# Patient Record
Sex: Male | Born: 1945
Health system: Southern US, Community
[De-identification: ages and names within clinical notes are randomized; demographics above are authoritative.]

## PROBLEM LIST (undated history)

## (undated) DIAGNOSIS — K922 Gastrointestinal hemorrhage, unspecified: Secondary | ICD-10-CM

## (undated) DIAGNOSIS — I10 Essential (primary) hypertension: Secondary | ICD-10-CM

## (undated) DIAGNOSIS — K7460 Unspecified cirrhosis of liver: Secondary | ICD-10-CM

## (undated) DIAGNOSIS — T7840XA Allergy, unspecified, initial encounter: Secondary | ICD-10-CM

## (undated) DIAGNOSIS — E119 Type 2 diabetes mellitus without complications: Secondary | ICD-10-CM

## (undated) DIAGNOSIS — D649 Anemia, unspecified: Secondary | ICD-10-CM

## (undated) DIAGNOSIS — Z5189 Encounter for other specified aftercare: Secondary | ICD-10-CM

## (undated) DIAGNOSIS — E7800 Pure hypercholesterolemia, unspecified: Secondary | ICD-10-CM

## (undated) DIAGNOSIS — K219 Gastro-esophageal reflux disease without esophagitis: Secondary | ICD-10-CM

## (undated) DIAGNOSIS — I480 Paroxysmal atrial fibrillation: Secondary | ICD-10-CM

## (undated) HISTORY — DX: Allergy, unspecified, initial encounter: T78.40XA

## (undated) HISTORY — DX: Paroxysmal atrial fibrillation: I48.0

## (undated) HISTORY — DX: Unspecified cirrhosis of liver: K74.60

## (undated) HISTORY — DX: Gastrointestinal hemorrhage, unspecified: K92.2

## (undated) HISTORY — DX: Anemia, unspecified: D64.9

## (undated) HISTORY — DX: Encounter for other specified aftercare: Z51.89

## (undated) HISTORY — PX: CHOLECYSTECTOMY: SHX55

## (undated) HISTORY — DX: Essential (primary) hypertension: I10

---

## 1998-03-01 ENCOUNTER — Emergency Department (HOSPITAL_COMMUNITY): Admission: EM | Admit: 1998-03-01 | Discharge: 1998-03-01 | Payer: Self-pay | Admitting: Family Medicine

## 2001-11-16 ENCOUNTER — Encounter: Payer: Self-pay | Admitting: Family Medicine

## 2001-11-16 ENCOUNTER — Ambulatory Visit (HOSPITAL_COMMUNITY): Admission: RE | Admit: 2001-11-16 | Discharge: 2001-11-16 | Payer: Self-pay | Admitting: Family Medicine

## 2001-12-14 ENCOUNTER — Ambulatory Visit (HOSPITAL_COMMUNITY): Admission: RE | Admit: 2001-12-14 | Discharge: 2001-12-14 | Payer: Self-pay | Admitting: Internal Medicine

## 2004-11-08 ENCOUNTER — Ambulatory Visit: Payer: Self-pay | Admitting: Orthopedic Surgery

## 2004-11-08 ENCOUNTER — Emergency Department (HOSPITAL_COMMUNITY): Admission: EM | Admit: 2004-11-08 | Discharge: 2004-11-08 | Payer: Self-pay | Admitting: Emergency Medicine

## 2004-11-13 ENCOUNTER — Ambulatory Visit: Payer: Self-pay | Admitting: Orthopedic Surgery

## 2004-11-20 ENCOUNTER — Ambulatory Visit: Payer: Self-pay | Admitting: Orthopedic Surgery

## 2004-12-04 ENCOUNTER — Ambulatory Visit: Payer: Self-pay | Admitting: Orthopedic Surgery

## 2005-08-28 ENCOUNTER — Emergency Department (HOSPITAL_COMMUNITY): Admission: EM | Admit: 2005-08-28 | Discharge: 2005-08-28 | Payer: Self-pay | Admitting: Emergency Medicine

## 2008-08-24 ENCOUNTER — Encounter: Payer: Self-pay | Admitting: Internal Medicine

## 2013-05-02 ENCOUNTER — Other Ambulatory Visit: Payer: Self-pay

## 2013-05-02 ENCOUNTER — Telehealth: Payer: Self-pay

## 2013-05-02 DIAGNOSIS — Z1211 Encounter for screening for malignant neoplasm of colon: Secondary | ICD-10-CM

## 2013-05-03 ENCOUNTER — Encounter (HOSPITAL_COMMUNITY): Payer: Self-pay | Admitting: Pharmacy Technician

## 2013-05-03 NOTE — Telephone Encounter (Signed)
Day of prep. Metformin 500mg  BID. Ok to schedule.

## 2013-05-03 NOTE — Telephone Encounter (Signed)
Gastroenterology Pre-Procedure Review  Request Date: 05/02/2013 Requesting Physician: Dr. Phillips Odor  PATIENT REVIEW QUESTIONS: The patient responded to the following health history questions as indicated:    1. Diabetes Melitis: YES 2. Joint replacements in the past 12 months: no 3. Major health problems in the past 3 months: no 4. Has an artificial valve or MVP: no 5. Has a defibrillator: no 6. Has been advised in past to take antibiotics in advance of a procedure like teeth cleaning: no    MEDICATIONS & ALLERGIES:    Patient reports the following regarding taking any blood thinners:   Plavix? no Aspirin? YES Coumadin? no  Patient confirms/reports the following medications:  Current Outpatient Prescriptions  Medication Sig Dispense Refill  . amLODipine-benazepril (LOTREL) 10-20 MG per capsule Take 1 capsule by mouth daily.      Marland Kitchen aspirin 81 MG tablet Take 81 mg by mouth daily.      Marland Kitchen CINNAMON PO Take by mouth.      . metFORMIN (GLUCOPHAGE) 500 MG tablet Take 1,000 mg by mouth 2 (two) times daily with a meal.      . Multiple Vitamin (MULTIVITAMIN) capsule Take 1 capsule by mouth daily.      . NON FORMULARY Omega red    Once daily      . omeprazole (PRILOSEC) 20 MG capsule Take 20 mg by mouth daily.      . simvastatin (ZOCOR) 40 MG tablet Take 40 mg by mouth every evening.       No current facility-administered medications for this visit.    Patient confirms/reports the following allergies:  No Known Allergies  No orders of the defined types were placed in this encounter.    AUTHORIZATION INFORMATION Primary Insurance:   ID #:   Group #:  Pre-Cert / Auth required:  Pre-Cert / Auth #:   Secondary Insurance:   ID #:   Group #:  Pre-Cert / Auth required:  Pre-Cert / Auth #:   SCHEDULE INFORMATION: Procedure has been scheduled as follows:  Date: 05/13/2013      Time:  8:30 AM Location: [facility] Short Stay  This Gastroenterology Pre-Precedure Review Form is  being routed to the following provider(s): R. Roetta Sessions, MD

## 2013-05-04 MED ORDER — PEG-KCL-NACL-NASULF-NA ASC-C 100 G PO SOLR: 1.0000 | ORAL | Status: DC

## 2013-05-04 NOTE — Telephone Encounter (Signed)
Rx sent to the pharmacy and instructions mailed to pt.  

## 2013-05-13 ENCOUNTER — Encounter (HOSPITAL_COMMUNITY)
Admission: RE | Disposition: A | Discharge status: Home / Self Care | Payer: Self-pay | Source: Ambulatory Visit | Attending: Internal Medicine

## 2013-05-13 ENCOUNTER — Ambulatory Visit (HOSPITAL_COMMUNITY)
Admission: RE | Admit: 2013-05-13 | Discharge: 2013-05-13 | Disposition: A | Discharge status: Home / Self Care | Payer: Medicare Other | Source: Ambulatory Visit | Attending: Internal Medicine | Admitting: Internal Medicine

## 2013-05-13 ENCOUNTER — Encounter (HOSPITAL_COMMUNITY): Payer: Self-pay | Admitting: *Deleted

## 2013-05-13 DIAGNOSIS — E119 Type 2 diabetes mellitus without complications: Secondary | ICD-10-CM | POA: Insufficient documentation

## 2013-05-13 DIAGNOSIS — Z1211 Encounter for screening for malignant neoplasm of colon: Secondary | ICD-10-CM

## 2013-05-13 DIAGNOSIS — Z01812 Encounter for preprocedural laboratory examination: Secondary | ICD-10-CM | POA: Insufficient documentation

## 2013-05-13 DIAGNOSIS — I10 Essential (primary) hypertension: Secondary | ICD-10-CM | POA: Insufficient documentation

## 2013-05-13 HISTORY — DX: Pure hypercholesterolemia, unspecified: E78.00

## 2013-05-13 HISTORY — PX: COLONOSCOPY: SHX5424

## 2013-05-13 HISTORY — DX: Gastro-esophageal reflux disease without esophagitis: K21.9

## 2013-05-13 SURGERY — COLONOSCOPY
Anesthesia: Moderate Sedation

## 2013-05-13 MED ORDER — ONDANSETRON HCL 4 MG/2ML IJ SOLN
INTRAMUSCULAR | Status: DC | PRN
  Administered 2013-05-13: 4 mg via INTRAVENOUS

## 2013-05-13 MED ORDER — SODIUM CHLORIDE 0.9 % IV SOLN
INTRAVENOUS | Status: DC
  Administered 2013-05-13: 08:00:00 via INTRAVENOUS

## 2013-05-13 MED ORDER — ONDANSETRON HCL 4 MG/2ML IJ SOLN
INTRAMUSCULAR | Status: AC
  Filled 2013-05-13: qty 2

## 2013-05-13 MED ORDER — MEPERIDINE HCL 100 MG/ML IJ SOLN
INTRAMUSCULAR | Status: DC | PRN
  Administered 2013-05-13: 50 mg via INTRAVENOUS
  Administered 2013-05-13: 25 mg via INTRAVENOUS
  Administered 2013-05-13: 50 mg via INTRAVENOUS

## 2013-05-13 MED ORDER — MEPERIDINE HCL 100 MG/ML IJ SOLN
INTRAMUSCULAR | Status: AC
  Filled 2013-05-13: qty 2

## 2013-05-13 MED ORDER — MIDAZOLAM HCL 5 MG/5ML IJ SOLN
INTRAMUSCULAR | Status: DC | PRN
  Administered 2013-05-13: 1 mg via INTRAVENOUS
  Administered 2013-05-13 (×2): 2 mg via INTRAVENOUS

## 2013-05-13 MED ORDER — SIMETHICONE 40 MG/0.6ML PO SUSP
ORAL | Status: DC | PRN
  Administered 2013-05-13: 08:00:00

## 2013-05-13 MED ORDER — MIDAZOLAM HCL 5 MG/5ML IJ SOLN
INTRAMUSCULAR | Status: AC
  Filled 2013-05-13: qty 10

## 2013-05-13 NOTE — H&P (Signed)
Primary Care Physician:  Cassell Smiles., MD Primary Gastroenterologist:  Dr. Jena Gauss  Pre-Procedure History & Physical: HPI:  Derek Drake is a 67 y.o. male is here for a screening colonoscopy. Last colonoscopy-negative reportedly 10 years ago. No bowel symptoms. In contradistinction to what is noted in the past medical history, patient does not have a family history of colon cancer. His brother had prostate and not colon cancer as patient reports.  Past Medical History  Diagnosis Date  . GERD (gastroesophageal reflux disease)   . Hypertension   . Hypercholesteremia   . Diabetes mellitus without complication     Past Surgical History  Procedure Laterality Date  . Cholecystectomy      Prior to Admission medications   Medication Sig Start Date End Date Taking? Authorizing Provider  amLODipine-benazepril (LOTREL) 10-20 MG per capsule Take 1 capsule by mouth daily.   Yes Historical Provider, MD  aspirin 81 MG tablet Take 81 mg by mouth daily.   Yes Historical Provider, MD  CINNAMON PO Take 1 capsule by mouth daily.    Yes Historical Provider, MD  metFORMIN (GLUCOPHAGE) 500 MG tablet Take 1,000 mg by mouth 2 (two) times daily with a meal.   Yes Historical Provider, MD  Multiple Vitamin (MULTIVITAMIN) capsule Take 1 capsule by mouth daily.   Yes Historical Provider, MD  NON FORMULARY Omega red    Once daily   Yes Historical Provider, MD  omeprazole (PRILOSEC) 20 MG capsule Take 20 mg by mouth daily.   Yes Historical Provider, MD  peg 3350 powder (MOVIPREP) 100 G SOLR Take 1 kit (200 g total) by mouth as directed. 05/04/13  Yes Corbin Ade, MD  simvastatin (ZOCOR) 40 MG tablet Take 40 mg by mouth every evening.   Yes Historical Provider, MD    Allergies as of 05/02/2013  . (No Known Allergies)    Family History  Problem Relation Age of Onset  . Colon cancer Brother     History   Social History  . Marital Status: Married    Spouse Name: N/A    Number of Children: N/A  .  Years of Education: N/A   Occupational History  . Not on file.   Social History Main Topics  . Smoking status: Former Smoker -- 2.00 packs/day for 10 years    Types: Cigarettes  . Smokeless tobacco: Not on file  . Alcohol Use: Yes     Comment: Occasional  . Drug Use: No  . Sexual Activity: Not on file   Other Topics Concern  . Not on file   Social History Narrative  . No narrative on file    Review of Systems: See HPI, otherwise negative ROS  Physical Exam: BP 169/82  Pulse 78  Temp(Src) 97.6 F (36.4 C) (Oral)  Resp 12  Ht 5\' 11"  (1.803 m)  Wt 200 lb (90.719 kg)  BMI 27.91 kg/m2  SpO2 97% General:   Alert,  Well-developed, well-nourished, pleasant and cooperative in NAD Head:  Normocephalic and atraumatic. Eyes:  Sclera clear, no icterus.   Conjunctiva pink. Ears:  Normal auditory acuity. Nose:  No deformity, discharge,  or lesions. Mouth:  No deformity or lesions, dentition normal. Neck:  Supple; no masses or thyromegaly. Lungs:  Clear throughout to auscultation.   No wheezes, crackles, or rhonchi. No acute distress. Heart:  Regular rate and rhythm; no murmurs, clicks, rubs,  or gallops. Abdomen:  Soft, nontender and nondistended. No masses, hepatosplenomegaly or hernias noted. Normal bowel sounds, without  guarding, and without rebound.   Msk:  Symmetrical without gross deformities. Normal posture. Pulses:  Normal pulses noted. Extremities:  Without clubbing or edema. Neurologic:  Alert and  oriented x4;  grossly normal neurologically. Skin:  Intact without significant lesions or rashes. Cervical Nodes:  No significant cervical adenopathy. Psych:  Alert and cooperative. Normal mood and affect.  Impression/Plan: [name] is now here to undergo a screening colonoscopy.  Average risk screening examination.  Risks, benefits, limitations, imponderables and alternatives regarding colonoscopy have been reviewed with the patient. Questions have been answered. All  parties agreeable.

## 2013-05-13 NOTE — Op Note (Signed)
[facility] [address] [place], [address]   COLONOSCOPY PROCEDURE REPORT  PATIENT: Derek Drake, Derek Drake  MR#:         [id number] BIRTHDATE: 06-30-1946 , 67  yrs. old GENDER: Male ENDOSCOPIST: R.  Roetta Sessions, MD FACP FACG REFERRED BY:  Assunta Found, M.D. PROCEDURE DATE:  05/13/2013 PROCEDURE:     Ileocolonoscopy-screening  INDICATIONS: Average risk colorectal cancer screening examination  INFORMED CONSENT:  The risks, benefits, alternatives and imponderables including but not limited to bleeding, perforation as well as the possibility of a missed lesion have been reviewed.  The potential for biopsy, lesion removal, etc. have also been discussed.  Questions have been answered.  All parties agreeable. Please see the history and physical in the medical record for more information.  MEDICATIONS: Versed 5 mg IV and Demerol 125 mg IV in divided doses. Zofran 4 mg IV.  DESCRIPTION OF PROCEDURE:  After a digital rectal exam was performed, the EC-3890Li ([id number])  colonoscope was advanced from the anus through the rectum and colon to the area of the cecum, ileocecal valve and appendiceal orifice.  The cecum was deeply intubated.  These structures were well-seen and photographed for the record.  From the level of the cecum and ileocecal valve, the scope was slowly and cautiously withdrawn.  The mucosal surfaces were carefully surveyed utilizing scope tip deflection to facilitate fold flattening as needed.  The scope was pulled down into the rectum where a thorough examination including retroflexion was performed.    FINDINGS:  Adequate preparation. Normal rectum. Normal-appearing colonic mucosa. Normal distal 5 cm of terminal ileal mucosa.  THERAPEUTIC / DIAGNOSTIC MANEUVERS PERFORMED:  None  COMPLICATIONS: none  CECAL WITHDRAWAL TIME:  10 minutes  IMPRESSION:  Normal ileocolonoscopy  RECOMMENDATIONS: Consider one more screening colonoscopy in 10 years if  overall health permits.   _______________________________ eSigned:  R. Roetta Sessions, MD FACP [facility] 05/13/2013 9:05 AM   CC:

## 2013-05-17 ENCOUNTER — Encounter (HOSPITAL_COMMUNITY): Payer: Self-pay | Admitting: Internal Medicine

## 2014-05-04 ENCOUNTER — Encounter (HOSPITAL_COMMUNITY): Payer: Self-pay | Admitting: Emergency Medicine

## 2014-05-04 ENCOUNTER — Inpatient Hospital Stay (HOSPITAL_COMMUNITY)
Admission: EM | Admit: 2014-05-04 | Discharge: 2014-05-05 | DRG: 310 | Disposition: A | Discharge status: Home / Self Care | Payer: Medicare Other | Attending: Family Medicine | Admitting: Family Medicine

## 2014-05-04 ENCOUNTER — Emergency Department (HOSPITAL_COMMUNITY): Payer: Medicare Other

## 2014-05-04 DIAGNOSIS — I4891 Unspecified atrial fibrillation: Secondary | ICD-10-CM | POA: Diagnosis present

## 2014-05-04 DIAGNOSIS — E7800 Pure hypercholesterolemia, unspecified: Secondary | ICD-10-CM | POA: Diagnosis present

## 2014-05-04 DIAGNOSIS — E119 Type 2 diabetes mellitus without complications: Secondary | ICD-10-CM | POA: Diagnosis present

## 2014-05-04 DIAGNOSIS — Z87891 Personal history of nicotine dependence: Secondary | ICD-10-CM | POA: Diagnosis not present

## 2014-05-04 DIAGNOSIS — K3189 Other diseases of stomach and duodenum: Secondary | ICD-10-CM | POA: Diagnosis present

## 2014-05-04 DIAGNOSIS — R079 Chest pain, unspecified: Secondary | ICD-10-CM

## 2014-05-04 DIAGNOSIS — K219 Gastro-esophageal reflux disease without esophagitis: Secondary | ICD-10-CM | POA: Diagnosis present

## 2014-05-04 DIAGNOSIS — Z8042 Family history of malignant neoplasm of prostate: Secondary | ICD-10-CM | POA: Diagnosis not present

## 2014-05-04 DIAGNOSIS — I480 Paroxysmal atrial fibrillation: Secondary | ICD-10-CM

## 2014-05-04 DIAGNOSIS — D696 Thrombocytopenia, unspecified: Secondary | ICD-10-CM

## 2014-05-04 DIAGNOSIS — R7401 Elevation of levels of liver transaminase levels: Secondary | ICD-10-CM

## 2014-05-04 DIAGNOSIS — Z7982 Long term (current) use of aspirin: Secondary | ICD-10-CM | POA: Diagnosis not present

## 2014-05-04 DIAGNOSIS — R740 Nonspecific elevation of levels of transaminase and lactic acid dehydrogenase [LDH]: Secondary | ICD-10-CM

## 2014-05-04 DIAGNOSIS — R7402 Elevation of levels of lactic acid dehydrogenase (LDH): Secondary | ICD-10-CM

## 2014-05-04 DIAGNOSIS — E1169 Type 2 diabetes mellitus with other specified complication: Secondary | ICD-10-CM

## 2014-05-04 DIAGNOSIS — E785 Hyperlipidemia, unspecified: Secondary | ICD-10-CM | POA: Diagnosis present

## 2014-05-04 DIAGNOSIS — F411 Generalized anxiety disorder: Secondary | ICD-10-CM | POA: Diagnosis present

## 2014-05-04 DIAGNOSIS — I10 Essential (primary) hypertension: Secondary | ICD-10-CM | POA: Diagnosis present

## 2014-05-04 DIAGNOSIS — R1013 Epigastric pain: Secondary | ICD-10-CM

## 2014-05-04 HISTORY — DX: Type 2 diabetes mellitus without complications: E11.9

## 2014-05-04 HISTORY — DX: Paroxysmal atrial fibrillation: I48.0

## 2014-05-04 HISTORY — DX: Essential (primary) hypertension: I10

## 2014-05-04 LAB — LIPASE, BLOOD: Lipase: 35 U/L (ref 11–59)

## 2014-05-04 LAB — CBC WITH DIFFERENTIAL/PLATELET
Basophils Absolute: 0 10*3/uL (ref 0.0–0.1)
Basophils Relative: 1 % (ref 0–1)
EOS ABS: 0.2 10*3/uL (ref 0.0–0.7)
Eosinophils Relative: 3 % (ref 0–5)
HCT: 43.2 % (ref 39.0–52.0)
Hemoglobin: 15.4 g/dL (ref 13.0–17.0)
LYMPHS ABS: 1.7 10*3/uL (ref 0.7–4.0)
LYMPHS PCT: 24 % (ref 12–46)
MCH: 29.6 pg (ref 26.0–34.0)
MCHC: 35.6 g/dL (ref 30.0–36.0)
MCV: 83.1 fL (ref 78.0–100.0)
Monocytes Absolute: 0.7 10*3/uL (ref 0.1–1.0)
Monocytes Relative: 9 % (ref 3–12)
NEUTROS ABS: 4.6 10*3/uL (ref 1.7–7.7)
NEUTROS PCT: 64 % (ref 43–77)
PLATELETS: 119 10*3/uL — AB (ref 150–400)
RBC: 5.2 MIL/uL (ref 4.22–5.81)
RDW: 13.5 % (ref 11.5–15.5)
WBC: 7.2 10*3/uL (ref 4.0–10.5)

## 2014-05-04 LAB — COMPREHENSIVE METABOLIC PANEL
ALBUMIN: 4.9 g/dL (ref 3.5–5.2)
ALK PHOS: 77 U/L (ref 39–117)
ALT: 65 U/L — AB (ref 0–53)
AST: 48 U/L — ABNORMAL HIGH (ref 0–37)
Anion gap: 16 — ABNORMAL HIGH (ref 5–15)
BUN: 12 mg/dL (ref 6–23)
CHLORIDE: 100 meq/L (ref 96–112)
CO2: 25 mEq/L (ref 19–32)
Calcium: 10 mg/dL (ref 8.4–10.5)
Creatinine, Ser: 0.87 mg/dL (ref 0.50–1.35)
GFR calc non Af Amer: 87 mL/min — ABNORMAL LOW (ref >90)
GLUCOSE: 195 mg/dL — AB (ref 70–99)
POTASSIUM: 4 meq/L (ref 3.7–5.3)
SODIUM: 141 meq/L (ref 137–147)
TOTAL PROTEIN: 8.1 g/dL (ref 6.0–8.3)
Total Bilirubin: 0.6 mg/dL (ref 0.3–1.2)

## 2014-05-04 LAB — GLUCOSE, CAPILLARY
GLUCOSE-CAPILLARY: 226 mg/dL — AB (ref 70–99)
Glucose-Capillary: 169 mg/dL — ABNORMAL HIGH (ref 70–99)
Glucose-Capillary: 203 mg/dL — ABNORMAL HIGH (ref 70–99)

## 2014-05-04 LAB — PROTIME-INR
INR: 1.01 (ref 0.00–1.49)
PROTHROMBIN TIME: 13.3 s (ref 11.6–15.2)

## 2014-05-04 LAB — MRSA PCR SCREENING: MRSA by PCR: NEGATIVE

## 2014-05-04 LAB — TROPONIN I: Troponin I: <0.3 ng/mL (ref <0.30)

## 2014-05-04 MED ORDER — ACETAMINOPHEN 325 MG PO TABS: 650.0000 mg | ORAL_TABLET | Freq: Four times a day (QID) | ORAL | Status: DC | PRN

## 2014-05-04 MED ORDER — INSULIN ASPART 100 UNIT/ML ~~LOC~~ SOLN
0.0000 [IU] | Freq: Three times a day (TID) | SUBCUTANEOUS | Status: DC
  Administered 2014-05-04: 2 [IU] via SUBCUTANEOUS
  Administered 2014-05-04: 3 [IU] via SUBCUTANEOUS
  Administered 2014-05-05: 2 [IU] via SUBCUTANEOUS
  Administered 2014-05-05: 5 [IU] via SUBCUTANEOUS
  Administered 2014-05-05: 3 [IU] via SUBCUTANEOUS

## 2014-05-04 MED ORDER — DILTIAZEM LOAD VIA INFUSION
10.0000 mg | Freq: Once | INTRAVENOUS | Status: AC
  Administered 2014-05-04: 10 mg via INTRAVENOUS
  Filled 2014-05-04: qty 10

## 2014-05-04 MED ORDER — ATORVASTATIN CALCIUM 20 MG PO TABS
20.0000 mg | ORAL_TABLET | Freq: Every day | ORAL | Status: DC
  Administered 2014-05-04 – 2014-05-05 (×2): 20 mg via ORAL
  Filled 2014-05-04 (×2): qty 1

## 2014-05-04 MED ORDER — SODIUM CHLORIDE 0.9 % IJ SOLN
3.0000 mL | Freq: Two times a day (BID) | INTRAMUSCULAR | Status: DC
  Administered 2014-05-04 – 2014-05-05 (×2): 3 mL via INTRAVENOUS

## 2014-05-04 MED ORDER — ONDANSETRON HCL 4 MG PO TABS: 4.0000 mg | ORAL_TABLET | Freq: Four times a day (QID) | ORAL | Status: DC | PRN

## 2014-05-04 MED ORDER — ONDANSETRON HCL 4 MG/2ML IJ SOLN: 4.0000 mg | Freq: Four times a day (QID) | INTRAMUSCULAR | Status: DC | PRN

## 2014-05-04 MED ORDER — ENOXAPARIN SODIUM 100 MG/ML ~~LOC~~ SOLN
1.0000 mg/kg | Freq: Two times a day (BID) | SUBCUTANEOUS | Status: DC
  Administered 2014-05-04 – 2014-05-05 (×3): 90 mg via SUBCUTANEOUS
  Filled 2014-05-04 (×3): qty 1

## 2014-05-04 MED ORDER — ASPIRIN EC 81 MG PO TBEC
81.0000 mg | DELAYED_RELEASE_TABLET | Freq: Every day | ORAL | Status: DC
  Administered 2014-05-05: 81 mg via ORAL
  Filled 2014-05-04: qty 1

## 2014-05-04 MED ORDER — PANTOPRAZOLE SODIUM 40 MG PO TBEC
40.0000 mg | DELAYED_RELEASE_TABLET | Freq: Every day | ORAL | Status: DC
  Administered 2014-05-04 – 2014-05-05 (×2): 40 mg via ORAL
  Filled 2014-05-04 (×2): qty 1

## 2014-05-04 MED ORDER — ALUM & MAG HYDROXIDE-SIMETH 200-200-20 MG/5ML PO SUSP: 30.0000 mL | Freq: Four times a day (QID) | ORAL | Status: DC | PRN

## 2014-05-04 MED ORDER — SIMVASTATIN 20 MG PO TABS: 40.0000 mg | ORAL_TABLET | Freq: Every evening | ORAL | Status: DC

## 2014-05-04 MED ORDER — GI COCKTAIL ~~LOC~~
30.0000 mL | Freq: Once | ORAL | Status: AC
  Administered 2014-05-04: 30 mL via ORAL
  Filled 2014-05-04: qty 30

## 2014-05-04 MED ORDER — DEXTROSE 5 % IV SOLN
5.0000 mg/h | INTRAVENOUS | Status: DC
  Administered 2014-05-04: 20 mg/h via INTRAVENOUS
  Administered 2014-05-04: 5 mg/h via INTRAVENOUS
  Administered 2014-05-04 – 2014-05-05 (×3): 20 mg/h via INTRAVENOUS
  Filled 2014-05-04: qty 100

## 2014-05-04 MED ORDER — ACETAMINOPHEN 650 MG RE SUPP
650.0000 mg | Freq: Four times a day (QID) | RECTAL | Status: DC | PRN
Start: 2014-05-04 — End: 2014-05-05

## 2014-05-04 MED ORDER — ASPIRIN 81 MG PO CHEW
324.0000 mg | CHEWABLE_TABLET | Freq: Once | ORAL | Status: AC
  Administered 2014-05-04: 324 mg via ORAL
  Filled 2014-05-04: qty 4

## 2014-05-04 NOTE — ED Notes (Signed)
Pt denies any pain. Pt states he feels like himself except a lot of burping. NAD. Non diaphoretic. HR in 160s AF. Pt is asymptomatic.

## 2014-05-04 NOTE — Progress Notes (Signed)
UR chart review completed.  

## 2014-05-04 NOTE — H&P (Signed)
History and Physical  Derek Drake ZGY:[id number] DOB: 1946-03-06 DOA: 05/04/2014  Referring physician: Dr. Ezequiel Essex in ED PCP: [name]., MD   Chief Complaint: burping  HPI:  68 year old male with history of diabetes and hypertension who came to the emergency department for increased belching, anxiety. Found to have atrial fibrillation with rapid ventricular response.  He has a history of "heart skipping a beat" but no diagnosed cardiac issues. No recent chest pain or shortness of breath. He has chronic belching from GERD. If help in yes last night this morning 4 a.m. he had severe belching and feeling of anxiety which prompted him to seek treatment. In the emergency department he was treated with GI cocktail and aspirin. He now feels "wonderful". No chest pain or shortness of breath. No radiation to the left arm neck or jaw. No diaphoresis. No vomiting.  In the emergency department afebrile. Heart rate up to 160s. Normotensive. No hypoxia. Treated with aspirin, GI cocktail, diltiazem infusion. Laboratory studies notable for mild elevation of transaminases, platelet count 119. Troponin negative. EKG showed atrial fibrillation with rapid ventricular response with nonspecific ST changes. Independently interpreted. Chest x-ray no acute disease, independently interpreted.  Review of Systems:  Negative for fever, visual changes, sore throat, rash, new muscle aches, chest pain, SOB, dysuria, bleeding, n/v/abdominal pain.  Past Medical History  Diagnosis Date  . GERD (gastroesophageal reflux disease)   . Hypertension   . Hypercholesteremia   . Diabetes mellitus without complication     Past Surgical History  Procedure Laterality Date  . Cholecystectomy    . Colonoscopy N/A 05/13/2013    Procedure: COLONOSCOPY;  Surgeon: Daneil Dolin, MD;  Location: [facility];  Service: Endoscopy;  Laterality: N/A;  8:30 AM    Social History:  reports that he has quit smoking. His  smoking use included Cigarettes. He has a 20 pack-year smoking history. He does not have any smokeless tobacco history on file. He reports that he drinks alcohol. He reports that he does not use illicit drugs.  Allergies  Allergen Reactions  . Rabbit Protein Swelling    Face swelled up  . Other     Dial soap    Family History  Problem Relation Age of Onset  . Prostate cancer Brother   . Colon cancer Neg Hx      Prior to Admission medications   Medication Sig Start Date End Date Taking? Authorizing Provider  amLODipine-benazepril (LOTREL) 10-20 MG per capsule Take 1 capsule by mouth daily.   Yes Historical Provider, MD  aspirin 81 MG tablet Take 81 mg by mouth daily.   Yes Historical Provider, MD  CINNAMON PO Take 1 capsule by mouth daily.    Yes Historical Provider, MD  metFORMIN (GLUCOPHAGE) 500 MG tablet Take 1,000 mg by mouth 2 (two) times daily with a meal.   Yes Historical Provider, MD  Multiple Vitamin (MULTIVITAMIN) capsule Take 1 capsule by mouth daily.   Yes Historical Provider, MD  NON FORMULARY Omega red    Once daily   Yes Historical Provider, MD  omeprazole (PRILOSEC) 20 MG capsule Take 20 mg by mouth daily.   Yes Historical Provider, MD  simvastatin (ZOCOR) 40 MG tablet Take 40 mg by mouth every evening.   Yes Historical Provider, MD  peg 3350 powder (MOVIPREP) 100 G SOLR Take 1 kit (200 g total) by mouth as directed. 05/04/13   Daneil Dolin, MD   Physical Exam: Filed Vitals:   05/04/14 5916 05/04/14  0630 05/04/14 0640 05/04/14 0730  BP: 143/83 93/76 118/94 107/85  Pulse: 180 164 164 147  Temp: 98 F (36.7 C)     TempSrc: Oral     Resp: _0 Height: _1  (1.803 m)     Weight: 87.544 kg (193 lb)     SpO2: 100% 96% 94% 95%    General: Examined in the emergency department. Appears calm and comfortable. Well-appearing. Eyes: PERRL, normal lids, irises ENT: grossly normal hearing, lips & tongue Neck: no LAD, masses or thyromegaly Cardiovascular:  Tachycardic, regular. No murmur, rub or gallop. No LE edema. Respiratory: CTA bilaterally, no w/r/r. Normal respiratory effort. Abdomen: soft, ntnd Skin: no rash or induration seen  Musculoskeletal: grossly normal tone BUE/BLE Psychiatric: grossly normal mood and affect, speech fluent and appropriate Neurologic: grossly non-focal.  Wt Readings from Last 3 Encounters:  05/04/14 87.544 kg (193 lb)  05/13/13 90.719 kg (200 lb)  05/13/13 90.719 kg (200 lb)    Labs on Admission:  Basic Metabolic Panel:  Recent Labs Lab 05/04/14 0620  NA 141  K 4.0  CL 100  CO2 25  GLUCOSE 195*  BUN 12  CREATININE 0.87  CALCIUM 10.0    Liver Function Tests:  Recent Labs Lab 05/04/14 0620  AST 48*  ALT 65*  ALKPHOS 77  BILITOT 0.6  PROT 8.1  ALBUMIN 4.9    Recent Labs Lab 05/04/14 0620  LIPASE 35    CBC:  Recent Labs Lab 05/04/14 0620  WBC 7.2  NEUTROABS 4.6  HGB 15.4  HCT 43.2  MCV 83.1  PLT 119*    Cardiac Enzymes:  Recent Labs Lab 05/04/14 0620  TROPONINI <0.30      Radiological Exams on Admission: Dg Chest Portable 1 View  05/04/2014   CLINICAL DATA:  Chest pain.  EXAM: PORTABLE CHEST - 1 VIEW  COMPARISON:  None.  FINDINGS: Lungs are clear. Heart size is upper normal. There is no evidence pulmonary edema. No pneumothorax or pleural effusion.  IMPRESSION: No acute disease.   Electronically Signed   By: Inge Rise M.D.   On: 05/04/2014 07:19     Principal Problem:   Atrial fibrillation with rapid ventricular response Active Problems:   Thrombocytopenia   Transaminitis   DM type 2 (diabetes mellitus, type 2)   Hypertension   Assessment/Plan 1. Atrial fibrillation with rapid ventricular response. Hemodynamically stable. Etiology unclear, no signs or symptoms of ACS. Workup as below. 2. Mild thrombocytopenia, significance and chronicity unclear. 3. Mild transaminitis, unclear etiology, possibly statin. 4. Diabetes mellitus type  2. 5. Hypertension. 6. Hyperlipidemia.   Although heart rate remains elevated he is hemodynamically stable and appears well. Plan admission to step down unit on IV diltiazem infusion, the duration unclear as the patient has frequent belching and had no sense of palpitations.  2-D echocardiogram, TSH, serial troponin. Plan rate control.  CBC, CMP in AM  SSI  Code Status: full code  DVT prophylaxis:Lovenox Family Communication: discussed with wife at bedside Disposition Plan/Anticipated LOS: admit 2 days  Time spent: 18 minutes  Murray Hodgkins, MD  [facility] Pager [phone] 05/04/2014, 8:04 AM

## 2014-05-04 NOTE — ED Notes (Signed)
Hr slightly better in 150s/140s.

## 2014-05-04 NOTE — ED Notes (Signed)
VO to increase cardizem to 20mg /hr per Dr Wyvonnia Dusky, read back and verified.

## 2014-05-04 NOTE — ED Provider Notes (Signed)
CSN: [id number]     Arrival date & time 05/04/14  0606 History   First MD Initiated Contact with Patient 05/04/14 0622     Chief Complaint  Patient presents with  . Chest Pain     (Consider location/radiation/quality/duration/timing/severity/associated sxs/prior Treatment) HPI Comments: Patient presents with burning in his chest that has been intermittent since 4 AM. It has since resolved. The burning sensation lasted for several minutes at a time. It is associated with burping and nausea. He does admit to eating some jalapenos last night and has a history of acid reflux disease. On arrival he denies any chest pain, shortness of breath, nausea, vomiting, fevers or chills. He has a history of diabetes, hypertension. On arrival he is found to have an irregular tachycardia. Denies any dizziness or lightheadedness. States he has felt his heart "skipping beats" in the past. No history of atrial fibrillation.  The history is provided by the patient and a relative. The history is limited by the condition of the patient.    Past Medical History  Diagnosis Date  . GERD (gastroesophageal reflux disease)   . Hypertension   . Hypercholesteremia   . Diabetes mellitus without complication    Past Surgical History  Procedure Laterality Date  . Cholecystectomy    . Colonoscopy N/A 05/13/2013    Procedure: COLONOSCOPY;  Surgeon: Daneil Dolin, MD;  Location: [facility];  Service: Endoscopy;  Laterality: N/A;  8:30 AM   Family History  Problem Relation Age of Onset  . Prostate cancer Brother   . Colon cancer Neg Hx    History  Substance Use Topics  . Smoking status: Former Smoker -- 2.00 packs/day for 10 years    Types: Cigarettes  . Smokeless tobacco: Not on file  . Alcohol Use: Yes     Comment: Occasional    Review of Systems  Constitutional: Negative for fever, activity change and appetite change.  HENT: Negative for congestion and rhinorrhea.   Eyes: Negative for photophobia.   Respiratory: Positive for chest tightness. Negative for cough and shortness of breath.   Cardiovascular: Positive for chest pain and palpitations.  Gastrointestinal: Positive for nausea. Negative for vomiting and abdominal pain.  Genitourinary: Negative for dysuria and penile pain.  Musculoskeletal: Negative for arthralgias and myalgias.  Skin: Negative for wound.  Neurological: Negative for dizziness, weakness and headaches.  A complete 10 system review of systems was obtained and all systems are negative except as noted in the HPI and PMH.      Allergies  Rabbit protein and Other  Home Medications   Prior to Admission medications   Medication Sig Start Date End Date Taking? Authorizing Provider  amLODipine-benazepril (LOTREL) 10-20 MG per capsule Take 1 capsule by mouth daily.   Yes Historical Provider, MD  aspirin 81 MG tablet Take 81 mg by mouth daily.   Yes Historical Provider, MD  CINNAMON PO Take 1 capsule by mouth daily.    Yes Historical Provider, MD  metFORMIN (GLUCOPHAGE) 500 MG tablet Take 1,000 mg by mouth 2 (two) times daily with a meal.   Yes Historical Provider, MD  Multiple Vitamin (MULTIVITAMIN) capsule Take 1 capsule by mouth daily.   Yes Historical Provider, MD  NON FORMULARY Omega red    Once daily   Yes Historical Provider, MD  omeprazole (PRILOSEC) 20 MG capsule Take 20 mg by mouth daily.   Yes Historical Provider, MD  simvastatin (ZOCOR) 40 MG tablet Take 40 mg by mouth every evening.  Yes Historical Provider, MD  peg 3350 powder (MOVIPREP) 100 G SOLR Take 1 kit (200 g total) by mouth as directed. 05/04/13   Daneil Dolin, MD   BP 118/94  Pulse 164  Temp(Src) 98 F (36.7 C) (Oral)  Resp 16  Ht _0  (1.803 m)  Wt 193 lb (87.544 kg)  BMI 26.93 kg/m2  SpO2 94% Physical Exam  Nursing note and vitals reviewed. Constitutional: He is oriented to person, place, and time. He appears well-developed and well-nourished. No distress.  HENT:  Head:  Normocephalic and atraumatic.  Mouth/Throat: Oropharynx is clear and moist. No oropharyngeal exudate.  Eyes: Conjunctivae and EOM are normal. Pupils are equal, round, and reactive to light.  Neck: Normal range of motion. Neck supple.  No meningismus.  Cardiovascular: Normal rate, normal heart sounds and intact distal pulses.   No murmur heard. Irregular tachycardia.  Pulmonary/Chest: Effort normal and breath sounds normal. No respiratory distress.  Abdominal: Soft. There is no tenderness. There is no rebound and no guarding.  Musculoskeletal: Normal range of motion. He exhibits no edema and no tenderness.  Neurological: He is alert and oriented to person, place, and time. No cranial nerve deficit. He exhibits normal muscle tone. Coordination normal.  No ataxia on finger to nose bilaterally. No pronator drift. 5/5 strength throughout. CN 2-12 intact. Negative Romberg. Equal grip strength. Sensation intact. Gait is normal.   Skin: Skin is warm.  Psychiatric: He has a normal mood and affect. His behavior is normal.    ED Course  Procedures (including critical care time) Labs Review Labs Reviewed  CBC WITH DIFFERENTIAL - Abnormal; Notable for the following:    Platelets 119 (*)    All other components within normal limits  COMPREHENSIVE METABOLIC PANEL - Abnormal; Notable for the following:    Glucose, Bld 195 (*)    AST 48 (*)    ALT 65 (*)    GFR calc non Af Amer 87 (*)    Anion gap 16 (*)    All other components within normal limits  LIPASE, BLOOD  TROPONIN I  PROTIME-INR    Imaging Review Dg Chest Portable 1 View  05/04/2014   CLINICAL DATA:  Chest pain.  EXAM: PORTABLE CHEST - 1 VIEW  COMPARISON:  None.  FINDINGS: Lungs are clear. Heart size is upper normal. There is no evidence pulmonary edema. No pneumothorax or pleural effusion.  IMPRESSION: No acute disease.   Electronically Signed   By: Inge Rise M.D.   On: 05/04/2014 07:19     EKG Interpretation   Date/Time:   Thursday May 04 2014 06:22:57 EDT Ventricular Rate:  171 PR Interval:    QRS Duration: 90 QT Interval:  272 QTC Calculation: 458 R Axis:   21 Text Interpretation:  Atrial fibrillation with rapid ventricular response  Nonspecific ST and T wave abnormality Abnormal ECG No previous ECGs  available Confirmed by Wyvonnia Dusky  MD, Sophy Mesler ([id number]) on 05/04/2014 6:48:21  AM      MDM   Final diagnoses:  Atrial fibrillation with RVR  Chest pain, unspecified chest pain type   Burning in chest which has since resolved. Found to have atrial fibrillation with RVR.  New onset.  Patient given aspirin, GI cocktail. Started on Cardizem bolus and drip. Labs unremarkable. Chest x-ray clear. Troponin negative.  Rate remains rapid in 150 range. Rate of Cardizem drip increased from 10 mg to 15 mg. Patient denies chest pain, shortness of breath or palpitations. Blood pressure 107/83.  Step down admission discussed with Dr. Sarajane Jews.  CRITICAL CARE Performed by: Ezequiel Essex Total critical care time: 45 Critical care time was exclusive of separately billable procedures and treating other patients. Critical care was necessary to treat or prevent imminent or life-threatening deterioration. Critical care was time spent personally by me on the following activities: development of treatment plan with patient and/or surrogate as well as nursing, discussions with consultants, evaluation of patient's response to treatment, examination of patient, obtaining history from patient or surrogate, ordering and performing treatments and interventions, ordering and review of laboratory studies, ordering and review of radiographic studies, pulse oximetry and re-evaluation of patient's condition.     Ezequiel Essex, MD 05/04/14 [phone]

## 2014-05-04 NOTE — ED Notes (Signed)
Pt states he awoke approx 4 am with mid sternal chest pains, states he did eat some jalapenos last night and feels like it might be heartburn, states he has also been burping a lot.

## 2014-05-05 ENCOUNTER — Encounter (HOSPITAL_COMMUNITY): Payer: Self-pay | Admitting: Physician Assistant

## 2014-05-05 DIAGNOSIS — I10 Essential (primary) hypertension: Secondary | ICD-10-CM

## 2014-05-05 DIAGNOSIS — I059 Rheumatic mitral valve disease, unspecified: Secondary | ICD-10-CM

## 2014-05-05 LAB — CBC
HCT: 39.2 % (ref 39.0–52.0)
Hemoglobin: 13.7 g/dL (ref 13.0–17.0)
MCH: 28.9 pg (ref 26.0–34.0)
MCHC: 34.9 g/dL (ref 30.0–36.0)
MCV: 82.7 fL (ref 78.0–100.0)
PLATELETS: 119 10*3/uL — AB (ref 150–400)
RBC: 4.74 MIL/uL (ref 4.22–5.81)
RDW: 13.4 % (ref 11.5–15.5)
WBC: 9.5 10*3/uL (ref 4.0–10.5)

## 2014-05-05 LAB — GLUCOSE, CAPILLARY
GLUCOSE-CAPILLARY: 202 mg/dL — AB (ref 70–99)
Glucose-Capillary: 253 mg/dL — ABNORMAL HIGH (ref 70–99)
Glucose-Capillary: 168 mg/dL — ABNORMAL HIGH (ref 70–99)

## 2014-05-05 LAB — TROPONIN I: Troponin I: <0.3 ng/mL (ref <0.30)

## 2014-05-05 LAB — COMPREHENSIVE METABOLIC PANEL
ALT: 54 U/L — ABNORMAL HIGH (ref 0–53)
ANION GAP: 15 (ref 5–15)
AST: 38 U/L — ABNORMAL HIGH (ref 0–37)
Albumin: 4.1 g/dL (ref 3.5–5.2)
Alkaline Phosphatase: 61 U/L (ref 39–117)
BILIRUBIN TOTAL: 0.9 mg/dL (ref 0.3–1.2)
BUN: 11 mg/dL (ref 6–23)
CHLORIDE: 98 meq/L (ref 96–112)
CO2: 25 meq/L (ref 19–32)
CREATININE: 0.86 mg/dL (ref 0.50–1.35)
Calcium: 9.2 mg/dL (ref 8.4–10.5)
GFR calc Af Amer: >90 mL/min (ref >90)
GFR, EST NON AFRICAN AMERICAN: 87 mL/min — AB (ref >90)
GLUCOSE: 209 mg/dL — AB (ref 70–99)
POTASSIUM: 4.2 meq/L (ref 3.7–5.3)
Sodium: 138 mEq/L (ref 137–147)
Total Protein: 6.9 g/dL (ref 6.0–8.3)

## 2014-05-05 LAB — TSH: TSH: 2.26 u[IU]/mL (ref 0.350–4.500)

## 2014-05-05 MED ORDER — APIXABAN 5 MG PO TABS: 5.0000 mg | ORAL_TABLET | Freq: Two times a day (BID) | ORAL | Status: DC

## 2014-05-05 MED ORDER — DILTIAZEM HCL ER COATED BEADS 120 MG PO CP24
120.0000 mg | ORAL_CAPSULE | Freq: Every day | ORAL | Status: DC
  Administered 2014-05-05: 120 mg via ORAL
  Filled 2014-05-05: qty 1

## 2014-05-05 MED ORDER — DILTIAZEM HCL ER COATED BEADS 120 MG PO CP24: 120.0000 mg | ORAL_CAPSULE | Freq: Every day | ORAL | Status: DC

## 2014-05-05 MED ORDER — DILTIAZEM HCL 30 MG PO TABS
30.0000 mg | ORAL_TABLET | Freq: Four times a day (QID) | ORAL | Status: DC
  Administered 2014-05-05: 30 mg via ORAL
  Filled 2014-05-05: qty 1

## 2014-05-05 NOTE — Discharge Summary (Signed)
Physician Discharge Summary  Derek Drake:[id number] DOB: 1946/08/31 DOA: 05/04/2014  PCP: [name]., MD  Admit date: 05/04/2014 Discharge date: 05/05/2014  Recommendations for Outpatient Follow-up:  1. New diagnosis of atrial fibrillation, started on diltiazem and Eliquis 2. Lotrel discontinued with low normal BP and start of diltiazem. Consider starting ACE inhibitor if blood pressure can tolerate 3. Mild transaminitis of unclear etiology which appears to be subacute, possibly related to statin. Consider further evaluation. 4. Mild thrombocytopenia of unclear significance. Consider repeat CBC as an outpatient to followup   Follow-up Information   Follow up with [name], MD On 05/25/2014. (@ 1PM)    Specialty:  Cardiology   Contact information:   [place] [address] [phone]       Follow up with [name]., MD. Schedule an appointment as soon as possible for a visit in 2 weeks. (follow-up hospitalization)    Specialty:  Internal Medicine   Contact information:   [address] [place] [address] [phone]      Discharge Diagnoses:  1. New diagnosis atrial fibrillation with rapid ventricular response 2. Diabetes mellitus type 2 3. Hypertension 4. Hyperlipidemia 5. Mild transaminitis of unclear etiology 6. Mild thrombocytopenia of unclear etiology  Discharge Condition: Improved Disposition: Home  Diet recommendation: Heart healthy diabetic diet  Filed Weights   05/04/14 0622 05/04/14 0848  Weight: 87.544 kg (193 lb) 89.4 kg (197 lb 1.5 oz)    History of present illness:  68 year old male with history of diabetes, hypertension but no cardiac disease who presented with atrial fibrillation with rapid ventricular response, new diagnosis.  Hospital Course:  Derek Drake was treated with IV diltiazem and spontaneously converted to sinus rhythm. He has remained in sinus rhythm on oral diltiazem. Risks and benefits of  anticoagulation in the setting of atrial fibrillation with thoroughly discussed with the patient and after deliberation and consultation with cardiology he has elected to start Eliquis. We discussed no that there is no reversal agent for this medication and to take care with power tools. TSH and 2-D echocardiogram are unremarkable. Seen individual issues below.  1. New diagnosis atrial fibrillation with rapid ventricular response. Remains in sinus rhythm. CHADSVASC 3, Eliquis initiated. 2. Diabetes mellitus type 2. Stable. Resume metformin on discharge. 3. Hypertension. Stable. 4. Hyperlipidemia. 5. Mild transaminitis of unclear etiology, stable, per PCP 11/03/13 AST 34, ALT 54. Suspect statin. Consider followup as an outpatient. 6. Mild thrombocytopenia, stable. Significance unclear. Plt 156 on 11/03/13 per office.  Consultants:  Cardiology Procedures:  2-D echocardiogram: Mild LVH with LV EF 60-65%. Grade 1 diastolic dysfunction.  Discharge Instructions  Discharge Instructions   Activity as tolerated - No restrictions    Complete by:  As directed      Diet - low sodium heart healthy    Complete by:  As directed      Discharge instructions    Complete by:  As directed   Call your physician or seek immediate medical attention for palpitations, fast heart rate, shortness of breath, chest pain, bleeding or worsening of condition.            Medication List    STOP taking these medications       amLODipine-benazepril 10-20 MG per capsule  Commonly known as:  LOTREL     aspirin EC 81 MG tablet      TAKE these medications       apixaban 5 MG Tabs tablet  Commonly known as:  ELIQUIS  Take 1 tablet (5 mg total) by mouth 2 (two) times daily.     CINNAMON PO  Take 1 capsule by mouth daily.     diltiazem 120 MG 24 hr capsule  Commonly known as:  CARDIZEM CD  Take 1 capsule (120 mg total) by mouth daily. Start 8/22 in the evening     Krill Oil 1000 MG Caps  Take 1 capsule by  mouth daily.     metFORMIN 500 MG tablet  Commonly known as:  GLUCOPHAGE  Take 1,000 mg by mouth 2 (two) times daily with a meal.     multivitamin capsule  Take 1 capsule by mouth daily.     omeprazole 20 MG capsule  Commonly known as:  PRILOSEC  Take 20 mg by mouth daily.     simvastatin 40 MG tablet  Commonly known as:  ZOCOR  Take 40 mg by mouth every evening.       Allergies  Allergen Reactions  . Rabbit Protein Swelling    Face swelled up  . Other     Dial soap    The results of significant diagnostics from this hospitalization (including imaging, microbiology, ancillary and laboratory) are listed below for reference.    Significant Diagnostic Studies: Dg Chest Portable 1 View  05/04/2014   CLINICAL DATA:  Chest pain.  EXAM: PORTABLE CHEST - 1 VIEW  COMPARISON:  None.  FINDINGS: Lungs are clear. Heart size is upper normal. There is no evidence pulmonary edema. No pneumothorax or pleural effusion.  IMPRESSION: No acute disease.   Electronically Signed   By: Inge Rise M.D.   On: 05/04/2014 07:19    Microbiology: Recent Results (from the past 240 hour(s))  MRSA PCR SCREENING     Status: None   Collection Time    05/04/14  8:45 AM      Result Value Ref Range Status   MRSA by PCR NEGATIVE  NEGATIVE Final   Comment:            The GeneXpert MRSA Assay (FDA     approved for NASAL specimens     only), is one component of a     comprehensive MRSA colonization     surveillance program. It is not     intended to diagnose MRSA     infection nor to guide or     monitor treatment for     MRSA infections.     Labs: Basic Metabolic Panel:  Recent Labs Lab 05/04/14 0620 05/05/14 0425  NA 141 138  K 4.0 4.2  CL 100 98  CO2 25 25  GLUCOSE 195* 209*  BUN 12 11  CREATININE 0.87 0.86  CALCIUM 10.0 9.2   Liver Function Tests:  Recent Labs Lab 05/04/14 0620 05/05/14 0425  AST 48* 38*  ALT 65* 54*  ALKPHOS 77 61  BILITOT 0.6 0.9  PROT 8.1 6.9    ALBUMIN 4.9 4.1    Recent Labs Lab 05/04/14 0620  LIPASE 35   CBC:  Recent Labs Lab 05/04/14 0620 05/05/14 0425  WBC 7.2 9.5  NEUTROABS 4.6  --   HGB 15.4 13.7  HCT 43.2 39.2  MCV 83.1 82.7  PLT 119* 119*   Cardiac Enzymes:  Recent Labs Lab 05/04/14 0620 05/04/14 1152 05/04/14 1735 05/05/14 0006  TROPONINI <0.30 <0.30 <0.30 <0.30   CBG:  Recent Labs Lab 05/04/14 1626 05/04/14 2237 05/05/14 0720 05/05/14 1116 05/05/14 1613  GLUCAP 169* 203* 202* 253* 168*  Principal Problem:   Atrial fibrillation with rapid ventricular response Active Problems:   Thrombocytopenia   Transaminitis   DM type 2 (diabetes mellitus, type 2)   Hypertension   Time coordinating discharge: 35 minutes  Signed:  Murray Hodgkins, MD [facility] 05/05/2014, 4:56 PM

## 2014-05-05 NOTE — Consult Note (Signed)
Cardiology Consultation Note  Patient ID: Derek Drake, MRN: [id number], DOB/AGE: 68/06/1946 68 y.o. Admit date: 05/04/2014   Date of Consult: 05/05/2014 Primary Physician: [name]., MD Consulting Cardiologist: Dr. Satira Sark  Chief Complaint: Chest discomfort Reason for Consult: New AF, now in NSR  HPI: Derek Drake is a 68 y/o M with no cardiac history but a history of GERD, HTN, HL, DM, former tobacco abuse who presented to [facility] yesterday with vague chest discomfort and increased burping and was found to be in newly diagnosed rapid atrial fibrillation. He has no h/o cardiac workup but does relay a history of intermittent "acid reflux" symptoms like indigestion when he gets nervous. On Wednesday 8/20 he worked out in the hot sun pulling potatoes around 1pm. When he was finished he came inside because he felt like he could not catch his breath for several minutes. No CP with that episode. That evening he ate a meal of pintos and jalepenos. Around 4am he awoke with substernal vague chest discomfort similar to prior indigestion but worse, along with anxiety. No associated nausea, vomiting, dyspnea, awareness of palpitations, syncope or near syncope but he was burping "loud enough to hear in the parking lot." Discomfort lasted around 2 hours. He came to the ED and was found to be in rapid atrial fibrillation HR 140s-160s. He was treated with GI cocktail, aspirin and diltiazem infusion with resolution of symptoms. He converted to NSR around 1:18am and is maintaining NSR in the 80s. He currently feels "wonderful." No further CP, SOB. He is not particularly active but denies any recent exertional CP. Troponins negative x 4, TSH wnl, mild transaminitis and mild thrombocytopenia of unclear etiology. Denies hx of stroke, TIA or bleeding problems.  Past Medical History  Diagnosis Date  . GERD (gastroesophageal reflux disease)   . Essential hypertension, benign   . Hypercholesteremia   . Type 2  diabetes mellitus   . Atrial fibrillation with rapid ventricular response 05/04/2014      Most Recent Cardiac Studies: None   Surgical History:  Past Surgical History  Procedure Laterality Date  . Cholecystectomy    . Colonoscopy N/A 05/13/2013    Procedure: COLONOSCOPY;  Surgeon: Daneil Dolin, MD;  Location: [facility];  Service: Endoscopy;  Laterality: N/A;  8:30 AM     Home Meds: Prior to Admission medications   Medication Sig Start Date End Date Taking? Authorizing Provider  amLODipine-benazepril (LOTREL) 10-20 MG per capsule Take 1 capsule by mouth daily.   Yes Historical Provider, MD  aspirin EC 81 MG tablet Take 81 mg by mouth daily.   Yes Historical Provider, MD  CINNAMON PO Take 1 capsule by mouth daily.    Yes Historical Provider, MD  Derek Drake Oil 1000 MG CAPS Take 1 capsule by mouth daily.   Yes Historical Provider, MD  metFORMIN (GLUCOPHAGE) 500 MG tablet Take 1,000 mg by mouth 2 (two) times daily with a meal.   Yes Historical Provider, MD  Multiple Vitamin (MULTIVITAMIN) capsule Take 1 capsule by mouth daily.   Yes Historical Provider, MD  omeprazole (PRILOSEC) 20 MG capsule Take 20 mg by mouth daily.   Yes Historical Provider, MD  simvastatin (ZOCOR) 40 MG tablet Take 40 mg by mouth every evening.   Yes Historical Provider, MD    Inpatient Medications:  . aspirin EC  81 mg Oral Daily  . atorvastatin  20 mg Oral q1800  . diltiazem  30 mg Oral 4 times per day  .  enoxaparin (LOVENOX) injection  1 mg/kg Subcutaneous Q12H  . insulin aspart  0-9 Units Subcutaneous TID WC  . pantoprazole  40 mg Oral Daily  . sodium chloride  3 mL Intravenous Q12H      Allergies:  Allergies  Allergen Reactions  . Rabbit Protein Swelling    Face swelled up  . Other     Dial soap    History   Social History  . Marital Status: Married    Spouse Name: N/A    Number of Children: N/A  . Years of Education: N/A   Occupational History  . Not on file.   Social History Main  Topics  . Smoking status: Former Smoker -- 2.00 packs/day for 10 years    Types: Cigarettes  . Smokeless tobacco: Not on file  . Alcohol Use: Yes     Comment: Rare - 1 beer every 3 months  . Drug Use: No  . Sexual Activity: Not on file   Other Topics Concern  . Not on file   Social History Narrative  . No narrative on file     Family History  Problem Relation Age of Onset  . Prostate cancer Brother   . Colon cancer Neg Hx      Review of Systems: General: negative for chills, fever, night sweats. + some weight loss since starting metformin Cardiovascular: see above Dermatological: negative for rash Respiratory: negative for cough or wheezing Urologic: negative for hematuria Abdominal: negative for nausea, vomiting, diarrhea, bright red blood per rectum, melena, or hematemesis Neurologic: negative for visual changes, syncope, or dizziness All other systems reviewed and are otherwise negative except as noted above.  Labs:  Recent Labs  05/04/14 0620 05/04/14 1152 05/04/14 1735 05/05/14 0006  TROPONINI <0.30 <0.30 <0.30 <0.30   Lab Results  Component Value Date   WBC 9.5 05/05/2014   HGB 13.7 05/05/2014   HCT 39.2 05/05/2014   MCV 82.7 05/05/2014   PLT 119* 05/05/2014     Recent Labs Lab 05/05/14 0425  NA 138  K 4.2  CL 98  CO2 25  BUN 11  CREATININE 0.86  CALCIUM 9.2  PROT 6.9  BILITOT 0.9  ALKPHOS 61  ALT 54*  AST 38*  GLUCOSE 209*   Radiology/Studies:  Dg Chest Portable 1 View  05/04/2014   CLINICAL DATA:  Chest pain.  EXAM: PORTABLE CHEST - 1 VIEW  COMPARISON:  None.  FINDINGS: Lungs are clear. Heart size is upper normal. There is no evidence pulmonary edema. No pneumothorax or pleural effusion.  IMPRESSION: No acute disease.   Electronically Signed   By: Inge Rise M.D.   On: 05/04/2014 07:19   EKG: atrial fib 171bpm nonspecific ST-T changes F/u this AM: NSR 81bpm, TWI III and flattening avF, otherwise no acute changes  Physical  Exam: Blood pressure 120/70, pulse 78, temperature 98.5 F (36.9 C), temperature source Axillary, resp. rate 15, height 5\' 11"  (1.803 m), weight 197 lb 1.5 oz (89.4 kg), SpO2 97.00%. General: Well developed, well nourished WM in no acute distress. Head: Normocephalic, atraumatic, sclera non-icteric, no xanthomas, nares are without discharge.  Neck: Negative for carotid bruits. JVD not elevated. Lungs: Clear bilaterally to auscultation without wheezes, rales, or rhonchi. Breathing is unlabored. Heart: RRR with S1 S2. No murmurs, rubs, or gallops appreciated. Abdomen: Soft, non-tender, non-distended with normoactive bowel sounds. No hepatomegaly. No rebound/guarding. No obvious abdominal masses. Msk:  Strength and tone appear normal for age. Extremities: No clubbing or cyanosis. No edema.  Distal pedal pulses are 2+ and equal bilaterally. Neuro: Alert and oriented X 3. No facial asymmetry. No focal deficit. Moves all extremities spontaneously. Psych:  Responds to questions appropriately with a normal affect.   Assessment and Plan:   1. Newly recognized atrial fibrillation with RVR 2. Possible associated chest discomfort 3. HTN 4. Diabetes mellitus 5. H/o GERD without prior bleeding issues 6. Mild thrombocytopenia and transaminitis  Await 2D echocardiogram. Agree with transition to oral diltiazem. Will discuss if any ischemic evaluation is necessary given cardiac risk factors. If not, would not necessarily keep him on both aspirin and anticoagulation. Favor NAOC therapy as CHADSVASC score = 3.   Signed, Melina Copa PA-C 05/05/2014, 10:11 AM   Attending note:  Patient seen and examined. Reviewed records, agree with above assessment by Ms. Dunn PA-C. Mr. Hemp presents with an episode of vague chest discomfort and indigestion/belching, ultimately diagnosed with atrial fibrillation with RVR. He has spontaneously converted to sinus rhythm with rate control, has ruled out for ACS by cardiac  markers. ECG shows nonspecific T-wave changes in sinus rhythm, overall nonspecific ST changes when in rapid atrial fibrillation. TSH is normal. Renal function normal, hemoglobin 13.7, platelets 119. No known history of hepatic disease or other major bleeding problems.  Echocardiogram is pending for cardiac structural assessment. Agree with transition from intravenous diltiazem to oral regimen, would probably try and go with Cardizem CD 120 mg daily instead of a multi-divided dose schedule. This may necessitate discontinuing or at least decreasing his Lotrel so as not to induce hypotension. Patient's CHADSVASC score is 3, annual stroke risk of approximately 4% on no treatment. Aspirin daily would reduce this risk to approximately 3% per year, although with an NOAC such as Eliquis, this risk would be reduced to approximately 1% per year. Major risk of bleeding in that case approximately 2% per year. We discussed these issues today, and he agrees to trial of Eliquis. If his echocardiogram is overall reassuring and he is discharged home today, would start Eliquis 5 mg twice daily, stop aspirin. We will schedule an office visit for him to be reassessed.  Satira Sark, M.D., F.A.C.C.

## 2014-05-05 NOTE — Progress Notes (Signed)
  Echocardiogram 2D Echocardiogram has been performed.  [place], [place] 05/05/2014, 4:15 PM

## 2014-05-05 NOTE — Progress Notes (Signed)
Inpatient Diabetes Program Recommendations  AACE/ADA: New Consensus Statement on Inpatient Glycemic Control (2013)  Target Ranges:  Prepandial:   less than 140 mg/dL      Peak postprandial:   less than 180 mg/dL (1-2 hours)      Critically ill patients:  140 - 180 mg/dL   Results for SHANTA, DORVIL (MRN [id number]) as of 05/05/2014 07:57  Ref. Range 05/13/2013 07:53 05/04/2014 11:21 05/04/2014 16:26 05/04/2014 22:37 05/05/2014 07:20  Glucose-Capillary Latest Range: 70-99 mg/dL 145 (H) 226 (H) 169 (H) 203 (H) 202 (H)   Diabetes history: DM2 Outpatient Diabetes medications: Metformin 1000 mg BID Current orders for Inpatient glycemic control: Novolog 0-9 units AC  Inpatient Diabetes Program Recommendations Correction (SSI): Please consider increasing Novolog correction to moderate scale and adding bedtime correction scale. A1C: Please consider ordering an A1C to evaluate glycemic control over the past 2-3 months.  Thanks, Barnie Alderman, RN, MSN, CCRN Diabetes Coordinator Inpatient Diabetes Program [phone] (Team Pager) [phone] ([facility] office) [phone] [facility] office)

## 2014-05-05 NOTE — Progress Notes (Signed)
Pt a/o.vss. Saline lock removed. Up ad lib. Denies chest pain or any distress. Discharge instructions given. prescriptions given. Pt verbalized understanding of instructions. Pt left floor via wheelchair with family and nursing staff.

## 2014-05-05 NOTE — Care Management Note (Signed)
    Page 1 of 1   05/05/2014     3:22:36 PM CARE MANAGEMENT NOTE 05/05/2014  Patient:  Derek Drake, Derek Drake   Account Number:  [id number]  Date Initiated:  05/05/2014  Documentation initiated by:  Theophilus Kinds  Subjective/Objective Assessment:   Pt admitted from home with a fib. Pt lives with his wife and will return home at discharge. Pt is independent with ADL's.     Action/Plan:   No CM needs noted. Cm did check on copay for eliquis. Information relayed to pt and MD.   Anticipated DC Date:  05/05/2014   Anticipated DC Plan:  [place]  CM consult      Choice offered to / List presented to:             Status of service:  Completed, signed off Medicare Important Message given?  YES (If response is "NO", the following Medicare IM given date fields will be blank) Date Medicare IM given:  05/05/2014 Medicare IM given by:  Theophilus Kinds Date Additional Medicare IM given:   Additional Medicare IM given by:    Discharge Disposition:  HOME/SELF CARE  Per UR Regulation:    If discussed at Long Length of Stay Meetings, dates discussed:    Comments:  05/05/14 [place], RN BSN CM

## 2014-05-05 NOTE — Progress Notes (Signed)
  PROGRESS NOTE  Derek Drake OFB:[id number] DOB: 1946/04/18 DOA: 05/04/2014 PCP: [name]., MD  Summary: 68 year old male with history of diabetes, hypertension but no cardiac disease who presented with atrial fibrillation with rapid ventricular response, new diagnosis.  Assessment/Plan: 1. New diagnosis atrial fibrillation with rapid ventricular response. Now in sinus rhythm. Duration unclear, suspect paroxysmal atrial fibrillation. CHADSVASC 3. 2. Diabetes mellitus type 2. Stable. Resume metformin on discharge. 3. Hypertension. Stable. 4. Hyperlipidemia. 5. Mild transaminitis of unclear etiology, stable, per PCP 11/03/13 AST 34, ALT 54. Suspect statin. Consider followup as an outpatient. 6. Mild thrombocytopenia, stable. Significance unclear. Plt 156 on 11/03/13 per office.   Stable. Now in sinus rhythm. Await 2-D echocardiogram. Workup otherwise unrevealing. Plan transition to oral Cardizem. Discussed anticoagulation risk/benefit, ASA vs. Anticoagulants, warfarin vs. Newer agents. Daughter sees Dr. Sallyanne Kuster in [place], patient/wife would like cardiology opinion.   Likely home later today.  Code Status: full code DVT prophylaxis: Lovenox Family Communication: discussed in detail with wife at bedside Disposition Plan: home  Murray Hodgkins, MD  [facility]  Pager [phone] If 7PM-7AM, please contact night-coverage at [email or web address], password [facility] 05/05/2014, 8:46 AM  LOS: 1 day   Consultants:  Cardiology  Procedures:  2-D echocardiogram pending  Antibiotics:    HPI/Subjective: Has converted to sinus rhythm.  No issues overnight. No chest pain shortness of breath. Feels well. To go home.  Objective: Filed Vitals:   05/05/14 0500 05/05/14 0600 05/05/14 0700 05/05/14 0800  BP: 112/68 113/68 111/66 125/76  Pulse: 76 75 77 77  Temp:    98.5 F (36.9 C)  TempSrc:      Resp: 18 14 14 20   Height:      Weight:      SpO2: 94% 96% 95% 98%     Intake/Output Summary (Last 24 hours) at 05/05/14 0846 Last data filed at 05/05/14 0600  Gross per 24 hour  Intake   1235 ml  Output      0 ml  Net   1235 ml     Filed Weights   05/04/14 0622 05/04/14 0848  Weight: 87.544 kg (193 lb) 89.4 kg (197 lb 1.5 oz)    Exam:     Afebrile, Drake signs are stable. No hypoxia. Gen. Appears calm, comfortable.  Psych. Alert. Speech fluent and clear.  Cardiovascular. Regular rate and rhythm. No murmur, rub or gallop. No lower extremity edema. Telemetry sinus rhythm.  Respiratory. Clear to auscultation bilaterally. No wheezes, rales or rhonchi. Normal respiratory effort.  Data Reviewed:  Complete metabolic panel notable for stable mild elevation of AST, ALT. Troponins negative.TSH normal.  CBC notable for stable thrombocytopenia, platelet count 119  Scheduled Meds: . aspirin EC  81 mg Oral Daily  . atorvastatin  20 mg Oral q1800  . enoxaparin (LOVENOX) injection  1 mg/kg Subcutaneous Q12H  . insulin aspart  0-9 Units Subcutaneous TID WC  . pantoprazole  40 mg Oral Daily  . sodium chloride  3 mL Intravenous Q12H   Continuous Infusions: . diltiazem (CARDIZEM) infusion 5 mg/hr (05/05/14 0750)    Principal Problem:   Atrial fibrillation with rapid ventricular response Active Problems:   Thrombocytopenia   Transaminitis   DM type 2 (diabetes mellitus, type 2)   Hypertension

## 2014-05-25 ENCOUNTER — Ambulatory Visit (INDEPENDENT_AMBULATORY_CARE_PROVIDER_SITE_OTHER): Payer: Medicare Other | Admitting: Cardiology

## 2014-05-25 ENCOUNTER — Encounter: Payer: Self-pay | Admitting: Cardiology

## 2014-05-25 VITALS — BP 140/86 | HR 85 | Ht 71.0 in | Wt 199.0 lb

## 2014-05-25 DIAGNOSIS — I480 Paroxysmal atrial fibrillation: Secondary | ICD-10-CM

## 2014-05-25 DIAGNOSIS — I10 Essential (primary) hypertension: Secondary | ICD-10-CM

## 2014-05-25 DIAGNOSIS — I4891 Unspecified atrial fibrillation: Secondary | ICD-10-CM

## 2014-05-25 NOTE — Assessment & Plan Note (Signed)
Maintaining sinus rhythm, no significant palpitations. He reports tolerating his current medications, no changes were made today. Followup arranged in 3 months preceded by CBC and BMET.

## 2014-05-25 NOTE — Progress Notes (Signed)
Clinical Summary Mr. Bembenek is a 68 y.o.male seen recently as an inpatient consult at [facility] in late August with newly recognized atrial fibrillation associated with RVR. He spontaneously converted to sinus rhythm with rate control and ruled out for ACS by cardiac enzymes. Echocardiogram reported mild LVH with LVEF 28-00%, grade 1 diastolic dysfunction with increased filling pressures, upper normal left atrial chamber size, mild mitral regurgitation, PASP 31 mm mercury. He was placed on Eliquis with CHADSVASC score of 3, also Cardizem CD.  He presents today stating that he feels very well, has been walking around more easily with less shortness of breath, no sense of prolonged palpitations or chest pain. He denies any significant bleeding problems. States that he saw Dr. Gerarda Fraction in the interim.  Recent lab work showed potassium 4.2, BUN 11, creatinine 0.6, hemoglobin 13.7, platelets 119, TSH 2.2.   Allergies  Allergen Reactions  . Rabbit Protein Swelling    Face swelled up  . Other     Dial soap    Current Outpatient Prescriptions  Medication Sig Dispense Refill  . apixaban (ELIQUIS) 5 MG TABS tablet Take 1 tablet (5 mg total) by mouth 2 (two) times daily.  60 tablet  0  . CINNAMON PO Take 1 capsule by mouth daily.       Marland Kitchen diltiazem (CARDIZEM CD) 120 MG 24 hr capsule Take 1 capsule (120 mg total) by mouth daily. Start 8/22 in the evening  30 capsule  0  . Krill Oil 1000 MG CAPS Take 1 capsule by mouth daily.      . metFORMIN (GLUCOPHAGE) 500 MG tablet Take 1,000 mg by mouth 2 (two) times daily with a meal.      . Multiple Vitamin (MULTIVITAMIN) capsule Take 1 capsule by mouth daily.      Marland Kitchen omeprazole (PRILOSEC) 20 MG capsule Take 20 mg by mouth daily.      . simvastatin (ZOCOR) 40 MG tablet Take 40 mg by mouth every evening.       No current facility-administered medications for this visit.    Past Medical History  Diagnosis Date  . GERD (gastroesophageal reflux disease)   .  Essential hypertension, benign   . Hypercholesteremia   . Type 2 diabetes mellitus   . PAF (paroxysmal atrial fibrillation) 05/04/2014    Social History Mr. Sippel reports that he quit smoking about 5 years ago. His smoking use included Cigarettes. He started smoking about 58 years ago. He has a 20 pack-year smoking history. He does not have any smokeless tobacco history on file. Mr. Oettinger reports that he drinks alcohol.  Review of Systems Other systems reviewed and negative except as outlined.  Physical Examination Filed Vitals:   05/25/14 1257  BP: 140/86  Pulse: 85   Filed Weights   05/25/14 1257  Weight: 199 lb (90.266 kg)   Patient appears comfortable at rest. HEENT: Conjunctiva and lids normal, oropharynx clear. Neck: Supple, no elevated JVP or carotid bruits, no thyromegaly. Lungs: Clear to auscultation, nonlabored breathing at rest. Cardiac: Regular rate and rhythm, no S3 or significant systolic murmur, no pericardial rub. Abdomen: Soft, nontender, bowel sounds present. Extremities: No pitting edema, distal pulses 2+. Skin: Warm and dry. Musculoskeletal: No kyphosis. Neuropsychiatric: Alert and oriented x3, affect grossly appropriate.   Problem List and Plan   PAF (paroxysmal atrial fibrillation) Maintaining sinus rhythm, no significant palpitations. He reports tolerating his current medications, no changes were made today. Followup arranged in 3 months preceded by CBC  and BMET.  Essential hypertension, benign Keep followup with Dr. Gerarda Fraction.    Satira Sark, M.D., F.A.C.C.

## 2014-05-25 NOTE — Assessment & Plan Note (Signed)
Keep followup with Dr. Gerarda Fraction.

## 2014-05-25 NOTE — Patient Instructions (Signed)
Your physician recommends that you schedule a follow-up appointment in: 3 months    Your physician recommends that you continue on your current medications as directed. Please refer to the Current Medication list given to you today.     PLEASE get blood work Cytogeneticist before follow visit with Dr.McDowell   (CBC,BMET)

## 2014-08-22 LAB — CBC
HCT: 41.1 % (ref 39.0–52.0)
HEMOGLOBIN: 14.5 g/dL (ref 13.0–17.0)
MCH: 29.3 pg (ref 26.0–34.0)
MCHC: 35.3 g/dL (ref 30.0–36.0)
MCV: 83 fL (ref 78.0–100.0)
MPV: 10.8 fL (ref 9.4–12.4)
PLATELETS: 124 10*3/uL — AB (ref 150–400)
RBC: 4.95 MIL/uL (ref 4.22–5.81)
RDW: 13.8 % (ref 11.5–15.5)
WBC: 7.3 10*3/uL (ref 4.0–10.5)

## 2014-08-22 LAB — BASIC METABOLIC PANEL
BUN: 12 mg/dL (ref 6–23)
CHLORIDE: 97 meq/L (ref 96–112)
CO2: 29 meq/L (ref 19–32)
CREATININE: 0.93 mg/dL (ref 0.50–1.35)
Calcium: 9.9 mg/dL (ref 8.4–10.5)
Glucose, Bld: 182 mg/dL — ABNORMAL HIGH (ref 70–99)
Potassium: 4.4 mEq/L (ref 3.5–5.3)
Sodium: 136 mEq/L (ref 135–145)

## 2014-08-28 ENCOUNTER — Ambulatory Visit (INDEPENDENT_AMBULATORY_CARE_PROVIDER_SITE_OTHER): Payer: Medicare Other | Admitting: Cardiology

## 2014-08-28 ENCOUNTER — Encounter: Payer: Self-pay | Admitting: Cardiology

## 2014-08-28 VITALS — BP 148/90 | HR 80 | Ht 71.0 in | Wt 197.0 lb

## 2014-08-28 DIAGNOSIS — I10 Essential (primary) hypertension: Secondary | ICD-10-CM

## 2014-08-28 NOTE — Patient Instructions (Signed)
Your physician wants you to follow-up in: 6 months You will receive a reminder letter in the mail two months in advance. If you don't receive a letter, please call our office to schedule the follow-up appointment.    Your physician recommends that you continue on your current medications as directed. Please refer to the Current Medication list given to you today.    Please get lab work JUST BEFORE f/u IN 6 MONTHS    Thank you for choosing [facility] !

## 2014-08-28 NOTE — Assessment & Plan Note (Signed)
Keep follow-up with Dr. Gerarda Fraction.

## 2014-08-28 NOTE — Assessment & Plan Note (Signed)
Symptomatically well controlled. Continue Cardizem CD and Eliquis. Six-month follow-up with CBC and BMET.

## 2014-08-28 NOTE — Progress Notes (Signed)
   Reason for visit: PAF  Clinical Summary Derek Drake is a 68 y.o.male last seen in September. He is here for a routine visit. Reports no prolonged palpitations or gas-like discomfort which is what he experienced with atrial fibrillation previously. He has had no bleeding issues with Eliquis, and continues on Cardizem CD without difficulty.  Recent lab work showed hemoglobin 14.5, platelets 124 (stable), potassium 4.4, BUN 12, creatinine 0.9  Echocardiogram from August of this year reported mild LVH with LVEF 73-41%, grade 1 diastolic dysfunction with increased filling pressures, upper normal left atrial chamber size, mild mitral regurgitation, PASP 31 mm mercury.  Allergies  Allergen Reactions  . Rabbit Protein Swelling    Face swelled up  . Other     Dial soap    Current Outpatient Prescriptions  Medication Sig Dispense Refill  . apixaban (ELIQUIS) 5 MG TABS tablet Take 1 tablet (5 mg total) by mouth 2 (two) times daily. 60 tablet 0  . CINNAMON PO Take 1 capsule by mouth daily.     Marland Kitchen diltiazem (CARDIZEM CD) 120 MG 24 hr capsule Take 1 capsule (120 mg total) by mouth daily. Start 8/22 in the evening 30 capsule 0  . Krill Oil 1000 MG CAPS Take 1 capsule by mouth daily.    . metFORMIN (GLUCOPHAGE) 500 MG tablet Take 1,000 mg by mouth 2 (two) times daily with a meal.    . Multiple Vitamin (MULTIVITAMIN) capsule Take 1 capsule by mouth daily.    Marland Kitchen omeprazole (PRILOSEC) 20 MG capsule Take 20 mg by mouth daily.    . simvastatin (ZOCOR) 40 MG tablet Take 40 mg by mouth every evening.     No current facility-administered medications for this visit.    Past Medical History  Diagnosis Date  . GERD (gastroesophageal reflux disease)   . Essential hypertension, benign   . Hypercholesteremia   . Type 2 diabetes mellitus   . PAF (paroxysmal atrial fibrillation) 05/04/2014    Social History Mr. Balentine reports that he quit smoking about 5 years ago. His smoking use included Cigarettes. He  started smoking about 58 years ago. He has a 20 pack-year smoking history. His smokeless tobacco use includes Chew. Mr. Tinnon reports that he drinks alcohol.  Review of Systems Complete review of systems negative except as otherwise outlined in the clinical summary and also the following. Stable NYHA class II dyspnea, no chest pain. No falls.  Physical Examination Filed Vitals:   08/28/14 1041  BP: 148/90  Pulse: 80   Filed Weights   08/28/14 1041  Weight: 197 lb (89.359 kg)    Patient appears comfortable at rest. HEENT: Conjunctiva and lids normal, oropharynx clear. Neck: Supple, no elevated JVP or carotid bruits, no thyromegaly. Lungs: Clear to auscultation, nonlabored breathing at rest. Cardiac: Regular rate and rhythm, no S3 or significant systolic murmur, no pericardial rub. Abdomen: Soft, nontender, bowel sounds present. Extremities: No pitting edema, distal pulses 2+.   Problem List and Plan   PAF (paroxysmal atrial fibrillation) Symptomatically well controlled. Continue Cardizem CD and Eliquis. Six-month follow-up with CBC and BMET.  Essential hypertension, benign Keep follow-up with Dr. Gerarda Fraction.    Satira Sark, M.D., F.A.C.C.

## 2014-10-26 DIAGNOSIS — Z6828 Body mass index (BMI) 28.0-28.9, adult: Secondary | ICD-10-CM | POA: Diagnosis not present

## 2014-10-26 DIAGNOSIS — Z0000 Encounter for general adult medical examination without abnormal findings: Secondary | ICD-10-CM | POA: Diagnosis not present

## 2014-10-26 DIAGNOSIS — E1140 Type 2 diabetes mellitus with diabetic neuropathy, unspecified: Secondary | ICD-10-CM | POA: Diagnosis not present

## 2014-10-26 DIAGNOSIS — I10 Essential (primary) hypertension: Secondary | ICD-10-CM | POA: Diagnosis not present

## 2014-11-06 DIAGNOSIS — H40033 Anatomical narrow angle, bilateral: Secondary | ICD-10-CM | POA: Diagnosis not present

## 2014-11-06 DIAGNOSIS — E119 Type 2 diabetes mellitus without complications: Secondary | ICD-10-CM | POA: Diagnosis not present

## 2014-11-23 DIAGNOSIS — E119 Type 2 diabetes mellitus without complications: Secondary | ICD-10-CM | POA: Diagnosis not present

## 2015-02-09 DIAGNOSIS — E1165 Type 2 diabetes mellitus with hyperglycemia: Secondary | ICD-10-CM | POA: Diagnosis not present

## 2015-02-09 DIAGNOSIS — E119 Type 2 diabetes mellitus without complications: Secondary | ICD-10-CM | POA: Diagnosis not present

## 2015-02-09 DIAGNOSIS — I10 Essential (primary) hypertension: Secondary | ICD-10-CM | POA: Diagnosis not present

## 2015-02-09 LAB — CBC
HCT: 41.4 % (ref 39.0–52.0)
Hemoglobin: 14.1 g/dL (ref 13.0–17.0)
MCH: 28.6 pg (ref 26.0–34.0)
MCHC: 34.1 g/dL (ref 30.0–36.0)
MCV: 84 fL (ref 78.0–100.0)
MPV: 11.1 fL (ref 9.4–12.4)
Platelets: 121 10*3/uL — ABNORMAL LOW (ref 150–400)
RBC: 4.93 MIL/uL (ref 4.22–5.81)
RDW: 14.3 % (ref 11.5–15.5)
WBC: 6.7 10*3/uL (ref 4.0–10.5)

## 2015-02-10 LAB — BASIC METABOLIC PANEL
BUN: 14 mg/dL (ref 6–23)
CALCIUM: 9.4 mg/dL (ref 8.4–10.5)
CO2: 28 meq/L (ref 19–32)
CREATININE: 0.81 mg/dL (ref 0.50–1.35)
Chloride: 98 mEq/L (ref 96–112)
GLUCOSE: 150 mg/dL — AB (ref 70–99)
POTASSIUM: 4.4 meq/L (ref 3.5–5.3)
Sodium: 137 mEq/L (ref 135–145)

## 2015-02-19 ENCOUNTER — Encounter: Payer: Self-pay | Admitting: Cardiology

## 2015-02-19 ENCOUNTER — Ambulatory Visit (INDEPENDENT_AMBULATORY_CARE_PROVIDER_SITE_OTHER): Payer: Medicare Other | Admitting: Cardiology

## 2015-02-19 VITALS — BP 140/82 | HR 71 | Ht 71.0 in | Wt 197.0 lb

## 2015-02-19 DIAGNOSIS — I10 Essential (primary) hypertension: Secondary | ICD-10-CM

## 2015-02-19 DIAGNOSIS — I480 Paroxysmal atrial fibrillation: Secondary | ICD-10-CM | POA: Diagnosis not present

## 2015-02-19 NOTE — Patient Instructions (Signed)
Your physician wants you to follow-up in: 6 months with Dr.McDowell You will receive a reminder letter in the mail two months in advance. If you don't receive a letter, please call our office to schedule the follow-up appointment.     Your physician recommends that you continue on your current medications as directed. Please refer to the Current Medication list given to you today.     Thank you for choosing [facility] !        

## 2015-02-19 NOTE — Progress Notes (Signed)
Cardiology Office Note  Date: 02/19/2015   ID: Derek Drake, Derek Drake [birth year], MRN [id number]  PCP: [name]., MD  Primary Cardiologist: [name], MD   Chief Complaint  Patient presents with  . PAF    History of Present Illness: Derek Drake is a 69 y.o. male last seen in December 2015. He presents for a routine follow-up visit. He has only occasional palpitations, nothing progressive, no associated chest pain. Continues to remain as active as he can with chores and outdoor work, also enjoys playing the guitar the banjo.  Recent lab work from May is outlined below. He continues on Eliquis, no reported bleeding episodes.   Past Medical History  Diagnosis Date  . GERD (gastroesophageal reflux disease)   . Essential hypertension, benign   . Hypercholesteremia   . Type 2 diabetes mellitus   . PAF (paroxysmal atrial fibrillation) 05/04/2014    Current Outpatient Prescriptions  Medication Sig Dispense Refill  . apixaban (ELIQUIS) 5 MG TABS tablet Take 1 tablet (5 mg total) by mouth 2 (two) times daily. 60 tablet 0  . CINNAMON PO Take 1 capsule by mouth daily.     [name] 1000 MG CAPS Take 1 capsule by mouth daily.    . metFORMIN (GLUCOPHAGE) 500 MG tablet Take 1,000 mg by mouth 2 (two) times daily with a meal.    . Multiple Vitamin (MULTIVITAMIN) capsule Take 1 capsule by mouth daily.    Marland Kitchen omeprazole (PRILOSEC) 20 MG capsule Take 20 mg by mouth daily.    . simvastatin (ZOCOR) 40 MG tablet Take 40 mg by mouth every evening.    . diltiazem (CARDIZEM CD) 180 MG 24 hr capsule Take 180 mg by mouth daily.     Marland Kitchen glipiZIDE (GLUCOTROL) 5 MG tablet Take 5 mg by mouth daily before breakfast.      No current facility-administered medications for this visit.    Allergies:  Rabbit protein and Other   Social History: The patient  reports that he quit smoking about 5 years ago. His smoking use included Cigarettes. He started smoking about 58 years ago. He has a 20 pack-year  smoking history. His smokeless tobacco use includes Chew. He reports that he drinks alcohol. He reports that he does not use illicit drugs.   ROS:  Please see the history of present illness. Otherwise, complete review of systems is positive for none.  All other systems are reviewed and negative.   Physical Exam: VS:  BP 140/82 mmHg  Pulse 71  Ht 5\' 11"  (1.803 m)  Wt 197 lb (89.359 kg)  BMI 27.49 kg/m2  SpO2 96%, BMI Body mass index is 27.49 kg/(m^2).  Wt Readings from Last 3 Encounters:  02/19/15 197 lb (89.359 kg)  08/28/14 197 lb (89.359 kg)  05/25/14 199 lb (90.266 kg)     Patient appears comfortable at rest. HEENT: Conjunctiva and lids normal, oropharynx clear. Neck: Supple, no elevated JVP or carotid bruits, no thyromegaly. Lungs: Clear to auscultation, nonlabored breathing at rest. Cardiac: Regular rate and rhythm, no S3 or significant systolic murmur, no pericardial rub. Abdomen: Soft, nontender, bowel sounds present. Extremities: No pitting edema, distal pulses 2+.   ECG: ECG is not ordered today.  Recent Labwork: 05/04/2014: TSH 2.260 05/05/2014: ALT 54*; AST 38* 02/09/2015: BUN 14; Creatinine 0.81; Hemoglobin 14.1; Platelets 121*; Potassium 4.4; Sodium 137   Other Studies Reviewed Today:  Echocardiogram from August 2015 reported mild LVH with LVEF 09-73%, grade 1 diastolic dysfunction with  increased filling pressures, upper normal left atrial chamber size, mild mitral regurgitation, PASP 31 mm mercury.  ASSESSMENT AND PLAN:  1. Paroxysmal atrial fibrillation, symptomatically stable on current regimen including Cardizem CD and Eliquis. No changes were made today.  2. Essential hypertension, continue current medications and follow-up with Dr. Gerarda Fraction.  Current medicines were reviewed at length with the patient today.   Disposition: FU with me in 6 months.   Signed, Satira Sark, MD, [facility] 02/19/2015 9:09 AM    [facility] [place]. [address], [place], [place] [address] Phone: [phone]; Fax: [phone]

## 2015-02-23 DIAGNOSIS — E119 Type 2 diabetes mellitus without complications: Secondary | ICD-10-CM | POA: Diagnosis not present

## 2015-05-28 DIAGNOSIS — Z23 Encounter for immunization: Secondary | ICD-10-CM | POA: Diagnosis not present

## 2015-06-06 DIAGNOSIS — E119 Type 2 diabetes mellitus without complications: Secondary | ICD-10-CM | POA: Diagnosis not present

## 2015-08-02 DIAGNOSIS — E1142 Type 2 diabetes mellitus with diabetic polyneuropathy: Secondary | ICD-10-CM | POA: Diagnosis not present

## 2015-08-02 DIAGNOSIS — Z6827 Body mass index (BMI) 27.0-27.9, adult: Secondary | ICD-10-CM | POA: Diagnosis not present

## 2015-08-02 DIAGNOSIS — K219 Gastro-esophageal reflux disease without esophagitis: Secondary | ICD-10-CM | POA: Diagnosis not present

## 2015-08-02 DIAGNOSIS — Z1389 Encounter for screening for other disorder: Secondary | ICD-10-CM | POA: Diagnosis not present

## 2015-08-02 DIAGNOSIS — I480 Paroxysmal atrial fibrillation: Secondary | ICD-10-CM | POA: Diagnosis not present

## 2015-08-06 ENCOUNTER — Telehealth: Payer: Self-pay

## 2015-08-06 ENCOUNTER — Telehealth: Payer: Self-pay | Admitting: Cardiology

## 2015-08-06 NOTE — Telephone Encounter (Signed)
Attempt to request Eliquis PA from [facility] in [place] ,line busy x 3.pt has refilled meds without problems ,unclear why he needs PA now

## 2015-08-06 NOTE — Telephone Encounter (Signed)
Derek Drake, patient's wife, said the pharmacy told them they need a Prior Auth for his Eliquis refill. The patient is wondering if he can stop taking Eliquis and go back to taking aspirin.

## 2015-08-06 NOTE — Telephone Encounter (Signed)
PA approved for eliqis thru 07/216, wife made aware

## 2015-08-24 ENCOUNTER — Ambulatory Visit (INDEPENDENT_AMBULATORY_CARE_PROVIDER_SITE_OTHER): Payer: Medicare Other | Admitting: Cardiology

## 2015-08-24 ENCOUNTER — Encounter: Payer: Self-pay | Admitting: Cardiology

## 2015-08-24 VITALS — BP 138/82 | HR 77 | Ht 71.0 in | Wt 202.0 lb

## 2015-08-24 DIAGNOSIS — I480 Paroxysmal atrial fibrillation: Secondary | ICD-10-CM

## 2015-08-24 DIAGNOSIS — I4891 Unspecified atrial fibrillation: Secondary | ICD-10-CM | POA: Diagnosis not present

## 2015-08-24 DIAGNOSIS — I10 Essential (primary) hypertension: Secondary | ICD-10-CM | POA: Diagnosis not present

## 2015-08-24 LAB — CBC
HCT: 39.2 % (ref 39.0–52.0)
HEMOGLOBIN: 13.3 g/dL (ref 13.0–17.0)
MCH: 28.4 pg (ref 26.0–34.0)
MCHC: 33.9 g/dL (ref 30.0–36.0)
MCV: 83.8 fL (ref 78.0–100.0)
MPV: 11.2 fL (ref 8.6–12.4)
Platelets: 121 10*3/uL — ABNORMAL LOW (ref 150–400)
RBC: 4.68 MIL/uL (ref 4.22–5.81)
RDW: 13.9 % (ref 11.5–15.5)
WBC: 6.3 10*3/uL (ref 4.0–10.5)

## 2015-08-24 LAB — BASIC METABOLIC PANEL
BUN: 13 mg/dL (ref 7–25)
CALCIUM: 9.6 mg/dL (ref 8.6–10.3)
CO2: 25 mmol/L (ref 20–31)
CREATININE: 0.88 mg/dL (ref 0.70–1.25)
Chloride: 99 mmol/L (ref 98–110)
GLUCOSE: 187 mg/dL — AB (ref 65–99)
Potassium: 4.3 mmol/L (ref 3.5–5.3)
Sodium: 138 mmol/L (ref 135–146)

## 2015-08-24 NOTE — Patient Instructions (Signed)
Your physician wants you to follow-up in: 6 months with Dr Ferne Reus will receive a reminder letter in the mail two months in advance. If you don't receive a letter, please call our office to schedule the follow-up appointment.   Your physician recommends that you continue on your current medications as directed. Please refer to the Current Medication list given to you today.   If you need a refill on your cardiac medications before your next appointment, please call your pharmacy.    Lab work TODAY     Thank you for choosing [facility] !

## 2015-08-24 NOTE — Progress Notes (Signed)
Cardiology Office Note  Date: 08/24/2015   ID: Teo, Weisel 1946/03/03, MRN [id number]  PCP: [name]., MD  Primary Cardiologist: [name], MD   Chief Complaint  Patient presents with  . Atrial Fibrillation   History of Present Illness: Derek Drake is a 69 y.o. male last seen in June. He presents for a routine follow-up visit. He has had no significant palpitations or chest pain. Reports tolerating his typical activities and outdoor chores without major functional limitation.  We reviewed his medications which are outlined below. He continues on Cardizem CD (dose has been increased since I saw him), and Eliquis. He reports no bleeding problems. He is due for follow-up CBC and BMET on Eliquis.  ECG today shows normal sinus rhythm.  Past Medical History  Diagnosis Date  . GERD (gastroesophageal reflux disease)   . Essential hypertension, benign   . Hypercholesteremia   . Type 2 diabetes mellitus ([place])   . PAF (paroxysmal atrial fibrillation) ([place]) 05/04/2014    Current Outpatient Prescriptions  Medication Sig Dispense Refill  . apixaban (ELIQUIS) 5 MG TABS tablet Take 1 tablet (5 mg total) by mouth 2 (two) times daily. 60 tablet 0  . CARTIA XT 240 MG 24 hr capsule     . CINNAMON PO Take 1 capsule by mouth daily.     Marland Kitchen glipiZIDE (GLUCOTROL) 5 MG tablet Take 5 mg by mouth daily before breakfast.     . Krill Oil 1000 MG CAPS Take 1 capsule by mouth daily.    . metFORMIN (GLUCOPHAGE) 500 MG tablet Take 1,000 mg by mouth 2 (two) times daily with a meal.    . Multiple Vitamin (MULTIVITAMIN) capsule Take 1 capsule by mouth daily.    Marland Kitchen omeprazole (PRILOSEC) 20 MG capsule Take 20 mg by mouth daily.    . simvastatin (ZOCOR) 40 MG tablet Take 40 mg by mouth every evening.     No current facility-administered medications for this visit.   Allergies:  Rabbit protein and Other   Social History: The patient  reports that he quit smoking about 6 years ago. His  smoking use included Cigarettes. He started smoking about 59 years ago. He has a 20 pack-year smoking history. His smokeless tobacco use includes Chew. He reports that he drinks alcohol. He reports that he does not use illicit drugs.   ROS:  Please see the history of present illness. Otherwise, complete review of systems is positive for none.  All other systems are reviewed and negative.   Physical Exam: VS:  BP 138/82 mmHg  Pulse 77  Ht 5\' 11"  (1.803 m)  Wt 202 lb (91.627 kg)  BMI 28.19 kg/m2  SpO2 95%, BMI Body mass index is 28.19 kg/(m^2).  Wt Readings from Last 3 Encounters:  08/24/15 202 lb (91.627 kg)  02/19/15 197 lb (89.359 kg)  08/28/14 197 lb (89.359 kg)    Patient appears comfortable at rest. HEENT: Conjunctiva and lids normal, oropharynx clear. Neck: Supple, no elevated JVP or carotid bruits, no thyromegaly. Lungs: Clear to auscultation, nonlabored breathing at rest. Cardiac: Regular rate and rhythm, no S3 or significant systolic murmur, no pericardial rub. Abdomen: Soft, nontender, bowel sounds present. Extremities: No pitting edema, distal pulses 2+.  ECG: ECG is ordered today.  Recent Labwork: 02/09/2015: BUN 14; Creat 0.81; Hemoglobin 14.1; Platelets 121*; Potassium 4.4; Sodium 137   Other Studies Reviewed Today:  Echocardiogram from August 2015 reported mild LVH with LVEF 123456, grade 1 diastolic dysfunction with  increased filling pressures, upper normal left atrial chamber size, mild mitral regurgitation, PASP 31 mm mercury.  Assessment and Plan:  1. Paroxysmal atrial fibrillation, currently maintaining sinus rhythm. CHADSVASC score is 3. He continues on Eliquis, will follow-up with CBC and BMET. No change in Cardizem CD dose.  2. Essential hypertension, blood pressure control is adequate today.  Current medicines were reviewed with the patient today.   Orders Placed This Encounter  Procedures  . CBC  . Basic Metabolic Panel (BMET)  . EKG 12-Lead     Disposition: FU with me in 6 months.   Signed, Satira Sark, MD, [facility] 08/24/2015 8:45 AM    [facility] at [facility] [address], [place], [place] [address] Phone: [phone]; Fax: [phone]

## 2015-09-04 DIAGNOSIS — E119 Type 2 diabetes mellitus without complications: Secondary | ICD-10-CM | POA: Diagnosis not present

## 2015-11-05 DIAGNOSIS — H40033 Anatomical narrow angle, bilateral: Secondary | ICD-10-CM | POA: Diagnosis not present

## 2015-11-05 DIAGNOSIS — E119 Type 2 diabetes mellitus without complications: Secondary | ICD-10-CM | POA: Diagnosis not present

## 2015-11-06 DIAGNOSIS — Z1389 Encounter for screening for other disorder: Secondary | ICD-10-CM | POA: Diagnosis not present

## 2015-11-06 DIAGNOSIS — Z0000 Encounter for general adult medical examination without abnormal findings: Secondary | ICD-10-CM | POA: Diagnosis not present

## 2015-11-06 DIAGNOSIS — E1165 Type 2 diabetes mellitus with hyperglycemia: Secondary | ICD-10-CM | POA: Diagnosis not present

## 2015-11-06 DIAGNOSIS — R201 Hypoesthesia of skin: Secondary | ICD-10-CM | POA: Diagnosis not present

## 2015-11-06 DIAGNOSIS — E1142 Type 2 diabetes mellitus with diabetic polyneuropathy: Secondary | ICD-10-CM | POA: Diagnosis not present

## 2015-11-06 DIAGNOSIS — I10 Essential (primary) hypertension: Secondary | ICD-10-CM | POA: Diagnosis not present

## 2015-11-06 DIAGNOSIS — Z79899 Other long term (current) drug therapy: Secondary | ICD-10-CM | POA: Diagnosis not present

## 2015-11-09 ENCOUNTER — Emergency Department (HOSPITAL_COMMUNITY)
Admission: EM | Admit: 2015-11-09 | Discharge: 2015-11-10 | Discharge status: Against Medical Advice | Payer: Medicare Other | Attending: Emergency Medicine | Admitting: Emergency Medicine

## 2015-11-09 ENCOUNTER — Encounter (HOSPITAL_COMMUNITY): Payer: Self-pay | Admitting: Emergency Medicine

## 2015-11-09 DIAGNOSIS — R509 Fever, unspecified: Secondary | ICD-10-CM

## 2015-11-09 DIAGNOSIS — E119 Type 2 diabetes mellitus without complications: Secondary | ICD-10-CM | POA: Insufficient documentation

## 2015-11-09 DIAGNOSIS — Z87891 Personal history of nicotine dependence: Secondary | ICD-10-CM | POA: Insufficient documentation

## 2015-11-09 DIAGNOSIS — I10 Essential (primary) hypertension: Secondary | ICD-10-CM | POA: Insufficient documentation

## 2015-11-09 DIAGNOSIS — Z7984 Long term (current) use of oral hypoglycemic drugs: Secondary | ICD-10-CM | POA: Diagnosis not present

## 2015-11-09 DIAGNOSIS — R000 Tachycardia, unspecified: Secondary | ICD-10-CM | POA: Diagnosis not present

## 2015-11-09 DIAGNOSIS — R110 Nausea: Secondary | ICD-10-CM | POA: Diagnosis not present

## 2015-11-09 DIAGNOSIS — K219 Gastro-esophageal reflux disease without esophagitis: Secondary | ICD-10-CM | POA: Diagnosis not present

## 2015-11-09 DIAGNOSIS — Z79899 Other long term (current) drug therapy: Secondary | ICD-10-CM | POA: Insufficient documentation

## 2015-11-09 DIAGNOSIS — Z7901 Long term (current) use of anticoagulants: Secondary | ICD-10-CM | POA: Insufficient documentation

## 2015-11-09 DIAGNOSIS — E7800 Pure hypercholesterolemia, unspecified: Secondary | ICD-10-CM | POA: Diagnosis not present

## 2015-11-09 DIAGNOSIS — D696 Thrombocytopenia, unspecified: Secondary | ICD-10-CM | POA: Diagnosis not present

## 2015-11-09 DIAGNOSIS — I480 Paroxysmal atrial fibrillation: Secondary | ICD-10-CM | POA: Insufficient documentation

## 2015-11-09 DIAGNOSIS — R6883 Chills (without fever): Secondary | ICD-10-CM | POA: Diagnosis not present

## 2015-11-09 NOTE — ED Provider Notes (Signed)
CSN: [id number]     Arrival date & time 11/09/15  1839 History   First MD Initiated Contact with Patient 11/09/15 2236     Chief Complaint  Patient presents with  . Nausea  . Fever     (Consider location/radiation/quality/duration/timing/severity/associated sxs/prior Treatment) The history is provided by the patient and the spouse.   Derek Drake is a 70 y.o. male with a medical history outlined below presenting with a one day history of nausea without emesis and feeling of needing to have a bowel movement along with increased burping since eating fried chicken at a local restaurant last night.  Wife states he slept most of the day today and when he woke he was chilled with temperature of 102 checked just prior to arrival.  He has had no medications for fever or nausea and wife at bedside believes thermometer is broken since it was only 99.8 upon arrival.  He reports his symptoms are much improved since arriving, nausea is gone and he is now hungry and thirsty and also feels like he needs to have a bowel movement but wants to go hoe for this.  He denies chest pain, sob, palpitations, headache, cough, weakness or dizziness.      Past Medical History  Diagnosis Date  . GERD (gastroesophageal reflux disease)   . Essential hypertension, benign   . Hypercholesteremia   . Type 2 diabetes mellitus ([place])   . PAF (paroxysmal atrial fibrillation) ([place]) 05/04/2014   Past Surgical History  Procedure Laterality Date  . Cholecystectomy    . Colonoscopy N/A 05/13/2013    Procedure: COLONOSCOPY;  Surgeon: Daneil Dolin, MD;  Location: [facility];  Service: Endoscopy;  Laterality: N/A;  8:30 AM   Family History  Problem Relation Age of Onset  . Prostate cancer Brother   . Colon cancer Neg Hx    Social History  Substance Use Topics  . Smoking status: Former Smoker -- 2.00 packs/day for 10 years    Types: Cigarettes    Start date: 05/25/1956    Quit date: 05/25/2009  . Smokeless tobacco:  Current User    Types: Chew  . Alcohol Use: 0.0 oz/week    0 Standard drinks or equivalent per week     Comment: Rare - 1 beer every 3 months    Review of Systems  Constitutional: Positive for fever and chills.  HENT: Negative for congestion and sore throat.   Eyes: Negative.   Respiratory: Negative for chest tightness and shortness of breath.   Cardiovascular: Negative for chest pain and palpitations.  Gastrointestinal: Positive for nausea. Negative for vomiting, abdominal pain, diarrhea and constipation.  Genitourinary: Negative.   Musculoskeletal: Negative for joint swelling, arthralgias and neck pain.  Skin: Negative.  Negative for rash and wound.  Neurological: Negative for dizziness, weakness, light-headedness, numbness and headaches.  Psychiatric/Behavioral: Negative.       Allergies  Rabbit protein and Other  Home Medications   Prior to Admission medications   Medication Sig Start Date End Date Taking? Authorizing Provider  apixaban (ELIQUIS) 5 MG TABS tablet Take 1 tablet (5 mg total) by mouth 2 (two) times daily. 05/05/14  Yes [name], MD  CARTIA XT 240 MG 24 hr capsule Take 240 mg by mouth daily.  08/02/15  Yes Historical Provider, MD  CINNAMON PO Take 1 capsule by mouth daily.    Yes Historical Provider, MD  glipiZIDE (GLUCOTROL) 5 MG tablet Take 5 mg by mouth daily before breakfast.  02/09/15  Yes Historical Provider, MD  [name] 1000 MG CAPS Take 1 capsule by mouth daily.   Yes Historical Provider, MD  losartan (COZAAR) 25 MG tablet Take 25 mg by mouth daily. 11/06/15  Yes Historical Provider, MD  metFORMIN (GLUCOPHAGE) 500 MG tablet Take 1,000 mg by mouth 2 (two) times daily with a meal.   Yes Historical Provider, MD  Multiple Vitamin (MULTIVITAMIN) capsule Take 1 capsule by mouth daily.   Yes Historical Provider, MD  omeprazole (PRILOSEC) 20 MG capsule Take 20 mg by mouth daily.   Yes Historical Provider, MD  simvastatin (ZOCOR) 40 MG tablet Take 40 mg  by mouth every morning.    Yes Historical Provider, MD   BP 159/78 mmHg  Pulse 113  Temp(Src) 99.8 F (37.7 C) (Oral)  Resp 18  Ht 5\' 11"  (1.803 m)  Wt 90.719 kg  BMI 27.91 kg/m2  SpO2 95% Physical Exam  Constitutional: He appears well-developed and well-nourished.  HENT:  Head: Normocephalic and atraumatic.  Eyes: Conjunctivae are normal.  Neck: Normal range of motion.  Cardiovascular: Normal rate, regular rhythm, normal heart sounds and intact distal pulses.   Pulmonary/Chest: Effort normal and breath sounds normal. No respiratory distress. He has no wheezes. He has no rales.  Abdominal: Soft. He exhibits no distension. Bowel sounds are increased. There is no tenderness. There is no guarding.  Increased bowel sounds throughout all quadrants.  No increased tympany. Abdomen is soft.  Musculoskeletal: Normal range of motion.  Neurological: He is alert.  Skin: Skin is warm and dry.  Psychiatric: He has a normal mood and affect.  Nursing note and vitals reviewed.   ED Course  Procedures (including critical care time) Labs Review Labs Reviewed  CBG MONITORING, ED    Imaging Review No results found. I have personally reviewed and evaluated these images and lab results as part of my medical decision-making.   EKG Interpretation   Date/Time:  Friday November 09 2015 19:25:47 EST Ventricular Rate:  125 PR Interval:  162 QRS Duration: 92 QT Interval:  304 QTC Calculation: 438 R Axis:   -27 Text Interpretation:  Sinus tachycardia Minimal voltage criteria for LVH,  may be normal variant Septal infarct , age undetermined Since last tracing  rate faster (05 May 2014) Confirmed by [facility]  MD-I, IVA ([id number]) on  11/10/2015 12:42:18 AM      MDM   Final diagnoses:  Nausea  Fever and chills    Pt with nausea without emesis, chills with fever, possibly foodborne, although wife ate at same restaurant (ordered different type of chicken though) but does not have similar sx.  Pt  feels much better at present and tolerated PO fluids.  He remains tachycardic, denies cp, denies palpitations.  Ordered cbg and repeat VS.  When nurse returned to room, patient had eloped.     Evalee Jefferson, PA-C 11/10/15 [id number]  Rolland Porter, MD 11/10/15 [phone]

## 2015-11-09 NOTE — ED Notes (Signed)
Patient complaining of nausea and weakness after eating last night. Also complaining of fever of 102. Denies tylenol or ibuprofen today. Spouse states she ate same food with no symptoms. Patient denies pain.

## 2015-11-09 NOTE — ED Notes (Signed)
Gave patient diet ginger ale as requested and approved by Evalee Jefferson PA.

## 2015-11-09 NOTE — ED Notes (Signed)
Pt states he feels better at present, even feels like his appetite has returned.

## 2015-11-10 NOTE — ED Notes (Signed)
Went to room to update patient's vitals, patient not in room. Family member that was at bedside also not in room. Patient had stated previously "I'm feeling better and I'm ready to go home."

## 2015-11-28 DIAGNOSIS — E119 Type 2 diabetes mellitus without complications: Secondary | ICD-10-CM | POA: Diagnosis not present

## 2015-12-07 DIAGNOSIS — K759 Inflammatory liver disease, unspecified: Secondary | ICD-10-CM | POA: Diagnosis not present

## 2015-12-07 DIAGNOSIS — F1011 Alcohol abuse, in remission: Secondary | ICD-10-CM | POA: Insufficient documentation

## 2015-12-07 DIAGNOSIS — Z87898 Personal history of other specified conditions: Secondary | ICD-10-CM | POA: Diagnosis not present

## 2015-12-07 DIAGNOSIS — D696 Thrombocytopenia, unspecified: Secondary | ICD-10-CM | POA: Diagnosis not present

## 2015-12-07 DIAGNOSIS — D759 Disease of blood and blood-forming organs, unspecified: Secondary | ICD-10-CM | POA: Diagnosis not present

## 2015-12-07 HISTORY — DX: Inflammatory liver disease, unspecified: K75.9

## 2015-12-13 DIAGNOSIS — K759 Inflammatory liver disease, unspecified: Secondary | ICD-10-CM | POA: Diagnosis not present

## 2015-12-13 DIAGNOSIS — Z9049 Acquired absence of other specified parts of digestive tract: Secondary | ICD-10-CM | POA: Diagnosis not present

## 2015-12-13 DIAGNOSIS — K7689 Other specified diseases of liver: Secondary | ICD-10-CM | POA: Diagnosis not present

## 2015-12-13 DIAGNOSIS — D759 Disease of blood and blood-forming organs, unspecified: Secondary | ICD-10-CM | POA: Diagnosis not present

## 2015-12-13 DIAGNOSIS — R161 Splenomegaly, not elsewhere classified: Secondary | ICD-10-CM | POA: Diagnosis not present

## 2016-01-11 DIAGNOSIS — K759 Inflammatory liver disease, unspecified: Secondary | ICD-10-CM | POA: Diagnosis not present

## 2016-01-11 DIAGNOSIS — R161 Splenomegaly, not elsewhere classified: Secondary | ICD-10-CM | POA: Diagnosis not present

## 2016-01-11 DIAGNOSIS — Z87898 Personal history of other specified conditions: Secondary | ICD-10-CM | POA: Diagnosis not present

## 2016-01-11 DIAGNOSIS — D696 Thrombocytopenia, unspecified: Secondary | ICD-10-CM | POA: Diagnosis not present

## 2016-01-11 DIAGNOSIS — R748 Abnormal levels of other serum enzymes: Secondary | ICD-10-CM | POA: Diagnosis not present

## 2016-02-28 DIAGNOSIS — E119 Type 2 diabetes mellitus without complications: Secondary | ICD-10-CM | POA: Diagnosis not present

## 2016-06-12 DIAGNOSIS — Z23 Encounter for immunization: Secondary | ICD-10-CM | POA: Diagnosis not present

## 2016-11-03 DIAGNOSIS — H40033 Anatomical narrow angle, bilateral: Secondary | ICD-10-CM | POA: Diagnosis not present

## 2016-11-03 DIAGNOSIS — H2513 Age-related nuclear cataract, bilateral: Secondary | ICD-10-CM | POA: Diagnosis not present

## 2016-11-19 DIAGNOSIS — I10 Essential (primary) hypertension: Secondary | ICD-10-CM | POA: Diagnosis not present

## 2016-11-19 DIAGNOSIS — Z0001 Encounter for general adult medical examination with abnormal findings: Secondary | ICD-10-CM | POA: Diagnosis not present

## 2016-11-19 DIAGNOSIS — E782 Mixed hyperlipidemia: Secondary | ICD-10-CM | POA: Diagnosis not present

## 2016-11-19 DIAGNOSIS — E1165 Type 2 diabetes mellitus with hyperglycemia: Secondary | ICD-10-CM | POA: Diagnosis not present

## 2016-11-19 DIAGNOSIS — E785 Hyperlipidemia, unspecified: Secondary | ICD-10-CM | POA: Diagnosis not present

## 2016-11-19 DIAGNOSIS — E1140 Type 2 diabetes mellitus with diabetic neuropathy, unspecified: Secondary | ICD-10-CM | POA: Diagnosis not present

## 2016-11-19 DIAGNOSIS — Z1389 Encounter for screening for other disorder: Secondary | ICD-10-CM | POA: Diagnosis not present

## 2016-11-19 DIAGNOSIS — R201 Hypoesthesia of skin: Secondary | ICD-10-CM | POA: Diagnosis not present

## 2016-11-19 DIAGNOSIS — Z125 Encounter for screening for malignant neoplasm of prostate: Secondary | ICD-10-CM | POA: Diagnosis not present

## 2016-11-19 DIAGNOSIS — Z0000 Encounter for general adult medical examination without abnormal findings: Secondary | ICD-10-CM | POA: Diagnosis not present

## 2016-11-24 DIAGNOSIS — D649 Anemia, unspecified: Secondary | ICD-10-CM | POA: Diagnosis not present

## 2016-11-27 DIAGNOSIS — Z1211 Encounter for screening for malignant neoplasm of colon: Secondary | ICD-10-CM | POA: Diagnosis not present

## 2016-12-04 ENCOUNTER — Encounter: Payer: Self-pay | Admitting: Internal Medicine

## 2016-12-04 DIAGNOSIS — D649 Anemia, unspecified: Secondary | ICD-10-CM | POA: Diagnosis not present

## 2016-12-04 DIAGNOSIS — E119 Type 2 diabetes mellitus without complications: Secondary | ICD-10-CM | POA: Diagnosis not present

## 2016-12-26 ENCOUNTER — Encounter: Payer: Self-pay | Admitting: Internal Medicine

## 2016-12-26 ENCOUNTER — Ambulatory Visit (INDEPENDENT_AMBULATORY_CARE_PROVIDER_SITE_OTHER): Payer: Medicare Other | Admitting: Internal Medicine

## 2016-12-26 ENCOUNTER — Other Ambulatory Visit: Payer: Self-pay

## 2016-12-26 VITALS — BP 158/81 | HR 78 | Temp 96.4°F | Ht 71.0 in | Wt 196.0 lb

## 2016-12-26 DIAGNOSIS — K219 Gastro-esophageal reflux disease without esophagitis: Secondary | ICD-10-CM

## 2016-12-26 DIAGNOSIS — D509 Iron deficiency anemia, unspecified: Secondary | ICD-10-CM

## 2016-12-26 DIAGNOSIS — R195 Other fecal abnormalities: Secondary | ICD-10-CM

## 2016-12-26 DIAGNOSIS — D508 Other iron deficiency anemias: Secondary | ICD-10-CM

## 2016-12-26 NOTE — Progress Notes (Signed)
Primary Care Physician:  [name], MD Primary Gastroenterologist:  Dr. Gala Romney Pre-Procedure History & Physical: HPI:  Derek Drake is a 71 y.o. male here for for further evaluation of Hemoccult positive stool and iron deficiency anemia. Apparently, on routine health screening recently, patient found be mildly anemic and with H&H 12.2 and 35.8 MCV 82 further studies performed revealed a serum ferritin of 18 TIBC 486 and serum iron of 91 which iron saturation of lower limit of normal at 19.  Patient denies any melena or hematochezia. He has a long long history of GERD for which he takes omeprazole 20 mg daily. He denies ever having an upper endoscopy. However, cannot find a EGD report in the medical record. He did have a colonoscopy about 4 years ago here which revealed a normal ileo-colon.  He states his reflux symptoms are well controlled. Denies abdominal pain, nausea, vomiting odynophagia or dysphagia.  He takes Eliquis now for atrial fibrillation.  Past Medical History:  Diagnosis Date  . Essential hypertension, benign   . GERD (gastroesophageal reflux disease)   . Hypercholesteremia   . PAF (paroxysmal atrial fibrillation) ([place]) 05/04/2014  . Type 2 diabetes mellitus ([place])     Past Surgical History:  Procedure Laterality Date  . CHOLECYSTECTOMY    . COLONOSCOPY N/A 05/13/2013   Procedure: COLONOSCOPY;  Surgeon: Daneil Dolin, MD;  [facility];  Service: Endoscopy;  Laterality: N/A;  8:30 AM    Prior to Admission medications   Medication Sig Start Date End Date Taking? Authorizing Provider  apixaban (ELIQUIS) 5 MG TABS tablet Take 1 tablet (5 mg total) by mouth 2 (two) times daily. 05/05/14  Yes [name], MD  CARTIA XT 240 MG 24 hr capsule Take 240 mg by mouth daily.  08/02/15  Yes Historical Provider, MD  CINNAMON PO Take 1 capsule by mouth daily.    Yes Historical Provider, MD  Ferrous Gluconate (IRON 27 PO) Take 1 tablet by mouth daily.   Yes  Historical Provider, MD  glipiZIDE (GLUCOTROL) 5 MG tablet Take 5 mg by mouth daily before breakfast.  02/09/15  Yes Historical Provider, MD  [name] 1000 MG CAPS Take 1 capsule by mouth daily.   Yes Historical Provider, MD  losartan (COZAAR) 25 MG tablet Take 25 mg by mouth daily. 11/06/15  Yes Historical Provider, MD  metFORMIN (GLUCOPHAGE) 500 MG tablet Take 1,000 mg by mouth 2 (two) times daily with a meal.   Yes Historical Provider, MD  Multiple Vitamin (MULTIVITAMIN) capsule Take 1 capsule by mouth daily.   Yes Historical Provider, MD  omeprazole (PRILOSEC) 20 MG capsule Take 20 mg by mouth daily.   Yes Historical Provider, MD  simvastatin (ZOCOR) 40 MG tablet Take 40 mg by mouth every morning.    Yes Historical Provider, MD    Allergies as of 12/26/2016 - Review Complete 12/26/2016  Allergen Reaction Noted  . Rabbit protein Swelling 05/13/2013  . Other Swelling 05/13/2013    Family History  Problem Relation Age of Onset  . Prostate cancer Brother   . Colon cancer Neg Hx     Social History   Social History  . Marital status: Married    Spouse name: N/A  . Number of children: N/A  . Years of education: N/A   Occupational History  . Not on file.   Social History Main Topics  . Smoking status: Former Smoker    Packs/day: 2.00    Years: 10.00  Types: Cigarettes    Start date: 05/25/1956    Quit date: 05/25/2009  . Smokeless tobacco: Current User    Types: Chew  . Alcohol use 0.0 oz/week     Comment: Rare - 1 beer every 3 months  . Drug use: No  . Sexual activity: Not on file   Other Topics Concern  . Not on file   Social History Narrative  . No narrative on file    Review of Systems: See HPI, otherwise negative ROS  Physical Exam: BP (!) 158/81   Pulse 78   Temp (!) 96.4 F (35.8 C) (Oral)   Ht 5\' 11"  (1.803 m)   Wt 196 lb (88.9 kg)   BMI 27.34 kg/m  General:   Alert,   well-nourished, pleasant and cooperative in NAD;  Accompanied by his  wife. Neck:  Supple; no masses or thyromegaly. No significant cervical adenopathy. Lungs:  Clear throughout to auscultation.   No wheezes, crackles, or rhonchi. No acute distress. Heart:  Regular rate and rhythm; no murmurs, clicks, rubs,  or gallops. Abdomen: Non-distended, normal bowel sounds.  Soft and nontender without appreciable mass or hepatosplenomegaly.  Pulses:  Normal pulses noted. Extremities:  Without clubbing or edema.  Impression;  Pleasant 71 year old gentleman referred for occult blood in his stool and iron deficiency anemia now on anticoagulation therapy chronically. Has long-standing GERD controlled with PPI but no prior evaluation of his upper GI tract.  It's been almost 4 years since he had his colon examined.  Differential for iron deficiency anemia and Hemoccult positive stool is broad in this clinical setting.  I recommend he go ahead and have both his upper and lower GI tract evaluated at this time  Recommendations:  To this end, I have recommended both an EGD and colonoscopy. The risks, benefits, limitations, imponderables and alternatives regarding both EGD and colonoscopy have been reviewed with the patient. Questions have been answered. All parties agreeable.   We'll premedicate to augment conscious sedation with low dose of Phenergan.  Further recommendations to follow.  No glucophage or glipizide day of procedure  Decrease glucophage to 500 mg night before procedure  Patient instructed to take BP medication with small amount of water day of procedure  Stop Iron 5 days for procedure  Stop Eliquis ( none to be taken the 2 days before or the day of the procedure)        Notice: This dictation was prepared with Dragon dictation along with smaller phrase technology. Any transcriptional errors that result from this process are unintentional and may not be corrected upon review.

## 2016-12-26 NOTE — Patient Instructions (Addendum)
Schedule a diagnostic EGD and colonoscopy - IDA, longstanding GERD and hemocult positive stool  Conscious sedation; split prep  Pre-medicate with phenergan 6.25 mg IV  Do not take glucophage or glipizide day of procedure  Decrease glucophage to 500 mg night before procedure  Take your BP medication with small amount of water day of procedure  Stop Iron 5 days for procedure  Stop Eliquis (no none to be taken the 2 days before or the day of the procedure)  Further recommendations to follow

## 2016-12-29 ENCOUNTER — Telehealth: Payer: Self-pay

## 2016-12-29 NOTE — Telephone Encounter (Signed)
Had called pt and LMOVM to call office. Pt's wife called. TCS/EGD with RMR 02/05/17 changed to 12:00pm (d/t RMR will be in OR that morning). New instructions mailed. LMOVM and informed Endo scheduler.

## 2017-01-12 ENCOUNTER — Telehealth: Payer: Self-pay

## 2017-01-12 NOTE — Telephone Encounter (Signed)
Pt's wife called office. Informed of time change. Advised to start drinking prep morning of procedure at 8:45am and complete by 10:45am. Endo scheduler already aware.

## 2017-01-12 NOTE — Telephone Encounter (Signed)
Time changed for tcs/egd scheduled for 02/05/17. New procedure time 12:45pm, pt to arrive at 11:45am. Tried to call pt, LMOAM for him to call office.

## 2017-02-05 ENCOUNTER — Encounter (HOSPITAL_COMMUNITY)
Admission: RE | Disposition: A | Discharge status: Home / Self Care | Payer: Self-pay | Source: Ambulatory Visit | Attending: Internal Medicine

## 2017-02-05 ENCOUNTER — Ambulatory Visit (HOSPITAL_COMMUNITY)
Admission: RE | Admit: 2017-02-05 | Discharge: 2017-02-05 | Disposition: A | Discharge status: Home / Self Care | Payer: Medicare Other | Source: Ambulatory Visit | Attending: Internal Medicine | Admitting: Internal Medicine

## 2017-02-05 ENCOUNTER — Encounter (HOSPITAL_COMMUNITY): Payer: Self-pay | Admitting: *Deleted

## 2017-02-05 ENCOUNTER — Other Ambulatory Visit: Payer: Self-pay

## 2017-02-05 DIAGNOSIS — K921 Melena: Secondary | ICD-10-CM | POA: Diagnosis not present

## 2017-02-05 DIAGNOSIS — E119 Type 2 diabetes mellitus without complications: Secondary | ICD-10-CM | POA: Insufficient documentation

## 2017-02-05 DIAGNOSIS — Z87891 Personal history of nicotine dependence: Secondary | ICD-10-CM | POA: Insufficient documentation

## 2017-02-05 DIAGNOSIS — R12 Heartburn: Secondary | ICD-10-CM

## 2017-02-05 DIAGNOSIS — K633 Ulcer of intestine: Secondary | ICD-10-CM | POA: Insufficient documentation

## 2017-02-05 DIAGNOSIS — Z7984 Long term (current) use of oral hypoglycemic drugs: Secondary | ICD-10-CM | POA: Diagnosis not present

## 2017-02-05 DIAGNOSIS — K449 Diaphragmatic hernia without obstruction or gangrene: Secondary | ICD-10-CM | POA: Insufficient documentation

## 2017-02-05 DIAGNOSIS — K2970 Gastritis, unspecified, without bleeding: Secondary | ICD-10-CM | POA: Diagnosis not present

## 2017-02-05 DIAGNOSIS — I10 Essential (primary) hypertension: Secondary | ICD-10-CM | POA: Insufficient documentation

## 2017-02-05 DIAGNOSIS — Z7902 Long term (current) use of antithrombotics/antiplatelets: Secondary | ICD-10-CM | POA: Diagnosis not present

## 2017-02-05 DIAGNOSIS — D1800 Hemangioma unspecified site: Secondary | ICD-10-CM | POA: Insufficient documentation

## 2017-02-05 DIAGNOSIS — Z79899 Other long term (current) drug therapy: Secondary | ICD-10-CM | POA: Diagnosis not present

## 2017-02-05 DIAGNOSIS — K219 Gastro-esophageal reflux disease without esophagitis: Secondary | ICD-10-CM | POA: Insufficient documentation

## 2017-02-05 DIAGNOSIS — E7800 Pure hypercholesterolemia, unspecified: Secondary | ICD-10-CM | POA: Diagnosis not present

## 2017-02-05 DIAGNOSIS — K6389 Other specified diseases of intestine: Secondary | ICD-10-CM | POA: Diagnosis not present

## 2017-02-05 DIAGNOSIS — K2950 Unspecified chronic gastritis without bleeding: Secondary | ICD-10-CM | POA: Insufficient documentation

## 2017-02-05 DIAGNOSIS — D509 Iron deficiency anemia, unspecified: Secondary | ICD-10-CM | POA: Diagnosis not present

## 2017-02-05 DIAGNOSIS — I8500 Esophageal varices without bleeding: Secondary | ICD-10-CM

## 2017-02-05 DIAGNOSIS — K5190 Ulcerative colitis, unspecified, without complications: Secondary | ICD-10-CM | POA: Diagnosis not present

## 2017-02-05 DIAGNOSIS — I480 Paroxysmal atrial fibrillation: Secondary | ICD-10-CM | POA: Diagnosis not present

## 2017-02-05 DIAGNOSIS — R195 Other fecal abnormalities: Secondary | ICD-10-CM

## 2017-02-05 HISTORY — PX: ESOPHAGOGASTRODUODENOSCOPY: SHX5428

## 2017-02-05 HISTORY — PX: POLYPECTOMY: SHX5525

## 2017-02-05 HISTORY — PX: COLONOSCOPY: SHX5424

## 2017-02-05 HISTORY — PX: BIOPSY: SHX5522

## 2017-02-05 LAB — GLUCOSE, CAPILLARY: Glucose-Capillary: 134 mg/dL — ABNORMAL HIGH (ref 65–99)

## 2017-02-05 SURGERY — EGD (ESOPHAGOGASTRODUODENOSCOPY)
Anesthesia: Moderate Sedation

## 2017-02-05 MED ORDER — MEPERIDINE HCL 100 MG/ML IJ SOLN
INTRAMUSCULAR | Status: AC
  Filled 2017-02-05: qty 2

## 2017-02-05 MED ORDER — STERILE WATER FOR IRRIGATION IR SOLN
Status: DC | PRN
  Administered 2017-02-05: 13:00:00

## 2017-02-05 MED ORDER — LIDOCAINE VISCOUS 2 % MT SOLN
OROMUCOSAL | Status: AC
  Filled 2017-02-05: qty 15

## 2017-02-05 MED ORDER — LIDOCAINE VISCOUS 2 % MT SOLN
OROMUCOSAL | Status: DC | PRN
Start: 2017-02-05 — End: 2017-02-05
  Administered 2017-02-05: 3 mL via OROMUCOSAL

## 2017-02-05 MED ORDER — ONDANSETRON HCL 4 MG/2ML IJ SOLN
INTRAMUSCULAR | Status: DC | PRN
  Administered 2017-02-05: 4 mg via INTRAVENOUS

## 2017-02-05 MED ORDER — ONDANSETRON HCL 4 MG/2ML IJ SOLN
INTRAMUSCULAR | Status: AC
  Filled 2017-02-05: qty 2

## 2017-02-05 MED ORDER — MIDAZOLAM HCL 5 MG/5ML IJ SOLN
INTRAMUSCULAR | Status: DC | PRN
  Administered 2017-02-05: 1 mg via INTRAVENOUS
  Administered 2017-02-05 (×3): 2 mg via INTRAVENOUS

## 2017-02-05 MED ORDER — MEPERIDINE HCL 100 MG/ML IJ SOLN
INTRAMUSCULAR | Status: DC | PRN
  Administered 2017-02-05 (×2): 25 mg via INTRAVENOUS
  Administered 2017-02-05: 50 mg via INTRAVENOUS

## 2017-02-05 MED ORDER — SODIUM CHLORIDE 0.9 % IV SOLN
INTRAVENOUS | Status: DC
  Administered 2017-02-05: 1000 mL via INTRAVENOUS

## 2017-02-05 MED ORDER — MIDAZOLAM HCL 5 MG/5ML IJ SOLN
INTRAMUSCULAR | Status: AC
  Filled 2017-02-05: qty 10

## 2017-02-05 NOTE — Op Note (Signed)
[facility] Patient Name: Derek Drake Procedure Date: 02/05/2017 1:18 PM MRN: [id number] Date of Birth: 1945-12-02 Attending MD: Norvel Richards , MD CSN: [id number] Age: 71 Admit Type: Outpatient Procedure:                Ileo-colonoscopy Indications:              Hemocult positive Iron deficiency anemia Providers:                Norvel Richards, MD, Jeanann Lewandowsky. Gwenlyn Perking RN, RN,                            Randa Spike, Technician Referring MD:              Medicines:                Midazolam 7 mg IV, Meperidine 100 mg IV,                            Ondansetron 4 mg IV Complications:            No immediate complications. Estimated Blood Loss:     Estimated blood loss was minimal. Procedure:                Pre-Anesthesia Assessment:                           - Prior to the procedure, a History and Physical                            was performed, and patient medications and                            allergies were reviewed. The patient's tolerance of                            previous anesthesia was also reviewed. The risks                            and benefits of the procedure and the sedation                            options and risks were discussed with the patient.                            All questions were answered, and informed consent                            was obtained. Prior Anticoagulants: The patient                            last took Eliquis (apixaban) 3 days prior to the                            procedure. ASA Grade Assessment: III - A patient  with severe systemic disease. After reviewing the                            risks and benefits, the patient was deemed in                            satisfactory condition to undergo the procedure.                           After obtaining informed consent, the colonoscope                            was passed under direct vision. Throughout the   procedure, the patient's blood pressure, pulse, and                            oxygen saturations were monitored continuously. The                            EC-[id number] ([id number]) scope was introduced through                            the anus and advanced to the 5 cm into the ileum.                            The colonoscopy was performed without difficulty.                            The patient tolerated the procedure well. The                            entire colon was well visualized. The terminal                            ileum, ileocecal valve, appendiceal orifice, and                            rectum were photographed. The quality of the bowel                            preparation was adequate. Scope In: 1:21:16 PM Scope Out: 1:42:13 PM Scope Withdrawal Time: 0 hours 17 minutes 56 seconds  Total Procedure Duration: 0 hours 20 minutes 57 seconds  Findings:      The perianal and digital rectal examinations were normal.      A 4 mm polyp was found in the descending colon. The polyp was       semi-pedunculated. The polyp was removed with a cold snare. Resection       and retrieval were complete. Estimated blood loss was minimal.      Ulcerated mucosa with no stigmata of recent bleeding were present in the       descending colon. This was biopsied with a cold forceps for histology.       Estimated blood loss was minimal. Scattered erosions in the distal 5 cm  of terminal ileal mucosa.      The exam was otherwise without abnormality on direct and retroflexion       views. Impression:               - One 4 mm polyp in the descending colon, removed                            with a cold snare. Resected and retrieved.                           - Mucosal ulceration. Biopsied. Ileal erosions.                           - The examination was otherwise normal on direct                            and retroflexion views. Moderate Sedation:      Moderate (conscious) sedation was  administered by the endoscopy nurse       and supervised by the endoscopist. The following parameters were       monitored: oxygen saturation, heart rate, blood pressure, respiratory       rate, EKG, adequacy of pulmonary ventilation, and response to care.       Total physician intraservice time was 43 minutes. Recommendation:           - Resume previous diet.                           - Continue present medications.                           - Repeat colonoscopy date to be determined after                            pending pathology results are reviewed for                            surveillance based on pathology results.                           - Return to GI clinic in 1 month. See EGD report                           - Patient has a contact number available for                            emergencies. The signs and symptoms of potential                            delayed complications were discussed with the                            patient. Return to normal activities tomorrow.                            Written discharge  instructions were provided to the                            patient. Procedure Code(s):        --- Professional ---                           [phone], Colonoscopy, flexible; with removal of                            tumor(s), polyp(s), or other lesion(s) by snare                            technique                           45380, 59, Colonoscopy, flexible; with biopsy,                            single or multiple                           99152, Moderate sedation services provided by the                            same physician or other qualified health care                            professional performing the diagnostic or                            therapeutic service that the sedation supports,                            requiring the presence of an independent trained                            observer to assist in the monitoring of the                             patient's level of consciousness and physiological                            status; initial 15 minutes of intraservice time,                            patient age 42 years or older                           [phone], Moderate sedation services; each additional                            15 minutes intraservice time                           99153, Moderate sedation services; each additional  15 minutes intraservice time Diagnosis Code(s):        --- Professional ---                           D12.4, Benign neoplasm of descending colon                           K63.3, Ulcer of intestine                           K92.1, Melena (includes Hematochezia)                           D50.9, Iron deficiency anemia, unspecified CPT copyright 2016 [employer]. All rights reserved. The codes documented in this report are preliminary and upon coder review may  be revised to meet current compliance requirements. Cristopher Estimable. , MD Norvel Richards, MD 02/05/2017 1:56:30 PM This report has been signed electronically. Number of Addenda: 0

## 2017-02-05 NOTE — H&P (Signed)
@LOGO @   Primary Care Physician:  [name], MD Primary Gastroenterologist:  Dr. Gala Romney  Pre-Procedure History & Physical: HPI:  Derek Drake is a 71 y.o. male here for long-standing GERD. Hemoccult-positive and iron deficiency noted recently. No dysphagia. No prior evaluations upper GI tract. Last colonoscopy a good 40 years ago. Eliquis stopped 3 days ago.  Past Medical History:  Diagnosis Date  . Essential hypertension, benign   . GERD (gastroesophageal reflux disease)   . Hypercholesteremia   . PAF (paroxysmal atrial fibrillation) ([place]) 05/04/2014  . Type 2 diabetes mellitus ([place])     Past Surgical History:  Procedure Laterality Date  . CHOLECYSTECTOMY    . COLONOSCOPY N/A 05/13/2013   Procedure: COLONOSCOPY;  Surgeon: Daneil Dolin, MD;  Location[facility];  Service: Endoscopy;  Laterality: N/A;  8:30 AM    Prior to Admission medications   Medication Sig Start Date End Date Taking? Authorizing Provider  apixaban (ELIQUIS) 5 MG TABS tablet Take 1 tablet (5 mg total) by mouth 2 (two) times daily. 05/05/14  Yes [name], MD  CINNAMON PO Take 1,000 mg by mouth daily.    Yes [provider]  diltiazem (CARDIZEM CD) 240 MG 24 hr capsule Take 240 mg by mouth daily.   Yes [provider]  Ferrous Sulfate 27 MG TABS Take 27 mg by mouth daily.   Yes [provider]  glipiZIDE (GLUCOTROL) 5 MG tablet Take 5 mg by mouth daily as needed (high blood sugar).  02/09/15  Yes [provider]  [name] 350 MG CAPS Take 350 mg by mouth daily.   Yes [provider]  losartan (COZAAR) 50 MG tablet Take 50 mg by mouth daily.   Yes [provider]  metFORMIN (GLUCOPHAGE) 500 MG tablet Take 1,000 mg by mouth 2 (two) times daily with a meal.   Yes [provider]  Multiple Vitamin (MULTIVITAMIN WITH MINERALS) TABS tablet Take 1 tablet by mouth daily.   Yes [provider]  omeprazole (PRILOSEC) 20 MG capsule  Take 20 mg by mouth daily.   Yes [provider]  Phenylephrine-APAP-Guaifenesin (TYLENOL SINUS SEVERE PO) Take 2 tablets by mouth daily as needed (sinuses).   Yes [provider]  simvastatin (ZOCOR) 40 MG tablet Take 40 mg by mouth every morning.    Yes [provider]    Allergies as of 12/26/2016 - Review Complete 12/26/2016  Allergen Reaction Noted  . Rabbit protein Swelling 05/13/2013  . Other Swelling 05/13/2013    Family History  Problem Relation Age of Onset  . Prostate cancer Brother   . Congestive Heart Failure Mother   . Colon cancer Neg Hx     Social History   Social History  . Marital status: Married    Spouse name: N/A  . Number of children: N/A  . Years of education: N/A   Occupational History  . Not on file.   Social History Main Topics  . Smoking status: Former Smoker    Packs/day: 2.00    Years: 10.00    Types: Cigarettes    Start date: 05/25/1956    Quit date: 05/25/2009  . Smokeless tobacco: Current User    Types: Chew  . Alcohol use 0.0 oz/week     Comment: Rare - 1 beer every 3 months  . Drug use: No  . Sexual activity: Not on file   Other Topics Concern  . Not on file   Social History Narrative  .  No narrative on file    Review of Systems: See HPI, otherwise negative ROS  Physical Exam: BP (!) 155/84   Pulse 73   Temp 97.8 F (36.6 C) (Oral)   Resp 12   Ht 5\' 11"  (1.803 m)   Wt 197 lb (89.4 kg)   SpO2 99%   BMI 27.48 kg/m  General:   Alert,  Well-developed, well-nourished, pleasant and cooperative in NAD Neck:  Supple; no masses or thyromegaly. No significant cervical adenopathy. Lungs:  Clear throughout to auscultation.   No wheezes, crackles, or rhonchi. No acute distress. Heart:  Regular rate and rhythm; no murmurs, clicks, rubs,  or gallops. Abdomen: Non-distended, normal bowel sounds.  Soft and nontender without appreciable mass or hepatosplenomegaly.  Pulses:  Normal pulses noted. Extremities:   Without clubbing or edema.  Impression:  Pleasant 71 year old gentleman long-standing GERD without prior evaluations upper GI tract. Iron deficiency anemia along with a Hemoccult-positive stool noted recently. No dysphagia.  Recommendations:  Diagnostic EGD and colonoscopy today per plan.  The risks, benefits, limitations, imponderables and alternatives regarding both EGD and colonoscopy have been reviewed with the patient. Questions have been answered. All parties agreeable.    Notice: This dictation was prepared with Dragon dictation along with smaller phrase technology. Any transcriptional errors that result from this process are unintentional and may not be corrected upon review.

## 2017-02-05 NOTE — Op Note (Signed)
[facility] Patient Name: Derek Drake Procedure Date: 02/05/2017 12:25 PM MRN: [id number] Date of Birth: November 03, 1945 Attending MD: Norvel Richards , MD CSN: [id number] Age: 71 Admit Type: Outpatient Procedure:                Upper GI endoscopy Indications:              Heartburn Providers:                Norvel Richards, MD, Jeanann Lewandowsky. Sharon Seller, RN,                            Randa Spike, Technician Referring MD:              Medicines:                Midazolam 6 mg IV, Meperidine 503 mg IV Complications:            No immediate complications. Estimated Blood Loss:     Estimated blood loss was minimal. Procedure:                Pre-Anesthesia Assessment:                           - Prior to the procedure, a History and Physical                            was performed, and patient medications and                            allergies were reviewed. The patient's tolerance of                            previous anesthesia was also reviewed. The risks                            and benefits of the procedure and the sedation                            options and risks were discussed with the patient.                            All questions were answered, and informed consent                            was obtained. Prior Anticoagulants: The patient                            last took Eliquis (apixaban) 3 days prior to the                            procedure. ASA Grade Assessment: III - A patient                            with severe systemic disease. After reviewing the  risks and benefits, the patient was deemed in                            satisfactory condition to undergo the procedure.                           After obtaining informed consent, the endoscope was                            passed under direct vision. Throughout the                            procedure, the patient's blood pressure, pulse, and   oxygen saturations were monitored continuously. The                            [id number] ([id number]) scope was introduced through the                            mouth, and advanced to the second part of duodenum.                            The upper GI endoscopy was accomplished without                            difficulty. The patient tolerated the procedure                            well. Scope In: 1:10:44 PM Scope Out: 1:14:51 PM Total Procedure Duration: 0 hours 4 minutes 7 seconds  Findings:      Grade I varices were found in the middle third of the esophagus.      A small hiatal hernia was present.      Diffuse mild inflammation was found in the stomach.      The duodenal bulb and second portion of the duodenum were normal.       Gastric mucosa was biopsied with a cold forceps for histology. Estimated       blood loss was minimal. Impression:               - Grade I esophageal varices.                           - Small hiatal hernia.                           - Gastritis. Biopsied.                           - Normal duodenal bulb and second portion of the                            duodenum. I wonder about occult underlying liver                            disease with today's findings and history of  thrombocytopenia. Moderate Sedation:      Moderate (conscious) sedation was administered by the endoscopy nurse       and supervised by the endoscopist. The following parameters were       monitored: oxygen saturation, heart rate, blood pressure, respiratory       rate, EKG, adequacy of pulmonary ventilation, and response to care.       Total physician intraservice time was 15 minutes. Recommendation:           - Patient has a contact number available for                            emergencies. The signs and symptoms of potential                            delayed complications were discussed with the                            patient. Return to normal  activities tomorrow.                            Written discharge instructions were provided to the                            patient.                           - Resume previous diet.                           - Continue present medications.                           - No repeat upper endoscopy.                           - Return to GI office in 1 month. See colonoscopy                            report Procedure Code(s):        --- Professional ---                           [phone], Esophagogastroduodenoscopy, flexible,                            transoral; with biopsy, single or multiple                           99152, Moderate sedation services provided by the                            same physician or other qualified health care                            professional performing the diagnostic or  therapeutic service that the sedation supports,                            requiring the presence of an independent trained                            observer to assist in the monitoring of the                            patient's level of consciousness and physiological                            status; initial 15 minutes of intraservice time,                            patient age 7 years or older Diagnosis Code(s):        --- Professional ---                           I85.00, Esophageal varices without bleeding                           K44.9, Diaphragmatic hernia without obstruction or                            gangrene                           K29.70, Gastritis, unspecified, without bleeding                           R12, Heartburn CPT copyright 2016 American Medical Association. All rights reserved. The codes documented in this report are preliminary and upon coder review may  be revised to meet current compliance requirements. Cristopher Estimable. Arden Axon, MD Norvel Richards, MD 02/05/2017 1:51:42 PM This report has been signed electronically. Number of  Addenda: 0

## 2017-02-05 NOTE — Discharge Instructions (Signed)
°Colonoscopy °Discharge Instructions ° °Read the instructions outlined below and refer to this sheet in the next few weeks. These discharge instructions provide you with general information on caring for yourself after you leave the hospital. Your doctor may also give you specific instructions. While your treatment has been planned according to the most current medical practices available, unavoidable complications occasionally occur. If you have any problems or questions after discharge, call Dr. Rourk at [phone]. °ACTIVITY °· You may resume your regular activity, but move at a slower pace for the next 24 hours.  °· Take frequent rest periods for the next 24 hours.  °· Walking will help get rid of the air and reduce the bloated feeling in your belly (abdomen).  °· No driving for 24 hours (because of the medicine (anesthesia) used during the test).   °· Do not sign any important legal documents or operate any machinery for 24 hours (because of the anesthesia used during the test).  °NUTRITION °· Drink plenty of fluids.  °· You may resume your normal diet as instructed by your doctor.  °· Begin with a light meal and progress to your normal diet. Heavy or fried foods are harder to digest and may make you feel sick to your stomach (nauseated).  °· Avoid alcoholic beverages for 24 hours or as instructed.  °MEDICATIONS °· You may resume your normal medications unless your doctor tells you otherwise.  °WHAT YOU CAN EXPECT TODAY °· Some feelings of bloating in the abdomen.  °· Passage of more gas than usual.  °· Spotting of blood in your stool or on the toilet paper.  °IF YOU HAD POLYPS REMOVED DURING THE COLONOSCOPY: °· No aspirin products for 7 days or as instructed.  °· No alcohol for 7 days or as instructed.  °· Eat a soft diet for the next 24 hours.  °FINDING OUT THE RESULTS OF YOUR TEST °Not all test results are available during your visit. If your test results are not back during the visit, make an appointment  with your caregiver to find out the results. Do not assume everything is normal if you have not heard from your caregiver or the medical facility. It is important for you to follow up on all of your test results.  °SEEK IMMEDIATE MEDICAL ATTENTION IF: °· You have more than a spotting of blood in your stool.  °· Your belly is swollen (abdominal distention).  °· You are nauseated or vomiting.  °· You have a temperature over 101.  °· You have abdominal pain or discomfort that is severe or gets worse throughout the day.  °EGD °Discharge instructions °Please read the instructions outlined below and refer to this sheet in the next few weeks. These discharge instructions provide you with general information on caring for yourself after you leave the hospital. Your doctor may also give you specific instructions. While your treatment has been planned according to the most current medical practices available, unavoidable complications occasionally occur. If you have any problems or questions after discharge, please call your doctor. °ACTIVITY °· You may resume your regular activity but move at a slower pace for the next 24 hours.  °· Take frequent rest periods for the next 24 hours.  °· Walking will help expel (get rid of) the air and reduce the bloated feeling in your abdomen.  °· No driving for 24 hours (because of the anesthesia (medicine) used during the test).  °· You may shower.  °· Do not sign any important   legal documents or operate any machinery for 24 hours (because of the anesthesia used during the test).  NUTRITION  Drink plenty of fluids.   You may resume your normal diet.   Begin with a light meal and progress to your normal diet.   Avoid alcoholic beverages for 24 hours or as instructed by your caregiver.  MEDICATIONS  You may resume your normal medications unless your caregiver tells you otherwise.  WHAT YOU CAN EXPECT TODAY  You may experience abdominal discomfort such as a feeling of fullness  or gas pains.  FOLLOW-UP  Your doctor will discuss the results of your test with you.  SEEK IMMEDIATE MEDICAL ATTENTION IF ANY OF THE FOLLOWING OCCUR:  Excessive nausea (feeling sick to your stomach) and/or vomiting.   Severe abdominal pain and distention (swelling).   Trouble swallowing.   Temperature over 101 F (37.8 C).   Rectal bleeding or vomiting of blood.    Colon polyp information provided  Office visit with [place] in one month  Further recommendations to follow pending review of pathology report  Resume Eliquis today.     Colon Polyps Polyps are tissue growths inside the body. Polyps can grow in many places, including the large intestine (colon). A polyp may be a round bump or a mushroom-shaped growth. You could have one polyp or several. Most colon polyps are noncancerous (benign). However, some colon polyps can become cancerous over time. What are the causes? The exact cause of colon polyps is not known. What increases the risk? This condition is more likely to develop in people who:  Have a family history of colon cancer or colon polyps.  Are older than 32 or older than 45 if they are African American.  Have inflammatory bowel disease, such as ulcerative colitis or Crohn disease.  Are overweight.  Smoke cigarettes.  Do not get enough exercise.  Drink too much alcohol.  Eat a diet that is:  High in fat and red meat.  Low in fiber.  Had childhood cancer that was treated with abdominal radiation. What are the signs or symptoms? Most polyps do not cause symptoms. If you have symptoms, they may include:  Blood coming from your rectum when having a bowel movement.  Blood in your stool.The stool may look dark red or black.  A change in bowel habits, such as constipation or diarrhea. How is this diagnosed? This condition is diagnosed with a colonoscopy. This is a procedure that uses a lighted, flexible scope to look at the inside of your  colon. How is this treated? Treatment for this condition involves removing any polyps that are found. Those polyps will then be tested for cancer. If cancer is found, your health care provider will talk to you about options for colon cancer treatment. Follow these instructions at home: Diet   Eat plenty of fiber, such as fruits, vegetables, and whole grains.  Eat foods that are high in calcium and vitamin D, such as milk, cheese, yogurt, eggs, liver, fish, and broccoli.  Limit foods high in fat, red meats, and processed meats, such as hot dogs, sausage, bacon, and lunch meats.  Maintain a healthy weight, or lose weight if recommended by your health care provider. General instructions   Do not smoke cigarettes.  Do not drink alcohol excessively.  Keep all follow-up visits as told by your health care provider. This is important. This includes keeping regularly scheduled colonoscopies. Talk to your health care provider about when you need a colonoscopy.  Exercise every day or as told by your health care provider. Contact a health care provider if:  You have new or worsening bleeding during a bowel movement.  You have new or increased blood in your stool.  You have a change in bowel habits.  You unexpectedly lose weight. This information is not intended to replace advice given to you by your health care provider. Make sure you discuss any questions you have with your health care provider. Document Released: 05/28/2004 Document Revised: 02/07/2016 Document Reviewed: 07/23/2015 Elsevier Interactive Patient Education  2017 [employer].

## 2017-02-09 ENCOUNTER — Encounter: Payer: Self-pay | Admitting: Internal Medicine

## 2017-02-13 ENCOUNTER — Encounter (HOSPITAL_COMMUNITY): Payer: Self-pay | Admitting: Internal Medicine

## 2017-03-09 ENCOUNTER — Ambulatory Visit: Payer: Medicare Other | Admitting: Nurse Practitioner

## 2017-03-13 DIAGNOSIS — E119 Type 2 diabetes mellitus without complications: Secondary | ICD-10-CM | POA: Diagnosis not present

## 2017-03-26 DIAGNOSIS — H6501 Acute serous otitis media, right ear: Secondary | ICD-10-CM | POA: Diagnosis not present

## 2017-03-26 DIAGNOSIS — Z1389 Encounter for screening for other disorder: Secondary | ICD-10-CM | POA: Diagnosis not present

## 2017-04-21 DIAGNOSIS — E119 Type 2 diabetes mellitus without complications: Secondary | ICD-10-CM | POA: Diagnosis not present

## 2017-04-21 DIAGNOSIS — E782 Mixed hyperlipidemia: Secondary | ICD-10-CM | POA: Diagnosis not present

## 2017-04-21 DIAGNOSIS — I10 Essential (primary) hypertension: Secondary | ICD-10-CM | POA: Diagnosis not present

## 2017-04-21 DIAGNOSIS — D509 Iron deficiency anemia, unspecified: Secondary | ICD-10-CM | POA: Diagnosis not present

## 2017-04-21 DIAGNOSIS — L255 Unspecified contact dermatitis due to plants, except food: Secondary | ICD-10-CM | POA: Diagnosis not present

## 2017-04-21 DIAGNOSIS — B351 Tinea unguium: Secondary | ICD-10-CM | POA: Diagnosis not present

## 2017-05-26 DIAGNOSIS — Z23 Encounter for immunization: Secondary | ICD-10-CM | POA: Diagnosis not present

## 2017-06-08 DIAGNOSIS — E119 Type 2 diabetes mellitus without complications: Secondary | ICD-10-CM | POA: Diagnosis not present

## 2017-09-07 DIAGNOSIS — E119 Type 2 diabetes mellitus without complications: Secondary | ICD-10-CM | POA: Diagnosis not present

## 2017-10-27 DIAGNOSIS — Z79899 Other long term (current) drug therapy: Secondary | ICD-10-CM | POA: Diagnosis not present

## 2017-10-27 DIAGNOSIS — R201 Hypoesthesia of skin: Secondary | ICD-10-CM | POA: Diagnosis not present

## 2017-10-27 DIAGNOSIS — E782 Mixed hyperlipidemia: Secondary | ICD-10-CM | POA: Diagnosis not present

## 2017-10-27 DIAGNOSIS — E1142 Type 2 diabetes mellitus with diabetic polyneuropathy: Secondary | ICD-10-CM | POA: Diagnosis not present

## 2017-10-27 DIAGNOSIS — Z1389 Encounter for screening for other disorder: Secondary | ICD-10-CM | POA: Diagnosis not present

## 2017-11-02 DIAGNOSIS — E119 Type 2 diabetes mellitus without complications: Secondary | ICD-10-CM | POA: Diagnosis not present

## 2017-11-02 DIAGNOSIS — H40033 Anatomical narrow angle, bilateral: Secondary | ICD-10-CM | POA: Diagnosis not present

## 2018-01-07 DIAGNOSIS — L02612 Cutaneous abscess of left foot: Secondary | ICD-10-CM | POA: Diagnosis not present

## 2018-01-07 DIAGNOSIS — E139 Other specified diabetes mellitus without complications: Secondary | ICD-10-CM | POA: Diagnosis not present

## 2018-01-07 DIAGNOSIS — M79675 Pain in left toe(s): Secondary | ICD-10-CM | POA: Diagnosis not present

## 2018-01-07 DIAGNOSIS — B353 Tinea pedis: Secondary | ICD-10-CM | POA: Diagnosis not present

## 2018-01-07 DIAGNOSIS — L03032 Cellulitis of left toe: Secondary | ICD-10-CM | POA: Diagnosis not present

## 2018-01-07 DIAGNOSIS — B351 Tinea unguium: Secondary | ICD-10-CM | POA: Diagnosis not present

## 2018-01-13 DIAGNOSIS — B351 Tinea unguium: Secondary | ICD-10-CM | POA: Diagnosis not present

## 2018-01-21 DIAGNOSIS — B353 Tinea pedis: Secondary | ICD-10-CM | POA: Diagnosis not present

## 2018-01-21 DIAGNOSIS — B351 Tinea unguium: Secondary | ICD-10-CM | POA: Diagnosis not present

## 2018-01-21 DIAGNOSIS — L03032 Cellulitis of left toe: Secondary | ICD-10-CM | POA: Diagnosis not present

## 2018-02-04 DIAGNOSIS — B351 Tinea unguium: Secondary | ICD-10-CM | POA: Diagnosis not present

## 2018-02-04 DIAGNOSIS — L03032 Cellulitis of left toe: Secondary | ICD-10-CM | POA: Diagnosis not present

## 2018-02-10 DIAGNOSIS — E119 Type 2 diabetes mellitus without complications: Secondary | ICD-10-CM | POA: Diagnosis not present

## 2018-02-10 DIAGNOSIS — E1140 Type 2 diabetes mellitus with diabetic neuropathy, unspecified: Secondary | ICD-10-CM | POA: Diagnosis not present

## 2018-02-10 DIAGNOSIS — H9313 Tinnitus, bilateral: Secondary | ICD-10-CM | POA: Diagnosis not present

## 2018-02-10 DIAGNOSIS — K219 Gastro-esophageal reflux disease without esophagitis: Secondary | ICD-10-CM | POA: Diagnosis not present

## 2018-02-10 DIAGNOSIS — I10 Essential (primary) hypertension: Secondary | ICD-10-CM | POA: Diagnosis not present

## 2018-02-10 DIAGNOSIS — L718 Other rosacea: Secondary | ICD-10-CM | POA: Diagnosis not present

## 2018-02-10 DIAGNOSIS — R945 Abnormal results of liver function studies: Secondary | ICD-10-CM | POA: Diagnosis not present

## 2018-02-10 DIAGNOSIS — E782 Mixed hyperlipidemia: Secondary | ICD-10-CM | POA: Diagnosis not present

## 2018-03-04 DIAGNOSIS — I4891 Unspecified atrial fibrillation: Secondary | ICD-10-CM | POA: Diagnosis not present

## 2018-03-04 DIAGNOSIS — Z0000 Encounter for general adult medical examination without abnormal findings: Secondary | ICD-10-CM | POA: Diagnosis not present

## 2018-03-04 DIAGNOSIS — K219 Gastro-esophageal reflux disease without esophagitis: Secondary | ICD-10-CM | POA: Diagnosis not present

## 2018-03-04 DIAGNOSIS — Z1389 Encounter for screening for other disorder: Secondary | ICD-10-CM | POA: Diagnosis not present

## 2018-03-04 DIAGNOSIS — I10 Essential (primary) hypertension: Secondary | ICD-10-CM | POA: Diagnosis not present

## 2018-03-22 DIAGNOSIS — R161 Splenomegaly, not elsewhere classified: Secondary | ICD-10-CM | POA: Diagnosis not present

## 2018-03-22 DIAGNOSIS — R945 Abnormal results of liver function studies: Secondary | ICD-10-CM | POA: Diagnosis not present

## 2018-03-22 DIAGNOSIS — Z9049 Acquired absence of other specified parts of digestive tract: Secondary | ICD-10-CM | POA: Diagnosis not present

## 2018-05-20 DIAGNOSIS — B351 Tinea unguium: Secondary | ICD-10-CM | POA: Diagnosis not present

## 2018-06-07 DIAGNOSIS — Z23 Encounter for immunization: Secondary | ICD-10-CM | POA: Diagnosis not present

## 2018-06-08 DIAGNOSIS — E119 Type 2 diabetes mellitus without complications: Secondary | ICD-10-CM | POA: Diagnosis not present

## 2018-06-23 NOTE — Progress Notes (Signed)
Cardiology Office Note  Date: 06/25/2018   ID: Barnabas, Henriques 1946-07-04, MRN [id number]  PCP: [name], MD  Primary Cardiologist: [name], MD   Chief Complaint  Patient presents with  . Atrial Fibrillation    History of Present Illness: Derek Drake is a 72 y.o. male not seen since December 2016.  He presents overdue for follow-up, encouraged to come in for a follow-up check by his wife.  He does not report any progressive sense of palpitations, no exertional chest pain.  He still enjoys playing [place], he plays the guitar and the banjo.  Cardiac medications include Eliquis and Cardizem CD. He continues to follow with [facility] for routine lab work, we are requesting the most recent values.  He does not report any obvious bleeding problems or changes in stool.  I personally reviewed his ECG today which shows normal sinus rhythm.  Past Medical History:  Diagnosis Date  . Essential hypertension, benign   . GERD (gastroesophageal reflux disease)   . Hypercholesteremia   . PAF (paroxysmal atrial fibrillation) ([place]) 05/04/2014  . Type 2 diabetes mellitus ([place])     Past Surgical History:  Procedure Laterality Date  . BIOPSY  02/05/2017   Procedure: BIOPSY;  Surgeon: Daneil Dolin, MD;  [facility];  Service: Endoscopy;;  gastric colon  . CHOLECYSTECTOMY    . COLONOSCOPY N/A 05/13/2013   Procedure: COLONOSCOPY;  Surgeon: Daneil Dolin, MD;  [facility];  Service: Endoscopy;  Laterality: N/A;  8:30 AM  . COLONOSCOPY N/A 02/05/2017   Procedure: COLONOSCOPY;  Surgeon: Daneil Dolin, MD;  [facility];  Service: Endoscopy;  Laterality: N/A;  . ESOPHAGOGASTRODUODENOSCOPY N/A 02/05/2017   Procedure: ESOPHAGOGASTRODUODENOSCOPY (EGD);  Surgeon: Daneil Dolin, MD;  [facility];  Service: Endoscopy;  Laterality: N/A;  730 - moved to 12:00   . POLYPECTOMY  02/05/2017   Procedure: POLYPECTOMY;  Surgeon: Daneil Dolin, MD;  [facility];  Service: Endoscopy;;  colon    Current Outpatient Medications  Medication Sig Dispense Refill  . apixaban (ELIQUIS) 5 MG TABS tablet Take 1 tablet (5 mg total) by mouth 2 (two) times daily. 60 tablet 0  . CINNAMON PO Take 1,000 mg by mouth daily.     Marland Kitchen diltiazem (CARDIZEM CD) 240 MG 24 hr capsule Take 240 mg by mouth daily.    . Ferrous Sulfate 27 MG TABS Take 27 mg by mouth daily.    Marland Kitchen glipiZIDE (GLUCOTROL) 5 MG tablet Take 5 mg by mouth daily as needed (high blood sugar).     [name] 350 MG CAPS Take 350 mg by mouth daily.    Marland Kitchen losartan (COZAAR) 50 MG tablet Take 50 mg by mouth daily.    . metFORMIN (GLUCOPHAGE) 500 MG tablet Take 1,000 mg by mouth 2 (two) times daily with a meal.    . Multiple Vitamin (MULTIVITAMIN WITH MINERALS) TABS tablet Take 1 tablet by mouth daily.    Marland Kitchen omeprazole (PRILOSEC) 20 MG capsule Take 20 mg by mouth daily.    Marland Kitchen Phenylephrine-APAP-Guaifenesin (TYLENOL SINUS SEVERE PO) Take 2 tablets by mouth daily as needed (sinuses).    . simvastatin (ZOCOR) 40 MG tablet Take 40 mg by mouth every morning.      No current facility-administered medications for this visit.    Allergies:  Rabbit protein; Hydrocodone-acetaminophen; Other; and Ivp dye [iodinated diagnostic agents]   Social History: The  patient  reports that he quit smoking about 9 years ago. His smoking use included cigarettes. He started smoking about 62 years ago. He has a 20.00 pack-year smoking history. His smokeless tobacco use includes chew. He reports that he drinks alcohol. He reports that he does not use drugs.   ROS:  Please see the history of present illness. Otherwise, complete review of systems is positive for none.  All other systems are reviewed and negative.   Physical Exam: VS:  BP (!) 142/64   Pulse 88   Ht 5\' 11"  (1.803 m)   Wt 200 lb (90.7 kg)   SpO2 97%   BMI 27.89 kg/m , BMI Body mass index is 27.89 kg/m.  Wt Readings from Last 3  Encounters:  06/25/18 200 lb (90.7 kg)  02/05/17 197 lb (89.4 kg)  12/26/16 196 lb (88.9 kg)    General: Patient appears comfortable at rest. HEENT: Conjunctiva and lids normal, oropharynx clear. Neck: Supple, no elevated JVP or carotid bruits, no thyromegaly. Lungs: Clear to auscultation, nonlabored breathing at rest. Cardiac: Regular rate and rhythm, no S3 or significant systolic murmur. Abdomen: Soft, nontender, bowel sounds present. Extremities: No pitting edema, distal pulses 2+. Skin: Warm and dry. Musculoskeletal: No kyphosis. Neuropsychiatric: Alert and oriented x3, affect grossly appropriate.  ECG: I personally reviewed the tracing from 11/09/2015 which showed sinus tachycardia with increased voltage.  Other Studies Reviewed Today:  Study Conclusions  - Left ventricle: The cavity size was normal. Wall thickness was increased in a pattern of mild LVH. Systolic function was normal. The estimated ejection fraction was in the range of 60% to 65%. Wall motion was normal; there were no regional wall motion abnormalities. Doppler parameters are consistent with abnormal left ventricular relaxation (grade 1 diastolic dysfunction). Doppler parameters are consistent with high ventricular filling pressure. - Aortic valve: Mildly calcified annulus. Trileaflet. There was no significant regurgitation. - Mitral valve: Calcified annulus. There was mild regurgitation. - Left atrium: The atrium was at the upper limits of normal in size. - Right atrium: Central venous pressure (est): 3 mm Hg. - Tricuspid valve: There was mild regurgitation. - Pulmonary arteries: PA peak pressure: 31 mm Hg (S). - Pericardium, extracardiac: There was no pericardial effusion.  Impressions:  - Mild LVH with LVEF 32-44%, grade 1 diastolic dysfunction with increased filling pressures. Upper normal left atrial chamber size. Mild mitral regurgitation. Mild tricuspid regurgitation with  upper normal PASP 31 mm mercury.  Assessment and Plan:  1.  Paroxysmal atrial fibrillation.  He reports no progressive palpitations and is in sinus rhythm today by ECG.  CHADSVASC score is 3 and he remains on Eliquis with lab work followed by [employer].  Continue Cardizem CD at current dose.  2.  Essential hypertension, keep follow-up with [facility].  Current medicines were reviewed with the patient today.   Orders Placed This Encounter  Procedures  . EKG 12-Lead    Disposition: Follow-up in 1 year, sooner if needed.  Signed, Satira Sark, MD, [facility] 06/25/2018 1:38 PM    [facility] [address], [place], [place] [address] Phone: [phone]; Fax: [phone]

## 2018-06-25 ENCOUNTER — Ambulatory Visit: Payer: Medicare Other | Admitting: Cardiology

## 2018-06-25 ENCOUNTER — Encounter: Payer: Self-pay | Admitting: Cardiology

## 2018-06-25 VITALS — BP 142/64 | HR 88 | Ht 71.0 in | Wt 200.0 lb

## 2018-06-25 DIAGNOSIS — I10 Essential (primary) hypertension: Secondary | ICD-10-CM | POA: Diagnosis not present

## 2018-06-25 DIAGNOSIS — I480 Paroxysmal atrial fibrillation: Secondary | ICD-10-CM

## 2018-06-25 NOTE — Patient Instructions (Signed)
Medication Instructions:  Your physician recommends that you continue on your current medications as directed. Please refer to the Current Medication list given to you today.  If you need a refill on your cardiac medications before your next appointment, please call your pharmacy.   Lab work: NONE If you have labs (blood work) drawn today and your tests are completely normal, you will receive your results only by: Marland Kitchen MyChart Message (if you have MyChart) OR . A paper copy in the mail If you have any lab test that is abnormal or we need to change your treatment, we will call you to review the results.  Testing/Procedures: NONE  Follow-Up: At [facility], you and your health needs are our priority.  As part of our continuing mission to provide you with exceptional heart care, we have created designated Provider Care Teams.  These Care Teams include your primary Cardiologist (physician) and Advanced Practice Providers (APPs -  Physician Assistants and Nurse Practitioners) who all work together to provide you with the care you need, when you need it. . You will need a follow up appointment in 1 years.  Please call our office 2 months in advance to schedule this appointment with Dr.McDowell   Any Other Special Instructions Will Be Listed Below (If Applicable). NONE

## 2018-07-02 DIAGNOSIS — D696 Thrombocytopenia, unspecified: Secondary | ICD-10-CM | POA: Diagnosis not present

## 2018-07-02 DIAGNOSIS — Z1389 Encounter for screening for other disorder: Secondary | ICD-10-CM | POA: Diagnosis not present

## 2018-07-02 DIAGNOSIS — K219 Gastro-esophageal reflux disease without esophagitis: Secondary | ICD-10-CM | POA: Diagnosis not present

## 2018-07-02 DIAGNOSIS — E1165 Type 2 diabetes mellitus with hyperglycemia: Secondary | ICD-10-CM | POA: Diagnosis not present

## 2018-07-02 DIAGNOSIS — I10 Essential (primary) hypertension: Secondary | ICD-10-CM | POA: Diagnosis not present

## 2018-07-15 DIAGNOSIS — Z23 Encounter for immunization: Secondary | ICD-10-CM | POA: Diagnosis not present

## 2018-08-16 DIAGNOSIS — I4891 Unspecified atrial fibrillation: Secondary | ICD-10-CM | POA: Diagnosis not present

## 2018-08-16 DIAGNOSIS — L03113 Cellulitis of right upper limb: Secondary | ICD-10-CM | POA: Diagnosis not present

## 2018-08-16 DIAGNOSIS — I10 Essential (primary) hypertension: Secondary | ICD-10-CM | POA: Diagnosis not present

## 2018-09-14 DIAGNOSIS — E119 Type 2 diabetes mellitus without complications: Secondary | ICD-10-CM | POA: Diagnosis not present

## 2018-12-21 DIAGNOSIS — E119 Type 2 diabetes mellitus without complications: Secondary | ICD-10-CM | POA: Diagnosis not present

## 2019-01-10 DIAGNOSIS — I10 Essential (primary) hypertension: Secondary | ICD-10-CM | POA: Diagnosis not present

## 2019-01-10 DIAGNOSIS — E1140 Type 2 diabetes mellitus with diabetic neuropathy, unspecified: Secondary | ICD-10-CM | POA: Diagnosis not present

## 2019-01-10 DIAGNOSIS — Z0001 Encounter for general adult medical examination with abnormal findings: Secondary | ICD-10-CM | POA: Diagnosis not present

## 2019-01-10 DIAGNOSIS — Z1389 Encounter for screening for other disorder: Secondary | ICD-10-CM | POA: Diagnosis not present

## 2019-02-25 DIAGNOSIS — E782 Mixed hyperlipidemia: Secondary | ICD-10-CM | POA: Diagnosis not present

## 2019-02-25 DIAGNOSIS — Z129 Encounter for screening for malignant neoplasm, site unspecified: Secondary | ICD-10-CM | POA: Diagnosis not present

## 2019-02-25 DIAGNOSIS — Z79899 Other long term (current) drug therapy: Secondary | ICD-10-CM | POA: Diagnosis not present

## 2019-02-25 DIAGNOSIS — E1149 Type 2 diabetes mellitus with other diabetic neurological complication: Secondary | ICD-10-CM | POA: Diagnosis not present

## 2019-02-25 DIAGNOSIS — I10 Essential (primary) hypertension: Secondary | ICD-10-CM | POA: Diagnosis not present

## 2019-02-25 DIAGNOSIS — E119 Type 2 diabetes mellitus without complications: Secondary | ICD-10-CM | POA: Diagnosis not present

## 2019-04-20 DIAGNOSIS — Z1389 Encounter for screening for other disorder: Secondary | ICD-10-CM | POA: Diagnosis not present

## 2019-04-20 DIAGNOSIS — I4891 Unspecified atrial fibrillation: Secondary | ICD-10-CM | POA: Diagnosis not present

## 2019-04-20 DIAGNOSIS — G894 Chronic pain syndrome: Secondary | ICD-10-CM | POA: Diagnosis not present

## 2019-04-20 DIAGNOSIS — I10 Essential (primary) hypertension: Secondary | ICD-10-CM | POA: Diagnosis not present

## 2019-04-20 DIAGNOSIS — E119 Type 2 diabetes mellitus without complications: Secondary | ICD-10-CM | POA: Diagnosis not present

## 2019-04-25 DIAGNOSIS — E119 Type 2 diabetes mellitus without complications: Secondary | ICD-10-CM | POA: Diagnosis not present

## 2019-05-18 DIAGNOSIS — Z23 Encounter for immunization: Secondary | ICD-10-CM | POA: Diagnosis not present

## 2019-07-18 ENCOUNTER — Emergency Department (HOSPITAL_COMMUNITY)
Admission: EM | Admit: 2019-07-18 | Discharge: 2019-07-18 | Disposition: A | Discharge status: Home / Self Care | Payer: Medicare Other | Attending: Emergency Medicine | Admitting: Emergency Medicine

## 2019-07-18 ENCOUNTER — Encounter (HOSPITAL_COMMUNITY): Payer: Self-pay | Admitting: Emergency Medicine

## 2019-07-18 ENCOUNTER — Other Ambulatory Visit: Payer: Self-pay

## 2019-07-18 ENCOUNTER — Emergency Department (HOSPITAL_COMMUNITY): Payer: Medicare Other

## 2019-07-18 DIAGNOSIS — Y929 Unspecified place or not applicable: Secondary | ICD-10-CM | POA: Diagnosis not present

## 2019-07-18 DIAGNOSIS — Z79899 Other long term (current) drug therapy: Secondary | ICD-10-CM | POA: Diagnosis not present

## 2019-07-18 DIAGNOSIS — Z7984 Long term (current) use of oral hypoglycemic drugs: Secondary | ICD-10-CM | POA: Insufficient documentation

## 2019-07-18 DIAGNOSIS — Y999 Unspecified external cause status: Secondary | ICD-10-CM | POA: Insufficient documentation

## 2019-07-18 DIAGNOSIS — I10 Essential (primary) hypertension: Secondary | ICD-10-CM | POA: Insufficient documentation

## 2019-07-18 DIAGNOSIS — M25562 Pain in left knee: Secondary | ICD-10-CM | POA: Insufficient documentation

## 2019-07-18 DIAGNOSIS — Z87891 Personal history of nicotine dependence: Secondary | ICD-10-CM | POA: Insufficient documentation

## 2019-07-18 DIAGNOSIS — Z23 Encounter for immunization: Secondary | ICD-10-CM | POA: Diagnosis not present

## 2019-07-18 DIAGNOSIS — M7989 Other specified soft tissue disorders: Secondary | ICD-10-CM | POA: Diagnosis not present

## 2019-07-18 DIAGNOSIS — Y939 Activity, unspecified: Secondary | ICD-10-CM | POA: Insufficient documentation

## 2019-07-18 DIAGNOSIS — I480 Paroxysmal atrial fibrillation: Secondary | ICD-10-CM | POA: Insufficient documentation

## 2019-07-18 DIAGNOSIS — E119 Type 2 diabetes mellitus without complications: Secondary | ICD-10-CM | POA: Diagnosis not present

## 2019-07-18 DIAGNOSIS — Z7901 Long term (current) use of anticoagulants: Secondary | ICD-10-CM | POA: Diagnosis not present

## 2019-07-18 DIAGNOSIS — W19XXXA Unspecified fall, initial encounter: Secondary | ICD-10-CM | POA: Diagnosis not present

## 2019-07-18 DIAGNOSIS — S8992XA Unspecified injury of left lower leg, initial encounter: Secondary | ICD-10-CM | POA: Diagnosis not present

## 2019-07-18 MED ORDER — TETANUS-DIPHTH-ACELL PERTUSSIS 5-2.5-18.5 LF-MCG/0.5 IM SUSP
0.5000 mL | Freq: Once | INTRAMUSCULAR | Status: AC
  Administered 2019-07-18: 0.5 mL via INTRAMUSCULAR
  Filled 2019-07-18: qty 0.5

## 2019-07-18 NOTE — Discharge Instructions (Addendum)
Elevate and apply ice packs on and off to your knee.  Wear the knee brace as needed for support when weightbearing.  Do not wear continuously or at bedtime.  Return to the ER for any signs of infection such as increasing pain, fever, chills, or redness of the knee.  You may also call Dr. Ruthe Mannan office to arrange a follow-up appointment

## 2019-07-18 NOTE — ED Provider Notes (Signed)
[facility] EMERGENCY DEPARTMENT Provider Note   CSN: [id number] Arrival date & time: 07/18/19  1329     History   Chief Complaint Chief Complaint  Patient presents with  . Knee Pain    HPI Derek Drake is a 73 y.o. male.     HPI   Derek Drake is a 73 y.o. male with a past medical history of diabetes, paroxysmal atrial fibrillation, and hypertension who presents to the Emergency Department complaining of swelling of his left knee for 1 week.  He reports a mechanical fall that occurred 1 week ago in which he fell landing on his left knee.  He states that his knee has been swollen, but is gradually improving.  He endorses mild pain with palpation over his kneecap.  He denies difficulty standing or walking.  He has been applying ice packs and taking ibuprofen with minimal relief.  He denies fever, chills, redness of his knee or feeling instability of his knee with weightbearing.  He denies head injury, LOC, neck or back pain, hip pain or ankle pain.  States that he is here due to concerns from his family members.   Past Medical History:  Diagnosis Date  . Essential hypertension, benign   . GERD (gastroesophageal reflux disease)   . Hypercholesteremia   . PAF (paroxysmal atrial fibrillation) ([place]) 05/04/2014  . Type 2 diabetes mellitus [facility])     Patient Active Problem List   Diagnosis Date Noted  . PAF (paroxysmal atrial fibrillation) ([place]) 05/04/2014  . Thrombocytopenia ([place]) 05/04/2014  . DM type 2 (diabetes mellitus, type 2) ([place]) 05/04/2014  . Essential hypertension, benign 05/04/2014    Past Surgical History:  Procedure Laterality Date  . BIOPSY  02/05/2017   Procedure: BIOPSY;  Surgeon: Daneil Dolin, MD;  Location: [facility] ENDO [facility];  Service: Endoscopy;;  gastric colon  . CHOLECYSTECTOMY    . COLONOSCOPY N/A 05/13/2013   Procedure: COLONOSCOPY;  Surgeon: Daneil Dolin, MD;  Location: [facility] ENDO SU[facility];  Service: Endoscopy;  Laterality: N/A;  8:30 AM  . COLONOSCOPY  N/A 02/05/2017   Procedure: COLONOSCOPY;  Surgeon: Daneil Dolin, MD;  Location: [facility] END[facility];  Service: Endoscopy;  Laterality: N/A;  . ESOPHAGOGASTRODUODENOSCOPY N/A 02/05/2017   Procedure: ESOPHAGOGASTRODUODENOSCOPY (EGD);  Surgeon: Daneil Dolin, MD;  Location: [facility] END[facility];  Service: Endoscopy;  Laterality: N/A;  730 - moved to 12:00   . POLYPECTOMY  02/05/2017   Procedure: POLYPECTOMY;  Surgeon: Daneil Dolin, MD;  Location: [facility];  Service: Endoscopy;;  colon        Home Medications    Prior to Admission medications   Medication Sig Start Date End Date Taking? Authorizing Provider  apixaban (ELIQUIS) 5 MG TABS tablet Take 1 tablet (5 mg total) by mouth 2 (two) times daily. 05/05/14   [name], MD  CINNAMON PO Take 1,000 mg by mouth daily.     [provider]  diltiazem (CARDIZEM CD) 240 MG 24 hr capsule Take 240 mg by mouth daily.    [provider]  Ferrous Sulfate 27 MG TABS Take 27 mg by mouth daily.    [provider]  glipiZIDE (GLUCOTROL) 5 MG tablet Take 5 mg by mouth daily as needed (high blood sugar).  02/09/15   [provider]  [name] 350 MG CAPS Take 350 mg by mouth daily.    [provider]  losartan (COZAAR) 50 MG tablet Take 50 mg by mouth daily.  [provider]  metFORMIN (GLUCOPHAGE) 500 MG tablet Take 1,000 mg by mouth 2 (two) times daily with a meal.    [provider]  Multiple Vitamin (MULTIVITAMIN WITH MINERALS) TABS tablet Take 1 tablet by mouth daily.    [provider]  omeprazole (PRILOSEC) 20 MG capsule Take 20 mg by mouth daily.    [provider]  Phenylephrine-APAP-Guaifenesin (TYLENOL SINUS SEVERE PO) Take 2 tablets by mouth daily as needed (sinuses).    [provider]  simvastatin (ZOCOR) 40 MG tablet Take 40 mg by mouth every morning.     [provider]    Family History Family History  Problem Relation Age of Onset  .  Prostate cancer Brother   . Congestive Heart Failure Mother   . Heart failure Brother   . Colon cancer Neg Hx     Social History Social History   Tobacco Use  . Smoking status: Former Smoker    Packs/day: 2.00    Years: 10.00    Pack years: 20.00    Types: Cigarettes    Start date: 05/25/1956    Quit date: 05/25/2009    Years since quitting: 10.1  . Smokeless tobacco: Current User    Types: Chew  Substance Use Topics  . Alcohol use: Yes    Alcohol/week: 0.0 standard drinks    Comment: Rare - 1 beer every 3 months  . Drug use: No     Allergies   Rabbit protein, Hydrocodone-acetaminophen, Other, and Ivp dye [iodinated diagnostic agents]   Review of Systems Review of Systems  Constitutional: Negative for chills and fever.  Respiratory: Negative for wheezing.   Cardiovascular: Negative for chest pain.  Genitourinary: Negative for difficulty urinating and dysuria.  Musculoskeletal: Positive for arthralgias (Left knee pain and swelling). Negative for back pain and neck pain.  Skin: Negative for color change.       Abrasion of the left knee  Neurological: Negative for dizziness, syncope, numbness and headaches.     Physical Exam Updated Vital Signs BP (!) 146/77 (BP Location: Right Arm)   Pulse 83   Temp 97.9 F (36.6 C) (Oral)   Resp 17   Ht 5\' 11"  (1.803 m)   Wt 86.2 kg   SpO2 100%   BMI 26.50 kg/m   Physical Exam Vitals signs and nursing note reviewed.  Constitutional:      General: He is not in acute distress.    Appearance: Normal appearance.  HENT:     Head: Atraumatic.  Cardiovascular:     Rate and Rhythm: Normal rate and regular rhythm.     Pulses: Normal pulses.  Pulmonary:     Effort: Pulmonary effort is normal.     Breath sounds: Normal breath sounds.  Musculoskeletal: Normal range of motion.        General: Signs of injury present.     Left knee: He exhibits normal range of motion, no swelling, no effusion, no deformity and no bony  tenderness. No medial joint line, no lateral joint line and no patellar tendon tenderness noted.     Comments: Mild tenderness palpation of the prepatellar soft tissues.  No bony tenderness, patient has full range of motion of the knee joint.  No tenderness with valgus or varus stress.  Negative drawer sign.  2 cm abrasion over the patella.  There is no surrounding erythema.  Appears to be healing well.  Skin:    General: Skin is warm.     Capillary  Refill: Capillary refill takes less than 2 seconds.     Findings: No erythema.  Neurological:     General: No focal deficit present.     Mental Status: He is alert.     Sensory: No sensory deficit.     Motor: No weakness.      ED Treatments / Results  Labs (all labs ordered are listed, but only abnormal results are displayed) Labs Reviewed - No data to display  EKG None  Radiology Dg Knee Complete 4 Views Left  Result Date: 07/18/2019 CLINICAL DATA:  Fall. EXAM: LEFT KNEE - COMPLETE 4+ VIEW COMPARISON:  None. FINDINGS: The left knee is located. Prevertebral soft tissue swelling is present. There is no joint effusion. No acute osseous abnormality is present. Atherosclerotic changes are present. IMPRESSION: 1. Prevertebral soft tissue swelling. 2. No acute osseous abnormality origin joint effusion. 3. Atherosclerotic changes are present. Electronically Signed   By: San Morelle M.D.   On: 07/18/2019 15:02    Procedures Procedures (including critical care time)  Medications Ordered in ED Medications  Tdap (BOOSTRIX) injection 0.5 mL (has no administration in time range)     Initial Impression / Assessment and Plan / ED Course  I have reviewed the triage vital signs and the nursing notes.  Pertinent labs & imaging results that were available during my care of the patient were reviewed by me and considered in my medical decision making (see chart for details).        Patient with 90 week old injury of the left knee.  No  concerning symptoms for septic joint.  X-ray reassuring, negative for fracture and effusion.  Edema of the knee likely prepatellar.  Patient agrees to treatment plan of elevation, ice, and he will be given a knee sleeve.  He appears appropriate for discharge home, and agrees to orthopedic follow-up if needed.  return precautions discussed.  Final Clinical Impressions(s) / ED Diagnoses   Final diagnoses:  Acute pain of left knee    ED Discharge Orders    None       Kem Parkinson, PA-C 07/18/19 1619    Noemi Chapel, MD 07/19/19 1550

## 2019-07-18 NOTE — ED Triage Notes (Signed)
Pt fell last last Saturday on left knee.  Denies any pain with ambulation.  Pt states left knee has a "knot"

## 2019-08-08 ENCOUNTER — Other Ambulatory Visit: Payer: Self-pay

## 2019-08-08 ENCOUNTER — Ambulatory Visit: Payer: Medicare Other | Admitting: Orthopedic Surgery

## 2019-08-08 VITALS — BP 133/75 | HR 82 | Temp 97.0°F | Ht 71.0 in | Wt 195.0 lb

## 2019-08-08 DIAGNOSIS — M25562 Pain in left knee: Secondary | ICD-10-CM | POA: Diagnosis not present

## 2019-08-08 NOTE — Patient Instructions (Signed)
Apply heating pad 30 minutes every night for 2 to 3 weeks it will resolve on its own

## 2019-08-08 NOTE — Progress Notes (Signed)
Derek Drake [facility]  08/08/2019  Body mass index is 27.2 kg/m.   HISTORY SECTION :  Chief Complaint  Patient presents with  . Knee Pain    Left knee pain.   HPI The patient presents for evaluation of  (mild/moderate/severe/ ) minimal if any pain, in the (right /left) left knee, for , associated with swelling over the left knee.  Prior treatment none  Does not complain of pain  Review of Systems  Constitutional: Negative for chills and fever.  Skin:       Abrasion left knee     has a past medical history of Essential hypertension, benign, GERD (gastroesophageal reflux disease), Hypercholesteremia, PAF (paroxysmal atrial fibrillation) ([place]) (05/04/2014), and Type 2 diabetes mellitus ([place]).   Past Surgical History:  Procedure Laterality Date  . BIOPSY  02/05/2017   Procedure: BIOPSY;  Surgeon: Daneil Dolin, MD;  Location[facility];  Service: Endoscopy;;  gastric colon  . CHOLECYSTECTOMY    . COLONOSCOPY N/A 05/13/2013   Procedure: COLONOSCOPY;  Surgeon: Daneil Dolin, MD;  [facility];  Service: Endoscopy;  Laterality: N/A;  8:30 AM  . COLONOSCOPY N/A 02/05/2017   Procedure: COLONOSCOPY;  Surgeon: Daneil Dolin, MD;  [facility];  Service: Endoscopy;  Laterality: N/A;  . ESOPHAGOGASTRODUODENOSCOPY N/A 02/05/2017   Procedure: ESOPHAGOGASTRODUODENOSCOPY (EGD);  Surgeon: Daneil Dolin, MD;  [facility];  Service: Endoscopy;  Laterality: N/A;  730 - moved to 12:00   . POLYPECTOMY  02/05/2017   Procedure: POLYPECTOMY;  Surgeon: Daneil Dolin, MD;  [facility];  Service: Endoscopy;;  colon    Body mass index is 27.2 kg/m.   Allergies  Allergen Reactions  . Rabbit Protein Swelling    Face swelled up  . Hydrocodone-Acetaminophen Rash  . Other Swelling and Rash    Dial soap  . Ivp Dye [Iodinated Diagnostic Agents] Other (See Comments)    Possible allergy     Current Outpatient Medications:  .  apixaban (ELIQUIS) 5  MG TABS tablet, Take 1 tablet (5 mg total) by mouth 2 (two) times daily., Disp: 60 tablet, Rfl: 0 .  CINNAMON PO, Take 1,000 mg by mouth daily. , Disp: , Rfl:  .  diltiazem (CARDIZEM CD) 240 MG 24 hr capsule, Take 240 mg by mouth daily., Disp: , Rfl:  .  Ferrous Sulfate 27 MG TABS, Take 27 mg by mouth daily., Disp: , Rfl:  .  glipiZIDE (GLUCOTROL) 5 MG tablet, Take 5 mg by mouth daily as needed (high blood sugar). , Disp: , Rfl:  .  Krill Oil 350 MG CAPS, Take 350 mg by mouth daily., Disp: , Rfl:  .  losartan (COZAAR) 50 MG tablet, Take 50 mg by mouth daily., Disp: , Rfl:  .  metFORMIN (GLUCOPHAGE) 500 MG tablet, Take 1,000 mg by mouth 2 (two) times daily with a meal., Disp: , Rfl:  .  Multiple Vitamin (MULTIVITAMIN WITH MINERALS) TABS tablet, Take 1 tablet by mouth daily., Disp: , Rfl:  .  omeprazole (PRILOSEC) 20 MG capsule, Take 20 mg by mouth daily., Disp: , Rfl:  .  Phenylephrine-APAP-Guaifenesin (TYLENOL SINUS SEVERE PO), Take 2 tablets by mouth daily as needed (sinuses)., Disp: , Rfl:  .  simvastatin (ZOCOR) 40 MG tablet, Take 40 mg by mouth every morning. , Disp: , Rfl:    PHYSICAL EXAM SECTION: 1) BP 133/75   Pulse 82   Temp (!) 97 F (36.1 C)  Ht 5\' 11"  (1.803 m)   Wt 195 lb (88.5 kg)   BMI 27.20 kg/m   Body mass index is 27.2 kg/m. General appearance: Well-developed well-nourished no gross deformities  2) Cardiovascular normal pulse and perfusion in the left lower extremities normal color without edema  3) Neurologically deep tendon reflexes are equal and normal, no sensation loss or deficits no pathologic reflexes  4) Psychological: Awake alert and oriented x3 mood and affect normal  5) Skin no lacerations or ulcerations no nodularity no palpable masses, no erythema or nodularity  6) Musculoskeletal: Left knee has an abrasion then there is a subcutaneous hematoma approximately 3-1/2 x 2-1/2 cm thick rubbery no fluid can be expressed or palpated  Full range of motion  of the knee nontender to palpation   MEDICAL DECISION SECTION:  No diagnosis found.  Imaging Soft tissue swelling over the anterior knee with no significant degenerative changes  Plan:  (Rx., Inj., surg., Frx, MRI/CT, XR:2)  He can use a heating pad and then this should resolve in 2 to 3 weeks no other treatment needed  4:29 PM Arther Abbott, MD  08/08/2019

## 2019-08-25 DIAGNOSIS — I10 Essential (primary) hypertension: Secondary | ICD-10-CM | POA: Diagnosis not present

## 2019-08-25 DIAGNOSIS — I4891 Unspecified atrial fibrillation: Secondary | ICD-10-CM | POA: Diagnosis not present

## 2019-08-25 DIAGNOSIS — D696 Thrombocytopenia, unspecified: Secondary | ICD-10-CM | POA: Diagnosis not present

## 2019-08-25 DIAGNOSIS — E119 Type 2 diabetes mellitus without complications: Secondary | ICD-10-CM | POA: Diagnosis not present

## 2019-08-25 DIAGNOSIS — H9313 Tinnitus, bilateral: Secondary | ICD-10-CM | POA: Diagnosis not present

## 2019-08-25 DIAGNOSIS — K219 Gastro-esophageal reflux disease without esophagitis: Secondary | ICD-10-CM | POA: Diagnosis not present

## 2019-09-05 ENCOUNTER — Telehealth: Payer: Self-pay | Admitting: Cardiology

## 2019-09-05 NOTE — Telephone Encounter (Signed)
Virtual Visit Pre-Appointment Phone Call  "(Name), I am calling you today to discuss your upcoming appointment. We are currently trying to limit exposure to the virus that causes COVID-19 by seeing patients at home rather than in the office."  1. "What is the BEST phone number to call the day of the visit?" - [phone] 2.  3. "Do you have or have access to (through a family member/friend) a smartphone with video capability that we can use for your visit?" a. If yes - list this number in appt notes as "cell" (if different from BEST phone #) and list the appointment type as a VIDEO visit in appointment notes b. If no - list the appointment type as a PHONE visit in appointment notes  4. Confirm consent - "In the setting of the current Covid19 crisis, you are scheduled for a (phone or video) visit with your provider on (date) at (time).  Just as we do with many in-office visits, in order for you to participate in this visit, we must obtain consent.  If you'd like, I can send this to your mychart (if signed up) or email for you to review.  Otherwise, I can obtain your verbal consent now.  All virtual visits are billed to your insurance company just like a normal visit would be.  By agreeing to a virtual visit, we'd like you to understand that the technology does not allow for your provider to perform an examination, and thus may limit your provider's ability to fully assess your condition. If your provider identifies any concerns that need to be evaluated in person, we will make arrangements to do so.  Finally, though the technology is pretty good, we cannot assure that it will always work on either your or our end, and in the setting of a video visit, we may have to convert it to a phone-only visit.  In either situation, we cannot ensure that we have a secure connection.  Are you willing to proceed?" STAFF: Did the patient verbally acknowledge consent to telehealth visit? Document YES/NO here:  YES 5.   6. Advise patient to be prepared - "Two hours prior to your appointment, go ahead and check your blood pressure, pulse, oxygen saturation, and your weight (if you have the equipment to check those) and write them all down. When your visit starts, your provider will ask you for this information. If you have an Apple Watch or Kardia device, please plan to have heart rate information ready on the day of your appointment. Please have a pen and paper handy nearby the day of the visit as well."  7. Give patient instructions for MyChart download to smartphone OR [email or web address] as below if video visit (depending on what platform provider is using)  8. Inform patient they will receive a phone call 15 minutes prior to their appointment time (may be from unknown caller ID) so they should be prepared to answer    TELEPHONE CALL NOTE  Derek Drake has been deemed a candidate for a follow-up tele-health visit to limit community exposure during the Covid-19 pandemic. I spoke with the patient via phone to ensure availability of phone/video source, confirm preferred email & phone number, and discuss instructions and expectations.  I reminded Derek Drake to be prepared with any vital sign and/or heart rhythm information that could potentially be obtained via home monitoring, at the time of his visit. I reminded Derek Drake to expect a phone call prior to his  visit.  Chanda Busing 09/05/2019 4:38 PM   INSTRUCTIONS FOR DOWNLOADING THE MYCHART APP TO SMARTPHONE  - The patient must first make sure to have activated MyChart and know their login information - If Apple, go to [employer] and type in MyChart in the search bar and download the app. If Android, ask patient to go to [employer] and type in [place] in the search bar and download the app. The app is free but as with any other app downloads, their phone may require them to verify saved payment information or Apple/Android password.   - The patient will need to then log into the app with their MyChart username and password, and select  as their healthcare provider to link the account. When it is time for your visit, go to the MyChart app, find appointments, and click Begin Video Visit. Be sure to Select Allow for your device to access the Microphone and Camera for your visit. You will then be connected, and your provider will be with you shortly.  **If they have any issues connecting, or need assistance please contact MyChart service desk [phone])**  **If using a computer, in order to ensure the best quality for their visit they will need to use either of the following Internet Browsers: [employer], or Google Chrome**  IF USING DOXIMITY or DOXY.ME - The patient will receive a link just prior to their visit by text.     FULL LENGTH CONSENT FOR TELE-HEALTH VISIT   I hereby voluntarily request, consent and authorize [facility] and its employed or contracted physicians, physician assistants, nurse practitioners or other licensed health care professionals (the Practitioner), to provide me with telemedicine health care services (the "Services") as deemed necessary by the treating Practitioner. I acknowledge and consent to receive the Services by the Practitioner via telemedicine. I understand that the telemedicine visit will involve communicating with the Practitioner through live audiovisual communication technology and the disclosure of certain medical information by electronic transmission. I acknowledge that I have been given the opportunity to request an in-person assessment or other available alternative prior to the telemedicine visit and am voluntarily participating in the telemedicine visit.  I understand that I have the right to withhold or withdraw my consent to the use of telemedicine in the course of my care at any time, without affecting my right to future care or treatment, and that  the Practitioner or I may terminate the telemedicine visit at any time. I understand that I have the right to inspect all information obtained and/or recorded in the course of the telemedicine visit and may receive copies of available information for a reasonable fee.  I understand that some of the potential risks of receiving the Services via telemedicine include:  Marland Kitchen Delay or interruption in medical evaluation due to technological equipment failure or disruption; . Information transmitted may not be sufficient (e.g. poor resolution of images) to allow for appropriate medical decision making by the Practitioner; and/or  . In rare instances, security protocols could fail, causing a breach of personal health information.  Furthermore, I acknowledge that it is my responsibility to provide information about my medical history, conditions and care that is complete and accurate to the best of my ability. I acknowledge that Practitioner's advice, recommendations, and/or decision may be based on factors not within their control, such as incomplete or inaccurate data provided by me or distortions of diagnostic images or specimens that may result from electronic transmissions. I understand  that the practice of medicine is not an exact science and that Practitioner makes no warranties or guarantees regarding treatment outcomes. I acknowledge that I will receive a copy of this consent concurrently upon execution via email to the email address I last provided but may also request a printed copy by calling the office of [facility].    I understand that my insurance will be billed for this visit.   I have read or had this consent read to me. . I understand the contents of this consent, which adequately explains the benefits and risks of the Services being provided via telemedicine.  . I have been provided ample opportunity to ask questions regarding this consent and the Services and have had my questions answered to  my satisfaction. . I give my informed consent for the services to be provided through the use of telemedicine in my medical care  By participating in this telemedicine visit I agree to the above.

## 2019-09-12 DIAGNOSIS — E119 Type 2 diabetes mellitus without complications: Secondary | ICD-10-CM | POA: Diagnosis not present

## 2019-09-14 ENCOUNTER — Encounter: Payer: Self-pay | Admitting: Cardiology

## 2019-09-14 ENCOUNTER — Telehealth (INDEPENDENT_AMBULATORY_CARE_PROVIDER_SITE_OTHER): Payer: Medicare Other | Admitting: Cardiology

## 2019-09-14 VITALS — BP 156/85 | HR 82 | Ht 71.0 in | Wt 195.0 lb

## 2019-09-14 DIAGNOSIS — Z7901 Long term (current) use of anticoagulants: Secondary | ICD-10-CM

## 2019-09-14 DIAGNOSIS — I10 Essential (primary) hypertension: Secondary | ICD-10-CM | POA: Diagnosis not present

## 2019-09-14 DIAGNOSIS — I480 Paroxysmal atrial fibrillation: Secondary | ICD-10-CM

## 2019-09-14 NOTE — Patient Instructions (Signed)
Medication Instructions:  Continue all current medications.  Labwork:  CBC, BMET - orders enclosed.   You may do these labs at [facility] - main entrance.  Office will contact with results via phone or letter.    Testing/Procedures: none  Follow-Up: Your physician wants you to follow up in: 6 months.  You will receive a reminder letter in the mail one-two months in advance.  If you don't receive a letter, please call our office to schedule the follow up appointment   Any Other Special Instructions Will Be Listed Below (If Applicable).  If you need a refill on your cardiac medications before your next appointment, please call your pharmacy.

## 2019-09-14 NOTE — Progress Notes (Signed)
Virtual Visit via Telephone Note   This visit type was conducted due to national recommendations for restrictions regarding the COVID-19 Pandemic (e.g. social distancing) in an effort to limit this patient's exposure and mitigate transmission in our community.  Due to his co-morbid illnesses, this patient is at least at moderate risk for complications without adequate follow up.  This format is felt to be most appropriate for this patient at this time.  The patient did not have access to video technology/had technical difficulties with video requiring transitioning to audio format only (telephone).  All issues noted in this document were discussed and addressed.  No physical exam could be performed with this format.  Please refer to the patient's chart for his  consent to telehealth for [facility].  Date:  09/14/2019   ID:  Derek Drake, Derek Drake March 28, 1946, MRN [id number]  Patient Location: Home Provider Location: Office  PCP:  [name], MD  Cardiologist:  [name], MD Electrophysiologist:  None   Evaluation Performed:  Follow-Up Visit  Chief Complaint:  Cardiac follow-up  History of Present Illness:    Derek Drake is a 73 y.o. male seen in October 2019.  We spoke by phone today.  He tells me that he has been doing reasonably well.  He reports an occasional sense of rapid heartbeat, usually when he is angry or upset, but sometimes spontaneously.  These episodes are not prolonged and he has no associated chest pain.  I reviewed his medications which are outlined below.  He reports no spontaneous bleeding or stool changes on Eliquis.  He has not had recent lab work which we discussed getting arranged.  He continues to follow-up with Dr. Gerarda Fraction.  States that he has had trouble with tinnitus, referred to see an ENT specialist and is now on gabapentin.  The patient does not have symptoms concerning for COVID-19 infection (fever, chills, cough, or new shortness of breath).     Past Medical History:  Diagnosis Date  . Essential hypertension   . GERD (gastroesophageal reflux disease)   . Hypercholesteremia   . PAF (paroxysmal atrial fibrillation) ([place]) 05/04/2014  . Type 2 diabetes mellitus ([place])    Past Surgical History:  Procedure Laterality Date  . BIOPSY  02/05/2017   Procedure: BIOPSY;  Surgeon: Daneil Dolin, MD;  [facility];  Service: Endoscopy;;  gastric colon  . CHOLECYSTECTOMY    . COLONOSCOPY N/A 05/13/2013   Procedure: COLONOSCOPY;  Surgeon: Daneil Dolin, MD;  [facility];  Service: Endoscopy;  Laterality: N/A;  8:30 AM  . COLONOSCOPY N/A 02/05/2017   Procedure: COLONOSCOPY;  Surgeon: Daneil Dolin, MD;  [facility];  Service: Endoscopy;  Laterality: N/A;  . ESOPHAGOGASTRODUODENOSCOPY N/A 02/05/2017   Procedure: ESOPHAGOGASTRODUODENOSCOPY (EGD);  Surgeon: Daneil Dolin, MD;  [facility];  Service: Endoscopy;  Laterality: N/A;  730 - moved to 12:00   . POLYPECTOMY  02/05/2017   Procedure: POLYPECTOMY;  Surgeon: Daneil Dolin, MD;  [facility];  Service: Endoscopy;;  colon     Current Meds  Medication Sig  . apixaban (ELIQUIS) 5 MG TABS tablet Take 1 tablet (5 mg total) by mouth 2 (two) times daily.  Marland Kitchen CINNAMON PO Take 1,000 mg by mouth daily.   Marland Kitchen diltiazem (CARDIZEM CD) 240 MG 24 hr capsule Take 240 mg by mouth daily.  . Ferrous Sulfate 27 MG TABS Take 27 mg by mouth daily.  Marland Kitchen gabapentin (  NEURONTIN) 100 MG capsule Take 100 mg by mouth at bedtime.  Marland Kitchen glipiZIDE (GLUCOTROL) 5 MG tablet Take 5 mg by mouth daily as needed (high blood sugar).   . hydrochlorothiazide (HYDRODIURIL) 12.5 MG tablet Take 12.5 mg by mouth daily.  [name] 350 MG CAPS Take 350 mg by mouth daily.  Marland Kitchen losartan (COZAAR) 50 MG tablet Take 50 mg by mouth daily.  . metFORMIN (GLUCOPHAGE) 500 MG tablet Take 1,000 mg by mouth 2 (two) times daily with a meal.  . Multiple Vitamin (MULTIVITAMIN WITH MINERALS)  TABS tablet Take 1 tablet by mouth daily.  Marland Kitchen omeprazole (PRILOSEC) 20 MG capsule Take 20 mg by mouth daily.  . simvastatin (ZOCOR) 40 MG tablet Take 40 mg by mouth every evening.      Allergies:   Rabbit protein, Hydrocodone-acetaminophen, Other, and Ivp dye [iodinated diagnostic agents]   Social History   Tobacco Use  . Smoking status: Former Smoker    Packs/day: 2.00    Years: 10.00    Pack years: 20.00    Types: Cigarettes    Start date: 05/25/1956    Quit date: 05/25/2009    Years since quitting: 10.3  . Smokeless tobacco: Current User    Types: Chew  Substance Use Topics  . Alcohol use: Yes    Alcohol/week: 0.0 standard drinks    Comment: Rare - 1 beer every 3 months  . Drug use: No     Family Hx: The patient's family history includes Congestive Heart Failure in his mother; Heart failure in his brother; Prostate cancer in his brother. There is no history of Colon cancer.  ROS:   Please see the history of present illness. All other systems reviewed and are negative.   Prior CV studies:   The following studies were reviewed today:  Echocardiogram 05/05/2014: Study Conclusions   - Left ventricle: The cavity size was normal. Wall thickness was  increased in a pattern of mild LVH. Systolic function was normal.  The estimated ejection fraction was in the range of 60% to 65%.  Wall motion was normal; there were no regional wall motion  abnormalities. Doppler parameters are consistent with abnormal  left ventricular relaxation (grade 1 diastolic dysfunction).  Doppler parameters are consistent with high ventricular filling  pressure.  - Aortic valve: Mildly calcified annulus. Trileaflet. There was no  significant regurgitation.  - Mitral valve: Calcified annulus. There was mild regurgitation.  - Left atrium: The atrium was at the upper limits of normal in  size.  - Right atrium: Central venous pressure (est): 3 mm Hg.  - Tricuspid valve: There was  mild regurgitation.  - Pulmonary arteries: PA peak pressure: 31 mm Hg (S).  - Pericardium, extracardiac: There was no pericardial effusion.   Impressions:   - Mild LVH with LVEF 123456, grade 1 diastolic dysfunction with  increased filling pressures. Upper normal left atrial chamber  size. Mild mitral regurgitation. Mild tricuspid regurgitation  with upper normal PASP 31 mm mercury.   Labs/Other Tests and Data Reviewed:    EKG: I personally reviewed his ECG from 06/25/2018 which showed normal sinus rhythm.  Recent Labs:  No interval lab work for review today.  Wt Readings from Last 3 Encounters:  09/14/19 195 lb (88.5 kg)  08/08/19 195 lb (88.5 kg)  07/18/19 190 lb (86.2 kg)     Objective:    Vital Signs:  BP (!) 156/85   Pulse 82   Ht 5\' 11"  (1.803 m)   Wt  195 lb (88.5 kg)   BMI 27.20 kg/m    Patient spoke in full sentences, not short of breath. No audible wheezing or coughing. Speech pattern normal.  ASSESSMENT & PLAN:    1.  Paroxysmal atrial fibrillation with CHA2DS2-VASc score of 3.  He reports no progressive palpitations and would continue on Eliquis for stroke prophylaxis.  No change in current dose of Cardizem CD.  Follow-up CBC and BMET.  ECG for next visit.  2.  Essential hypertension, systolic in the [id number] today.  We will continue to follow with Dr. Gerarda Fraction for further medication titration.  He is also on Cozaar and HCTZ.  COVID-19 Education: The signs and symptoms of COVID-19 were discussed with the patient and how to seek care for testing (follow up with PCP or arrange E-visit).  The importance of social distancing was discussed today.  Time:   Today, I have spent 6 minutes with the patient with telehealth technology discussing the above problems.     Medication Adjustments/Labs and Tests Ordered: Current medicines are reviewed at length with the patient today.  Concerns regarding medicines are outlined above.   Tests Ordered: Orders Placed This  Encounter  Procedures  . Basic metabolic panel  . CBC    Medication Changes: No orders of the defined types were placed in this encounter.   Follow Up:  In Person 6 months in the [place] office.  Signed, [name], MD  09/14/2019 9:59 AM    [place]

## 2019-09-23 LAB — CBC
HCT: 34.3 % — ABNORMAL LOW (ref 38.5–50.0)
Hemoglobin: 11.8 g/dL — ABNORMAL LOW (ref 13.2–17.1)
MCH: 30 pg (ref 27.0–33.0)
MCHC: 34.4 g/dL (ref 32.0–36.0)
MCV: 87.3 fL (ref 80.0–100.0)
MPV: 13.7 fL — ABNORMAL HIGH (ref 7.5–12.5)
Platelets: 101 10*3/uL — ABNORMAL LOW (ref 140–400)
RBC: 3.93 10*6/uL — ABNORMAL LOW (ref 4.20–5.80)
RDW: 13.3 % (ref 11.0–15.0)
WBC: 5.3 10*3/uL (ref 3.8–10.8)

## 2019-09-23 LAB — BASIC METABOLIC PANEL
BUN: 17 mg/dL (ref 7–25)
CO2: 25 mmol/L (ref 20–32)
Calcium: 9.7 mg/dL (ref 8.6–10.3)
Chloride: 101 mmol/L (ref 98–110)
Creat: 1.17 mg/dL (ref 0.70–1.18)
Glucose, Bld: 164 mg/dL — ABNORMAL HIGH (ref 65–139)
Potassium: 4.3 mmol/L (ref 3.5–5.3)
Sodium: 137 mmol/L (ref 135–146)

## 2020-01-05 IMAGING — DX DG KNEE COMPLETE 4+V*L*
4 series · 4 of 4 positions shown · non-contrast
Comparison: None.

CLINICAL DATA: Fall.

EXAM:
LEFT KNEE - COMPLETE 4+ VIEW

[knee ap]
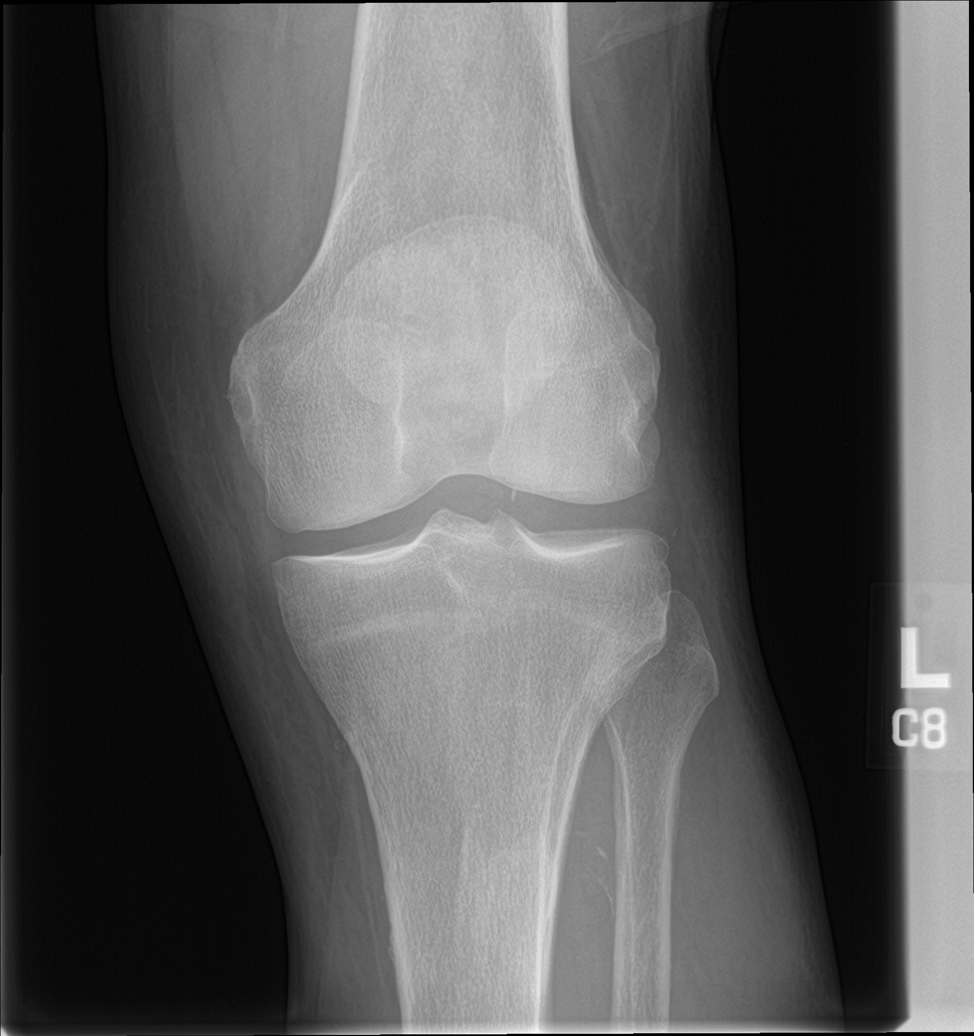

[knee obl (1 of 2)]
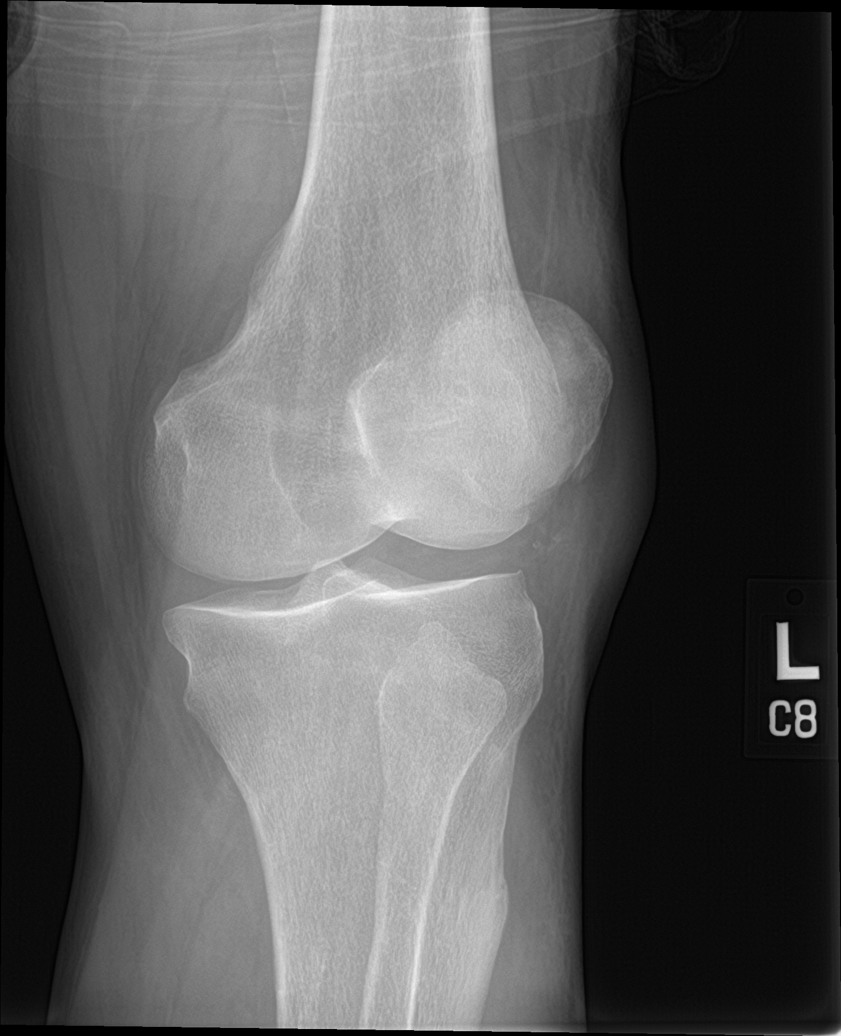

[knee obl (2 of 2)]
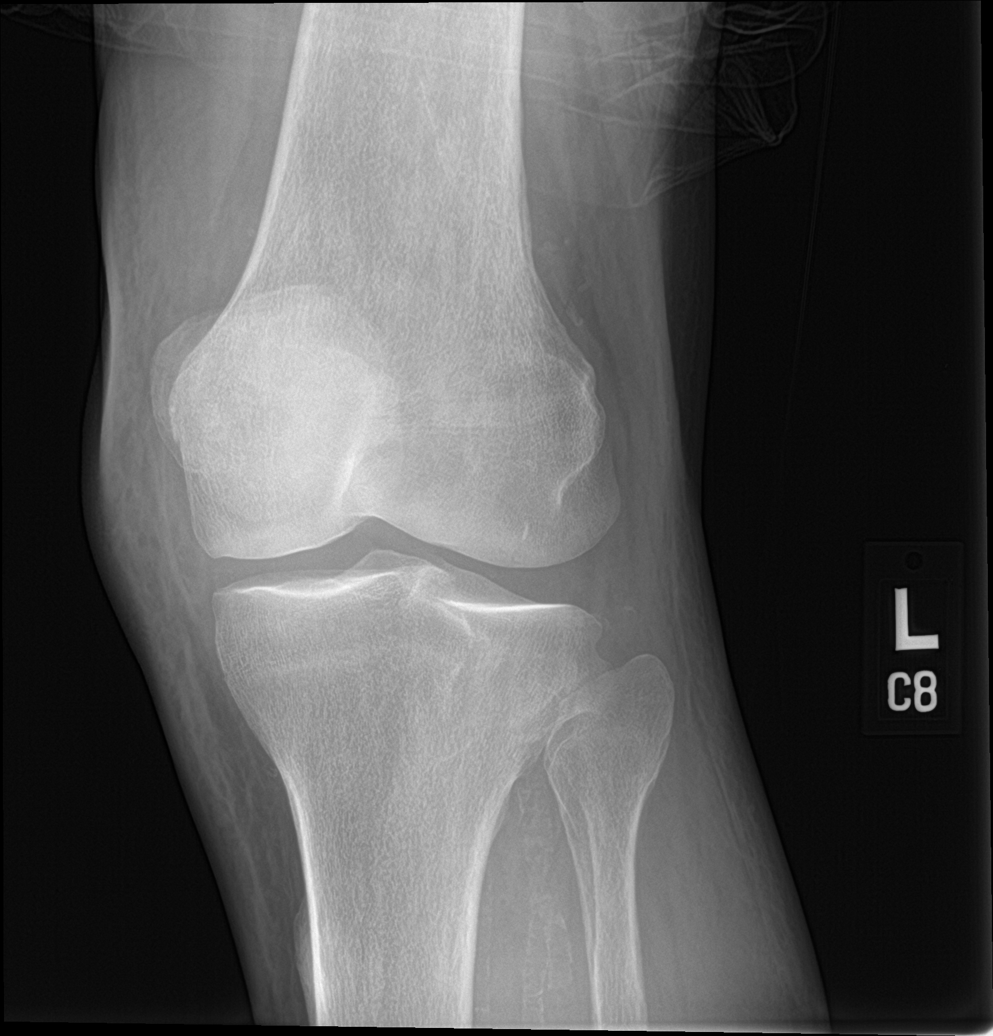

[knee lat]
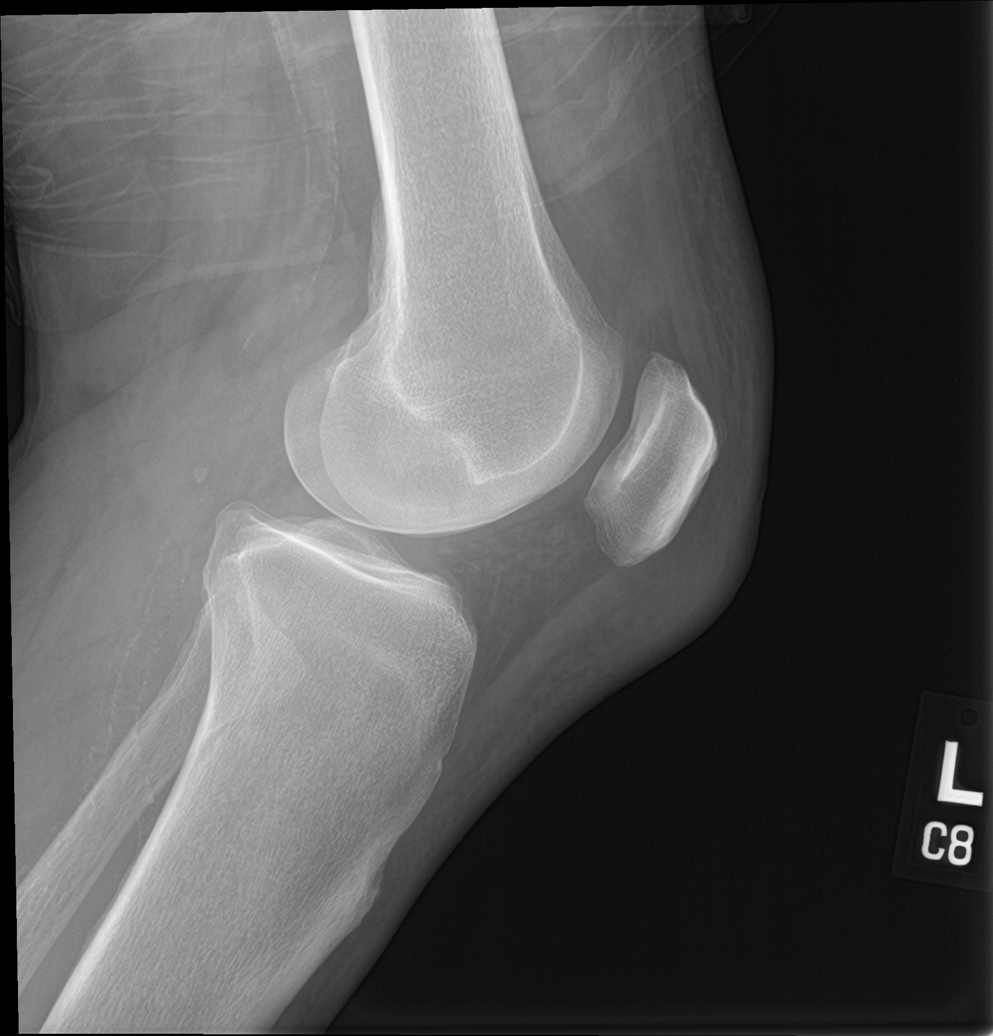

[4 of 4 positions shown; findings below may reference images not displayed]

FINDINGS: The left knee is located. Prevertebral soft tissue swelling is
present. There is no joint effusion. No acute osseous abnormality is
present. Atherosclerotic changes are present.
IMPRESSION: 1. Prevertebral soft tissue swelling.
2. No acute osseous abnormality origin joint effusion.
3. Atherosclerotic changes are present.

## 2020-01-31 ENCOUNTER — Encounter: Payer: Self-pay | Admitting: Internal Medicine

## 2020-02-14 ENCOUNTER — Telehealth: Payer: Self-pay | Admitting: Cardiology

## 2020-02-14 NOTE — Telephone Encounter (Signed)
Thank you :)

## 2020-02-14 NOTE — Telephone Encounter (Signed)
Per voicemail from pt's wife Derek Drake-- pt did a Cologuard test and it came back w/ blood in his stool-- stated he has an apt in July to see GI, but he is on Eliquis and would like to know if he should hold that medication  Please call [phone]

## 2020-02-14 NOTE — Telephone Encounter (Signed)
Wife denies any frank GI bleeding.She says Dr.Fusco has told them Mr.Eslick is anemic. Would you like me to request labs?

## 2020-02-14 NOTE — Telephone Encounter (Signed)
I am glad that he has not seen any blood. Please get the lab work for review.

## 2020-02-14 NOTE — Telephone Encounter (Signed)
I am not sure that he necessarily needs to stop Eliquis now unless he is seeing frank blood in his stools.  The Cologuard test finds evidence of GI blood loss that is not necessarily visible and could be minimal.  Has he had a recent CBC with PCP?

## 2020-02-14 NOTE — Telephone Encounter (Signed)
Most current lab results from Dr.Fusco are scanned into Media

## 2020-02-14 NOTE — Telephone Encounter (Signed)
I will forward to Dr.McDowell °

## 2020-03-01 NOTE — Progress Notes (Signed)
Cardiology Office Note  Date: 03/02/2020   ID: [name], [name] Oct 01, 1945, MRN [id number]  PCP:  [name], MD  Cardiologist:  [name], MD Electrophysiologist:  None   Chief Complaint: Follow-up PAF  History of Present Illness: Derek Drake is a 74 y.o. male with a history of PAF with CHA2DS2-VASc score of 3, HTN  Last saw Dr. Domenic Polite via telemedicine visit 09/14/2019.  He reported no palpitations and was continuing Eliquis for stroke prophylaxis.  Reported no bleeding.  No changes in current dose of Cardizem CD.  A follow-up CBC and BMP was ordered.  EKG would be needed for next visit.  Blood pressure systolic was in the 366Y during that visit.  He was to continue with Dr. Gerarda Fraction for further medication titration.  He was to continue his Cozaar and HCTZ.  Patient states from a cardiac standpoint he has been doing very well.  He denies any progressive anginal or exertional symptoms, palpitations or arrhythmias, orthostatic symptoms, CVA or TIA-like symptoms, blood in his stool however he has had a positive Cologuard and will be having a colonoscopy in the near future.  Denies any claudication-like symptoms, DVT or PE-like symptoms, or lower extremity edema.  EKG today shows normal sinus rhythm rate of 72.    Past Medical History:  Diagnosis Date  . Essential hypertension   . GERD (gastroesophageal reflux disease)   . Hypercholesteremia   . PAF (paroxysmal atrial fibrillation) ([place]) 05/04/2014  . Type 2 diabetes mellitus ([name])     Past Surgical History:  Procedure Laterality Date  . BIOPSY  02/05/2017   Procedure: BIOPSY;  Surgeon: Daneil Dolin, MD;  [facility];  Service: Endoscopy;;  gastric colon  . CHOLECYSTECTOMY    . COLONOSCOPY N/A 05/13/2013   Procedure: COLONOSCOPY;  Surgeon: Daneil Dolin, MD;  [facility];  Service: Endoscopy;  Laterality: N/A;  8:30 AM  . COLONOSCOPY N/A 02/05/2017   Procedure: COLONOSCOPY;  Surgeon: Daneil Dolin, MD;  [facility];  Service: Endoscopy;  Laterality: N/A;  . ESOPHAGOGASTRODUODENOSCOPY N/A 02/05/2017   Procedure: ESOPHAGOGASTRODUODENOSCOPY (EGD);  Surgeon: Daneil Dolin, MD;  [facility];  Service: Endoscopy;  Laterality: N/A;  730 - moved to 12:00   . POLYPECTOMY  02/05/2017   Procedure: POLYPECTOMY;  Surgeon: Daneil Dolin, MD;  [facility];  Service: Endoscopy;;  colon    Current Outpatient Medications  Medication Sig Dispense Refill  . apixaban (ELIQUIS) 5 MG TABS tablet Take 1 tablet (5 mg total) by mouth 2 (two) times daily. 60 tablet 0  . CINNAMON PO Take 1,000 mg by mouth daily.     Marland Kitchen diltiazem (CARDIZEM CD) 240 MG 24 hr capsule Take 240 mg by mouth daily.    . Ferrous Sulfate 27 MG TABS Take 27 mg by mouth daily.    Marland Kitchen gabapentin (NEURONTIN) 100 MG capsule Take 100 mg by mouth at bedtime.    Marland Kitchen glipiZIDE (GLUCOTROL) 5 MG tablet Take 5 mg by mouth daily as needed (high blood sugar).     . hydrochlorothiazide (HYDRODIURIL) 12.5 MG tablet Take 12.5 mg by mouth daily.    [name] 350 MG CAPS Take 350 mg by mouth daily.    Marland Kitchen losartan (COZAAR) 50 MG tablet Take 50 mg by mouth daily.    . metFORMIN (GLUCOPHAGE) 500 MG tablet Take 1,000 mg by mouth 2 (two) times daily with a meal.    .  Multiple Vitamin (MULTIVITAMIN WITH MINERALS) TABS tablet Take 1 tablet by mouth daily.    Marland Kitchen omeprazole (PRILOSEC) 20 MG capsule Take 20 mg by mouth daily.    . simvastatin (ZOCOR) 40 MG tablet Take 40 mg by mouth every evening.      No current facility-administered medications for this visit.   Allergies:  Rabbit protein, Hydrocodone-acetaminophen, Other, and Ivp dye [iodinated diagnostic agents]   Social History: The patient  reports that he quit smoking about 10 years ago. His smoking use included cigarettes. He started smoking about 63 years ago. He has a 20.00 pack-year smoking history. His smokeless tobacco use includes chew. He reports current  alcohol use. He reports that he does not use drugs.   Family History: The patient's family history includes Congestive Heart Failure in his mother; Heart failure in his brother; Prostate cancer in his brother.   ROS:  Please see the history of present illness. Otherwise, complete review of systems is positive for none.  All other systems are reviewed and negative.   Physical Exam: VS:  BP 130/60   Pulse 72   Ht 5\' 11"  (1.803 m)   Wt 195 lb (88.5 kg)   SpO2 97%   BMI 27.20 kg/m , BMI Body mass index is 27.2 kg/m.  Wt Readings from Last 3 Encounters:  03/02/20 195 lb (88.5 kg)  09/14/19 195 lb (88.5 kg)  08/08/19 195 lb (88.5 kg)    General: Patient appears comfortable at rest. Neck: Supple, no elevated JVP or carotid bruits, no thyromegaly. Lungs: Clear to auscultation, nonlabored breathing at rest. Cardiac: Regular rate and rhythm, no S3 or significant systolic murmur, no pericardial rub. Extremities: No pitting edema, distal pulses 2+. Skin: Warm and dry. Musculoskeletal: No kyphosis. Neuropsychiatric: Alert and oriented x3, affect grossly appropriate.  ECG:  An ECG dated 03/02/2020 was personally reviewed today and demonstrated:  Normal sinus rhythm rate of 72.  Recent Labwork: 09/22/2019: BUN 17; Creat 1.17; Hemoglobin 11.8; Platelets 101; Potassium 4.3; Sodium 137  No results found for: CHOL, TRIG, HDL, CHOLHDL, VLDL, LDLCALC, LDLDIRECT  Other Studies Reviewed Today:  Echocardiogram 05/05/2014: Study Conclusions   - Left ventricle: The cavity size was normal. Wall thickness was  increased in a pattern of mild LVH. Systolic function was normal.  The estimated ejection fraction was in the range of 60% to 65%.  Wall motion was normal; there were no regional wall motion  abnormalities. Doppler parameters are consistent with abnormal  left ventricular relaxation (grade 1 diastolic dysfunction).  Doppler parameters are consistent with high ventricular filling    pressure.  - Aortic valve: Mildly calcified annulus. Trileaflet. There was no  significant regurgitation.  - Mitral valve: Calcified annulus. There was mild regurgitation.  - Left atrium: The atrium was at the upper limits of normal in  size.  - Right atrium: Central venous pressure (est): 3 mm Hg.  - Tricuspid valve: There was mild regurgitation.  - Pulmonary arteries: PA peak pressure: 31 mm Hg (S).  - Pericardium, extracardiac: There was no pericardial effusion.   Impressions:   - Mild LVH with LVEF 60-10%, grade 1 diastolic dysfunction with  increased filling pressures. Upper normal left atrial chamber  size. Mild mitral regurgitation. Mild tricuspid regurgitation  with upper normal PASP 31 mm mercury.   Assessment and Plan:  1. Paroxysmal atrial fibrillation (HCC)   2. Essential hypertension   3. Mixed hyperlipidemia     1. Paroxysmal atrial fibrillation [facility]) Patient denies any recent palpitations  or arrhythmias.  EKG today shows normal sinus rhythm with a rate of 72.  Continue Eliquis 5 mg p.o. twice daily.  Continue Cardizem 240 mg daily.  2. Essential hypertension He is normotensive today with a blood pressure of 130/60.  Continue HCTZ 12.5 mg daily, losartan 50 mg daily.  3.  Hyperlipidemia Recent lipids on 01/03/2020 showed triglyceride 80, HDL 39, total cholesterol 118, LDL 63.  Continue simvastatin 40 mg daily.    Medication Adjustments/Labs and Tests Ordered: Current medicines are reviewed at length with the patient today.  Concerns regarding medicines are outlined above.   Disposition: Follow-up with Dr. Domenic Polite or APP 1 year  Signed, Levell July, NP 03/02/2020 10:30 AM    [facility] [place], [place], [place] [address] Phone: [phone]; Fax: [phone]

## 2020-03-02 ENCOUNTER — Ambulatory Visit: Payer: Medicare Other | Admitting: Family Medicine

## 2020-03-02 ENCOUNTER — Encounter: Payer: Self-pay | Admitting: Family Medicine

## 2020-03-02 ENCOUNTER — Other Ambulatory Visit: Payer: Self-pay

## 2020-03-02 VITALS — BP 130/60 | HR 72 | Ht 71.0 in | Wt 195.0 lb

## 2020-03-02 DIAGNOSIS — I480 Paroxysmal atrial fibrillation: Secondary | ICD-10-CM

## 2020-03-02 DIAGNOSIS — I10 Essential (primary) hypertension: Secondary | ICD-10-CM | POA: Diagnosis not present

## 2020-03-02 DIAGNOSIS — E782 Mixed hyperlipidemia: Secondary | ICD-10-CM

## 2020-03-02 NOTE — Patient Instructions (Signed)
Medication Instructions:  Your physician recommends that you continue on your current medications as directed. Please refer to the Current Medication list given to you today.  *If you need a refill on your cardiac medications before your next appointment, please call your pharmacy*   Lab Work: None If you have labs (blood work) drawn today and your tests are completely normal, you will receive your results only by:  [place] (if you have MyChart) OR  A paper copy in the mail If you have any lab test that is abnormal or we need to change your treatment, we will call you to review the results.   Testing/Procedures: None   Follow-Up: At [facility], you and your health needs are our priority.  As part of our continuing mission to provide you with exceptional heart care, we have created designated Provider Care Teams.  These Care Teams include your primary Cardiologist (physician) and Advanced Practice Providers (APPs -  Physician Assistants and Nurse Practitioners) who all work together to provide you with the care you need, when you need it.  We recommend signing up for the patient portal called "MyChart".  Sign up information is provided on this After Visit Summary.  MyChart is used to connect with patients for Virtual Visits (Telemedicine).  Patients are able to view lab/test results, encounter notes, upcoming appointments, etc.  Non-urgent messages can be sent to your provider as well.   To learn more about what you can do with MyChart, go to [email or web address].    Your next appointment:   12 month(s)  The format for your next appointment:   In Person  Provider:   You may see [name], MD or one of the following Advanced Practice Providers on your designated Care Team:    Bernerd Pho, PA-C   Ermalinda Barrios, PA-C     Other Instructions None

## 2020-03-22 ENCOUNTER — Ambulatory Visit: Payer: Medicare Other | Admitting: Nurse Practitioner

## 2020-03-22 ENCOUNTER — Encounter: Payer: Self-pay | Admitting: Nurse Practitioner

## 2020-03-22 ENCOUNTER — Other Ambulatory Visit: Payer: Self-pay

## 2020-03-22 VITALS — BP 144/80 | HR 77 | Temp 97.1°F | Ht 71.0 in | Wt 194.0 lb

## 2020-03-22 DIAGNOSIS — I8500 Esophageal varices without bleeding: Secondary | ICD-10-CM | POA: Diagnosis not present

## 2020-03-22 DIAGNOSIS — D509 Iron deficiency anemia, unspecified: Secondary | ICD-10-CM | POA: Diagnosis not present

## 2020-03-22 DIAGNOSIS — R195 Other fecal abnormalities: Secondary | ICD-10-CM | POA: Diagnosis not present

## 2020-03-22 DIAGNOSIS — D696 Thrombocytopenia, unspecified: Secondary | ICD-10-CM

## 2020-03-22 DIAGNOSIS — D500 Iron deficiency anemia secondary to blood loss (chronic): Secondary | ICD-10-CM | POA: Insufficient documentation

## 2020-03-22 HISTORY — DX: Other fecal abnormalities: R19.5

## 2020-03-22 NOTE — Assessment & Plan Note (Signed)
Noted history of thrombocytopenia and now with varices on EGD in 2018 likely liver etiology.  Further work-up to include CBC, ferritin, fiber sure.  Also check an abdominal ultrasound with elastography for fibrosis score and to evaluate for splenomegaly and other signs of portal hypertension.  Further recommendations to follow.  I have a high suspicion of at least moderate to significant fibrosis, possible cirrhosis.  He will likely need ongoing evaluation care.

## 2020-03-22 NOTE — Assessment & Plan Note (Signed)
Referred by primary care for heme positive stool in the setting of known IDA, possible undiagnosed cirrhosis.  He just underwent colonoscopy and EGD in 2018 which found no significant findings other than esophageal varices as noted above.  He is on Eliquis for atrial fibrillation.  At this point I feel the most reasonable approach is upper endoscopy to reevaluate varices as well as a capsule study placement to wrap up GI bleed evaluation.  Given a single, benign polyp on colonoscopy just 2-1/2 to 3 years ago it may be a bit of an overreached to repeat colonoscopy.  However, I will check with Dr. Gala Romney to make sure he does not have any other recommendations related to this.  Finalize plan to follow.  Follow-up in 4 months.

## 2020-03-22 NOTE — Assessment & Plan Note (Signed)
Previous EGD with noted esophageal varices without known history of cirrhosis.  Interestingly, the patient is also had thrombocytopenia as per below.  States he was evaluated in [place], [place] for cirrhosis and was told "the bad news is you have to lay off alcohol" but no definitive findings or recommendations were communicated.  He is heme positive, it has been 3 years since EGD and he is not on a nonselective beta-blocker.  At this point I feel it is reasonable to proceed with EGD to recheck his varices while we work-up his liver.  I will send a message to Dr. Gala Romney to finalize plan.

## 2020-03-22 NOTE — Progress Notes (Signed)
Primary Care Physician:  [name], MD Primary Gastroenterologist:  Dr. Gala Romney  Chief Complaint  Patient presents with  . positive stool test    HPI:   Derek Drake is a 74 y.o. male who presents on referral from primary care for blood in the stool.  Reviewed information provided with referral including labs indicating heme positive stool, low hemoglobin at 11.9, normal LFTs, low iron sat at 13%; these were all drawn 01/03/2020.  Of note, iron saturation is lower than it was when we last saw the patient, as per below.. the patient has not been seen by our office since 2018 when he was seen for IDA and GERD.  Labs around that time with hemoglobin of 12.2, MCV 82, ferritin of 18, saturation low level normal at 19.  On Eliquis for atrial fibrillation.  Recommended EGD and colonoscopy.  EGD completed 02/05/2017 which found grade 1 esophageal varices, small hiatal hernia, gastritis status post biopsy.  Surgical pathology found the biopsies to be chronic gastritis negative for H. pylori.  Noted query underlying liver disease with history of thrombocytopenia and endoscopy findings.  Colonoscopy found a single 4 mm polyp in the descending colon, mucosal ulceration status post biopsy, ileal erosions.  Surgical pathology found the biopsies to be focal erosion with associated inflammation and the polyp to be benign colonic mucosa.  Recommended office follow-up in 1 month, no future colonoscopy unless new symptoms develop.  It appears the patient had a follow-up visit in June 2018, but this was canceled.  He was lost to follow-up subsequently.  Today he states he's doing ok overall. States he has not seen any hematochezia, melena. Denies any new weakness, fatigue, or dizziness. Denies abdominal pain, N/V, fever, chills, unintentional weight loss. Was seen in [place], [place] for low platelet count and checked his liver and was told it was ok. Denies URI or flu-like symptoms. Denies loss of sense of taste or  smell. The patient has received COVID-19 vaccination(s). Denies chest pain, dyspnea, dizziness, lightheadedness, syncope, near syncope. Denies any other upper or lower GI symptoms.  He is on Eliquis, glipizide, Metformin.   At the end of the visit the patient states he was sent to [place], [place] to evaluate his liver.  He was told "I have good news and I have bad news.  The good news is you do not have cancer, the bad news is you need to lay off the alcohol."  No definitive findings or recommendations otherwise were communicated.  Query likely element of fibrosis identified given recommendations.  Past Medical History:  Diagnosis Date  . Essential hypertension   . GERD (gastroesophageal reflux disease)   . Hypercholesteremia   . PAF (paroxysmal atrial fibrillation) ([place]) 05/04/2014  . Type 2 diabetes mellitus ([place])     Past Surgical History:  Procedure Laterality Date  . BIOPSY  02/05/2017   Procedure: BIOPSY;  Surgeon: Daneil Dolin, MD;  [facility];  Service: Endoscopy;;  gastric colon  . CHOLECYSTECTOMY    . COLONOSCOPY N/A 05/13/2013   Procedure: COLONOSCOPY;  Surgeon: Daneil Dolin, MD;  [facility];  Service: Endoscopy;  Laterality: N/A;  8:30 AM  . COLONOSCOPY N/A 02/05/2017   Procedure: COLONOSCOPY;  Surgeon: Daneil Dolin, MD;  [facility];  Service: Endoscopy;  Laterality: N/A;  . ESOPHAGOGASTRODUODENOSCOPY N/A 02/05/2017   Procedure: ESOPHAGOGASTRODUODENOSCOPY (EGD);  Surgeon: Daneil Dolin, MD;  [facility];  Service: Endoscopy;  Laterality:  N/A;  730 - moved to 12:00   . POLYPECTOMY  02/05/2017   Procedure: POLYPECTOMY;  Surgeon: Daneil Dolin, MD;  [facility];  Service: Endoscopy;;  colon    Current Outpatient Medications  Medication Sig Dispense Refill  . apixaban (ELIQUIS) 5 MG TABS tablet Take 1 tablet (5 mg total) by mouth 2 (two) times daily. 60 tablet 0  . CINNAMON PO Take 1,000 mg by mouth  daily.     Marland Kitchen diltiazem (CARDIZEM CD) 240 MG 24 hr capsule Take 240 mg by mouth daily.    . ferrous sulfate 325 (65 FE) MG EC tablet Take 325 mg by mouth in the morning and at bedtime.     . gabapentin (NEURONTIN) 100 MG capsule Take 100 mg by mouth at bedtime.    Marland Kitchen glipiZIDE (GLUCOTROL) 5 MG tablet Take 5 mg by mouth daily as needed (high blood sugar).     . hydrochlorothiazide (HYDRODIURIL) 12.5 MG tablet Take 12.5 mg by mouth daily.    [name] 350 MG CAPS Take 350 mg by mouth daily.    Marland Kitchen losartan (COZAAR) 50 MG tablet Take 50 mg by mouth daily.    . metFORMIN (GLUCOPHAGE) 500 MG tablet Take 1,000 mg by mouth 2 (two) times daily with a meal.    . Multiple Vitamin (MULTIVITAMIN WITH MINERALS) TABS tablet Take 1 tablet by mouth daily.    Marland Kitchen omeprazole (PRILOSEC) 20 MG capsule Take 20 mg by mouth daily.    . simvastatin (ZOCOR) 40 MG tablet Take 40 mg by mouth every evening.      No current facility-administered medications for this visit.    Allergies as of 03/22/2020 - Review Complete 03/22/2020  Allergen Reaction Noted  . Rabbit protein Swelling 05/13/2013  . Hydrocodone-acetaminophen Rash 12/07/2015  . Other Swelling and Rash 05/13/2013  . Ivp dye [iodinated diagnostic agents] Other (See Comments) 01/30/2017    Family History  Problem Relation Age of Onset  . Prostate cancer Brother   . Congestive Heart Failure Mother   . Heart failure Brother   . Colon cancer Neg Hx     Social History   Socioeconomic History  . Marital status: Married    Spouse name: Not on file  . Number of children: Not on file  . Years of education: Not on file  . Highest education level: Not on file  Occupational History  . Not on file  Tobacco Use  . Smoking status: Former Smoker    Packs/day: 2.00    Years: 10.00    Pack years: 20.00    Types: Cigarettes    Start date: 05/25/1956    Quit date: 05/25/2009    Years since quitting: 10.8  . Smokeless tobacco: Current User    Types: Chew    Vaping Use  . Vaping Use: Never used  Substance and Sexual Activity  . Alcohol use: Yes    Alcohol/week: 0.0 standard drinks    Comment: Rare - 1 beer every 3 months  . Drug use: No  . Sexual activity: Not on file  Other Topics Concern  . Not on file  Social History Narrative  . Not on file   Social Determinants of Health   Financial Resource Strain:   . Difficulty of Paying Living Expenses:   Food Insecurity:   . Worried About Charity fundraiser in the Last Year:   . Arboriculturist in the Last Year:   Transportation Needs:   .  Lack of Transportation (Medical):   Marland Kitchen Lack of Transportation (Non-Medical):   Physical Activity:   . Days of Exercise per Week:   . Minutes of Exercise per Session:   Stress:   . Feeling of Stress :   Social Connections:   . Frequency of Communication with Friends and Family:   . Frequency of Social Gatherings with Friends and Family:   . Attends Religious Services:   . Active Member of Clubs or Organizations:   . Attends Archivist Meetings:   Marland Kitchen Marital Status:   Intimate Partner Violence:   . Fear of Current or Ex-Partner:   . Emotionally Abused:   Marland Kitchen Physically Abused:   . Sexually Abused:     Subjective: Review of Systems  Constitutional: Negative for chills, fever, malaise/fatigue and weight loss.  HENT: Negative for congestion and sore throat.   Respiratory: Negative for cough and shortness of breath.   Cardiovascular: Negative for chest pain and palpitations.  Gastrointestinal: Negative for abdominal pain, blood in stool, diarrhea, melena, nausea and vomiting.  Musculoskeletal: Negative for joint pain and myalgias.  Skin: Negative for rash.  Neurological: Negative for dizziness and weakness.  Endo/Heme/Allergies: Does not bruise/bleed easily.  Psychiatric/Behavioral: Negative for depression. The patient is not nervous/anxious.   All other systems reviewed and are negative.      Objective: BP (!) 144/80   Pulse 77    Temp (!) 97.1 F (36.2 C)   Ht 5\' 11"  (1.803 m)   Wt 194 lb (88 kg)   BMI 27.06 kg/m  Physical Exam Vitals and nursing note reviewed.  Constitutional:      General: He is not in acute distress.    Appearance: Normal appearance. He is not ill-appearing, toxic-appearing or diaphoretic.  HENT:     Head: Normocephalic and atraumatic.     Nose: No congestion or rhinorrhea.  Eyes:     General: No scleral icterus. Cardiovascular:     Rate and Rhythm: Normal rate and regular rhythm.     Heart sounds: Normal heart sounds.  Pulmonary:     Effort: Pulmonary effort is normal.     Breath sounds: Normal breath sounds.  Abdominal:     General: Bowel sounds are normal. There is no distension.     Palpations: Abdomen is soft. There is no hepatomegaly, splenomegaly or mass.     Tenderness: There is no abdominal tenderness. There is no guarding or rebound.     Hernia: No hernia is present.  Musculoskeletal:     Cervical back: Neck supple.  Skin:    General: Skin is warm and dry.     Coloration: Skin is not jaundiced.     Findings: No bruising or rash.  Neurological:     General: No focal deficit present.     Mental Status: He is alert and oriented to person, place, and time. Mental status is at baseline.  Psychiatric:        Mood and Affect: Mood normal.        Behavior: Behavior normal.        Thought Content: Thought content normal.       03/22/2020 9:03 AM   Disclaimer: This note was dictated with voice recognition software. Similar sounding words can inadvertently be transcribed and may not be corrected upon review.

## 2020-03-22 NOTE — Assessment & Plan Note (Signed)
History of IDA, noted to be somewhat worse based on iron saturation percentage.  His hemoglobin does appear stable.  He did have heme positive stool as per below.  EGD and colonoscopy in 2018 essentially unremarkable other than noted esophageal varices, as per above.  I will discuss with Dr. Work on final plan, see tentative recommendations below.  Follow-up in 4 months.

## 2020-03-22 NOTE — Patient Instructions (Signed)
Your health issues we discussed today were:   History of low iron anemia and blood in your stool: 1. Have your labs checked when you are able to 2. I will discuss with Dr. Gala Romney if we need to proceed with EGD, possible capsule study, possible colonoscopy 3. Further recommendations will follow 4. Call us if you see any significant or large amount of bleeding  Low platelet count and swollen blood vessels in your swallowing tube (esophagus): 1. Have your labs drawn when you are able to (these will need to be drawn at [facility], near your primary care) 2. We will help schedule your ultrasound for you 3. Further recommendations will follow  Overall I recommend:  1. Continue your other current medications 2. Return for follow-up in 4 months 3. Call us for any questions or concerns   ---------------------------------------------------------------  I am glad you have gotten your COVID-19 vaccination!  Even though you are fully vaccinated you should continue to follow CDC and state/local guidelines.  ---------------------------------------------------------------   At [facility] we value your feedback. You may receive a survey about your visit today. Please share your experience as we strive to create trusting relationships with our patients to provide genuine, compassionate, quality care.  We appreciate your understanding and patience as we review any laboratory studies, imaging, and other diagnostic tests that are ordered as we care for you. Our office policy is 5 business days for review of these results, and any emergent or urgent results are addressed in a timely manner for your best interest. If you do not hear from our office in 1 week, please contact us.   We also encourage the use of MyChart, which contains your medical information for your review as well. If you are not enrolled in this feature, an access code is on this after visit summary for your convenience. Thank  you for allowing [place] to be involved in your care.  It was great to see you today!  I hope you have a great Summer!!

## 2020-03-24 LAB — HCV FIBROSURE
ALPHA 2-MACROGLOBULINS, QN: 302 mg/dL — ABNORMAL HIGH (ref 110–276)
ALT (SGPT) P5P: 54 IU/L (ref 0–55)
Apolipoprotein A-1: 125 mg/dL (ref 101–178)
Bilirubin, Total: 0.3 mg/dL (ref 0.0–1.2)
Fibrosis Score: 0.5 — ABNORMAL HIGH (ref 0.00–0.21)
GGT: 38 IU/L (ref 0–65)
Haptoglobin: 181 mg/dL (ref 34–355)
Necroinflammat Activity Score: 0.39 — ABNORMAL HIGH (ref 0.00–0.17)

## 2020-03-24 LAB — FERRITIN: Ferritin: 36 ng/mL (ref 30–400)

## 2020-03-30 ENCOUNTER — Ambulatory Visit (HOSPITAL_COMMUNITY)
Admission: RE | Admit: 2020-03-30 | Discharge: 2020-03-30 | Disposition: A | Discharge status: Home / Self Care | Payer: Medicare Other | Source: Ambulatory Visit | Attending: Nurse Practitioner | Admitting: Nurse Practitioner

## 2020-03-30 ENCOUNTER — Other Ambulatory Visit: Payer: Self-pay

## 2020-03-30 DIAGNOSIS — R195 Other fecal abnormalities: Secondary | ICD-10-CM

## 2020-03-30 DIAGNOSIS — D509 Iron deficiency anemia, unspecified: Secondary | ICD-10-CM | POA: Diagnosis present

## 2020-03-30 DIAGNOSIS — I8500 Esophageal varices without bleeding: Secondary | ICD-10-CM | POA: Insufficient documentation

## 2020-03-30 DIAGNOSIS — D696 Thrombocytopenia, unspecified: Secondary | ICD-10-CM | POA: Insufficient documentation

## 2020-04-11 ENCOUNTER — Telehealth: Payer: Self-pay | Admitting: Nurse Practitioner

## 2020-04-11 NOTE — Telephone Encounter (Signed)
Spoke with pt. Pt is aware of RMR and EG recommendations. Pt is aware that he will receive a call to schedule EGD.

## 2020-04-11 NOTE — Telephone Encounter (Signed)
AM please see below to get clearance to hold eliquis thanks

## 2020-04-11 NOTE — Telephone Encounter (Signed)
Letter faxed to Dr. Nolon Rod office. Waiting on a response.

## 2020-04-11 NOTE — Telephone Encounter (Signed)
Please tell the patient after discussing with Dr. Gala Romney, we feel it'd be best to repeat his upper endoscopy and plan for possible capsule placement to evaluate for any other causes of bleeding.  Please schedule for EGD +/- capsule with RMR on conscious sedation. Previously discussed procedure possibility at his OV.   ASA II  Glucophage: Half night before, none morning of Glipizide: Half night before, none morning of Iron: Please hold 1 week prior to procedure Eliquis: Please reach out to PCP ? Ok to hold for 2 days prior to procedure

## 2020-04-11 NOTE — Telephone Encounter (Signed)
Yes, faxing letter soon per EG.

## 2020-04-16 ENCOUNTER — Encounter: Payer: Self-pay | Admitting: Nurse Practitioner

## 2020-05-14 NOTE — Telephone Encounter (Signed)
pts wife called questioning when the pt will have his procedure. Advised her that the nurses were waiting for a response from Dr.Fusco about holding his eliquis. She stated Dr.McDowell is the one who started him on the medication and asked me to send it to him. Her name is Labrandon Knoch and her phone number is [phone].   Dr.McDowell, please see the message below from Walden Field NP, is it ok to hold the patients Eliquis prior to his procedure with Dr.Rourk?

## 2020-05-15 NOTE — Telephone Encounter (Signed)
PA for EGD/[facility] submitted via [facility] website. Case pended. Tracking# [id number].  Message sent to Neil Crouch PA to read Givens.

## 2020-05-15 NOTE — Telephone Encounter (Signed)
Pt's wife called office, EGD/-/+Givens scheduled for 07/04/20 at 12:45pm. COVID test scheduled 07/03/20 at 9:45am. Orders entered. Appt letter mailed with procedure instructions. Informed wife cardiology ok with him holding Eliquis for 48 hours prior to procedure.

## 2020-05-15 NOTE — Telephone Encounter (Signed)
Thank you for letting me know.  Yes, he may hold Eliquis 48 hours prior to endoscopy as requested.

## 2020-05-15 NOTE — Telephone Encounter (Signed)
Thank you Dr.McDowell.  Routing to [employer] for [place] and routing to the clinical pool to schedule procedure.

## 2020-05-15 NOTE — Telephone Encounter (Signed)
Tried to call pt, no answer, LMOVM for return call.  

## 2020-05-16 NOTE — Telephone Encounter (Signed)
Noted, thanks!

## 2020-05-17 ENCOUNTER — Other Ambulatory Visit: Payer: Self-pay

## 2020-05-17 NOTE — Telephone Encounter (Signed)
EGD/Givens approved. PA# [id number], valid 07/04/20-09/14/20.

## 2020-07-03 ENCOUNTER — Other Ambulatory Visit (HOSPITAL_COMMUNITY)
Admission: RE | Admit: 2020-07-03 | Discharge: 2020-07-03 | Disposition: A | Discharge status: Home / Self Care | Payer: Medicare Other | Source: Ambulatory Visit | Attending: Internal Medicine | Admitting: Internal Medicine

## 2020-07-03 ENCOUNTER — Other Ambulatory Visit: Payer: Self-pay

## 2020-07-03 DIAGNOSIS — Z01812 Encounter for preprocedural laboratory examination: Secondary | ICD-10-CM | POA: Diagnosis present

## 2020-07-03 DIAGNOSIS — Z20822 Contact with and (suspected) exposure to covid-19: Secondary | ICD-10-CM | POA: Diagnosis not present

## 2020-07-03 LAB — SARS CORONAVIRUS 2 (TAT 6-24 HRS): SARS Coronavirus 2: NEGATIVE

## 2020-07-04 ENCOUNTER — Encounter (HOSPITAL_COMMUNITY): Payer: Self-pay | Admitting: Internal Medicine

## 2020-07-04 ENCOUNTER — Encounter: Payer: Self-pay | Admitting: Internal Medicine

## 2020-07-04 ENCOUNTER — Ambulatory Visit (HOSPITAL_COMMUNITY)
Admission: RE | Admit: 2020-07-04 | Discharge: 2020-07-04 | Disposition: A | Discharge status: Home / Self Care | Payer: Medicare Other | Attending: Internal Medicine | Admitting: Internal Medicine

## 2020-07-04 ENCOUNTER — Encounter (HOSPITAL_COMMUNITY)
Admission: RE | Disposition: A | Discharge status: Home / Self Care | Payer: Self-pay | Source: Home / Self Care | Attending: Internal Medicine

## 2020-07-04 ENCOUNTER — Other Ambulatory Visit: Payer: Self-pay

## 2020-07-04 DIAGNOSIS — E7800 Pure hypercholesterolemia, unspecified: Secondary | ICD-10-CM | POA: Diagnosis not present

## 2020-07-04 DIAGNOSIS — R195 Other fecal abnormalities: Secondary | ICD-10-CM | POA: Diagnosis not present

## 2020-07-04 DIAGNOSIS — I8510 Secondary esophageal varices without bleeding: Secondary | ICD-10-CM | POA: Diagnosis not present

## 2020-07-04 DIAGNOSIS — I8500 Esophageal varices without bleeding: Secondary | ICD-10-CM | POA: Insufficient documentation

## 2020-07-04 DIAGNOSIS — D509 Iron deficiency anemia, unspecified: Secondary | ICD-10-CM | POA: Insufficient documentation

## 2020-07-04 DIAGNOSIS — E119 Type 2 diabetes mellitus without complications: Secondary | ICD-10-CM | POA: Insufficient documentation

## 2020-07-04 DIAGNOSIS — K2980 Duodenitis without bleeding: Secondary | ICD-10-CM | POA: Diagnosis not present

## 2020-07-04 DIAGNOSIS — K219 Gastro-esophageal reflux disease without esophagitis: Secondary | ICD-10-CM | POA: Insufficient documentation

## 2020-07-04 DIAGNOSIS — I10 Essential (primary) hypertension: Secondary | ICD-10-CM | POA: Insufficient documentation

## 2020-07-04 DIAGNOSIS — I480 Paroxysmal atrial fibrillation: Secondary | ICD-10-CM | POA: Insufficient documentation

## 2020-07-04 HISTORY — PX: ESOPHAGOGASTRODUODENOSCOPY: SHX5428

## 2020-07-04 HISTORY — PX: GIVENS CAPSULE STUDY: SHX5432

## 2020-07-04 LAB — CBC
HCT: 32.3 % — ABNORMAL LOW (ref 39.0–52.0)
Hemoglobin: 10.5 g/dL — ABNORMAL LOW (ref 13.0–17.0)
MCH: 29.8 pg (ref 26.0–34.0)
MCHC: 32.5 g/dL (ref 30.0–36.0)
MCV: 91.8 fL (ref 80.0–100.0)
Platelets: 77 10*3/uL — ABNORMAL LOW (ref 150–400)
RBC: 3.52 MIL/uL — ABNORMAL LOW (ref 4.22–5.81)
RDW: 13.4 % (ref 11.5–15.5)
WBC: 3.7 10*3/uL — ABNORMAL LOW (ref 4.0–10.5)
nRBC: 0 % (ref 0.0–0.2)

## 2020-07-04 LAB — GLUCOSE, CAPILLARY: Glucose-Capillary: 135 mg/dL — ABNORMAL HIGH (ref 70–99)

## 2020-07-04 SURGERY — EGD (ESOPHAGOGASTRODUODENOSCOPY)
Anesthesia: Moderate Sedation

## 2020-07-04 MED ORDER — MIDAZOLAM HCL 5 MG/5ML IJ SOLN
INTRAMUSCULAR | Status: DC | PRN
  Administered 2020-07-04: 2 mg via INTRAVENOUS
  Administered 2020-07-04: 1 mg via INTRAVENOUS
  Administered 2020-07-04: 2 mg via INTRAVENOUS

## 2020-07-04 MED ORDER — MEPERIDINE HCL 50 MG/ML IJ SOLN
INTRAMUSCULAR | Status: AC
  Filled 2020-07-04: qty 1

## 2020-07-04 MED ORDER — MIDAZOLAM HCL 5 MG/5ML IJ SOLN
INTRAMUSCULAR | Status: AC
  Filled 2020-07-04: qty 10

## 2020-07-04 MED ORDER — LIDOCAINE VISCOUS HCL 2 % MT SOLN
OROMUCOSAL | Status: DC | PRN
  Administered 2020-07-04: 1 via OROMUCOSAL

## 2020-07-04 MED ORDER — ONDANSETRON HCL 4 MG/2ML IJ SOLN
INTRAMUSCULAR | Status: DC | PRN
  Administered 2020-07-04: 4 mg via INTRAVENOUS

## 2020-07-04 MED ORDER — LIDOCAINE VISCOUS HCL 2 % MT SOLN
OROMUCOSAL | Status: AC
  Filled 2020-07-04: qty 15

## 2020-07-04 MED ORDER — MEPERIDINE HCL 100 MG/ML IJ SOLN
INTRAMUSCULAR | Status: DC | PRN
Start: 2020-07-04 — End: 2020-07-04
  Administered 2020-07-04: 25 mg via INTRAVENOUS
  Administered 2020-07-04: 15 mg via INTRAVENOUS

## 2020-07-04 MED ORDER — STERILE WATER FOR IRRIGATION IR SOLN
Status: DC | PRN
  Administered 2020-07-04: 2.5 mL

## 2020-07-04 MED ORDER — ONDANSETRON HCL 4 MG/2ML IJ SOLN
INTRAMUSCULAR | Status: AC
  Filled 2020-07-04: qty 2

## 2020-07-04 MED ORDER — SODIUM CHLORIDE 0.9 % IV SOLN: INTRAVENOUS | Status: DC

## 2020-07-04 NOTE — Op Note (Signed)
[facility] Patient Name: Derek Drake Procedure Date: 07/04/2020 10:45 AM MRN: [id number] Date of Birth: 14-Oct-1945 Attending MD: Norvel Richards , MD CSN: [id number] Age: 74 Admit Type: Outpatient Procedure:                Upper GI endoscopy Indications:              Unexplained iron deficiency anemia, Occult blood in                            stool Providers:                Norvel Richards, MD, Lurline Del, RN, Dereck Leep, Technician, Aram Candela Referring MD:              Medicines:                Midazolam 5 mg IV, Meperidine 40 mg IV Complications:            No immediate complications. Estimated Blood Loss:     Estimated blood loss was minimal. Procedure:                Pre-Anesthesia Assessment:                           - Prior to the procedure, a History and Physical                            was performed, and patient medications and                            allergies were reviewed. The patient's tolerance of                            previous anesthesia was also reviewed. The risks                            and benefits of the procedure and the sedation                            options and risks were discussed with the patient.                            All questions were answered, and informed consent                            was obtained. Prior Anticoagulants: The patient                            last took Eliquis (apixaban) 2 days prior to the                            procedure. ASA Grade Assessment: III - A patient  with severe systemic disease. After reviewing the                            risks and benefits, the patient was deemed in                            satisfactory condition to undergo the procedure.                           After obtaining informed consent, the endoscope was                            passed under direct vision. Throughout the                             procedure, the patient's blood pressure, pulse, and                            oxygen saturations were monitored continuously. The                            [id number]-[id number] ([id number]) scope was introduced through the                            mouth, and advanced to the second part of duodenum.                            The upper GI endoscopy was accomplished without                            difficulty. The patient tolerated the procedure                            well. Scope In: 12:55:15 PM Scope Out: 1:05:03 PM Total Procedure Duration: 0 hours 9 minutes 48 seconds  Findings:      Grade I varices were found; 2 short columns mid esophagus without       bleeding stigmata. Gastric cavity empty. Ectatic changes in the antrum.       Query early [place]. No gastric varices, ulcer or infiltrating process       observed. Patent pylorus.      Examination of the bulb and second portion revealed an adenomatous       appearing flat area in the bulb. Please see photos. This area was       biopsied.      Given gastric and duodenal findings/biopsy I elected not to place a       capsule today. Impression:               - Grade I esophageal varices.                           - Possible early GAVE; duodenal lesion - biopsied.                            I suspect pt is  loosing blood slowly from stomach. Moderate Sedation:      Moderate (conscious) sedation was administered by the endoscopy nurse       and supervised by the endoscopist. The following parameters were       monitored: oxygen saturation, heart rate, blood pressure, respiratory       rate, EKG, adequacy of pulmonary ventilation, and response to care.       Total physician intraservice time was 10 minutes. Recommendation:           - Patient has a contact number available for                            emergencies. The signs and symptoms of potential                            delayed complications were discussed with the                             patient. Return to normal activities tomorrow.                            Written discharge instructions were provided to the                            patient.                           - Resume regular diet. F/U path. CBC today. OV 6                            weeks for cirrhosis care Procedure Code(s):        --- Professional ---                           [phone], Esophagogastroduodenoscopy, flexible,                            transoral; diagnostic, including collection of                            specimen(s) by brushing or washing, when performed                            (separate procedure)                           G0500, Moderate sedation services provided by the                            same physician or other qualified health care                            professional performing a gastrointestinal                            endoscopic service that sedation supports,  requiring the presence of an independent trained                            observer to assist in the monitoring of the                            patient's level of consciousness and physiological                            status; initial 15 minutes of intra-service time;                            patient age 53 years or older (additional time may                            be reported with [phone], as appropriate) Diagnosis Code(s):        --- Professional ---                           I85.00, Esophageal varices without bleeding                           D50.9, Iron deficiency anemia, unspecified                           R19.5, Other fecal abnormalities CPT copyright 2019 American Medical Association. All rights reserved. The codes documented in this report are preliminary and upon coder review may  be revised to meet current compliance requirements. Cristopher Estimable. Adely Facer, MD Norvel Richards, MD 07/04/2020 1:24:07 PM This report has been signed electronically. Number of  Addenda: 0

## 2020-07-04 NOTE — Discharge Instructions (Signed)
EGD Discharge instructions Please read the instructions outlined below and refer to this sheet in the next few weeks. These discharge instructions provide you with general information on caring for yourself after you leave the hospital. Your doctor may also give you specific instructions. While your treatment has been planned according to the most current medical practices available, unavoidable complications occasionally occur. If you have any problems or questions after discharge, please call your doctor. ACTIVITY  You may resume your regular activity but move at a slower pace for the next 24 hours.   Take frequent rest periods for the next 24 hours.   Walking will help expel (get rid of) the air and reduce the bloated feeling in your abdomen.   No driving for 24 hours (because of the anesthesia (medicine) used during the test).   You may shower.   Do not sign any important legal documents or operate any machinery for 24 hours (because of the anesthesia used during the test).  NUTRITION  Drink plenty of fluids.   You may resume your normal diet.   Begin with a light meal and progress to your normal diet.   Avoid alcoholic beverages for 24 hours or as instructed by your caregiver.  MEDICATIONS  You may resume your normal medications unless your caregiver tells you otherwise.  WHAT YOU CAN EXPECT TODAY  You may experience abdominal discomfort such as a feeling of fullness or "gas" pains.  FOLLOW-UP  Your doctor will discuss the results of your test with you.  SEEK IMMEDIATE MEDICAL ATTENTION IF ANY OF THE FOLLOWING OCCUR:  Excessive nausea (feeling sick to your stomach) and/or vomiting.   Severe abdominal pain and distention (swelling).   Trouble swallowing.   Temperature over 101 F (37.8 C).   Rectal bleeding or vomiting of blood.   Your stomach was congested.  Likely source of slow bleeding while on blood thinner therapy  Small nodule in your small intestine  (duodenum).  Biopsied.  No capsule placed in your small intestine today because of biopsies performed (may not ever need a capsule study of your small bowel)  Resume Eliquis tomorrow.  Further recommendations to follow pending review of pathology report  Office visit with Walden Field in 6 weeks  Patient request, I called Jerrye Bushy at [phone] -left message on answering service.

## 2020-07-04 NOTE — H&P (Signed)
@LOGO @   Primary Care Physician:  [name], MD Primary Gastroenterologist:  Dr. Gala Romney  Pre-Procedure History & Physical: HPI:  Derek Drake is a 74 y.o. male here for further evaluation of iron deficiency anemia and Hemoccult positive stool.  Chronically anticoagulated.  Here for EGD and capsule deployment to small bowel depending on EGD findings.  Past Medical History:  Diagnosis Date  . Essential hypertension   . GERD (gastroesophageal reflux disease)   . Hypercholesteremia   . PAF (paroxysmal atrial fibrillation) ([place]) 05/04/2014  . Type 2 diabetes mellitus ([place])     Past Surgical History:  Procedure Laterality Date  . BIOPSY  02/05/2017   Procedure: BIOPSY;  Surgeon: Daneil Dolin, MD;  [facility];  Service: Endoscopy;;  gastric colon  . CHOLECYSTECTOMY    . COLONOSCOPY N/A 05/13/2013   Procedure: COLONOSCOPY;  Surgeon: Daneil Dolin, MD;  [facility];  Service: Endoscopy;  Laterality: N/A;  8:30 AM  . COLONOSCOPY N/A 02/05/2017   Procedure: COLONOSCOPY;  Surgeon: Daneil Dolin, MD;  [facility];  Service: Endoscopy;  Laterality: N/A;  . ESOPHAGOGASTRODUODENOSCOPY N/A 02/05/2017   Procedure: ESOPHAGOGASTRODUODENOSCOPY (EGD);  Surgeon: Daneil Dolin, MD;  [facility];  Service: Endoscopy;  Laterality: N/A;  730 - moved to 12:00   . POLYPECTOMY  02/05/2017   Procedure: POLYPECTOMY;  Surgeon: Daneil Dolin, MD;  [facility];  Service: Endoscopy;;  colon    Prior to Admission medications   Medication Sig Start Date End Date Taking? Authorizing Provider  apixaban (ELIQUIS) 5 MG TABS tablet Take 1 tablet (5 mg total) by mouth 2 (two) times daily. 05/05/14  Yes [name], MD  Cinnamon 500 MG TABS Take 1,000 mg by mouth daily.    Yes [provider]  diltiazem (CARDIZEM CD) 360 MG 24 hr capsule Take 360 mg by mouth daily.    Yes [provider]  ferrous sulfate 325 (65 FE) MG EC  tablet Take 325 mg by mouth in the morning and at bedtime.    Yes [provider]  fluocinonide cream (LIDEX) 1.60 % Apply 1 application topically daily as needed for rash. 05/30/20  Yes [provider]  gabapentin (NEURONTIN) 100 MG capsule Take 100 mg by mouth at bedtime. 07/07/19  Yes [provider]  glipiZIDE (GLUCOTROL) 5 MG tablet Take 5 mg by mouth daily before breakfast.  02/09/15  Yes [provider]  hydrochlorothiazide (HYDRODIURIL) 12.5 MG tablet Take 12.5 mg by mouth daily. 07/23/19  Yes [provider]  [name] 350 MG CAPS Take 350 mg by mouth daily.   Yes [provider]  losartan (COZAAR) 100 MG tablet Take 100 mg by mouth daily.    Yes [provider]  metFORMIN (GLUCOPHAGE) 500 MG tablet Take 1,000 mg by mouth 2 (two) times daily with a meal.   Yes [provider]  Multiple Vitamin (MULTIVITAMIN WITH MINERALS) TABS tablet Take 1 tablet by mouth daily.   Yes [provider]  omeprazole (PRILOSEC) 20 MG capsule Take 20 mg by mouth daily.   Yes [provider]  simvastatin (ZOCOR) 40 MG tablet Take 40 mg by mouth every evening.    Yes [provider]    Allergies as of 05/15/2020 - Review Complete 03/22/2020  Allergen Reaction Noted  . Rabbit protein Swelling 05/13/2013  . Hydrocodone-acetaminophen Rash 12/07/2015  . Other Swelling and Rash 05/13/2013  . Ivp dye [  iodinated diagnostic agents] Other (See Comments) 01/30/2017    Family History  Problem Relation Age of Onset  . Prostate cancer Brother   . Congestive Heart Failure Mother   . Heart failure Brother   . Colon cancer Neg Hx     Social History   Socioeconomic History  . Marital status: Married    Spouse name: Not on file  . Number of children: Not on file  . Years of education: Not on file  . Highest education level: Not on file  Occupational History  . Not on file  Tobacco Use  . Smoking status: Former  Smoker    Packs/day: 2.00    Years: 10.00    Pack years: 20.00    Types: Cigarettes    Start date: 05/25/1956    Quit date: 05/25/2009    Years since quitting: 11.1  . Smokeless tobacco: Current User    Types: Chew  Vaping Use  . Vaping Use: Never used  Substance and Sexual Activity  . Alcohol use: Yes    Alcohol/week: 0.0 standard drinks    Comment: Rare - 1 beer every 3 months  . Drug use: No  . Sexual activity: Not on file  Other Topics Concern  . Not on file  Social History Narrative  . Not on file   Social Determinants of Health   Financial Resource Strain:   . Difficulty of Paying Living Expenses: Not on file  Food Insecurity:   . Worried About Charity fundraiser in the Last Year: Not on file  . Ran Out of Food in the Last Year: Not on file  Transportation Needs:   . Lack of Transportation (Medical): Not on file  . Lack of Transportation (Non-Medical): Not on file  Physical Activity:   . Days of Exercise per Week: Not on file  . Minutes of Exercise per Session: Not on file  Stress:   . Feeling of Stress : Not on file  Social Connections:   . Frequency of Communication with Friends and Family: Not on file  . Frequency of Social Gatherings with Friends and Family: Not on file  . Attends Religious Services: Not on file  . Active Member of Clubs or Organizations: Not on file  . Attends Archivist Meetings: Not on file  . Marital Status: Not on file  Intimate Partner Violence:   . Fear of Current or Ex-Partner: Not on file  . Emotionally Abused: Not on file  . Physically Abused: Not on file  . Sexually Abused: Not on file    Review of Systems: See HPI, otherwise negative ROS  Physical Exam: BP (!) 160/78   Pulse 74   Temp 97.6 F (36.4 C) (Oral)   Resp 14   Ht 5\' 11"  (1.803 m)   Wt 88.9 kg   SpO2 99%   BMI 27.34 kg/m  General:   Alert,  Well-developed, well-nourished, pleasant and cooperative in NAD Neck:  Supple; no masses or thyromegaly.  No significant cervical adenopathy. Lungs:  Clear throughout to auscultation.   No wheezes, crackles, or rhonchi. No acute distress. Heart:  Regular rate and rhythm; no murmurs, clicks, rubs,  or gallops. Abdomen: Non-distended, normal bowel sounds.  Soft and nontender without appreciable mass or hepatosplenomegaly.  Pulses:  Normal pulses noted. Extremities:  Without clubbing or edema.  Impression/Plan: 74 year old gentleman with iron deficiency anemia Hemoccult positive stool.  Here for EGD and possible capsule deployment for further evaluation.  Eliquis held x3 days.  I have discussed this approach with the patient risk benefits limitations alternatives imponderables have been reviewed.  Further recommendations to follow.     Notice: This dictation was prepared with Dragon dictation along with smaller phrase technology. Any transcriptional errors that result from this process are unintentional and may not be corrected upon review.

## 2020-07-06 ENCOUNTER — Encounter: Payer: Self-pay | Admitting: Internal Medicine

## 2020-07-06 LAB — SURGICAL PATHOLOGY

## 2020-07-09 ENCOUNTER — Encounter (HOSPITAL_COMMUNITY): Payer: Self-pay | Admitting: Internal Medicine

## 2020-07-24 ENCOUNTER — Ambulatory Visit: Payer: Medicare Other | Admitting: Gastroenterology

## 2020-07-24 ENCOUNTER — Ambulatory Visit: Payer: Medicare Other | Admitting: Nurse Practitioner

## 2020-07-24 ENCOUNTER — Telehealth: Payer: Self-pay | Admitting: *Deleted

## 2020-07-24 NOTE — Telephone Encounter (Signed)
Spoke with patient.  Has mild bruising around outer eye.  Doesn't remember hitting it.  Did have endoscopy 07/04/20.  Eyeball is not involved.  No redness or swelling.  Reviewed labs: 07/05/20 Hgb 10.5  Hct 32.3 (beeing evaluated)  01/06/20  SCr 1.02  Wt. 88kg  Age 74.  Eliquis dose of 5mg  bid is appropriate.  Told pt to monitor.  If bruising gets worse to call and let us know.  He verbalized understanding and appreciated call.

## 2020-07-24 NOTE — Telephone Encounter (Signed)
Patient states that he is on Eliquis woke up this am with a black eye- no known injury. Wants to know if the Eliquis could be causing this.

## 2020-08-03 ENCOUNTER — Other Ambulatory Visit: Payer: Self-pay

## 2020-08-03 ENCOUNTER — Ambulatory Visit: Payer: Medicare Other | Admitting: Internal Medicine

## 2020-08-03 ENCOUNTER — Encounter: Payer: Self-pay | Admitting: Internal Medicine

## 2020-08-03 VITALS — BP 147/77 | HR 80 | Temp 96.6°F | Ht 70.0 in | Wt 192.8 lb

## 2020-08-03 DIAGNOSIS — Z79899 Other long term (current) drug therapy: Secondary | ICD-10-CM

## 2020-08-03 DIAGNOSIS — K7469 Other cirrhosis of liver: Secondary | ICD-10-CM | POA: Diagnosis not present

## 2020-08-03 DIAGNOSIS — D696 Thrombocytopenia, unspecified: Secondary | ICD-10-CM | POA: Diagnosis not present

## 2020-08-03 DIAGNOSIS — D509 Iron deficiency anemia, unspecified: Secondary | ICD-10-CM

## 2020-08-03 DIAGNOSIS — R1900 Intra-abdominal and pelvic swelling, mass and lump, unspecified site: Secondary | ICD-10-CM

## 2020-08-03 DIAGNOSIS — K2100 Gastro-esophageal reflux disease with esophagitis, without bleeding: Secondary | ICD-10-CM

## 2020-08-03 MED ORDER — PANTOPRAZOLE SODIUM 40 MG PO TBEC: 40.0000 mg | DELAYED_RELEASE_TABLET | Freq: Every day | ORAL | 11 refills | Status: DC

## 2020-08-03 NOTE — Progress Notes (Signed)
Primary Care Physician:  [name], MD Primary Gastroenterologist:  Dr. Gala Romney  Pre-Procedure History & Physical: HPI:  Derek Drake is a 74 y.o. male here for follow-up of iron deficiency anemia, splenomegaly, thrombocytopenia and increased hepatic fibrosis.  EGD last month demonstrated grade 1 esophageal varices and early antral vascular ectasia  - likely source of slow GI bleeding producing iron deficiency anemia.  Small bowel capsule not deployed.  Last colonoscopy 2018 demonstrated a benign polyp and focal colitis-nonspecific.  Patient endorses history of heavy alcohol use earlier in life and continued drinking hard liquor off and on until he was seen by Dr. Janae Sauce (hematologist) for thrombocytopenia at [facility] several years ago.  He was felt to have alcohol induced thrombocytopenia and was told to stop drinking at that time (which he did). 6.5 kPa of resistance on elastography. On fibrosure fibrosis stage came in it 0.50 with necroinflammatory activity score 0.39 -implies bridging fibrosis. Patient states he has 2-3 bowel movements daily on Metformin  - occasionally gets bloated  Has reflux symptoms frequently taking omeprazole 20 mg daily.  Wife states he has a big bottle of Tums with him at all times and takes them on a regular basis.  No dysphagia.  No esophagitis found on recent EGD.   Fiber sure and elastography demonstrate Past Medical History:  Diagnosis Date  . Essential hypertension   . GERD (gastroesophageal reflux disease)   . Hypercholesteremia   . PAF (paroxysmal atrial fibrillation) ([place]) 05/04/2014  . Type 2 diabetes mellitus ([place])     Past Surgical History:  Procedure Laterality Date  . BIOPSY  02/05/2017   Procedure: BIOPSY;  Surgeon: Daneil Dolin, MD;  [facility];  Service: Endoscopy;;  gastric colon  . CHOLECYSTECTOMY    . COLONOSCOPY N/A 05/13/2013   Procedure: COLONOSCOPY;  Surgeon: Daneil Dolin, MD;  [facility];  Service: Endoscopy;  Laterality: N/A;  8:30 AM  . COLONOSCOPY N/A 02/05/2017   Procedure: COLONOSCOPY;  Surgeon: Daneil Dolin, MD;  [facility];  Service: Endoscopy;  Laterality: N/A;  . ESOPHAGOGASTRODUODENOSCOPY N/A 02/05/2017   Procedure: ESOPHAGOGASTRODUODENOSCOPY (EGD);  Surgeon: Daneil Dolin, MD;  [facility];  Service: Endoscopy;  Laterality: N/A;  730 - moved to 12:00   . ESOPHAGOGASTRODUODENOSCOPY N/A 07/04/2020   Procedure: ESOPHAGOGASTRODUODENOSCOPY (EGD);  Surgeon: Daneil Dolin, MD;  [facility];  Service: Endoscopy;  Laterality: N/A;  12:45pm  . GIVENS CAPSULE STUDY N/A 07/04/2020   Procedure: GIVENS CAPSULE STUDY;  Surgeon: Daneil Dolin, MD;  [facility];  Service: Endoscopy;  Laterality: N/A;  Elected not to place a capsule today  . POLYPECTOMY  02/05/2017   Procedure: POLYPECTOMY;  Surgeon: Daneil Dolin, MD;  [facility];  Service: Endoscopy;;  colon    Prior to Admission medications   Medication Sig Start Date End Date Taking? Authorizing Provider  apixaban (ELIQUIS) 5 MG TABS tablet Take 1 tablet (5 mg total) by mouth 2 (two) times daily. 05/05/14  Yes [name], MD  Cinnamon 500 MG TABS Take 1,000 mg by mouth daily.    Yes [provider]  diltiazem (CARDIZEM CD) 360 MG 24 hr capsule Take 360 mg by mouth daily.    Yes [provider]  ferrous sulfate 325 (65 FE) MG EC tablet Take 325 mg by mouth in the morning and at bedtime.    Yes [provider]  fluocinonide cream (LIDEX) 1.61 % Apply 1 application topically daily as needed for rash. 05/30/20  Yes [provider]  gabapentin (NEURONTIN) 100 MG capsule Take 100 mg by mouth at bedtime. 07/07/19  Yes [provider]  glipiZIDE (GLUCOTROL) 5 MG tablet Take 5 mg by mouth daily before breakfast.  02/09/15  Yes [provider]  [name] 350 MG CAPS Take 350 mg by mouth daily.   Yes  [provider]  losartan (COZAAR) 100 MG tablet Take 100 mg by mouth daily.    Yes [provider]  metFORMIN (GLUCOPHAGE) 500 MG tablet Take 1,000 mg by mouth 2 (two) times daily with a meal.   Yes [provider]  Multiple Vitamin (MULTIVITAMIN WITH MINERALS) TABS tablet Take 1 tablet by mouth daily.   Yes [provider]  omeprazole (PRILOSEC) 20 MG capsule Take 20 mg by mouth daily.   Yes [provider]  simvastatin (ZOCOR) 40 MG tablet Take 40 mg by mouth every evening.    Yes [provider]    Allergies as of 08/03/2020 - Review Complete 08/03/2020  Allergen Reaction Noted  . Rabbit protein Swelling 05/13/2013  . Hydrocodone-acetaminophen Rash 12/07/2015  . Other Swelling and Rash 05/13/2013  . Ivp dye [iodinated diagnostic agents] Other (See Comments) 01/30/2017    Family History  Problem Relation Age of Onset  . Prostate cancer Brother   . Congestive Heart Failure Mother   . Heart failure Brother   . Colon cancer Neg Hx     Social History   Socioeconomic History  . Marital status: Married    Spouse name: Not on file  . Number of children: Not on file  . Years of education: Not on file  . Highest education level: Not on file  Occupational History  . Not on file  Tobacco Use  . Smoking status: Former Smoker    Packs/day: 2.00    Years: 10.00    Pack years: 20.00    Types: Cigarettes    Start date: 05/25/1956    Quit date: 05/25/2009    Years since quitting: 11.2  . Smokeless tobacco: Current User    Types: Chew  Vaping Use  . Vaping Use: Never used  Substance and Sexual Activity  . Alcohol use: Not Currently    Alcohol/week: 0.0 standard drinks    Comment: Rare - 1 beer every 3 months  . Drug use: No  . Sexual activity: Not on file  Other Topics Concern  . Not on file  Social History Narrative  . Not on file   Social Determinants of Health   Financial Resource Strain:   . Difficulty of Paying  Living Expenses: Not on file  Food Insecurity:   . Worried About Charity fundraiser in the Last Year: Not on file  . Ran Out of Food in the Last Year: Not on file  Transportation Needs:   . Lack of Transportation (Medical): Not on file  . Lack of Transportation (Non-Medical): Not on file  Physical Activity:   . Days of Exercise per Week: Not on file  . Minutes of Exercise per Session: Not on file  Stress:   . Feeling of Stress : Not on file  Social Connections:   . Frequency of Communication with Friends and Family: Not on file  . Frequency of Social Gatherings with Friends and Family: Not on file  . Attends Religious Services: Not on file  . Active Member of Clubs or  Organizations: Not on file  . Attends Archivist Meetings: Not on file  . Marital Status: Not on file  Intimate Partner Violence:   . Fear of Current or Ex-Partner: Not on file  . Emotionally Abused: Not on file  . Physically Abused: Not on file  . Sexually Abused: Not on file    Review of Systems: See HPI, otherwise negative ROS  Physical Exam: BP (!) 147/77   Pulse 80   Temp (!) 96.6 F (35.9 C) (Temporal)   Ht 5\' 10"  (1.778 m)   Wt 192 lb 12.8 oz (87.5 kg)   BMI 27.66 kg/m  General:   Alert,  Well-developed, well-nourished, pleasant and cooperative in NAD.  Accompanied by spouse Skin:  Intact without significant lesions or rashes. Eyes:  Sclera clear, no icterus.   Conjunctiva pink. Ears:  Normal auditory acuity. Nose:  No deformity, discharge,  or lesions. Mouth:  No deformity or lesions. Neck:  Supple; no masses or thyromegaly. No significant cervical adenopathy. Lungs:  Clear throughout to auscultation.   No wheezes, crackles, or rhonchi. No acute distress. Heart:  Regular rate and rhythm; no murmurs, clicks, rubs,  or gallops. Abdomen: Non-distended, normal bowel sounds.  Soft and nontender without appreciable mass or hepatosplenomegaly.  Pulses:  Normal pulses noted. Extremities:   Without clubbing or edema.  Impression/Plan: 74 year old gentleman with esophageal varices and likely early [place].  Known mild splenomegaly. increase fibrosure score. KPa on elastography within normal limits but this study is based on nonalcoholic liver disease.  In this setting, alcohol is a factor and therefore at this result is of limited value.  I do think the patient has an element of cirrhosis and early portal hypertension.  We discussed the implications this diagnosis at length.  Likely will continue to have chronic iron deficiency anemia due to slow GI bleeding (predominantly stomach)  GERD inadequately controlled as described above.  No alarm symptoms.  Esophagitis on recent EGD.  Bloating symptoms nonspecific.  Intermittent lack of appetite also nonspecific   Recommendations:  Stop omeprazole  Begin protonix 40 mg daily (disp 30 with 11 refills)  CBC, hepatic profile, INR, BMET, AFP today  Repeat ultrasound of liver July 2022  Office visit her January 2022    Notice: This dictation was prepared with Dragon dictation along with smaller phrase technology. Any transcriptional errors that result from this process are unintentional and may not be corrected upon review.

## 2020-08-03 NOTE — Patient Instructions (Signed)
Stop omeprazole  Begin protonix 40 mg daily (disp 30 with 11 refills)  CBC, hepatic profile, INR, BMET, AFP today  Repeat ultrasound of liver July 2022  Office visit her January 2022

## 2020-08-04 LAB — CBC WITH DIFFERENTIAL/PLATELET
Basophils Absolute: 0 10*3/uL (ref 0.0–0.2)
Basos: 0 %
EOS (ABSOLUTE): 0.1 10*3/uL (ref 0.0–0.4)
Eos: 2 %
Hematocrit: 39.1 % (ref 37.5–51.0)
Hemoglobin: 13.1 g/dL (ref 13.0–17.7)
Immature Grans (Abs): 0 10*3/uL (ref 0.0–0.1)
Immature Granulocytes: 1 %
Lymphocytes Absolute: 0.9 10*3/uL (ref 0.7–3.1)
Lymphs: 19 %
MCH: 29.7 pg (ref 26.6–33.0)
MCHC: 33.5 g/dL (ref 31.5–35.7)
MCV: 89 fL (ref 79–97)
Monocytes Absolute: 0.6 10*3/uL (ref 0.1–0.9)
Monocytes: 12 %
Neutrophils Absolute: 3.3 10*3/uL (ref 1.4–7.0)
Neutrophils: 66 %
Platelets: 115 10*3/uL — ABNORMAL LOW (ref 150–450)
RBC: 4.41 x10E6/uL (ref 4.14–5.80)
RDW: 13.3 % (ref 11.6–15.4)
WBC: 4.9 10*3/uL (ref 3.4–10.8)

## 2020-08-04 LAB — BASIC METABOLIC PANEL
BUN/Creatinine Ratio: 10 (ref 10–24)
BUN: 10 mg/dL (ref 8–27)
CO2: 24 mmol/L (ref 20–29)
Calcium: 10.1 mg/dL (ref 8.6–10.2)
Chloride: 100 mmol/L (ref 96–106)
Creatinine, Ser: 0.99 mg/dL (ref 0.76–1.27)
GFR calc Af Amer: 86 mL/min/{1.73_m2} (ref >59)
GFR calc non Af Amer: 75 mL/min/{1.73_m2} (ref >59)
Glucose: 126 mg/dL — ABNORMAL HIGH (ref 65–99)
Potassium: 4.7 mmol/L (ref 3.5–5.2)
Sodium: 140 mmol/L (ref 134–144)

## 2020-08-04 LAB — HEPATIC FUNCTION PANEL
ALT: 37 IU/L (ref 0–44)
AST: 33 IU/L (ref 0–40)
Albumin: 5.2 g/dL — ABNORMAL HIGH (ref 3.7–4.7)
Alkaline Phosphatase: 84 IU/L (ref 44–121)
Bilirubin Total: 0.4 mg/dL (ref 0.0–1.2)
Bilirubin, Direct: 0.14 mg/dL (ref 0.00–0.40)
Total Protein: 7.6 g/dL (ref 6.0–8.5)

## 2020-08-04 LAB — PROTIME-INR
INR: 1 (ref 0.9–1.2)
Prothrombin Time: 10.9 s (ref 9.1–12.0)

## 2020-08-04 LAB — AFP TUMOR MARKER: AFP, Serum, Tumor Marker: 2 ng/mL (ref 0.0–8.3)

## 2020-08-23 ENCOUNTER — Ambulatory Visit: Payer: Medicare Other | Admitting: Nurse Practitioner

## 2020-09-17 IMAGING — US US ABDOMEN COMPLETE W/ ELASTOGRAPHY
1 series · 12 of 19 positions shown · non-contrast
Comparison: Abdomen ultrasound on 03/22/2018

CLINICAL DATA: Thrombocytopenia and esophageal varices. Abnormal
liver function tests. Prior cholecystectomy.

EXAM:
ULTRASOUND ABDOMEN
ULTRASOUND HEPATIC ELASTOGRAPHY
TECHNIQUE: Sonography of the upper abdomen was performed. In addition,
ultrasound elastography evaluation of the liver was performed. A
region of interest was placed within the right lobe of the liver.
Following application of a compressive sonographic pulse, tissue
compressibility was assessed. Multiple assessments were performed at
the selected site. Median tissue compressibility was determined.
Previously, hepatic stiffness was assessed by shear wave velocity.
Based on recently published Society of Radiologists in Ultrasound
consensus article, reporting is now recommended to be performed in
the SI units of pressure (kiloPascals) representing hepatic
stiffness/elasticity. The obtained result is compared to the
published reference standards. (cACLD = compensated Advanced Chronic
Liver Disease)

[Series 1: us abdomen complete w/elastography · 12 of 19 slices shown]
[im 1/19]
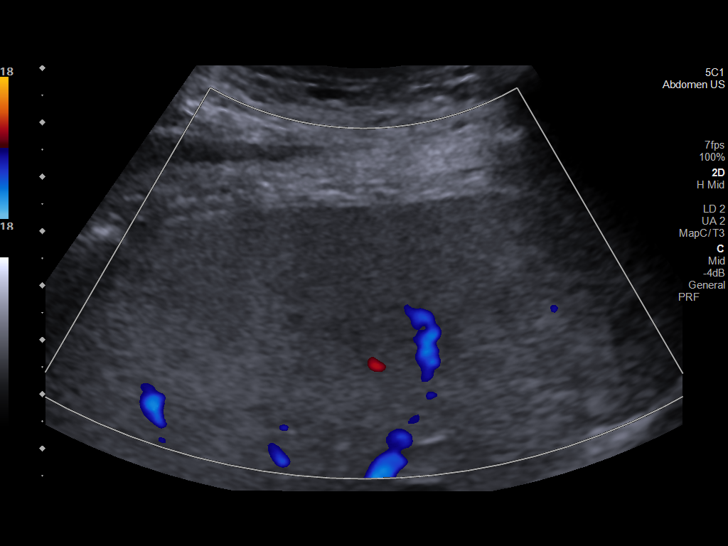
[im 3/19]
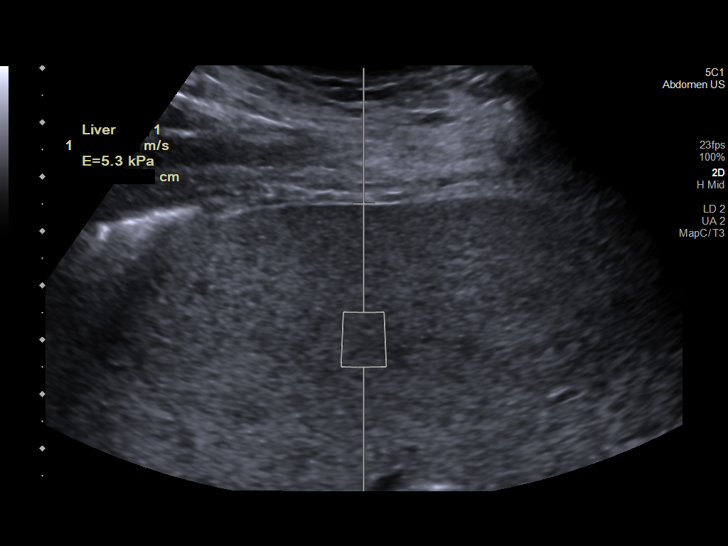
[im 4/19]
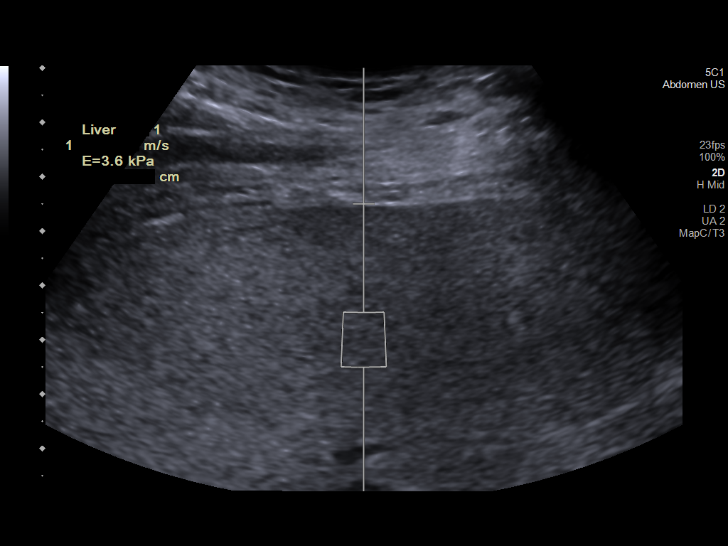
[im 6/19]
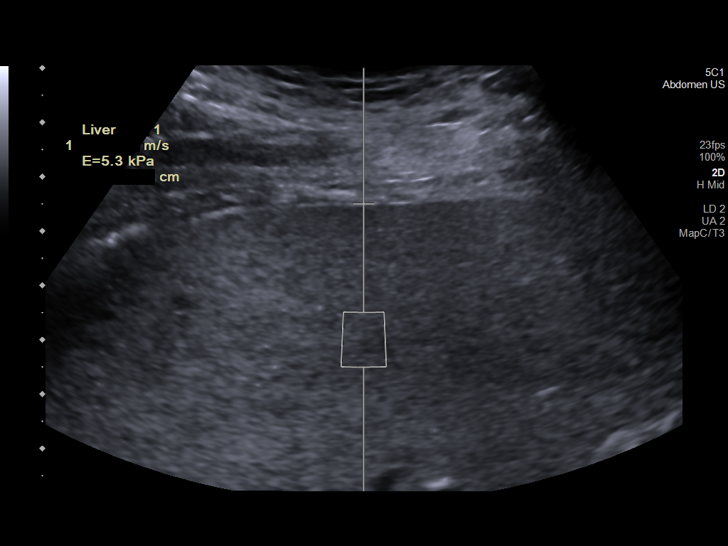
[im 8/19]
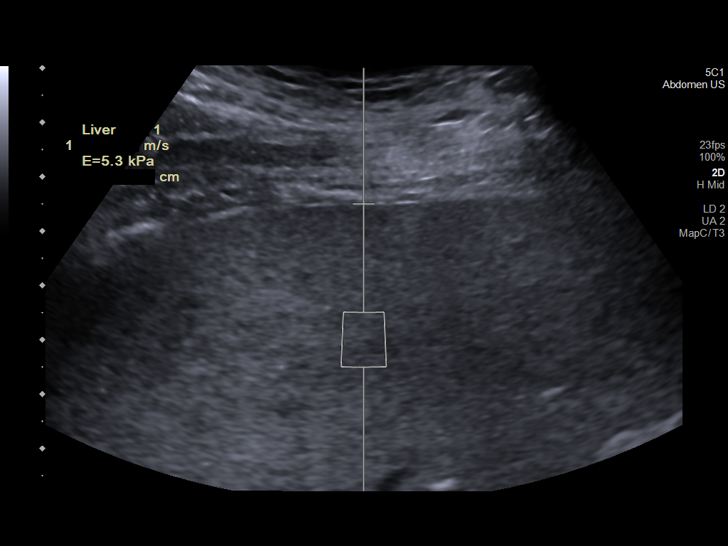
[im 9/19]
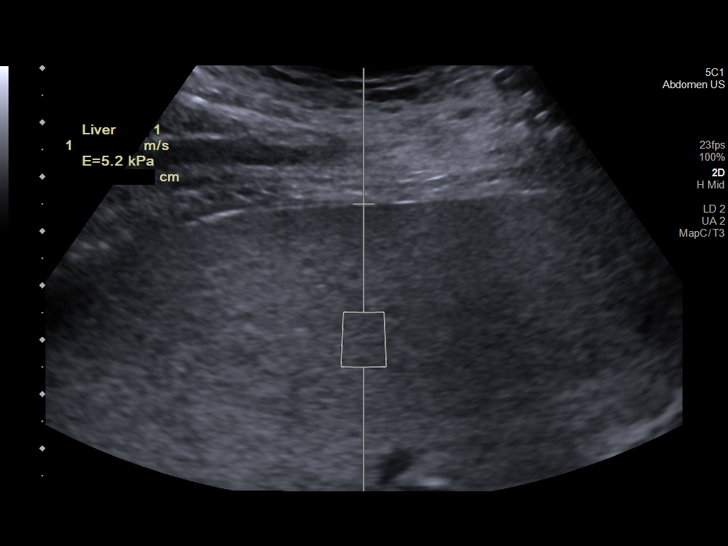
[im 11/19]
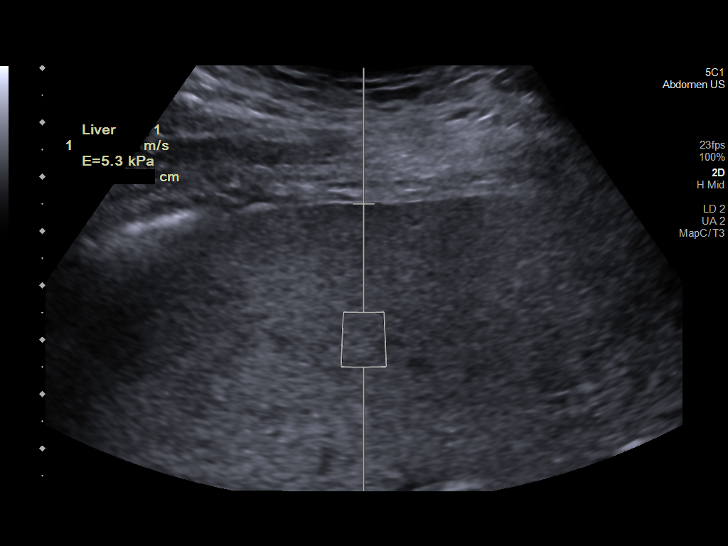
[im 12/19]
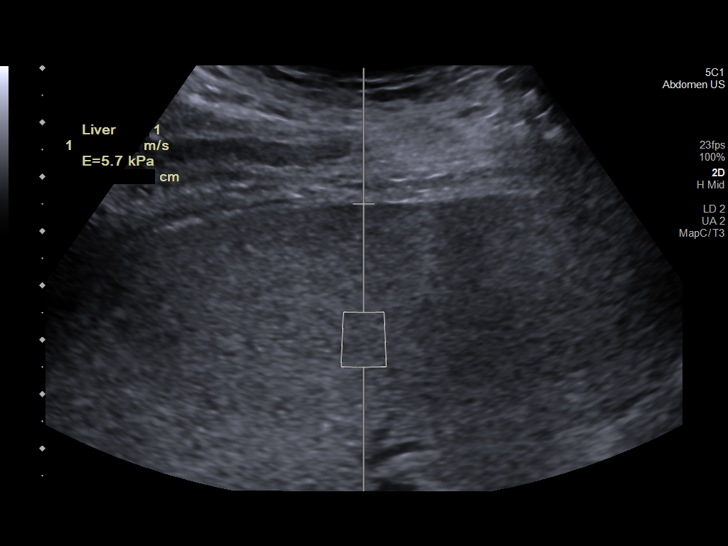
[im 14/19]
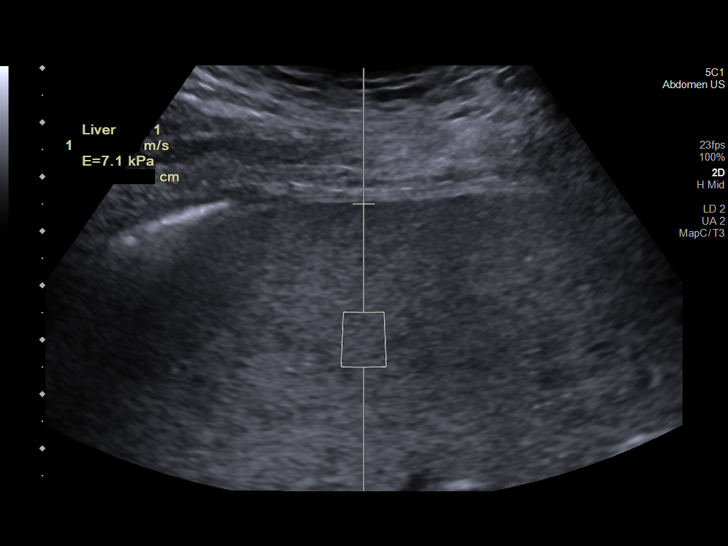
[im 16/19]
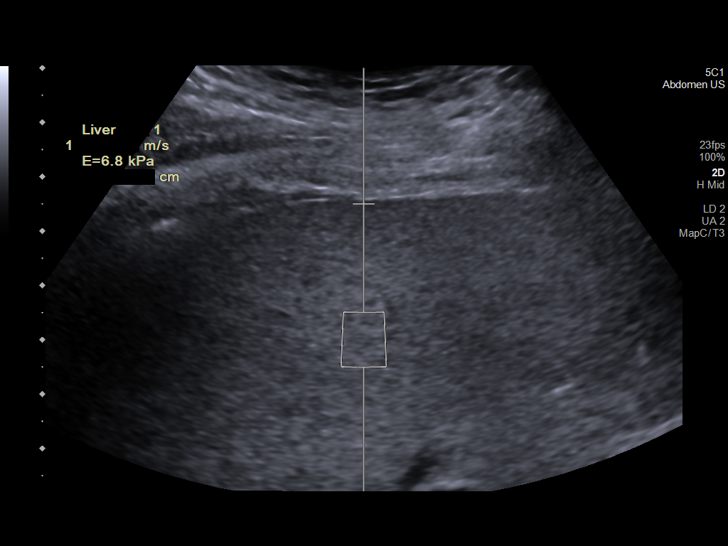
[im 17/19]
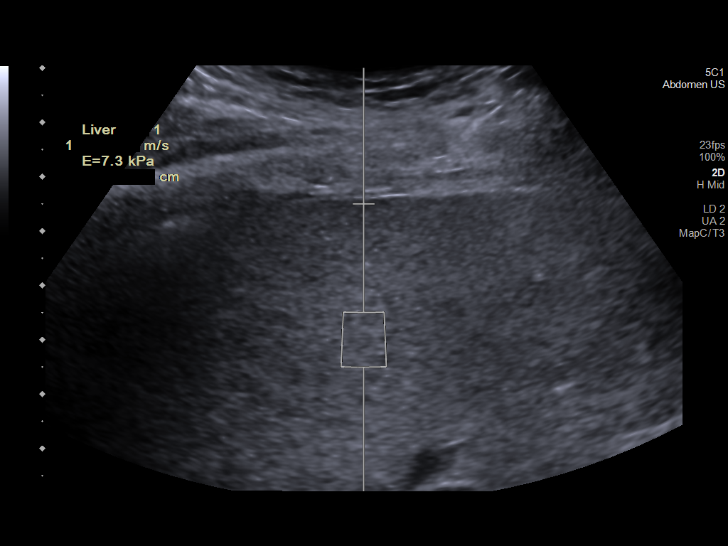
[im 19/19]
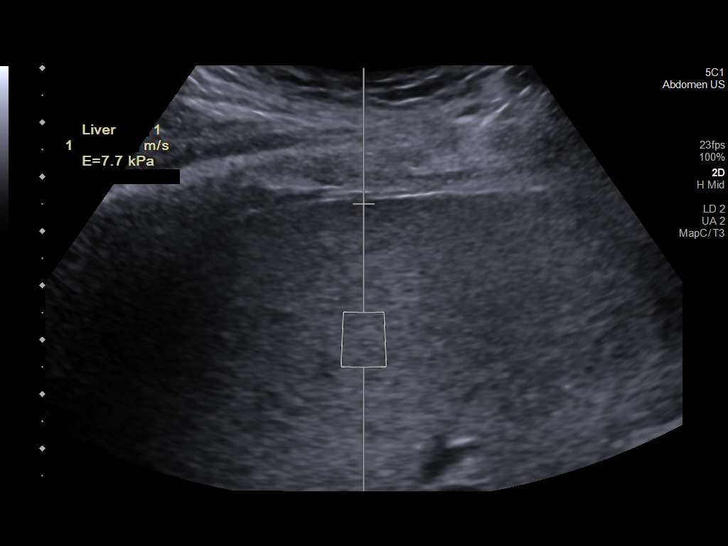

[12 of 19 positions shown; findings below may reference images not displayed]

FINDINGS: ULTRASOUND ABDOMEN

Gallbladder: Surgically absent.

Common bile duct: Diameter: 7 mm, within normal limits.

Liver: Diffusely increased echogenicity of the hepatic parenchyma,
consistent with hepatic steatosis. No hepatic mass identified.
Portal vein is patent on color Doppler imaging with normal direction
of blood flow towards the liver.

IVC: No abnormality visualized.

Pancreas: Visualized portion unremarkable.

Spleen: Mild splenomegaly, with calculated volume of 473 mL. No
splenic masses identified.

Right Kidney: Length: 12.7 cm. Echogenicity within normal limits. No
mass or hydronephrosis visualized.

Left Kidney: Length: 12.7 cm. Echogenicity within normal limits. No
mass or hydronephrosis visualized.

Abdominal aorta: No aneurysm visualized.

Other findings: No evidence of ascites.

ULTRASOUND HEPATIC ELASTOGRAPHY

Device: Siemens Helix VTQ

Patient position: Oblique

Transducer [id number]

Number of measurements: 10

Hepatic segment:  8

Median kPa:

IQR:

IQR/Median kPa ratio:

Data quality:  Good

Diagnostic category: < or = 9 kPa: in the absence of other known
clinical signs, rules out cACLD
IMPRESSION: ULTRASOUND ABDOMEN:

Diffuse hepatic steatosis.  No evidence of hepatic mass.

Mild splenomegaly.  No evidence of ascites.

Prior cholecystectomy.  No evidence of biliary ductal dilatation.

ULTRASOUND HEPATIC ELASTOGRAPHY:

Median kPa:

Diagnostic category: < or = 9 kPa: in the absence of other known
clinical signs, rules out cACLD

The use of hepatic elastography is applicable to patients with viral
hepatitis and non-alcoholic fatty liver disease. At this time, there
is insufficient data for the referenced cut-off values and use in
other causes of liver disease, including alcoholic liver disease.
Patients, however, may be assessed by elastography and serve as
their own reference standard/baseline.

In patients with non-alcoholic liver disease, the values suggesting
compensated advanced chronic liver disease (cACLD) may be lower, and
patients may need additional testing with elasticity results of [DATE]
kPa.

Please note that abnormal hepatic elasticity and shear wave
velocities may also be identified in clinical settings other than
with hepatic fibrosis, such as: acute hepatitis, elevated right
heart and central venous pressures including use of beta blockers,
Grimm disease (Canar[place]), infiltrative processes such as
mastocytosis/amyloidosis/infiltrative tumor/lymphoma, extrahepatic
cholestasis, with hyperemia in the post-prandial state, and with
liver transplantation. Correlation with patient history, laboratory
data, and clinical condition recommended.

Diagnostic Categories:

< or =5 kPa: high probability of being normal

< or =9 kPa: in the absence of other known clinical signs, rules [DATE] kPa and ?13 kPa: suggestive of cACLD, but needs further testing

>13 kPa: highly suggestive of cACLD

> or =17 kPa: highly suggestive of cACLD with an increased
probability of clinically significant portal hypertension

## 2020-09-25 ENCOUNTER — Ambulatory Visit: Payer: Medicare Other | Admitting: Internal Medicine

## 2020-10-19 ENCOUNTER — Ambulatory Visit: Payer: Medicare Other | Admitting: Internal Medicine

## 2020-10-19 ENCOUNTER — Encounter: Payer: Self-pay | Admitting: Internal Medicine

## 2020-10-19 ENCOUNTER — Other Ambulatory Visit: Payer: Self-pay

## 2020-10-19 VITALS — BP 144/73 | HR 83 | Temp 97.0°F | Ht 70.0 in | Wt 188.8 lb

## 2020-10-19 DIAGNOSIS — K2100 Gastro-esophageal reflux disease with esophagitis, without bleeding: Secondary | ICD-10-CM | POA: Diagnosis not present

## 2020-10-19 DIAGNOSIS — D509 Iron deficiency anemia, unspecified: Secondary | ICD-10-CM | POA: Diagnosis not present

## 2020-10-19 DIAGNOSIS — D696 Thrombocytopenia, unspecified: Secondary | ICD-10-CM | POA: Diagnosis not present

## 2020-10-19 DIAGNOSIS — K7469 Other cirrhosis of liver: Secondary | ICD-10-CM

## 2020-10-19 DIAGNOSIS — I8500 Esophageal varices without bleeding: Secondary | ICD-10-CM

## 2020-10-19 DIAGNOSIS — Z79899 Other long term (current) drug therapy: Secondary | ICD-10-CM

## 2020-10-19 DIAGNOSIS — Z1159 Encounter for screening for other viral diseases: Secondary | ICD-10-CM

## 2020-10-19 NOTE — Patient Instructions (Addendum)
Continue Protonix 40 mg daily  Hepatitis C antibody, hepatitis B surface antigen, hepatitis B antibody to surface antigen  Hepatitis A total antibody  Continue to abstain from alcohol.  We will plan to see you back in the office in 6 months  For liver labs including a hepatic function profile creatinine, CBC and INR  Liver ultrasound every 6 months  Office visit here in 6 months  Repeat EGD 1 year from now  Call if you have any interim symptoms.

## 2020-10-19 NOTE — Progress Notes (Addendum)
Primary Care Physician:  [name], MD Primary Gastroenterologist:  Dr.   Pre-Procedure History & Physical: HPI:  Derek Drake is a 75 y.o. male here for for iron deficiency anemia and thrombocytopenia/splenomegaly. Noninvasive fibrosis score indicates bridging fibrosis and cirrhosis.  This goes with the clinical picture.  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 Iron deficiency anemia secondary to slow GI blood loss likely from the stomach.  Is not had any overt bleeding certainly has had any melena or hematochezia hemoglobin is actually improved in the 11 range recently while taking iron.  He is also anticoagulated with Eliquis. History of yellow jaundice some 50 years ago. Aminotransferases are normal as is the INR He has done well since last EGD revealing grade 1 esophageal varices and possible early gave. Small bowel capsule study not done and not currently felt to be needed. He is due for screening hepatic ultrasound now.  History of GERD well-controlled on pantoprazole 40 mg daily.  Low meld  Past Medical History:  Diagnosis Date  . Essential hypertension   . GERD (gastroesophageal reflux disease)   . Hypercholesteremia   . PAF (paroxysmal atrial fibrillation) ([place]) 05/04/2014  . Type 2 diabetes mellitus ([place])     Past Surgical History:  Procedure Laterality Date  . BIOPSY  02/05/2017   Procedure: BIOPSY;  Surgeon: Daneil Dolin, MD;  [facility];  Service: Endoscopy;;  gastric colon  . CHOLECYSTECTOMY    . COLONOSCOPY N/A 05/13/2013   Procedure: COLONOSCOPY;  Surgeon: Daneil Dolin, MD;  [facility];  Service: Endoscopy;  Laterality: N/A;  8:30 AM  . COLONOSCOPY N/A 02/05/2017   Procedure: COLONOSCOPY;  Surgeon: Daneil Dolin, MD;  [facility];  Service: Endoscopy;  Laterality: N/A;  . ESOPHAGOGASTRODUODENOSCOPY N/A 02/05/2017   Procedure: ESOPHAGOGASTRODUODENOSCOPY (EGD);  Surgeon: Daneil Dolin, MD;  [facility];   Service: Endoscopy;  Laterality: N/A;  730 - moved to 12:00   . ESOPHAGOGASTRODUODENOSCOPY N/A 07/04/2020   Procedure: ESOPHAGOGASTRODUODENOSCOPY (EGD);  Surgeon: Daneil Dolin, MD;  [facility];  Service: Endoscopy;  Laterality: N/A;  12:45pm  . GIVENS CAPSULE STUDY N/A 07/04/2020   Procedure: GIVENS CAPSULE STUDY;  Surgeon: Daneil Dolin, MD;  [facility];  Service: Endoscopy;  Laterality: N/A;  Elected not to place a capsule today  . POLYPECTOMY  02/05/2017   Procedure: POLYPECTOMY;  Surgeon: Daneil Dolin, MD;  [facility];  Service: Endoscopy;;  colon    Prior to Admission medications   Medication Sig Start Date End Date Taking? Authorizing Provider  apixaban (ELIQUIS) 5 MG TABS tablet Take 1 tablet (5 mg total) by mouth 2 (two) times daily. 05/05/14  Yes [name], MD  Cinnamon 500 MG TABS Take 1,000 mg by mouth daily.    Yes [provider]  diltiazem (CARDIZEM CD) 360 MG 24 hr capsule Take 360 mg by mouth daily.    Yes [provider]  ferrous sulfate 325 (65 FE) MG EC tablet Take 325 mg by mouth in the morning and at bedtime.    Yes [provider]  fluocinonide cream (LIDEX) 4.25 % Apply 1 application topically daily as needed for rash. 05/30/20  Yes [provider]  gabapentin (NEURONTIN) 100 MG capsule Take 100 mg by mouth at bedtime. 07/07/19  Yes [provider]  glipiZIDE (GLUCOTROL) 5 MG tablet Take 5 mg by mouth daily before breakfast. 02/09/15  Yes [provider]  Krill Oil 350 MG CAPS Take 350 mg by mouth daily.   Yes [provider]  losartan (COZAAR) 100 MG tablet Take 100 mg by mouth daily.    Yes [provider]  metFORMIN (GLUCOPHAGE) 500 MG tablet Take 1,000 mg by mouth 2 (two) times daily with a meal.   Yes [provider]  Multiple Vitamin (MULTIVITAMIN WITH MINERALS) TABS tablet Take 1 tablet by mouth daily.   Yes [provider]   pantoprazole (PROTONIX) 40 MG tablet Take 1 tablet (40 mg total) by mouth daily. 08/03/20  Yes Shantavia Jha, Cristopher Estimable, MD  simvastatin (ZOCOR) 40 MG tablet Take 40 mg by mouth every evening.    Yes [provider]    Allergies as of 10/19/2020 - Review Complete 10/19/2020  Allergen Reaction Noted  . Rabbit protein Swelling 05/13/2013  . Hydrocodone-acetaminophen Rash 12/07/2015  . Other Swelling and Rash 05/13/2013  . Ivp dye [iodinated diagnostic agents] Other (See Comments) 01/30/2017    Family History  Problem Relation Age of Onset  . Prostate cancer Brother   . Congestive Heart Failure Mother   . Heart failure Brother   . Colon cancer Neg Hx     Social History   Socioeconomic History  . Marital status: Married    Spouse name: Not on file  . Number of children: Not on file  . Years of education: Not on file  . Highest education level: Not on file  Occupational History  . Not on file  Tobacco Use  . Smoking status: Former Smoker    Packs/day: 2.00    Years: 10.00    Pack years: 20.00    Types: Cigarettes    Start date: 05/25/1956    Quit date: 05/25/2009    Years since quitting: 11.4  . Smokeless tobacco: Current User    Types: Chew  Vaping Use  . Vaping Use: Never used  Substance and Sexual Activity  . Alcohol use: Not Currently    Alcohol/week: 0.0 standard drinks    Comment: Rare - 1 beer every 3 months  . Drug use: No  . Sexual activity: Not on file  Other Topics Concern  . Not on file  Social History Narrative  . Not on file   Social Determinants of Health   Financial Resource Strain: Not on file  Food Insecurity: Not on file  Transportation Needs: Not on file  Physical Activity: Not on file  Stress: Not on file  Social Connections: Not on file  Intimate Partner Violence: Not on file    Review of Systems: See HPI, otherwise negative ROS  Physical Exam: BP (!) 144/73   Pulse 83   Temp (!) 97 F (36.1 C)   Ht 5\' 10"  (1.778 m)   Wt 188  lb 12.8 oz (85.6 kg)   BMI 27.09 kg/m  General:   Alert,  pleasant and cooperative in NAD Skin:  Intact without significant lesions or rashes. Eyes:  Sclera clear, no icterus.   Conjunctiva pink. Neck:  Supple; no masses or thyromegaly. No significant cervical adenopathy. Lungs:  Clear throughout to auscultation.   No wheezes, crackles, or rhonchi. No acute distress. Heart:  Regular rate and rhythm; no murmurs, clicks, rubs,  or gallops. Abdomen: Non-distended, normal bowel sounds.  Soft and nontender without appreciable mass or hepatosplenomegaly.  Pulses:  Normal pulses noted. Extremities:  Without clubbing or edema.  Impression/Plan: 75 year old gentleman history of iron deficiency anemia secondary to, in part, likely slow  GI bleed from his stomach.  EGD and colonoscopy are up-to-date.  No evidence of H. pylori.  No significant findings on prior colonoscopy no future colonoscopy is recommended.  Patient likely has a combination of [place] and [name] related cirrhosis.  He did have grade 1 esophageal varices which warrant follow-up.  He is not on a nonselective beta-blocker he does take Cardizem for atrial fibrillation.  I think we can forego beta blockade for the time being  He does not need a limited screen for other causes of chronic liver disease as well as assess immune status for hepatitis a and B.  He may need to be vaccinated.  He gives a history of yellow jaundice decades ago.  I discussed the chronic and irreversible nature of cirrhosis.  Moving forward, we will be monitoring for potential complications and progression of liver disease.  I am glad he is no longer drinking.  GERD is well controlled on Protonix.  Recommendations:   Continue Protonix 40 mg daily  Hepatitis C antibody, hepatitis B surface antigen, hepatitis B antibody to surface antigen  Hepatitis A total antibody  Continue to abstain from alcohol.  We will plan to see you back in the office in 6  months  For liver labs including a hepatic function profile creatinine, CBC and INR  Liver ultrasound every 6 months  Office visit here in 6 months  Repeat EGD 1 year from now  Call if you have any interim symptoms.   Notice: This dictation was prepared with Dragon dictation along with smaller phrase technology. Any transcriptional errors that result from this process are unintentional and may not be corrected upon review.

## 2020-10-20 LAB — HEPATITIS A ANTIBODY, TOTAL: hep A Total Ab: POSITIVE — AB

## 2020-10-20 LAB — HEPATITIS B SURFACE ANTIBODY,QUALITATIVE: Hep B Surface Ab, Qual: NONREACTIVE

## 2020-10-20 LAB — HEPATITIS C ANTIBODY: Hep C Virus Ab: <0.1 s/co ratio (ref 0.0–0.9)

## 2020-10-20 LAB — HEPATITIS B SURFACE ANTIGEN: Hepatitis B Surface Ag: NEGATIVE

## 2020-12-28 ENCOUNTER — Telehealth: Payer: Self-pay | Admitting: Cardiology

## 2020-12-28 DIAGNOSIS — R0602 Shortness of breath: Secondary | ICD-10-CM

## 2020-12-28 DIAGNOSIS — I4891 Unspecified atrial fibrillation: Secondary | ICD-10-CM

## 2020-12-28 NOTE — Telephone Encounter (Signed)
Patient is c/o dizziness, bloated stomach, and SOB. Patient also states that he feels as though his heart skips a beat every now and then. Patient only feels sob when he is up moving. Patient's weight currently stays between 180-185 lb. Patients spouse stated patient denies constipation. Please advise.

## 2020-12-28 NOTE — Telephone Encounter (Signed)
I have not seen him in quite a while, last note was per Mr. Leonides Sake NP last year. Please schedule an office visit in [place] with me or APP to better assess. Go ahead and get a follow-up echocardiogram before visit to reevaluate LVEF due to shortness of breath and known atrial fibrillation.

## 2020-12-28 NOTE — Telephone Encounter (Signed)
  1. Are you currently SOB (can you hear that pt is SOB on the phone)? No (spoke with wife)   2. How long have you been experiencing SOB? One week ago - comes and goes   3. Are you SOB when sitting or when up moving around?  yes  4. Are you currently experiencing any other symptoms? Feels like his stomach is bloating

## 2020-12-31 NOTE — Addendum Note (Signed)
Addended by: Christella Scheuermann C on: 12/31/2020 08:02 AM   Modules accepted: Orders

## 2020-12-31 NOTE — Telephone Encounter (Signed)
Patient to have Echo before office visit with Bernerd Pho, PA-C on 01/15/2021 at 1 pm at [facility] office.

## 2021-01-15 ENCOUNTER — Ambulatory Visit: Payer: Medicare Other | Admitting: Student

## 2021-01-25 ENCOUNTER — Ambulatory Visit (HOSPITAL_COMMUNITY)
Admission: RE | Admit: 2021-01-25 | Discharge: 2021-01-25 | Disposition: A | Discharge status: Home / Self Care | Payer: Medicare Other | Source: Ambulatory Visit | Attending: Student | Admitting: Student

## 2021-01-25 ENCOUNTER — Other Ambulatory Visit: Payer: Self-pay

## 2021-01-25 DIAGNOSIS — R0602 Shortness of breath: Secondary | ICD-10-CM

## 2021-01-25 DIAGNOSIS — I4891 Unspecified atrial fibrillation: Secondary | ICD-10-CM

## 2021-01-25 LAB — ECHOCARDIOGRAM COMPLETE
Area-P 1/2: 3.5 cm2
S' Lateral: 3.3 cm

## 2021-01-25 NOTE — Progress Notes (Signed)
  Echocardiogram 2D Echocardiogram has been performed.  Derek Drake 01/25/2021, 2:46 PM

## 2021-02-06 ENCOUNTER — Other Ambulatory Visit: Payer: Self-pay

## 2021-02-06 ENCOUNTER — Ambulatory Visit: Payer: Medicare Other | Admitting: Student

## 2021-02-06 ENCOUNTER — Encounter: Payer: Self-pay | Admitting: Student

## 2021-02-06 VITALS — BP 140/66 | HR 84 | Ht 71.0 in | Wt 187.8 lb

## 2021-02-06 DIAGNOSIS — I480 Paroxysmal atrial fibrillation: Secondary | ICD-10-CM | POA: Diagnosis not present

## 2021-02-06 DIAGNOSIS — R0600 Dyspnea, unspecified: Secondary | ICD-10-CM | POA: Diagnosis not present

## 2021-02-06 DIAGNOSIS — I10 Essential (primary) hypertension: Secondary | ICD-10-CM

## 2021-02-06 DIAGNOSIS — E782 Mixed hyperlipidemia: Secondary | ICD-10-CM

## 2021-02-06 DIAGNOSIS — R0609 Other forms of dyspnea: Secondary | ICD-10-CM

## 2021-02-06 NOTE — Progress Notes (Signed)
Cardiology Office Note    Date:  02/06/2021   ID:  Antwaine, Boomhower 11-02-1945, MRN [id number]  PCP:  [name], MD  Cardiologist: [name], MD    Chief Complaint  Patient presents with  . Follow-up    Dyspnea on Exertion    History of Present Illness:    MEHAR KIRKWOOD is a 74 y.o. male with past medical history of paroxysmal atrial fibrillation, HTN, HLD and Type II DM who presents to the office today for evaluation of worsening dyspnea on exertion.  He was last examined by Katina Dung, NP in 02/2020 and denied any recent chest pain or palpitations at that time. He was continued on his current medication regimen including Cardizem CD 240 mg daily and Eliquis 5 mg twice daily for anticoagulation.  He called the office last month reporting dizziness, abdominal distention and dyspnea along with occasional palpitations. It was recommended that he have a repeat echocardiogram and a follow-up visit be arranged for further evaluation. His echocardiogram showed hyperdynamic LV function with EF at 70 to 75% and mild LVH. RV function was normal and PASP was normal. He did have trivial MR but no significant valve abnormalities.  In talking with the patient and his wife today, he reports he was previously experiencing more significant abdominal distention but this has occurred intermittently over the past several years. He has been followed by GI and was switched to Protonix with improvement in his symptoms. He feels like Metformin could be contributing as this has caused some diarrhea and has also influenced his appetite.  He previously experienced some intermittent dyspnea on exertion but reports this has improved since he has been more active given the warmer temperatures. Feels it was possibly due to deconditioning. Denies any associated chest pain. No recent orthopnea, PND or pitting edema. He does experience occasional palpitations but says they typically only last for a few  seconds at a time.   Past Medical History:  Diagnosis Date  . Essential hypertension   . GERD (gastroesophageal reflux disease)   . Hypercholesteremia   . PAF (paroxysmal atrial fibrillation) ([place]) 05/04/2014  . Type 2 diabetes mellitus ([place])     Past Surgical History:  Procedure Laterality Date  . BIOPSY  02/05/2017   Procedure: BIOPSY;  Surgeon: Daneil Dolin, MD;  [facility];  Service: Endoscopy;;  gastric colon  . CHOLECYSTECTOMY    . COLONOSCOPY N/A 05/13/2013   Procedure: COLONOSCOPY;  Surgeon: Daneil Dolin, MD;  [facility];  Service: Endoscopy;  Laterality: N/A;  8:30 AM  . COLONOSCOPY N/A 02/05/2017   Procedure: COLONOSCOPY;  Surgeon: Daneil Dolin, MD;  [facility];  Service: Endoscopy;  Laterality: N/A;  . ESOPHAGOGASTRODUODENOSCOPY N/A 02/05/2017   Procedure: ESOPHAGOGASTRODUODENOSCOPY (EGD);  Surgeon: Daneil Dolin, MD;  [facility];  Service: Endoscopy;  Laterality: N/A;  730 - moved to 12:00   . ESOPHAGOGASTRODUODENOSCOPY N/A 07/04/2020   Procedure: ESOPHAGOGASTRODUODENOSCOPY (EGD);  Surgeon: Daneil Dolin, MD;  [facility];  Service: Endoscopy;  Laterality: N/A;  12:45pm  . GIVENS CAPSULE STUDY N/A 07/04/2020   Procedure: GIVENS CAPSULE STUDY;  Surgeon: Daneil Dolin, MD;  [facility];  Service: Endoscopy;  Laterality: N/A;  Elected not to place a capsule today  . POLYPECTOMY  02/05/2017   Procedure: POLYPECTOMY;  Surgeon: Daneil Dolin, MD;  [facility];  Service: Endoscopy;;  colon    Current  Medications: Outpatient Medications Prior to Visit  Medication Sig Dispense Refill  . apixaban (ELIQUIS) 5 MG TABS tablet Take 1 tablet (5 mg total) by mouth 2 (two) times daily. 60 tablet 0  . diltiazem (CARDIZEM CD) 360 MG 24 hr capsule Take 360 mg by mouth daily.     . ferrous sulfate 325 (65 FE) MG EC tablet Take 325 mg by mouth in the morning and at bedtime.     .  fluocinonide cream (LIDEX) 2.56 % Apply 1 application topically daily as needed for rash.    . gabapentin (NEURONTIN) 100 MG capsule Take 100 mg by mouth at bedtime.    Marland Kitchen glipiZIDE (GLUCOTROL) 5 MG tablet Take 5 mg by mouth daily before breakfast.    . Krill Oil 350 MG CAPS Take 350 mg by mouth daily.    Marland Kitchen losartan (COZAAR) 100 MG tablet Take 100 mg by mouth daily.     . metFORMIN (GLUCOPHAGE) 500 MG tablet Take 1,000 mg by mouth 2 (two) times daily with a meal.    . Multiple Vitamin (MULTIVITAMIN WITH MINERALS) TABS tablet Take 1 tablet by mouth daily.    . pantoprazole (PROTONIX) 40 MG tablet Take 1 tablet (40 mg total) by mouth daily. 30 tablet 11  . simvastatin (ZOCOR) 40 MG tablet Take 40 mg by mouth every evening.     . Cinnamon 500 MG TABS Take 1,000 mg by mouth daily.  (Patient not taking: Reported on 02/06/2021)     No facility-administered medications prior to visit.     Allergies:   Rabbit protein, Hydrocodone-acetaminophen, Other, and Ivp dye [iodinated diagnostic agents]   Social History   Socioeconomic History  . Marital status: Married    Spouse name: Not on file  . Number of children: Not on file  . Years of education: Not on file  . Highest education level: Not on file  Occupational History  . Not on file  Tobacco Use  . Smoking status: Former Smoker    Packs/day: 2.00    Years: 10.00    Pack years: 20.00    Types: Cigarettes    Start date: 05/25/1956    Quit date: 05/25/2009    Years since quitting: 11.7  . Smokeless tobacco: Current User    Types: Chew  Vaping Use  . Vaping Use: Never used  Substance and Sexual Activity  . Alcohol use: Not Currently    Alcohol/week: 0.0 standard drinks    Comment: Rare - 1 beer every 3 months  . Drug use: No  . Sexual activity: Not on file  Other Topics Concern  . Not on file  Social History Narrative  . Not on file   Social Determinants of Health   Financial Resource Strain: Not on file  Food Insecurity: Not on  file  Transportation Needs: Not on file  Physical Activity: Not on file  Stress: Not on file  Social Connections: Not on file     Family History:  The patient's family history includes Congestive Heart Failure in his mother; Heart failure in his brother; Prostate cancer in his brother.   Review of Systems:    Please see the history of present illness.     All other systems reviewed and are otherwise negative except as noted above.   Physical Exam:    VS:  BP 140/66   Pulse 84   Ht 5\' 11"  (1.803 m)   Wt 187 lb 12.8 oz (85.2 kg)   SpO2 98%  BMI 26.19 kg/m    General: Well developed, well nourished,male appearing in no acute distress. Head: Normocephalic, atraumatic. Neck: No carotid bruits. JVD not elevated.  Lungs: Respirations regular and unlabored, without wheezes or rales.  Heart: Regular rate and rhythm. No S3 or S4.  No murmur, no rubs, or gallops appreciated. Abdomen: Appears non-distended. No obvious abdominal masses. Msk:  Strength and tone appear normal for age. No obvious joint deformities or effusions. Extremities: No clubbing or cyanosis. No lower extremity edema.  Distal pedal pulses are 2+ bilaterally. Neuro: Alert and oriented X 3. Moves all extremities spontaneously. No focal deficits noted. Psych:  Responds to questions appropriately with a normal affect. Skin: No rashes or lesions noted  Wt Readings from Last 3 Encounters:  02/06/21 187 lb 12.8 oz (85.2 kg)  10/19/20 188 lb 12.8 oz (85.6 kg)  08/03/20 192 lb 12.8 oz (87.5 kg)     Studies/Labs Reviewed:   EKG:  EKG is ordered today. The ekg ordered today demonstrates NSR, HR 82 with no acute ST changes when compared to prior tracings.   Recent Labs: 08/03/2020: ALT 37; BUN 10; Creatinine, Ser 0.99; Hemoglobin 13.1; Platelets 115; Potassium 4.7; Sodium 140   Lipid Panel No results found for: CHOL, TRIG, HDL, CHOLHDL, VLDL, LDLCALC, LDLDIRECT  Additional studies/ records that were reviewed today  include:   Echocardiogram: 01/25/2021 IMPRESSIONS    1. Left ventricular ejection fraction, by estimation, is 70 to 75%. The  left ventricle has hyperdynamic function. The left ventricle has no  regional wall motion abnormalities. There is mild left ventricular  hypertrophy. Left ventricular diastolic  parameters are indeterminate.  2. Right ventricular systolic function is normal. The right ventricular  size is normal. There is normal pulmonary artery systolic pressure. The  estimated right ventricular systolic pressure is 29.5 mmHg.  3. The mitral valve is grossly normal. Trivial mitral valve  regurgitation.  4. The aortic valve is tricuspid. Aortic valve regurgitation is not  visualized.  5. The inferior vena cava is normal in size with greater than 50%  respiratory variability, suggesting right atrial pressure of 3 mmHg.   Assessment:    1. Dyspnea on exertion   2. PAF (paroxysmal atrial fibrillation) ([place])   3. Essential hypertension   4. Mixed hyperlipidemia      Plan:   In order of problems listed above:  1. Dyspnea on Exertion - Symptoms were previously worse in the cooler months and have actually improved over the past month as he has increased his activity. No associated chest pain. He does not appear volume overloaded and I suspect his several year history of intermittent abdominal distension is secondary to a GI etiology as this is triggered with food consumption and improves with belching. I did encourage him to reach out to his PCP in regards to his intolerance to Metformin as this could possibly be changed to extended-release.  - Given the improvement in his symptoms and recent reassuring echocardiogram, will not pursue further testing at this time. I encouraged him to reach out if he has recurrent symptoms as a stress test could be arranged to assess for an ischemic etiology given his cardiac risk factors.   2. Paroxysmal Atrial Fibrillation - He does  experience occasional, brief palpitations but symptoms typically only last a few seconds. Will continue Cardizem CD 360mg  daily for rate-control and Eliquis 5mg  BID for anticoagulation. Will request most recent labs from PCP.   3. HTN - BP is at 140/66 during today's visit.  Continue current regimen with Cardizem CD 360mg  daily and Losartan 100mg  daily.   4. HLD - Followed by his PCP. LDL was at 83 in 2021. Will request most recent labs. He remains on Simvastatin 40mg  daily (has been on this dose for several years and tolerated well).   Medication Adjustments/Labs and Tests Ordered: Current medicines are reviewed at length with the patient today.  Concerns regarding medicines are outlined above.  Medication changes, Labs and Tests ordered today are listed in the Patient Instructions below. Patient Instructions  Medication Instructions:  Your physician recommends that you continue on your current medications as directed. Please refer to the Current Medication list given to you today.  *If you need a refill on your cardiac medications before your next appointment, please call your pharmacy*   Lab Work: NONE   If you have labs (blood work) drawn today and your tests are completely normal, you will receive your results only by: Marland Kitchen MyChart Message (if you have MyChart) OR . A paper copy in the mail If you have any lab test that is abnormal or we need to change your treatment, we will call you to review the results.   Testing/Procedures: NONE    Follow-Up: At [facility], you and your health needs are our priority.  As part of our continuing mission to provide you with exceptional heart care, we have created designated Provider Care Teams.  These Care Teams include your primary Cardiologist (physician) and Advanced Practice Providers (APPs -  Physician Assistants and Nurse Practitioners) who all work together to provide you with the care you need, when you need it.  We recommend signing  up for the patient portal called "MyChart".  Sign up information is provided on this After Visit Summary.  MyChart is used to connect with patients for Virtual Visits (Telemedicine).  Patients are able to view lab/test results, encounter notes, upcoming appointments, etc.  Non-urgent messages can be sent to your provider as well.   To learn more about what you can do with MyChart, go to [email or web address].    Your next appointment:   6 month(s)  The format for your next appointment:   In Person  Provider:   [name], MD   Other Instructions Thank you for choosing [facility]!       Signed, Erma Heritage, PA-C  02/06/2021 7:46 PM    [facility] [place] [address] [place], [place] [address] Phone: [phone] Fax: [phone]

## 2021-02-06 NOTE — Patient Instructions (Signed)
Medication Instructions:  Your physician recommends that you continue on your current medications as directed. Please refer to the Current Medication list given to you today.  *If you need a refill on your cardiac medications before your next appointment, please call your pharmacy*   Lab Work: NONE   If you have labs (blood work) drawn today and your tests are completely normal, you will receive your results only by: . MyChart Message (if you have MyChart) OR . A paper copy in the mail If you have any lab test that is abnormal or we need to change your treatment, we will call you to review the results.   Testing/Procedures: NONE    Follow-Up: At [facility], you and your health needs are our priority.  As part of our continuing mission to provide you with exceptional heart care, we have created designated Provider Care Teams.  These Care Teams include your primary Cardiologist (physician) and Advanced Practice Providers (APPs -  Physician Assistants and Nurse Practitioners) who all work together to provide you with the care you need, when you need it.  We recommend signing up for the patient portal called "MyChart".  Sign up information is provided on this After Visit Summary.  MyChart is used to connect with patients for Virtual Visits (Telemedicine).  Patients are able to view lab/test results, encounter notes, upcoming appointments, etc.  Non-urgent messages can be sent to your provider as well.   To learn more about what you can do with MyChart, go to [email or web address].    Your next appointment:   6 month(s)  The format for your next appointment:   In Person  Provider:   Samuel McDowell, MD   Other Instructions Thank you for choosing [facility]!    

## 2021-04-04 ENCOUNTER — Ambulatory Visit: Payer: Medicare Other | Admitting: Cardiology

## 2021-04-11 ENCOUNTER — Encounter: Payer: Self-pay | Admitting: Internal Medicine

## 2021-04-11 ENCOUNTER — Telehealth: Payer: Self-pay | Admitting: Internal Medicine

## 2021-04-11 NOTE — Telephone Encounter (Signed)
RECALL FOR ULTRASOUND 

## 2021-04-11 NOTE — Telephone Encounter (Signed)
Letter mailed

## 2021-04-16 ENCOUNTER — Telehealth: Payer: Self-pay | Admitting: Internal Medicine

## 2021-04-16 DIAGNOSIS — K7469 Other cirrhosis of liver: Secondary | ICD-10-CM

## 2021-04-16 NOTE — Addendum Note (Signed)
Addended by: Cheron Every on: 04/16/2021 12:13 PM   Modules accepted: Orders

## 2021-04-16 NOTE — Telephone Encounter (Signed)
Abd [place] scheduled for 8/10 at 8:30am, arrival 8:15am, npo midnight.   Called pt and aware of appt details. He voiced understanding.

## 2021-04-16 NOTE — Telephone Encounter (Signed)
Patient received letter to schedule ultrasound  

## 2021-04-17 ENCOUNTER — Other Ambulatory Visit: Payer: Self-pay

## 2021-04-17 ENCOUNTER — Encounter: Payer: Self-pay | Admitting: Internal Medicine

## 2021-04-17 ENCOUNTER — Ambulatory Visit: Payer: Medicare Other | Admitting: Internal Medicine

## 2021-04-17 VITALS — BP 146/77 | HR 77 | Temp 96.6°F | Ht 71.0 in | Wt 184.8 lb

## 2021-04-17 DIAGNOSIS — K7469 Other cirrhosis of liver: Secondary | ICD-10-CM | POA: Diagnosis not present

## 2021-04-17 DIAGNOSIS — K2100 Gastro-esophageal reflux disease with esophagitis, without bleeding: Secondary | ICD-10-CM

## 2021-04-17 DIAGNOSIS — I8510 Secondary esophageal varices without bleeding: Secondary | ICD-10-CM

## 2021-04-17 NOTE — Patient Instructions (Signed)
It was good to see you again today.  No change in your medication regimen  Will continue to check your liver every 6 months with ultrasound  We will plan to see you back in the office in 6 months and have updated labs done at that time  We will plan for repeat upper endoscopy in about 12 to 18 months from now to look at those varicose veins once again.  If you have any interim problems that arise, please do not hesitate to call us.

## 2021-04-17 NOTE — Progress Notes (Signed)
Primary Care Physician:  [name], MD Primary Gastroenterologist:  Dr. Gala Romney  Pre-Procedure History & Physical: HPI:  Derek Drake is a 75 y.o. male here for follow-up of ASH/Nash cirrhosis.  He has done well history of GERD with a initiation of Protonix 40 mg daily has been associated with essentially resolution of his dyspepsia/reflux symptoms.  No dysphagia.  He is very happy.  He is due for hepatic screening ultrasound.  Likely slow GI bleed from stomach.  He is maintained on oral iron supplementation.  Has not had any melena or rectal bleeding.  H&H normal from last November platelet count 115,000.  He has received his hepatitis B vaccination.  He is immune to hepatitis A. He continues to be anticoagulated on Eliquis for atrial fibrillation.  Past Medical History:  Diagnosis Date   Essential hypertension    GERD (gastroesophageal reflux disease)    Hypercholesteremia    PAF (paroxysmal atrial fibrillation) ([place]) 05/04/2014   Type 2 diabetes mellitus [facility])     Past Surgical History:  Procedure Laterality Date   BIOPSY  02/05/2017   Procedure: BIOPSY;  Surgeon: Daneil Dolin, MD;  [facility];  Service: Endoscopy;;  gastric colon   CHOLECYSTECTOMY     COLONOSCOPY N/A 05/13/2013   Procedure: COLONOSCOPY;  Surgeon: Daneil Dolin, MD;  [facility];  Service: Endoscopy;  Laterality: N/A;  8:30 AM   COLONOSCOPY N/A 02/05/2017   Procedure: COLONOSCOPY;  Surgeon: Daneil Dolin, MD;  [facility];  Service: Endoscopy;  Laterality: N/A;   ESOPHAGOGASTRODUODENOSCOPY N/A 02/05/2017   Procedure: ESOPHAGOGASTRODUODENOSCOPY (EGD);  Surgeon: Daneil Dolin, MD;  [facility];  Service: Endoscopy;  Laterality: N/A;  730 - moved to 12:00    ESOPHAGOGASTRODUODENOSCOPY N/A 07/04/2020   Procedure: ESOPHAGOGASTRODUODENOSCOPY (EGD);  Surgeon: Daneil Dolin, MD;  [facility];  Service: Endoscopy;  Laterality: N/A;  12:45pm   GIVENS  CAPSULE STUDY N/A 07/04/2020   Procedure: GIVENS CAPSULE STUDY;  Surgeon: Daneil Dolin, MD;  [facility];  Service: Endoscopy;  Laterality: N/A;  Elected not to place a capsule today   POLYPECTOMY  02/05/2017   Procedure: POLYPECTOMY;  Surgeon: Daneil Dolin, MD;  [facility];  Service: Endoscopy;;  colon    Prior to Admission medications   Medication Sig Start Date End Date Taking? Authorizing Provider  apixaban (ELIQUIS) 5 MG TABS tablet Take 1 tablet (5 mg total) by mouth 2 (two) times daily. 05/05/14  Yes [name], MD  diltiazem (CARDIZEM CD) 360 MG 24 hr capsule Take 360 mg by mouth daily.    Yes [provider]  ferrous sulfate 325 (65 FE) MG EC tablet Take 325 mg by mouth in the morning and at bedtime.    Yes [provider]  fluocinonide cream (LIDEX) [id number] % Apply 1 application topically daily as needed for rash. 05/30/20  Yes [provider]  gabapentin (NEURONTIN) 100 MG capsule Take 100 mg by mouth at bedtime. 07/07/19  Yes [provider]  glipiZIDE (GLUCOTROL) 5 MG tablet Take 5 mg by mouth daily before breakfast. 02/09/15  Yes [provider]  [name] 350 MG CAPS Take 350 mg by mouth daily.   Yes [provider]  losartan (COZAAR) 100 MG tablet Take 100 mg by mouth daily.    Yes [provider]  metFORMIN (GLUCOPHAGE) 500 MG tablet Take 1,000 mg by mouth 2 (two)  times daily with a meal.   Yes [provider]  Multiple Vitamin (MULTIVITAMIN WITH MINERALS) TABS tablet Take 1 tablet by mouth daily.   Yes [provider]  pantoprazole (PROTONIX) 40 MG tablet Take 1 tablet (40 mg total) by mouth daily. 08/03/20  Yes Yuki Purves, Cristopher Estimable, MD  simvastatin (ZOCOR) 40 MG tablet Take 40 mg by mouth every evening.    Yes [provider]  Cinnamon 500 MG TABS Take 1,000 mg by mouth daily.  Patient not taking: No sig reported    [provider]    Allergies as of  04/17/2021 - Review Complete 04/17/2021  Allergen Reaction Noted   Rabbit protein Swelling 05/13/2013   Hydrocodone-acetaminophen Rash 12/07/2015   Other Swelling and Rash 05/13/2013   Ivp dye [iodinated diagnostic agents] Other (See Comments) 01/30/2017    Family History  Problem Relation Age of Onset   Prostate cancer Brother    Congestive Heart Failure Mother    Heart failure Brother    Colon cancer Neg Hx     Social History   Socioeconomic History   Marital status: Married    Spouse name: Not on file   Number of children: Not on file   Years of education: Not on file   Highest education level: Not on file  Occupational History   Not on file  Tobacco Use   Smoking status: Former    Packs/day: 2.00    Years: 10.00    Pack years: 20.00    Types: Cigarettes    Start date: 05/25/1956    Quit date: 05/25/2009    Years since quitting: 11.9   Smokeless tobacco: Current    Types: Chew  Vaping Use   Vaping Use: Never used  Substance and Sexual Activity   Alcohol use: Not Currently    Alcohol/week: 0.0 standard drinks    Comment: Rare - 1 beer every 3 months   Drug use: No   Sexual activity: Not on file  Other Topics Concern   Not on file  Social History Narrative   Not on file   Social Determinants of Health   Financial Resource Strain: Not on file  Food Insecurity: Not on file  Transportation Needs: Not on file  Physical Activity: Not on file  Stress: Not on file  Social Connections: Not on file  Intimate Partner Violence: Not on file    Review of Systems: See HPI, otherwise negative ROS  Physical Exam: BP (!) 146/77   Pulse 77   Temp (!) 96.6 F (35.9 C) (Temporal)   Ht '5\' 11"'$  (1.803 m)   Wt 184 lb 12.8 oz (83.8 kg)   BMI 25.77 kg/m  General:   Alert,  Well-developed, well-nourished, pleasant and cooperative in NAD Neck:  Supple; no masses or thyromegaly. No significant cervical adenopathy. Lungs:  Clear throughout to auscultation.   No wheezes,  crackles, or rhonchi. No acute distress. Heart:  Regular rate and rhythm; no murmurs, clicks, rubs,  or gallops. Abdomen: Non-distended, normal bowel sounds.  Soft and nontender without appreciable mass or hepatosplenomegaly.  Pulses:  Normal pulses noted. Extremities:  Without clubbing or edema.  Impression/Plan: 75 year old gentleman with ASHNash/Nash cirrhosis remains well compensated.  Likely slow GI bleed from stomach given otherwise negative pan endoscopy evaluation recently. He will need updated labs later this year. He is now been vaccinated against hepatitis B.  Recommendations:  No change in your medication regimen  Will continue to check liver every 6 months with ultrasound  We will plan to see him back in the office in 6 months and have updated labs done at that time  We will plan for repeat upper endoscopy in about 12 to 18 months from now to look at those varicose veins once again.  If any interim problems that arise, please do not hesitate to call us.        Notice: This dictation was prepared with Dragon dictation along with smaller phrase technology. Any transcriptional errors that result from this process are unintentional and may not be corrected upon review.

## 2021-04-24 ENCOUNTER — Ambulatory Visit (HOSPITAL_COMMUNITY)
Admission: RE | Admit: 2021-04-24 | Discharge: 2021-04-24 | Disposition: A | Discharge status: Home / Self Care | Payer: Medicare Other | Source: Ambulatory Visit | Attending: Internal Medicine | Admitting: Internal Medicine

## 2021-04-24 ENCOUNTER — Other Ambulatory Visit: Payer: Self-pay

## 2021-04-24 DIAGNOSIS — K7469 Other cirrhosis of liver: Secondary | ICD-10-CM | POA: Insufficient documentation

## 2021-04-29 ENCOUNTER — Telehealth: Payer: Self-pay | Admitting: Internal Medicine

## 2021-04-29 NOTE — Telephone Encounter (Signed)
Pt returning call regarding his results. [phone]

## 2021-04-29 NOTE — Telephone Encounter (Signed)
Lmom requesting pt to return my call.

## 2021-04-29 NOTE — Telephone Encounter (Signed)
Spoke with pt and informed him of results. Pt verbalized understanding.

## 2021-06-20 ENCOUNTER — Other Ambulatory Visit: Payer: Self-pay | Admitting: Internal Medicine

## 2021-08-16 ENCOUNTER — Ambulatory Visit: Payer: Medicare Other | Admitting: Cardiology

## 2021-10-12 IMAGING — US US ABDOMEN COMPLETE
1 series · 13 of 25 positions shown · non-contrast
Comparison: Prior ultrasound 03/30/2020.

CLINICAL DATA: Liver cirrhosis follow-up. History of
cholecystectomy, hypertension and diabetes.

EXAM:
ABDOMEN ULTRASOUND COMPLETE

[Series 1: us abdomen complete · 13 of 105 slices shown]
[im 1/105]
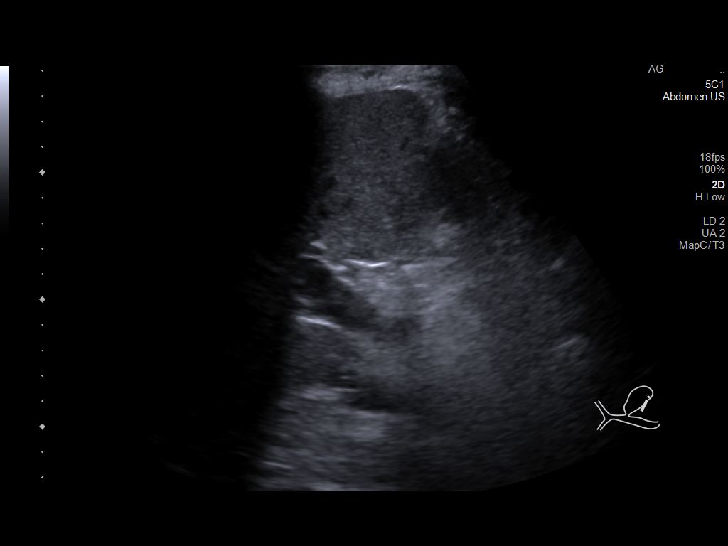
[im 9/105]
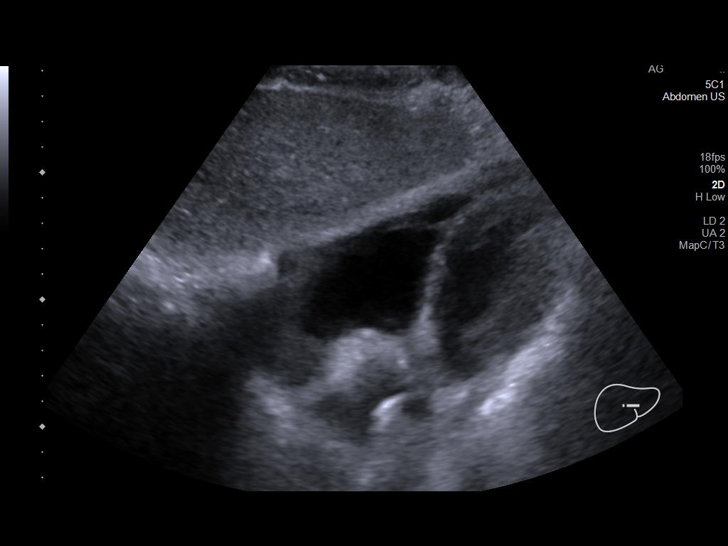
[im 18/105]
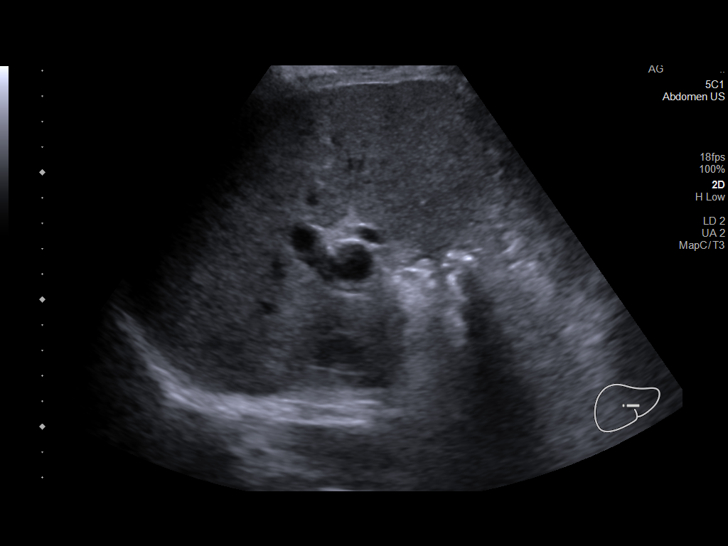
[im 27/105]
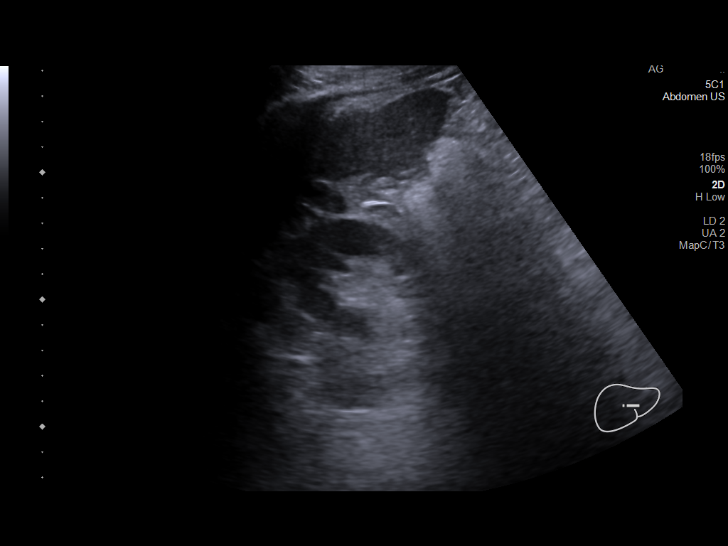
[im 35/105]
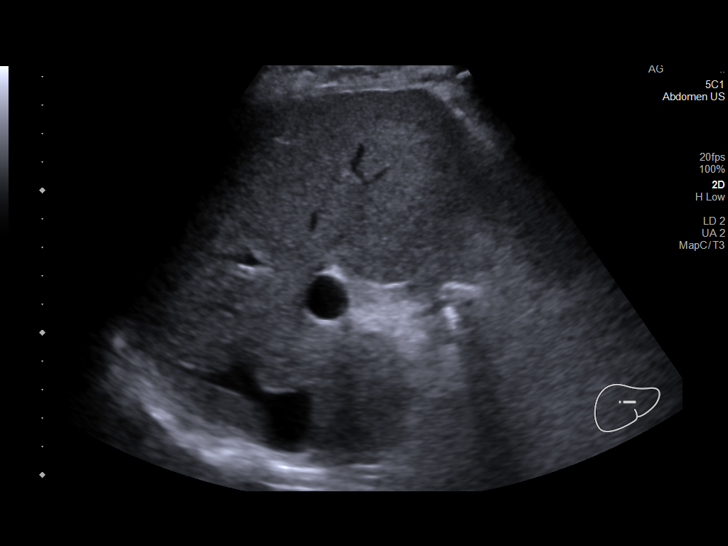
[im 44/105]
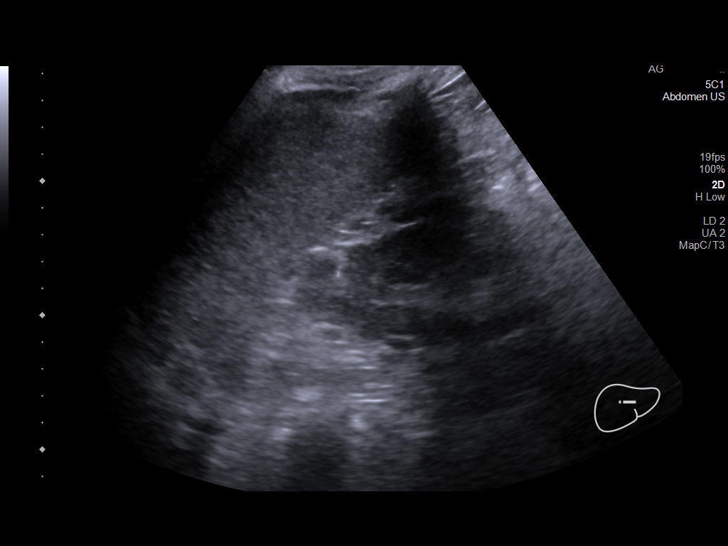
[im 53/105]
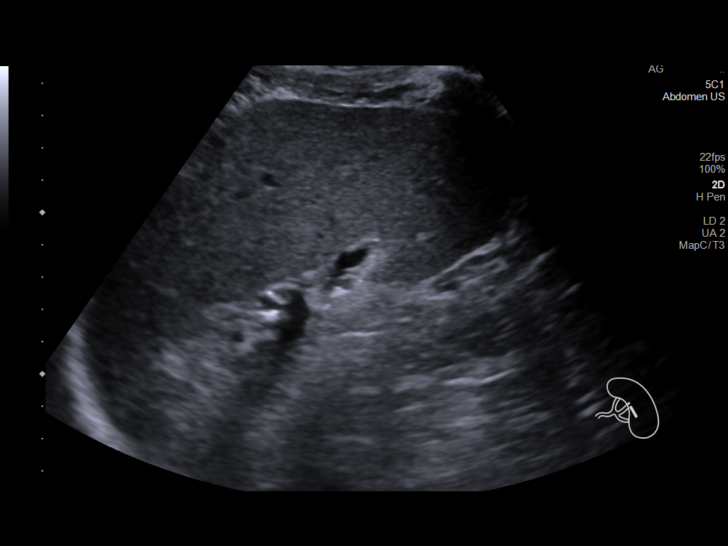
[im 61/105]
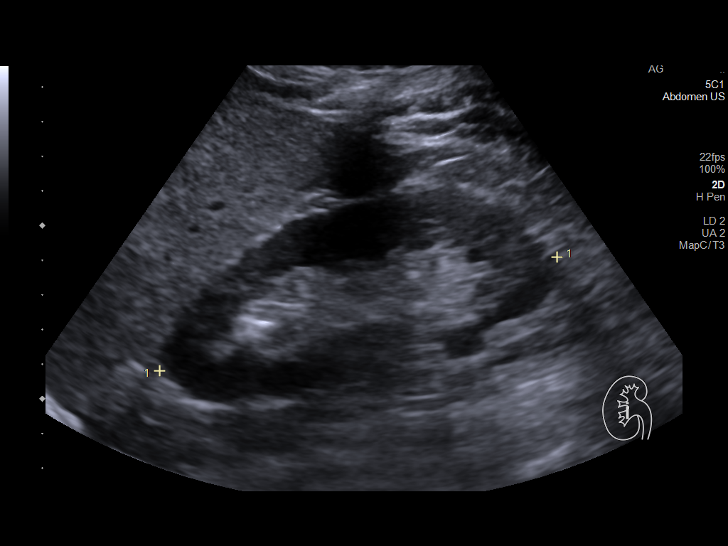
[im 70/105]
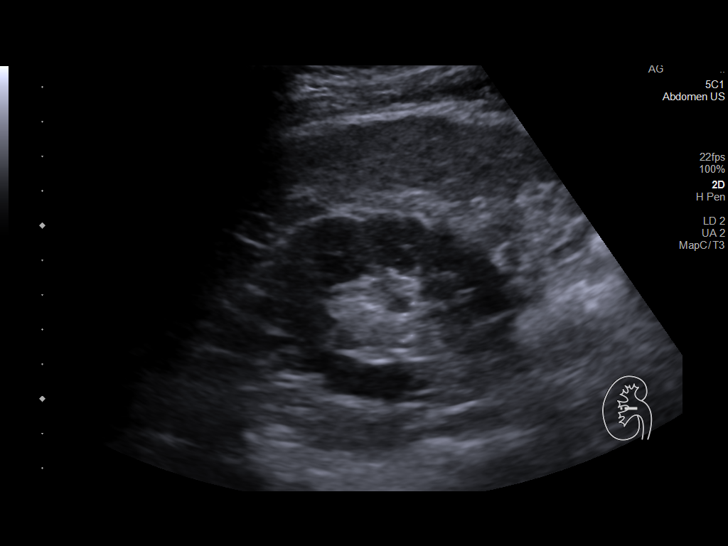
[im 79/105]
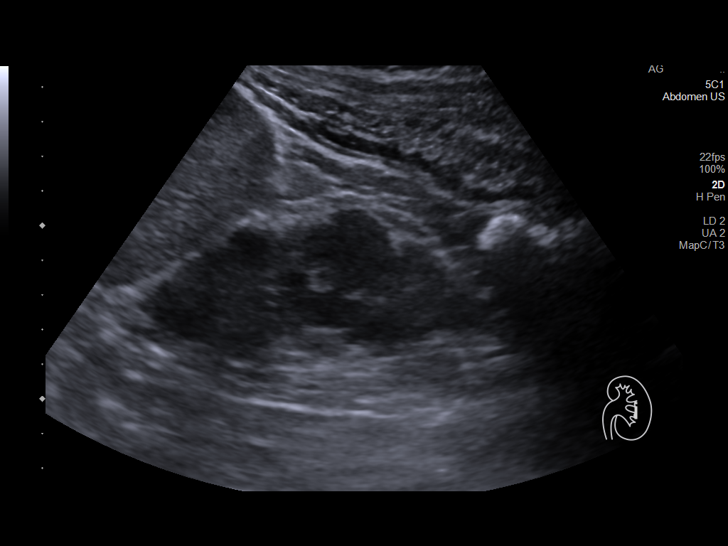
[im 87/105]
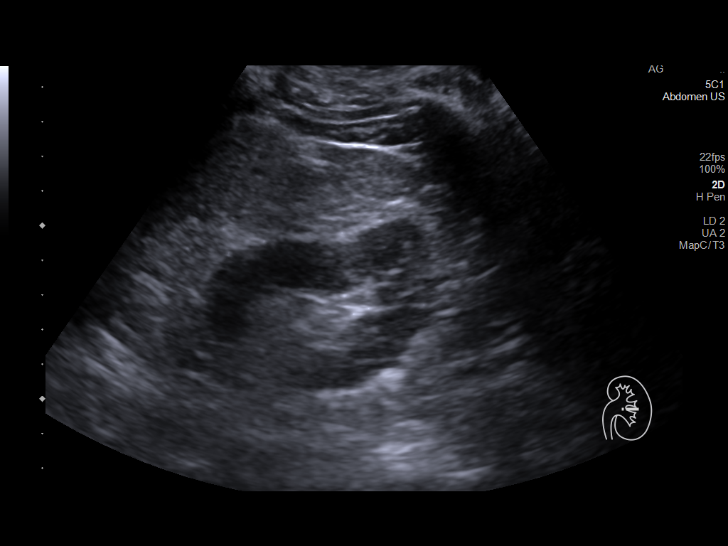
[im 96/105]
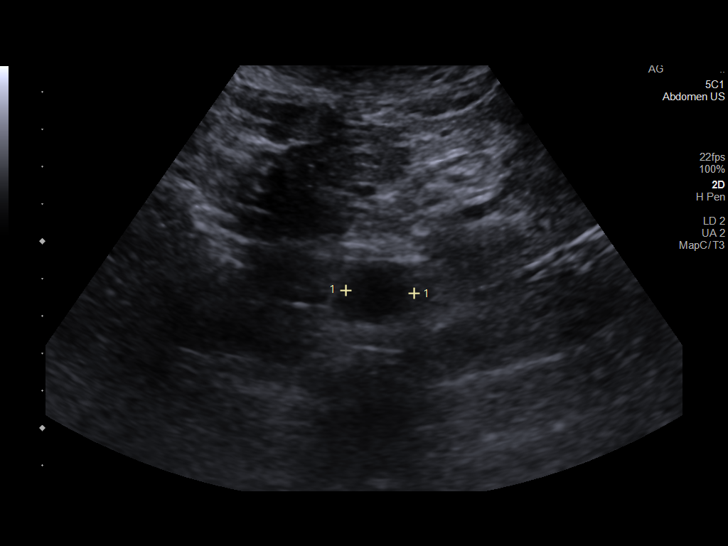
[im 105/105]
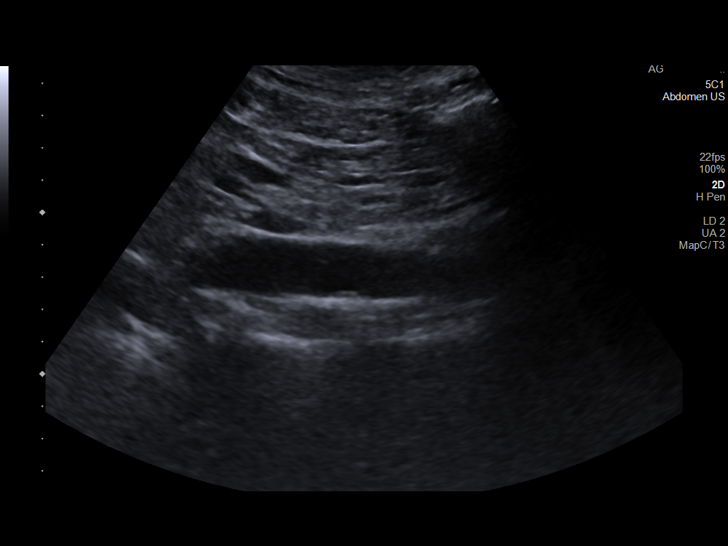

[13 of 25 positions shown; findings below may reference images not displayed]

FINDINGS: Gallbladder: Status post cholecystectomy.

Common bile duct: Diameter: 5 mm

Liver: The hepatic echogenicity is increased, and there is mild
contour irregularity of the liver, in keeping with cirrhosis. No
focal hepatic lesion identified. Portal vein is patent on color
Doppler imaging with normal direction of blood flow towards the
liver.

IVC: No abnormality visualized.  Partially obscured by bowel gas.

Pancreas: The visualized portions appear unremarkable. The
pancreatic tail is obscured by bowel gas.

Spleen: Mild splenomegaly. Splenic volume is estimated at 698 cc. No
focal abnormality.

Right Kidney: Length: 11.9 cm. Echogenicity within normal limits. No
mass or hydronephrosis visualized.

Left Kidney: Length: 11.7 cm. Echogenicity within normal limits. No
mass or hydronephrosis visualized.

Abdominal aorta: Mild atherosclerosis without evidence of aneurysm.

Other findings: No ascites.
IMPRESSION: 1. Stable appearance of the liver with increased echogenicity and
mild contour irregularity. No focal abnormality identified.
2. No biliary dilatation.
3. Mildly increased splenomegaly compared with previous study.

## 2021-10-15 ENCOUNTER — Encounter: Payer: Self-pay | Admitting: Internal Medicine

## 2021-11-18 ENCOUNTER — Encounter: Payer: Self-pay | Admitting: Internal Medicine

## 2021-11-18 ENCOUNTER — Telehealth: Payer: Self-pay | Admitting: Internal Medicine

## 2021-11-18 NOTE — Progress Notes (Signed)
? ? ?Cardiology Office Note ? ?Date: 11/19/2021  ? ?ID: ZYIERE ROSEMOND, DOB April 29, 1946, MRN [id number] ? ?PCP:  [name], MD  ?Cardiologist:  [name], MD ?Electrophysiologist:  None  ? ?Chief Complaint  ?Patient presents with  ? Cardiac follow-up  ? ? ?History of Present Illness: ?Derek Drake is a 76 y.o. male last seen in May 2022.  He is here for a routine visit.  Reports no progressive sense of palpitations, no exertional chest pain.  He has been enjoying the warmer weather as far as getting outdoors more. ? ?CHA2DS2-VASc score is 4.  I reviewed his medications which are noted below.  He does not report any spontaneous bleeding problems on Eliquis.  Requesting recent lab work from [place].  He remains on Cardizem CD as well. ? ?Past Medical History:  ?Diagnosis Date  ? Essential hypertension   ? GERD (gastroesophageal reflux disease)   ? Hypercholesteremia   ? PAF (paroxysmal atrial fibrillation) ([place]) 05/04/2014  ? Type 2 diabetes mellitus ([place])   ? ? ?Past Surgical History:  ?Procedure Laterality Date  ? BIOPSY  02/05/2017  ? Procedure: BIOPSY;  Surgeon: Daneil Dolin, MD;  [facility];  Service: Endoscopy;;  gastric ?colon  ? CHOLECYSTECTOMY    ? COLONOSCOPY N/A 05/13/2013  ? Procedure: COLONOSCOPY;  Surgeon: Daneil Dolin, MD;  [facility];  Service: Endoscopy;  Laterality: N/A;  8:30 AM  ? COLONOSCOPY N/A 02/05/2017  ? Procedure: COLONOSCOPY;  Surgeon: Daneil Dolin, MD;  [facility];  Service: Endoscopy;  Laterality: N/A;  ? ESOPHAGOGASTRODUODENOSCOPY N/A 02/05/2017  ? Procedure: ESOPHAGOGASTRODUODENOSCOPY (EGD);  Surgeon: Daneil Dolin, MD;  [facility];  Service: Endoscopy;  Laterality: N/A;  730 - moved to 12:00 ?  ? ESOPHAGOGASTRODUODENOSCOPY N/A 07/04/2020  ? Procedure: ESOPHAGOGASTRODUODENOSCOPY (EGD);  Surgeon: Daneil Dolin, MD;  [facility];  Service: Endoscopy;  Laterality: N/A;  12:45pm  ? GIVENS CAPSULE STUDY N/A  07/04/2020  ? Procedure: GIVENS CAPSULE STUDY;  Surgeon: Daneil Dolin, MD;  [facility];  Service: Endoscopy;  Laterality: N/A;  Elected not to place a capsule today  ? POLYPECTOMY  02/05/2017  ? Procedure: POLYPECTOMY;  Surgeon: Daneil Dolin, MD;  [facility];  Service: Endoscopy;;  colon  ? ? ?Current Outpatient Medications  ?Medication Sig Dispense Refill  ? apixaban (ELIQUIS) 5 MG TABS tablet Take 1 tablet (5 mg total) by mouth 2 (two) times daily. 60 tablet 0  ? diltiazem (CARDIZEM CD) 360 MG 24 hr capsule Take 360 mg by mouth daily.     ? ferrous sulfate 325 (65 FE) MG EC tablet Take 325 mg by mouth in the morning and at bedtime.     ? fluocinonide cream (LIDEX) 4.76 % Apply 1 application topically daily as needed for rash.    ? gabapentin (NEURONTIN) 100 MG capsule Take 100 mg by mouth at bedtime.    ? Krill Oil 350 MG CAPS Take 350 mg by mouth daily.    ? losartan (COZAAR) 100 MG tablet Take 100 mg by mouth daily.     ? metFORMIN (GLUCOPHAGE) 500 MG tablet Take 1,000 mg by mouth 2 (two) times daily with a meal.    ? Multiple Vitamin (MULTIVITAMIN WITH MINERALS) TABS tablet Take 1 tablet by mouth daily.    ? pantoprazole (PROTONIX) 40 MG tablet TAKE 1 TABLET BY MOUTH EVERY DAY 90 tablet 3  ? simvastatin (  ZOCOR) 40 MG tablet Take 40 mg by mouth every evening.     ? Cinnamon 500 MG TABS Take 1,000 mg by mouth daily.  (Patient not taking: Reported on 02/06/2021)    ? glipiZIDE (GLUCOTROL) 5 MG tablet Take 5 mg by mouth daily before breakfast. (Patient not taking: Reported on 11/19/2021)    ? ?No current facility-administered medications for this visit.  ? ?Allergies:  Rabbit protein, Hydrocodone-acetaminophen, Other, and Ivp dye [iodinated contrast media]  ? ?ROS: No orthopnea or PND.  No syncope. ? ?Physical Exam: ?VS:  BP 120/70   Pulse 79   Ht '5\' 11"'$  (1.803 m)   Wt 184 lb 12.8 oz (83.8 kg)   SpO2 98%   BMI 25.77 kg/m? , BMI Body mass index is 25.77 kg/m?. ? ?Wt Readings from  Last 3 Encounters:  ?11/19/21 184 lb 12.8 oz (83.8 kg)  ?04/17/21 184 lb 12.8 oz (83.8 kg)  ?02/06/21 187 lb 12.8 oz (85.2 kg)  ?  ?General: Patient appears comfortable at rest. ?HEENT: Conjunctiva and lids normal, wearing a mask. ?Neck: Supple, no elevated JVP or carotid bruits, no thyromegaly. ?Lungs: Clear to auscultation, nonlabored breathing at rest. ?Cardiac: Irregularly irregular, no S3 or significant systolic murmur, no pericardial rub. ?Extremities: No pitting edema. ? ?ECG:  An ECG dated 02/06/2021 was personally reviewed today and demonstrated:  Sinus rhythm. ? ?Recent Labwork: ? ?No interval lab work for review today. ? ?Other Studies Reviewed Today: ? ?Echocardiogram 01/25/2021: ? 1. Left ventricular ejection fraction, by estimation, is 70 to 75%. The  ?left ventricle has hyperdynamic function. The left ventricle has no  ?regional wall motion abnormalities. There is mild left ventricular  ?hypertrophy. Left ventricular diastolic  ?parameters are indeterminate.  ? 2. Right ventricular systolic function is normal. The right ventricular  ?size is normal. There is normal pulmonary artery systolic pressure. The  ?estimated right ventricular systolic pressure is 06.2 mmHg.  ? 3. The mitral valve is grossly normal. Trivial mitral valve  ?regurgitation.  ? 4. The aortic valve is tricuspid. Aortic valve regurgitation is not  ?visualized.  ? 5. The inferior vena cava is normal in size with greater than 50%  ?respiratory variability, suggesting right atrial pressure of 3 mmHg.  ? ?Assessment and Plan: ? ?1.  Paroxysmal atrial fibrillation with CHA2DS2-VASc score of 4.  He is symptomatically stable on Cardizem CD and remains on Eliquis for stroke prophylaxis.  No reported bleeding problems.  We will obtain his most recent lab work from [place]. ? ?2.  Essential hypertension, blood pressure well controlled today.  He is also on Cozaar.  No changes were made. ? ?Medication Adjustments/Labs and Tests Ordered: ?Current  medicines are reviewed at length with the patient today.  Concerns regarding medicines are outlined above.  ? ?Tests Ordered: ?No orders of the defined types were placed in this encounter. ? ? ?Medication Changes: ?No orders of the defined types were placed in this encounter. ? ? ?Disposition:  Follow up  6 months. ? ?Signed, ?Satira Sark, MD, [facility] ?11/19/2021 9:59 AM    ?[facility] ?[address], [place], [place] [address] ?Phone: [phone]; Fax: [phone]  ?

## 2021-11-18 NOTE — Telephone Encounter (Signed)
Recall for ultrasound 

## 2021-11-19 ENCOUNTER — Encounter: Payer: Self-pay | Admitting: *Deleted

## 2021-11-19 ENCOUNTER — Other Ambulatory Visit: Payer: Self-pay

## 2021-11-19 ENCOUNTER — Encounter: Payer: Self-pay | Admitting: Cardiology

## 2021-11-19 ENCOUNTER — Ambulatory Visit (INDEPENDENT_AMBULATORY_CARE_PROVIDER_SITE_OTHER): Payer: Medicare Other | Admitting: Cardiology

## 2021-11-19 VITALS — BP 120/70 | HR 79 | Ht 71.0 in | Wt 184.8 lb

## 2021-11-19 DIAGNOSIS — I10 Essential (primary) hypertension: Secondary | ICD-10-CM | POA: Diagnosis not present

## 2021-11-19 DIAGNOSIS — I480 Paroxysmal atrial fibrillation: Secondary | ICD-10-CM | POA: Diagnosis not present

## 2021-11-19 NOTE — Patient Instructions (Signed)
Medication Instructions:  ?Your physician recommends that you continue on your current medications as directed. Please refer to the Current Medication list given to you today. ? ?*If you need a refill on your cardiac medications before your next appointment, please call your pharmacy* ? ? ?Lab Work: ?NONE  ? ?If you have labs (blood work) drawn today and your tests are completely normal, you will receive your results only by: ?MyChart Message (if you have MyChart) OR ?A paper copy in the mail ?If you have any lab test that is abnormal or we need to change your treatment, we will call you to review the results. ? ? ?Testing/Procedures: ?NONE  ? ? ?Follow-Up: ?At [facility], you and your health needs are our priority.  As part of our continuing mission to provide you with exceptional heart care, we have created designated Provider Care Teams.  These Care Teams include your primary Cardiologist (physician) and Advanced Practice Providers (APPs -  Physician Assistants and Nurse Practitioners) who all work together to provide you with the care you need, when you need it. ? ?We recommend signing up for the patient portal called "MyChart".  Sign up information is provided on this After Visit Summary.  MyChart is used to connect with patients for Virtual Visits (Telemedicine).  Patients are able to view lab/test results, encounter notes, upcoming appointments, etc.  Non-urgent messages can be sent to your provider as well.   ?To learn more about what you can do with MyChart, go to [email or web address].   ? ?Your next appointment:   ?6 month(s) ? ?The format for your next appointment:   ?In Person ? ?Provider:   ?[name], MD  ? ? ?Other Instructions ? ?You have been given samples of Eliquis today Lot# [id number], Exp # Nov/ 2024 ?Thank you for choosing [facility]! ?  ? ?

## 2021-11-22 ENCOUNTER — Telehealth: Payer: Self-pay | Admitting: Internal Medicine

## 2021-11-25 NOTE — Telephone Encounter (Signed)
Error

## 2021-12-03 ENCOUNTER — Ambulatory Visit (INDEPENDENT_AMBULATORY_CARE_PROVIDER_SITE_OTHER): Payer: Medicare Other | Admitting: Internal Medicine

## 2021-12-03 ENCOUNTER — Other Ambulatory Visit: Payer: Self-pay

## 2021-12-03 ENCOUNTER — Encounter: Payer: Self-pay | Admitting: Internal Medicine

## 2021-12-03 VITALS — BP 140/60 | HR 63 | Temp 97.5°F | Ht 71.0 in | Wt 185.4 lb

## 2021-12-03 DIAGNOSIS — K2100 Gastro-esophageal reflux disease with esophagitis, without bleeding: Secondary | ICD-10-CM | POA: Diagnosis not present

## 2021-12-03 DIAGNOSIS — K7469 Other cirrhosis of liver: Secondary | ICD-10-CM | POA: Diagnosis not present

## 2021-12-03 NOTE — Progress Notes (Signed)
? ? ?Primary Care Physician:  [name], MD ?Primary Gastroenterologist:  Dr. Gala Romney ? ?Pre-Procedure History & Physical: ?HPI:  Derek Drake is a 76 y.o. male here for follow-up of GERD and NASH/Nash cirrhosis.  He has done well.  Protonix 40 mg daily has abolished his reflux symptoms.  No dysphagia. ?History of a nodular liver consistent with cirrhosis but low FibroSure score.  Historically extremely well compensated.  Grade 1 esophageal varices 2 years ago.  No NSSB at this time ?Marland Kitchen ?Negative colonoscopy a couple of years ago without future examination recommended.  He is due for repeat EGD. ? ?He continues on Eliquis for atrial fibrillation. ? ?Past Medical History:  ?Diagnosis Date  ? Essential hypertension   ? GERD (gastroesophageal reflux disease)   ? Hypercholesteremia   ? PAF (paroxysmal atrial fibrillation) ([place]) 05/04/2014  ? Type 2 diabetes mellitus ([place])   ? ? ?Past Surgical History:  ?Procedure Laterality Date  ? BIOPSY  02/05/2017  ? Procedure: BIOPSY;  Surgeon: Daneil Dolin, MD;  [facility];  Service: Endoscopy;;  gastric ?colon  ? CHOLECYSTECTOMY    ? COLONOSCOPY N/A 05/13/2013  ? Procedure: COLONOSCOPY;  Surgeon: Daneil Dolin, MD;  [facility];  Service: Endoscopy;  Laterality: N/A;  8:30 AM  ? COLONOSCOPY N/A 02/05/2017  ? Procedure: COLONOSCOPY;  Surgeon: Daneil Dolin, MD;  [facility];  Service: Endoscopy;  Laterality: N/A;  ? ESOPHAGOGASTRODUODENOSCOPY N/A 02/05/2017  ? Procedure: ESOPHAGOGASTRODUODENOSCOPY (EGD);  Surgeon: Daneil Dolin, MD;  [facility];  Service: Endoscopy;  Laterality: N/A;  730 - moved to 12:00 ?  ? ESOPHAGOGASTRODUODENOSCOPY N/A 07/04/2020  ? Procedure: ESOPHAGOGASTRODUODENOSCOPY (EGD);  Surgeon: Daneil Dolin, MD;  [facility];  Service: Endoscopy;  Laterality: N/A;  12:45pm  ? GIVENS CAPSULE STUDY N/A 07/04/2020  ? Procedure: GIVENS CAPSULE STUDY;  Surgeon: Daneil Dolin, MD;  [facility];  Service: Endoscopy;  Laterality: N/A;  Elected not to place a capsule today  ? POLYPECTOMY  02/05/2017  ? Procedure: POLYPECTOMY;  Surgeon: Daneil Dolin, MD;  [facility];  Service: Endoscopy;;  colon  ? ? ?Prior to Admission medications   ?Medication Sig Start Date End Date Taking? Authorizing Provider  ?apixaban (ELIQUIS) 5 MG TABS tablet Take 1 tablet (5 mg total) by mouth 2 (two) times daily. 05/05/14  Yes [name], MD  ?diltiazem (CARDIZEM CD) 360 MG 24 hr capsule Take 360 mg by mouth daily.    Yes [provider]  ?ferrous sulfate 325 (65 FE) MG EC tablet Take 325 mg by mouth in the morning and at bedtime.    Yes [provider]  ?fluocinonide cream (LIDEX) 8.67 % Apply 1 application topically daily as needed for rash. 05/30/20  Yes [provider]  ?gabapentin (NEURONTIN) 100 MG capsule Take 100 mg by mouth at bedtime. 07/07/19  Yes [provider]  ?Astrid Drafts 350 MG CAPS Take 350 mg by mouth daily.   Yes [provider]  ?losartan (COZAAR) 100 MG tablet Take 100 mg by mouth daily.    Yes [provider]  ?metFORMIN (GLUCOPHAGE) 500 MG tablet Take 1,000 mg by mouth 2 (two) times daily with a meal.   Yes [provider]  ?Multiple Vitamin (MULTIVITAMIN WITH MINERALS) TABS tablet Take 1 tablet by mouth daily.   Yes [provider]  ?pantoprazole (PROTONIX) 40 MG tablet TAKE 1 TABLET BY MOUTH  EVERY DAY 06/21/21  Yes Mahala Menghini, PA-C  ?simvastatin (ZOCOR) 40 MG tablet Take 40 mg by mouth every evening.    Yes [provider]  ?Cinnamon 500 MG TABS Take 1,000 mg by mouth daily.  ?Patient not taking: Reported on 02/06/2021    [provider]  ?glipiZIDE (GLUCOTROL) 5 MG tablet Take 5 mg by mouth daily before breakfast. ?Patient not taking: Reported on 11/19/2021 02/09/15   [provider]  ? ? ?Allergies as of 12/03/2021 - Review Complete 12/03/2021  ?Allergen Reaction Noted  ? Rabbit  protein Swelling 05/13/2013  ? Hydrocodone-acetaminophen Rash 12/07/2015  ? Other Swelling and Rash 05/13/2013  ? Ivp dye [iodinated contrast media] Other (See Comments) 01/30/2017  ? ? ?Family History  ?Problem Relation Age of Onset  ? Prostate cancer Brother   ? Congestive Heart Failure Mother   ? Heart failure Brother   ? Colon cancer Neg Hx   ? ? ?Social History  ? ?Socioeconomic History  ? Marital status: Married  ?  Spouse name: Not on file  ? Number of children: Not on file  ? Years of education: Not on file  ? Highest education level: Not on file  ?Occupational History  ? Not on file  ?Tobacco Use  ? Smoking status: Former  ?  Packs/day: 2.00  ?  Years: 10.00  ?  Pack years: 20.00  ?  Types: Cigarettes  ?  Start date: 05/25/1956  ?  Quit date: 05/25/2009  ?  Years since quitting: 12.5  ? Smokeless tobacco: Current  ?  Types: Chew  ?Vaping Use  ? Vaping Use: Never used  ?Substance and Sexual Activity  ? Alcohol use: Not Currently  ?  Alcohol/week: 0.0 standard drinks  ?  Comment: Rare - 1 beer every 3 months  ? Drug use: No  ? Sexual activity: Not Currently  ?Other Topics Concern  ? Not on file  ?Social History Narrative  ? Not on file  ? ?Social Determinants of Health  ? ?Financial Resource Strain: Not on file  ?Food Insecurity: Not on file  ?Transportation Needs: Not on file  ?Physical Activity: Not on file  ?Stress: Not on file  ?Social Connections: Not on file  ?Intimate Partner Violence: Not on file  ? ? ?Review of Systems: ?See HPI, otherwise negative ROS ? ?Physical Exam: ?BP 140/60 (BP Location: Right Arm, Patient Position: Sitting, Cuff Size: Normal)   Pulse 63   Temp (!) 97.5 ?F (36.4 ?C) (Temporal)   Ht '5\' 11"'$  (1.803 m)   Wt 185 lb 6.4 oz (84.1 kg)   SpO2 93%   BMI 25.86 kg/m?  ?General:   Alert,   pleasant and cooperative in NAD ?SNeck:  Supple; no masses or thyromegaly. No significant cervical adenopathy. ?Lungs:  Clear throughout to auscultation.   No wheezes, crackles, or rhonchi. No acute  distress. ?Heart:  Regular rate and rhythm; no murmurs, clicks, rubs,  or gallops. ?Abdomen: Non-distended, normal bowel sounds.  Soft and nontender without appreciable mass or hepatosplenomegaly.  ?Pulses:  Normal pulses noted. ?Extremities:  Without clubbing or edema. ? ?Impression/Plan: 76 year old gentleman with a history of Nash/Nash cirrhosis.  Remains well compensated.  Platelet count chronically in the low 100,000 range. ?Chronically anticoagulated for atrial fibrillation. ? ?Fibro sure/elastography looked remarkably better than expected for patient he clinically has a diagnosis of cirrhosis.  He does have hepatic nodularity, and esophageal varices along with chronic thrombocytopenia ?Which gives him a diagnosis of cirrhosis. ? ?He  needs an updated EGD to reassess varices. ? ?Recommendations: ? ?We will schedule an EGD in the near future.  ASA 3. V The risks, benefits, limitations, alternatives and imponderables have been reviewed with the patient. Potential for esophageal dilation, biopsy, etc. have also been reviewed.  Questions have been answered. All parties agreeable.  ? ?Stop taking Eliquis 2 days prior to the procedure ? ?We will continue to check a liver ultrasound every 6 months ? ?Further recommendations to follow. ? ? ? ?Notice: This dictation was prepared with Dragon dictation along with smaller phrase technology. Any transcriptional errors that result from this process are unintentional and may not be corrected upon review.  ?

## 2021-12-03 NOTE — Patient Instructions (Signed)
It was good seeing you again today! ? ?As discussed, its time to recheck your esophagus. ? ?We will schedule an EGD in the near future.  ASA 3. ? ?Stop taking your Eliquis 2 days prior to the procedure ? ?We will continue to check a liver ultrasound every 6 months ? ?Further recommendations to follow. ?

## 2021-12-18 ENCOUNTER — Encounter: Payer: Self-pay | Admitting: *Deleted

## 2021-12-18 ENCOUNTER — Telehealth: Payer: Self-pay | Admitting: *Deleted

## 2021-12-18 NOTE — Telephone Encounter (Signed)
Called pt. Spoke with spouse. He has been scheduled for EGD ASA 3 Dr. Gala Romney on 5/11 at [place] will mail instructions, pre-op appt  ?

## 2022-01-20 NOTE — Patient Instructions (Signed)
? ? ? ? ? ? [facility] ? 01/20/2022  ?  ? '@PREFPERIOPPHARMACY'$ @ ? ? Your procedure is scheduled on  01/23/2022. ? ? Report to [facility] at  0930  A.M. ? ? Call this number if you have problems the morning of surgery: ? [phone] ? ? Remember: ? Follow the diet and prep instructions given to you by the office. ?  ? Take these medicines the morning of surgery with A SIP OF WATER   ? ?                                    diltiazem, protonix. ?  ? ? Do not wear jewelry, make-up or nail polish. ? Do not wear lotions, powders, or perfumes, or deodorant. ? Do not shave 48 hours prior to surgery.  Men may shave face and neck. ? Do not bring valuables to the hospital. ? [facility] is not responsible for any belongings or valuables. ? ?Contacts, dentures or bridgework may not be worn into surgery.  Leave your suitcase in the car.  After surgery it may be brought to your room. ? ?For patients admitted to the hospital, discharge time will be determined by your treatment team. ? ?Patients discharged the day of surgery will not be allowed to drive home and must  have someone with them for 24 hours.  ? ? ?Special instructions:   DO NOT smoke tobacco or vape for 24 hours before your procedure. ? ?Please read over the following fact sheets that you were given. ?Anesthesia Post-op Instructions and Care and Recovery After Surgery ?  ? ? ? Upper Endoscopy, Adult, Care After ?This sheet gives you information about how to care for yourself after your procedure. Your health care provider may also give you more specific instructions. If you have problems or questions, contact your health care provider. ?What can I expect after the procedure? ?After the procedure, it is common to have: ?A sore throat. ?Mild stomach pain or discomfort. ?Bloating. ?Nausea. ?Follow these instructions at home: ? ?Follow instructions from your health care provider about what to eat or drink after your procedure. ?Return to your normal activities as told by  your health care provider. Ask your health care provider what activities are safe for you. ?Take over-the-counter and prescription medicines only as told by your health care provider. ?If you were given a sedative during the procedure, it can affect you for several hours. Do not drive or operate machinery until your health care provider says that it is safe. ?Keep all follow-up visits as told by your health care provider. This is important. ?Contact a health care provider if you have: ?A sore throat that lasts longer than one day. ?Trouble swallowing. ?Get help right away if: ?You vomit blood or your vomit looks like coffee grounds. ?You have: ?A fever. ?Bloody, black, or tarry stools. ?A severe sore throat or you cannot swallow. ?Difficulty breathing. ?Severe pain in your chest or abdomen. ?Summary ?After the procedure, it is common to have a sore throat, mild stomach discomfort, bloating, and nausea. ?If you were given a sedative during the procedure, it can affect you for several hours. Do not drive or operate machinery until your health care provider says that it is safe. ?Follow instructions from your health care provider about what to eat or drink after your procedure. ?Return to your normal activities as told by your health  care provider. ?This information is not intended to replace advice given to you by your health care provider. Make sure you discuss any questions you have with your health care provider. ?Document Revised: 07/08/2019 Document Reviewed: 02/01/2018 ?Elsevier Patient Education ? [place]. ?Monitored Anesthesia Care, Care After ?This sheet gives you information about how to care for yourself after your procedure. Your health care provider may also give you more specific instructions. If you have problems or questions, contact your health care provider. ?What can I expect after the procedure? ?After the procedure, it is common to have: ?Tiredness. ?Forgetfulness about what happened  after the procedure. ?Impaired judgment for important decisions. ?Nausea or vomiting. ?Some difficulty with balance. ?Follow these instructions at home: ?For the time period you were told by your health care provider: ? ?  ? ?Rest as needed. ?Do not participate in activities where you could fall or become injured. ?Do not drive or use machinery. ?Do not drink alcohol. ?Do not take sleeping pills or medicines that cause drowsiness. ?Do not make important decisions or sign legal documents. ?Do not take care of children on your own. ?Eating and drinking ?Follow the diet that is recommended by your health care provider. ?Drink enough fluid to keep your urine pale yellow. ?If you vomit: ?Drink water, juice, or soup when you can drink without vomiting. ?Make sure you have little or no nausea before eating solid foods. ?General instructions ?Have a responsible adult stay with you for the time you are told. It is important to have someone help care for you until you are awake and alert. ?Take over-the-counter and prescription medicines only as told by your health care provider. ?If you have sleep apnea, surgery and certain medicines can increase your risk for breathing problems. Follow instructions from your health care provider about wearing your sleep device: ?Anytime you are sleeping, including during daytime naps. ?While taking prescription pain medicines, sleeping medicines, or medicines that make you drowsy. ?Avoid smoking. ?Keep all follow-up visits as told by your health care provider. This is important. ?Contact a health care provider if: ?You keep feeling nauseous or you keep vomiting. ?You feel light-headed. ?You are still sleepy or having trouble with balance after 24 hours. ?You develop a rash. ?You have a fever. ?You have redness or swelling around the IV site. ?Get help right away if: ?You have trouble breathing. ?You have new-onset confusion at home. ?Summary ?For several hours after your procedure, you may  feel tired. You may also be forgetful and have poor judgment. ?Have a responsible adult stay with you for the time you are told. It is important to have someone help care for you until you are awake and alert. ?Rest as told. Do not drive or operate machinery. Do not drink alcohol or take sleeping pills. ?Get help right away if you have trouble breathing, or if you suddenly become confused. ?This information is not intended to replace advice given to you by your health care provider. Make sure you discuss any questions you have with your health care provider. ?Document Revised: 08/06/2021 Document Reviewed: 08/04/2019 ?Elsevier Patient Education ? [place]. ? ?

## 2022-01-21 ENCOUNTER — Encounter (HOSPITAL_COMMUNITY)
Admission: RE | Admit: 2022-01-21 | Discharge: 2022-01-21 | Disposition: A | Discharge status: Home / Self Care | Payer: Medicare Other | Source: Ambulatory Visit | Attending: Internal Medicine | Admitting: Internal Medicine

## 2022-01-21 VITALS — BP 141/65 | HR 90 | Temp 97.7°F | Resp 18 | Ht 71.0 in | Wt 195.0 lb

## 2022-01-21 DIAGNOSIS — K7460 Unspecified cirrhosis of liver: Secondary | ICD-10-CM | POA: Diagnosis not present

## 2022-01-21 DIAGNOSIS — Z01812 Encounter for preprocedural laboratory examination: Secondary | ICD-10-CM | POA: Diagnosis present

## 2022-01-21 DIAGNOSIS — D509 Iron deficiency anemia, unspecified: Secondary | ICD-10-CM | POA: Insufficient documentation

## 2022-01-21 DIAGNOSIS — R195 Other fecal abnormalities: Secondary | ICD-10-CM | POA: Diagnosis not present

## 2022-01-21 DIAGNOSIS — E119 Type 2 diabetes mellitus without complications: Secondary | ICD-10-CM | POA: Diagnosis not present

## 2022-01-21 LAB — CBC WITH DIFFERENTIAL/PLATELET
Abs Immature Granulocytes: 0.02 10*3/uL (ref 0.00–0.07)
Basophils Absolute: 0 10*3/uL (ref 0.0–0.1)
Basophils Relative: 1 %
Eosinophils Absolute: 0.1 10*3/uL (ref 0.0–0.5)
Eosinophils Relative: 4 %
HCT: 25.7 % — ABNORMAL LOW (ref 39.0–52.0)
Hemoglobin: 8.2 g/dL — ABNORMAL LOW (ref 13.0–17.0)
Immature Granulocytes: 1 %
Lymphocytes Relative: 18 %
Lymphs Abs: 0.5 10*3/uL — ABNORMAL LOW (ref 0.7–4.0)
MCH: 29.5 pg (ref 26.0–34.0)
MCHC: 31.9 g/dL (ref 30.0–36.0)
MCV: 92.4 fL (ref 80.0–100.0)
Monocytes Absolute: 0.5 10*3/uL (ref 0.1–1.0)
Monocytes Relative: 16 %
Neutro Abs: 1.8 10*3/uL (ref 1.7–7.7)
Neutrophils Relative %: 60 %
Platelets: 98 10*3/uL — ABNORMAL LOW (ref 150–400)
RBC: 2.78 MIL/uL — ABNORMAL LOW (ref 4.22–5.81)
RDW: 14.8 % (ref 11.5–15.5)
WBC: 3 10*3/uL — ABNORMAL LOW (ref 4.0–10.5)
nRBC: 0 % (ref 0.0–0.2)

## 2022-01-21 LAB — COMPREHENSIVE METABOLIC PANEL
ALT: 20 U/L (ref 0–44)
AST: 27 U/L (ref 15–41)
Albumin: 3.6 g/dL (ref 3.5–5.0)
Alkaline Phosphatase: 84 U/L (ref 38–126)
Anion gap: 11 (ref 5–15)
BUN: 14 mg/dL (ref 8–23)
CO2: 21 mmol/L — ABNORMAL LOW (ref 22–32)
Calcium: 9.1 mg/dL (ref 8.9–10.3)
Chloride: 103 mmol/L (ref 98–111)
Creatinine, Ser: 1 mg/dL (ref 0.61–1.24)
GFR, Estimated: >60 mL/min (ref >60)
Glucose, Bld: 119 mg/dL — ABNORMAL HIGH (ref 70–99)
Potassium: 4.3 mmol/L (ref 3.5–5.1)
Sodium: 135 mmol/L (ref 135–145)
Total Bilirubin: 0.6 mg/dL (ref 0.3–1.2)
Total Protein: 6.4 g/dL — ABNORMAL LOW (ref 6.5–8.1)

## 2022-01-21 LAB — PROTIME-INR
INR: 1 (ref 0.8–1.2)
Prothrombin Time: 13.4 seconds (ref 11.4–15.2)

## 2022-01-22 NOTE — Pre-Procedure Instructions (Signed)
Dr Briant Cedar and Dr Gala Romney are aware of H&H. No orders given at this time. ?

## 2022-01-23 ENCOUNTER — Ambulatory Visit (HOSPITAL_COMMUNITY): Payer: Medicare Other | Admitting: Certified Registered Nurse Anesthetist

## 2022-01-23 ENCOUNTER — Encounter (HOSPITAL_COMMUNITY): Payer: Self-pay | Admitting: Internal Medicine

## 2022-01-23 ENCOUNTER — Encounter (HOSPITAL_COMMUNITY)
Admission: RE | Disposition: A | Discharge status: Home / Self Care | Payer: Self-pay | Source: Home / Self Care | Attending: Internal Medicine

## 2022-01-23 ENCOUNTER — Ambulatory Visit (HOSPITAL_BASED_OUTPATIENT_CLINIC_OR_DEPARTMENT_OTHER): Payer: Medicare Other | Admitting: Certified Registered Nurse Anesthetist

## 2022-01-23 ENCOUNTER — Ambulatory Visit (HOSPITAL_COMMUNITY)
Admission: RE | Admit: 2022-01-23 | Discharge: 2022-01-23 | Disposition: A | Discharge status: Home / Self Care | Payer: Medicare Other | Attending: Internal Medicine | Admitting: Internal Medicine

## 2022-01-23 DIAGNOSIS — K766 Portal hypertension: Secondary | ICD-10-CM

## 2022-01-23 DIAGNOSIS — I10 Essential (primary) hypertension: Secondary | ICD-10-CM | POA: Insufficient documentation

## 2022-01-23 DIAGNOSIS — K7460 Unspecified cirrhosis of liver: Secondary | ICD-10-CM | POA: Insufficient documentation

## 2022-01-23 DIAGNOSIS — K3189 Other diseases of stomach and duodenum: Secondary | ICD-10-CM | POA: Insufficient documentation

## 2022-01-23 DIAGNOSIS — I8510 Secondary esophageal varices without bleeding: Secondary | ICD-10-CM

## 2022-01-23 DIAGNOSIS — Z87891 Personal history of nicotine dependence: Secondary | ICD-10-CM | POA: Diagnosis not present

## 2022-01-23 DIAGNOSIS — K219 Gastro-esophageal reflux disease without esophagitis: Secondary | ICD-10-CM | POA: Insufficient documentation

## 2022-01-23 DIAGNOSIS — Z7901 Long term (current) use of anticoagulants: Secondary | ICD-10-CM | POA: Diagnosis not present

## 2022-01-23 DIAGNOSIS — E119 Type 2 diabetes mellitus without complications: Secondary | ICD-10-CM | POA: Diagnosis not present

## 2022-01-23 DIAGNOSIS — I4891 Unspecified atrial fibrillation: Secondary | ICD-10-CM | POA: Diagnosis not present

## 2022-01-23 DIAGNOSIS — Z7984 Long term (current) use of oral hypoglycemic drugs: Secondary | ICD-10-CM | POA: Diagnosis not present

## 2022-01-23 HISTORY — PX: ESOPHAGOGASTRODUODENOSCOPY (EGD) WITH PROPOFOL: SHX5813

## 2022-01-23 LAB — GLUCOSE, CAPILLARY: Glucose-Capillary: 136 mg/dL — ABNORMAL HIGH (ref 70–99)

## 2022-01-23 SURGERY — ESOPHAGOGASTRODUODENOSCOPY (EGD) WITH PROPOFOL
Anesthesia: General

## 2022-01-23 MED ORDER — LACTATED RINGERS IV SOLN: INTRAVENOUS | Status: DC

## 2022-01-23 MED ORDER — LIDOCAINE HCL (PF) 2 % IJ SOLN
INTRAMUSCULAR | Status: AC
  Filled 2022-01-23: qty 5

## 2022-01-23 MED ORDER — PROPOFOL 500 MG/50ML IV EMUL
INTRAVENOUS | Status: AC
  Filled 2022-01-23: qty 50

## 2022-01-23 MED ORDER — PROPOFOL 500 MG/50ML IV EMUL
INTRAVENOUS | Status: DC | PRN
  Administered 2022-01-23: 180 ug/kg/min via INTRAVENOUS

## 2022-01-23 MED ORDER — STERILE WATER FOR IRRIGATION IR SOLN
Status: DC | PRN
  Administered 2022-01-23: 60 mL

## 2022-01-23 MED ORDER — ETOMIDATE 2 MG/ML IV SOLN
INTRAVENOUS | Status: AC
  Filled 2022-01-23: qty 10

## 2022-01-23 MED ORDER — ETOMIDATE 2 MG/ML IV SOLN
INTRAVENOUS | Status: DC | PRN
  Administered 2022-01-23: 6 mg via INTRAVENOUS

## 2022-01-23 MED ORDER — PROPOFOL 10 MG/ML IV BOLUS
INTRAVENOUS | Status: DC | PRN
  Administered 2022-01-23: 50 mg via INTRAVENOUS

## 2022-01-23 MED ORDER — LIDOCAINE HCL (CARDIAC) PF 100 MG/5ML IV SOSY
PREFILLED_SYRINGE | INTRAVENOUS | Status: DC | PRN
  Administered 2022-01-23: 50 mg via INTRAVENOUS

## 2022-01-23 NOTE — Op Note (Signed)
[facility] ?Patient Name: Derek Drake ?Procedure Date: 01/23/2022 10:34 AM ?MRN: [id number] ?Date of Birth: 1946/02/08 ?Attending MD: Norvel Richards , MD ?CSN: [id number] ?Age: 76 ?Admit Type: Outpatient ?Procedure:                Upper GI endoscopy ?Indications:              Esophageal varices ?Providers:                Norvel Richards, MD, Janeece Riggers, RN, [place]  ?                          Page ?Referring MD:              ?Medicines:                Propofol per Anesthesia ?Complications:            No immediate complications. ?Estimated Blood Loss:     Estimated blood loss: none. ?Procedure:                Pre-Anesthesia Assessment: ?                          - Prior to the procedure, a History and Physical  ?                          was performed, and patient medications and  ?                          allergies were reviewed. The patient's tolerance of  ?                          previous anesthesia was also reviewed. The risks  ?                          and benefits of the procedure and the sedation  ?                          options and risks were discussed with the patient.  ?                          All questions were answered, and informed consent  ?                          was obtained. Prior Anticoagulants: The patient has  ?                          taken no previous anticoagulant or antiplatelet  ?                          agents and last took Eliquis (apixaban) 2 days  ?                          prior to the procedure. ASA Grade Assessment: III -  ?  A patient with severe systemic disease. After  ?                          reviewing the risks and benefits, the patient was  ?                          deemed in satisfactory condition to undergo the  ?                          procedure. ?                          After obtaining informed consent, the endoscope was  ?                          passed under direct vision. Throughout the  ?                           procedure, the patient's blood pressure, pulse, and  ?                          oxygen saturations were monitored continuously. The  ?                          GIF-H190 ([id number]) scope was introduced through the  ?                          mouth, and advanced to the second part of duodenum.  ?                          The upper GI endoscopy was accomplished without  ?                          difficulty. The patient tolerated the procedure  ?                          well. ?Scope In: 10:48:34 AM ?Scope Out: 10:51:55 AM ?Total Procedure Duration: 0 hours 3 minutes 21 seconds  ?Findings: ?     3 columns of grade 1-2 varices -a couple of short segments of's grade 2  ?     varices in the mid esophagus not in continuity with a distal esophageal  ?     varices. No stigmata of bleeding. Overlying mucosa otherwise appeared  ?     normal EG junction easily traversed. Moderate portal hypertensive  ?     gastropathy diffusely. No gastric varices seen. Patent pylorus. ?     The duodenal bulb and second portion of the duodenum were normal.  ?     Estimated blood loss: none. ?Impression:               - Grade 1-2 esophageal varices???slight progression  ?                          seen over prior examination. Portal hypertensive  ?  gastropathy. ?                          Normal duodenal bulb and second portion of the  ?                          duodenum. ?                          - No specimens collected. ?Moderate Sedation: ?     Moderate (conscious) sedation was personally administered by an  ?     anesthesia professional. The following parameters were monitored: oxygen  ?     saturation, heart rate, blood pressure, and response to care. ?Recommendation:           - Patient has a contact number available for  ?                          emergencies. The signs and symptoms of potential  ?                          delayed complications were discussed with the  ?                          patient. Return  to normal activities tomorrow.  ?                          Written discharge instructions were provided to the  ?                          patient. ?                          - Resume previous diet. ?                          - Continue present medications. Begin nonselective  ?                          beta-blockade therapy nadolol 20 mg daily. Start  ?                          low with concomitant Cardizem therapy. Progressive  ?                          pancytopenia and anemia recently. ?                          - Return to my office in 6 weeks. Repeat H&H in 2  ?                          weeks. ?Procedure Code(s):        --- Professional --- ?                          [phone], Esophagogastroduodenoscopy, flexible,  ?  transoral; diagnostic, including collection of  ?                          specimen(s) by brushing or washing, when performed  ?                          (separate procedure) ?Diagnosis Code(s):        --- Professional --- ?                          I85.00, Esophageal varices without bleeding ?CPT copyright 2019 American Medical Association. All rights reserved. ?The codes documented in this report are preliminary and upon coder review may  ?be revised to meet current compliance requirements. ?Cristopher Estimable. Nomar Broad, MD ?Norvel Richards, MD ?01/23/2022 12:01:43 PM ?This report has been signed electronically. ?Number of Addenda: 0 ?

## 2022-01-23 NOTE — Anesthesia Postprocedure Evaluation (Signed)
Anesthesia Post Note ? ?Patient: Derek Drake ? ?Procedure(s) Performed: ESOPHAGOGASTRODUODENOSCOPY (EGD) WITH PROPOFOL ? ?Patient location during evaluation: Phase II ?Anesthesia Type: General ?Level of consciousness: awake ?Pain management: pain level controlled ?Vital Signs Assessment: post-procedure vital signs reviewed and stable ?Respiratory status: spontaneous breathing and respiratory function stable ?Cardiovascular status: blood pressure returned to baseline and stable ?Postop Assessment: no headache and no apparent nausea or vomiting ?Anesthetic complications: no ?Comments: Late entry ? ? ?No notable events documented. ? ? ?Last Vitals:  ?Vitals:  ? 01/23/22 1057 01/23/22 1058  ?BP:  103/62  ?Pulse: 65   ?Resp: 10   ?Temp:    ?SpO2: 99%   ?  ?Last Pain:  ?Vitals:  ? 01/23/22 1058  ?PainSc: 0-No pain  ? ? ?  ?  ?  ?  ?  ?  ? ?Louann Sjogren ? ? ? ? ?

## 2022-01-23 NOTE — Discharge Instructions (Addendum)
EGD ?Discharge instructions ?Please read the instructions outlined below and refer to this sheet in the next few weeks. These discharge instructions provide you with general information on caring for yourself after you leave the hospital. Your doctor may also give you specific instructions. While your treatment has been planned according to the most current medical practices available, unavoidable complications occasionally occur. If you have any problems or questions after discharge, please call your doctor. ?ACTIVITY ?You may resume your regular activity but move at a slower pace for the next 24 hours.  ?Take frequent rest periods for the next 24 hours.  ?Walking will help expel (get rid of) the air and reduce the bloated feeling in your abdomen.  ?No driving for 24 hours (because of the anesthesia (medicine) used during the test).  ?You may shower.  ?Do not sign any important legal documents or operate any machinery for 24 hours (because of the anesthesia used during the test).  ?NUTRITION ?Drink plenty of fluids.  ?You may resume your normal diet.  ?Begin with a light meal and progress to your normal diet.  ?Avoid alcoholic beverages for 24 hours or as instructed by your caregiver.  ?MEDICATIONS ?You may resume your normal medications unless your caregiver tells you otherwise.  ?WHAT YOU CAN EXPECT TODAY ?You may experience abdominal discomfort such as a feeling of fullness or ?gas? pains.  ?FOLLOW-UP ?Your doctor will discuss the results of your test with you.  ?SEEK IMMEDIATE MEDICAL ATTENTION IF ANY OF THE FOLLOWING OCCUR: ?Excessive nausea (feeling sick to your stomach) and/or vomiting.  ?Severe abdominal pain and distention (swelling).  ?Trouble swallowing.  ?Temperature over 101? F (37.8? C).  ?Rectal bleeding or vomiting of blood.   ? ? ?Resume Eliquis today ? ?Begin nadolol 20 mg daily ? ?H&H in 2 weeks ? ?Office visit with [place] in 4 to 6 weeks ? ?At patient request, I called Kay at [phone]-discussed  findings and recommendations ?

## 2022-01-23 NOTE — Anesthesia Preprocedure Evaluation (Signed)
Anesthesia Evaluation  ?Patient identified by MRN, date of birth, ID band ?Patient awake ? ? ? ?Reviewed: ?Allergy & Precautions, H&P , NPO status , Patient's Chart, lab work & pertinent test results, reviewed documented beta blocker date and time  ? ?Airway ?Mallampati: II ? ?TM Distance: >3 FB ?Neck ROM: full ? ? ? Dental ?no notable dental hx. ? ?  ?Pulmonary ?neg pulmonary ROS, former smoker,  ?  ?Pulmonary exam normal ?breath sounds clear to auscultation ? ? ? ? ? ? Cardiovascular ?Exercise Tolerance: Good ?hypertension, + dysrhythmias Atrial Fibrillation  ?Rhythm:regular Rate:Normal ? ? ?  ?Neuro/Psych ?negative neurological ROS ? negative psych ROS  ? GI/Hepatic ?Neg liver ROS, GERD  Medicated,  ?Endo/Other  ?negative endocrine ROSdiabetes, Type 2 ? Renal/GU ?negative Renal ROS  ?negative genitourinary ?  ?Musculoskeletal ? ? Abdominal ?  ?Peds ? Hematology ? ?(+) Blood dyscrasia, anemia ,   ?Anesthesia Other Findings ? ? Reproductive/Obstetrics ?negative OB ROS ? ?  ? ? ? ? ? ? ? ? ? ? ? ? ? ?  ?  ? ? ? ? ? ? ? ? ?Anesthesia Physical ?Anesthesia Plan ? ?ASA: 2 ? ?Anesthesia Plan: General  ? ?Post-op Pain Management:   ? ?Induction:  ? ?PONV Risk Score and Plan: Propofol infusion ? ?Airway Management Planned:  ? ?Additional Equipment:  ? ?Intra-op Plan:  ? ?Post-operative Plan:  ? ?Informed Consent: I have reviewed the patients History and Physical, chart, labs and discussed the procedure including the risks, benefits and alternatives for the proposed anesthesia with the patient or authorized representative who has indicated his/her understanding and acceptance.  ? ? ? ?Dental Advisory Given ? ?Plan Discussed with: CRNA ? ?Anesthesia Plan Comments:   ? ? ? ? ? ? ?Anesthesia Quick Evaluation ? ?

## 2022-01-23 NOTE — H&P (Signed)
$'@LOGO'b$ @ ? ? ?Primary Care Physician:  [name], MD ?Primary Gastroenterologist:  Dr. Gala Romney ? ?Pre-Procedure History & Physical: ?HPI:  Derek Drake is a 76 y.o. male here for screening EGD.  History of cirrhosis.  History of grade 1 esophageal varices.  No dysphagia.  Eliquis held 2 days ago. ? ?Patient pancytopenic with a notable anemia now hemoglobin 8 range.  No dysphagia. ?No overt GI bleeding. ? ?He does have a chronic history of IDA and intermittent occult blood positive stools. ? ?Past Medical History:  ?Diagnosis Date  ? Essential hypertension   ? GERD (gastroesophageal reflux disease)   ? Hypercholesteremia   ? PAF (paroxysmal atrial fibrillation) ([place]) 05/04/2014  ? Type 2 diabetes mellitus ([place])   ? ? ?Past Surgical History:  ?Procedure Laterality Date  ? BIOPSY  02/05/2017  ? Procedure: BIOPSY;  Surgeon: Daneil Dolin, MD;  [facility];  Service: Endoscopy;;  gastric ?colon  ? CHOLECYSTECTOMY    ? COLONOSCOPY N/A 05/13/2013  ? Procedure: COLONOSCOPY;  Surgeon: Daneil Dolin, MD;  [facility];  Service: Endoscopy;  Laterality: N/A;  8:30 AM  ? COLONOSCOPY N/A 02/05/2017  ? Procedure: COLONOSCOPY;  Surgeon: Daneil Dolin, MD;  [facility];  Service: Endoscopy;  Laterality: N/A;  ? ESOPHAGOGASTRODUODENOSCOPY N/A 02/05/2017  ? Procedure: ESOPHAGOGASTRODUODENOSCOPY (EGD);  Surgeon: Daneil Dolin, MD;  [facility];  Service: Endoscopy;  Laterality: N/A;  730 - moved to 12:00 ?  ? ESOPHAGOGASTRODUODENOSCOPY N/A 07/04/2020  ? Procedure: ESOPHAGOGASTRODUODENOSCOPY (EGD);  Surgeon: Daneil Dolin, MD;  [facility];  Service: Endoscopy;  Laterality: N/A;  12:45pm  ? GIVENS CAPSULE STUDY N/A 07/04/2020  ? Procedure: GIVENS CAPSULE STUDY;  Surgeon: Daneil Dolin, MD;  [facility];  Service: Endoscopy;  Laterality: N/A;  Elected not to place a capsule today  ? POLYPECTOMY  02/05/2017  ? Procedure: POLYPECTOMY;  Surgeon: Daneil Dolin,  MD;  [facility];  Service: Endoscopy;;  colon  ? ? ?Prior to Admission medications   ?Medication Sig Start Date End Date Taking? Authorizing Provider  ?apixaban (ELIQUIS) 5 MG TABS tablet Take 1 tablet (5 mg total) by mouth 2 (two) times daily. 05/05/14  Yes [name], MD  ?cholecalciferol (VITAMIN D3) 25 MCG (1000 UNIT) tablet Take 1,000 Units by mouth daily.   Yes [provider]  ?diltiazem (CARDIZEM CD) 360 MG 24 hr capsule Take 360 mg by mouth daily.    Yes [provider]  ?ferrous sulfate 325 (65 FE) MG EC tablet Take 325 mg by mouth in the morning and at bedtime.    Yes [provider]  ?gabapentin (NEURONTIN) 100 MG capsule Take 100 mg by mouth at bedtime. 07/07/19  Yes [provider]  ?Astrid Drafts 500 MG CAPS Take 500 mg by mouth daily.   Yes [provider]  ?losartan (COZAAR) 100 MG tablet Take 100 mg by mouth daily.    Yes [provider]  ?metFORMIN (GLUCOPHAGE) 500 MG tablet Take 1,000 mg by mouth 2 (two) times daily with a meal.   Yes [provider]  ?milk thistle 175 MG tablet Take 175 mg by mouth daily.   Yes [provider]  ?Multiple Vitamin (MULTIVITAMIN WITH MINERALS) TABS tablet Take 1 tablet by mouth daily.   Yes [provider]  ?pantoprazole (PROTONIX) 40 MG tablet TAKE 1 TABLET BY MOUTH EVERY DAY 06/21/21  Yes Mahala Menghini,  PA-C  ?simvastatin (ZOCOR) 40 MG tablet Take 40 mg by mouth every evening.    Yes [provider]  ? ? ?Allergies as of 12/18/2021 - Review Complete 12/03/2021  ?Allergen Reaction Noted  ? Rabbit protein Swelling 05/13/2013  ? Hydrocodone-acetaminophen Rash 12/07/2015  ? Other Swelling and Rash 05/13/2013  ? Ivp dye [iodinated contrast media] Other (See Comments) 01/30/2017  ? ? ?Family History  ?Problem Relation Age of Onset  ? Prostate cancer Brother   ? Congestive Heart Failure Mother   ? Heart failure Brother   ? Colon cancer Neg Hx   ? ? ?Social History   ? ?Socioeconomic History  ? Marital status: Married  ?  Spouse name: Not on file  ? Number of children: Not on file  ? Years of education: Not on file  ? Highest education level: Not on file  ?Occupational History  ? Not on file  ?Tobacco Use  ? Smoking status: Former  ?  Packs/day: 2.00  ?  Years: 10.00  ?  Pack years: 20.00  ?  Types: Cigarettes  ?  Start date: 05/25/1956  ?  Quit date: 05/25/2009  ?  Years since quitting: 12.6  ? Smokeless tobacco: Current  ?  Types: Chew  ?Vaping Use  ? Vaping Use: Never used  ?Substance and Sexual Activity  ? Alcohol use: Not Currently  ?  Alcohol/week: 0.0 standard drinks  ?  Comment: Rare - 1 beer every 3 months  ? Drug use: No  ? Sexual activity: Not Currently  ?Other Topics Concern  ? Not on file  ?Social History Narrative  ? Not on file  ? ?Social Determinants of Health  ? ?Financial Resource Strain: Not on file  ?Food Insecurity: Not on file  ?Transportation Needs: Not on file  ?Physical Activity: Not on file  ?Stress: Not on file  ?Social Connections: Not on file  ?Intimate Partner Violence: Not on file  ? ? ?Review of Systems: ?See HPI, otherwise negative ROS ? ?Physical Exam: ?BP 134/66   Pulse 77   Temp 98 ?F (36.7 ?C)   Resp 11   SpO2 96%  ?General:   Alert,  Well-developed, well-nourished, pleasant and cooperative in NAD ?Skin:  Intact without significant lesions or rashes. ?Eyes: Somewhat pale appearing ears: Neck:  Supple; no masses or thyromegaly. No significant cervical adenopathy. ?Lungs:  Clear throughout to auscultation.   No wheezes, crackles, or rhonchi. No acute distress. ?Heart:  Regular rate and rhythm; no murmurs, clicks, rubs,  or gallops. ?Abdomen: Non-distended, normal bowel sounds.  Soft and nontender without appreciable mass or hepatosplenomegaly.  ?Pulses:  Normal pulses noted. ?Extremities:  Without clubbing or edema. ? ?Impression/Plan: 76 year old gentleman with cirrhosis here for screening EGD.  Has known grade 1 esophageal varices. ? ?I  have offered the patient a screening EGD today. ?The risks, benefits, limitations, alternatives and imponderables have been reviewed with the patient. Potential for esophageal dilation, biopsy, etc. have also been reviewed.  Questions have been answered. All parties agreeable.  ? ? ? ? ?Notice: This dictation was prepared with Dragon dictation along with smaller phrase technology. Any transcriptional errors that result from this process are unintentional and may not be corrected upon review.  ? ?

## 2022-01-23 NOTE — Transfer of Care (Signed)
Immediate Anesthesia Transfer of Care Note ? ?Patient: Derek Drake ? ?Procedure(s) Performed: ESOPHAGOGASTRODUODENOSCOPY (EGD) WITH PROPOFOL ? ?Patient Location: PACU ? ?Anesthesia Type:General ? ?Level of Consciousness: awake, alert  and oriented ? ?Airway & Oxygen Therapy: Patient Spontanous Breathing ? ?Post-op Assessment: Report given to RN, Post -op Vital signs reviewed and stable, Patient moving all extremities X 4 and Patient able to stick tongue midline ? ?Post vital signs: Reviewed ? ?Last Vitals:  ?Vitals Value Taken Time  ?BP 83/44   ?Temp 97.8   ?Pulse 65 01/23/22 1057  ?Resp 10 01/23/22 1057  ?SpO2 99 % 01/23/22 1057  ? ? ?Last Pain:  ?Vitals:  ? 01/23/22 1046  ?PainSc: 0-No pain  ?   ? ?  ? ?Complications: No notable events documented. ?

## 2022-01-30 ENCOUNTER — Encounter (HOSPITAL_COMMUNITY): Payer: Self-pay | Admitting: Internal Medicine

## 2022-02-06 ENCOUNTER — Other Ambulatory Visit: Payer: Self-pay

## 2022-02-06 DIAGNOSIS — D509 Iron deficiency anemia, unspecified: Secondary | ICD-10-CM

## 2022-02-06 LAB — HEMOGLOBIN AND HEMATOCRIT, BLOOD
HCT: 24.7 % — ABNORMAL LOW (ref 38.5–50.0)
Hemoglobin: 7.9 g/dL — ABNORMAL LOW (ref 13.2–17.1)

## 2022-02-07 ENCOUNTER — Other Ambulatory Visit: Payer: Self-pay

## 2022-02-07 DIAGNOSIS — D509 Iron deficiency anemia, unspecified: Secondary | ICD-10-CM

## 2022-02-20 ENCOUNTER — Other Ambulatory Visit: Payer: Self-pay

## 2022-02-20 DIAGNOSIS — D509 Iron deficiency anemia, unspecified: Secondary | ICD-10-CM

## 2022-02-20 LAB — HEMOGLOBIN AND HEMATOCRIT, BLOOD
HCT: 23.3 % — ABNORMAL LOW (ref 38.5–50.0)
Hemoglobin: 7.4 g/dL — ABNORMAL LOW (ref 13.2–17.1)

## 2022-02-25 ENCOUNTER — Other Ambulatory Visit: Payer: Self-pay

## 2022-02-25 DIAGNOSIS — D509 Iron deficiency anemia, unspecified: Secondary | ICD-10-CM

## 2022-02-25 DIAGNOSIS — R7989 Other specified abnormal findings of blood chemistry: Secondary | ICD-10-CM

## 2022-02-26 ENCOUNTER — Ambulatory Visit (INDEPENDENT_AMBULATORY_CARE_PROVIDER_SITE_OTHER): Payer: Self-pay | Admitting: Internal Medicine

## 2022-02-26 ENCOUNTER — Other Ambulatory Visit: Payer: Self-pay

## 2022-02-26 DIAGNOSIS — D509 Iron deficiency anemia, unspecified: Secondary | ICD-10-CM

## 2022-02-26 LAB — IRON,TIBC AND FERRITIN PANEL
%SAT: 6 % (calc) — ABNORMAL LOW (ref 20–48)
Ferritin: 10 ng/mL — ABNORMAL LOW (ref 24–380)
Iron: 28 ug/dL — ABNORMAL LOW (ref 50–180)
TIBC: 453 mcg/dL (calc) — ABNORMAL HIGH (ref 250–425)

## 2022-02-26 LAB — IFOBT (OCCULT BLOOD): IFOBT: NEGATIVE

## 2022-02-26 LAB — HEMOGLOBIN AND HEMATOCRIT, BLOOD
HCT: 23.8 % — ABNORMAL LOW (ref 38.5–50.0)
Hemoglobin: 7.3 g/dL — ABNORMAL LOW (ref 13.2–17.1)

## 2022-02-27 ENCOUNTER — Telehealth: Payer: Self-pay | Admitting: Gastroenterology

## 2022-02-27 NOTE — Telephone Encounter (Signed)
Patient recently dropped off stool sample for FOBT.  Result notes from Dr. Gala Romney state we are arranging him for a colonoscopy in the near future.  He is scheduled with me on Monday 6/19 for post procedure follow-up from his prior EGD.  Please call patient and ask if he has ongoing concerns that need to be discussed for an office visit on Monday or if he prefers to follow-up after his colonoscopy.  Venetia Night, MSN, FNP-BC, AGACNP-BC [facility]

## 2022-03-03 ENCOUNTER — Telehealth (INDEPENDENT_AMBULATORY_CARE_PROVIDER_SITE_OTHER): Payer: Medicare Other | Admitting: Gastroenterology

## 2022-03-03 ENCOUNTER — Telehealth: Payer: Self-pay | Admitting: Gastroenterology

## 2022-03-03 ENCOUNTER — Encounter: Payer: Self-pay | Admitting: Gastroenterology

## 2022-03-03 ENCOUNTER — Ambulatory Visit: Payer: Medicare Other | Admitting: Gastroenterology

## 2022-03-03 VITALS — Ht 71.0 in | Wt 181.0 lb

## 2022-03-03 DIAGNOSIS — K7469 Other cirrhosis of liver: Secondary | ICD-10-CM | POA: Diagnosis not present

## 2022-03-03 DIAGNOSIS — D500 Iron deficiency anemia secondary to blood loss (chronic): Secondary | ICD-10-CM | POA: Diagnosis not present

## 2022-03-03 DIAGNOSIS — K7460 Unspecified cirrhosis of liver: Secondary | ICD-10-CM | POA: Insufficient documentation

## 2022-03-03 NOTE — Telephone Encounter (Signed)
[facility] Clinical pool:  Patient seen virtually today for IDA.  Needs colonoscopy with Dr. Gala Romney. ASA 3. Dx: IDA. Hold Eliquis 2 days before, iron 5 days before, no metformin day before procedure or day of procedure. RUQ US abdomen routine in future due to history of cirrhosis.  Stacey: Needs follow-up after procedures in clinic.

## 2022-03-03 NOTE — H&P (View-Only) (Signed)
Primary Care Physician:  [name], MD  Primary GI: Dr. Gala Romney   Patient Location: Home   Provider Location: [facility] office   Reason for Visit: Follow-up for IDA   Persons present on the virtual encounter, with roles: patient, NP, and wife   Total time (minutes) spent on medical discussion: 10 minutes   Due to COVID-19, visit was conducted using virtual method.  Visit was requested by patient.  Virtual Visit via MyChart Video Note Due to COVID-19, visit is conducted virtually and was requested by patient.   I connected with Derek Drake on 03/03/22 at 10:30 AM EDT by video and verified that I am speaking with the correct person using two identifiers.   I discussed the limitations, risks, security and privacy concerns of performing an evaluation and management service by video and the availability of in person appointments. I also discussed with the patient that there may be a patient responsible charge related to this service. The patient expressed understanding and agreed to proceed.  Chief Complaint  Patient presents with   Follow-up    Follow up for 6 weeks     History of Present Illness: Derek Drake is a very pleasant 76 year old male with a history of NASH cirrhosis, chronic GERD, on Eliquis for afib, found to have progressive anemia over past few months with IDA.   Heme negative recently. Hgb drifting over past month, previously 8.2 in May 2023 and now 7.3 most recently. Progressive IDA. Ferritin 10 rececntly. Recommendations to update coloonscopy. His last colonoscopy was 2018 with one 4 mm polyp (benign mucosa) in descending colon, mucosal ulceration s/p biopsy, ileal erosions. Focal erosion on path.   EGD recently May 2023: 3 columns of grade 1-2 varices, portal gastropathy, normal duodenum. Began Nadolol 20 mg daily.   H/H to be rechecked on 6/27. Taking Nadolol daily. HR was 72. BP 128/67 at home.   He has no overt GI bleeding. Denies abdominal pain,  N/V. Notes food is not tasting well to him. Decreased appetite noted. He drank Ensure but this made him nauseated. Notes intermittent fatigue but not worsening. Denies abdominal distension, confusion, jaundice, mental status changes.   He is immune to Hep A. Hep B vaccination recommended last year and completed.   Past Medical History:  Diagnosis Date   Cirrhosis ([place])    Immune to Hep A. Received Hep B vaccination in 2022.   Essential hypertension    GERD (gastroesophageal reflux disease)    Hypercholesteremia    PAF (paroxysmal atrial fibrillation) ([place]) 05/04/2014   Type 2 diabetes mellitus [facility])      Past Surgical History:  Procedure Laterality Date   BIOPSY  02/05/2017   Procedure: BIOPSY;  Surgeon: Daneil Dolin, MD;  [facility];  Service: Endoscopy;;  gastric colon   CHOLECYSTECTOMY     COLONOSCOPY N/A 05/13/2013   Procedure: COLONOSCOPY;  Surgeon: Daneil Dolin, MD;  [facility];  Service: Endoscopy;  Laterality: N/A;  8:30 AM   COLONOSCOPY N/A 02/05/2017   Procedure: COLONOSCOPY;  Surgeon: Daneil Dolin, MD;  [facility];  Service: Endoscopy;  Laterality: N/A;   ESOPHAGOGASTRODUODENOSCOPY N/A 02/05/2017   Procedure: ESOPHAGOGASTRODUODENOSCOPY (EGD);  Surgeon: Daneil Dolin, MD;  [facility];  Service: Endoscopy;  Laterality: N/A;  730 - moved to 12:00    ESOPHAGOGASTRODUODENOSCOPY N/A 07/04/2020   Procedure: ESOPHAGOGASTRODUODENOSCOPY (EGD);  Surgeon: Daneil Dolin, MD;  [facility];  Service: Endoscopy;  Laterality: N/A;  12:45pm   ESOPHAGOGASTRODUODENOSCOPY (EGD) WITH PROPOFOL N/A 01/23/2022   3 columns of grade 1-2 varices, portal gastropathy, normal duodenum. Begin Nadolol 20 mg daily.   GIVENS CAPSULE STUDY N/A 07/04/2020   Procedure: GIVENS CAPSULE STUDY;  Surgeon: Daneil Dolin, MD;  [facility];  Service: Endoscopy;  Laterality: N/A;  Elected not to place a capsule today    POLYPECTOMY  02/05/2017   Procedure: POLYPECTOMY;  Surgeon: Daneil Dolin, MD;  [facility];  Service: Endoscopy;;  colon     Current Meds  Medication Sig   apixaban (ELIQUIS) 5 MG TABS tablet Take 1 tablet (5 mg total) by mouth 2 (two) times daily.   cholecalciferol (VITAMIN D3) 25 MCG (1000 UNIT) tablet Take 1,000 Units by mouth daily.   diltiazem (CARDIZEM CD) 360 MG 24 hr capsule Take 360 mg by mouth daily.    ferrous sulfate 325 (65 FE) MG EC tablet Take 325 mg by mouth in the morning and at bedtime.    gabapentin (NEURONTIN) 100 MG capsule Take 100 mg by mouth at bedtime.   Krill Oil 500 MG CAPS Take 500 mg by mouth daily.   losartan (COZAAR) 100 MG tablet Take 100 mg by mouth daily.    metFORMIN (GLUCOPHAGE) 500 MG tablet Take 1,000 mg by mouth 2 (two) times daily with a meal.   milk thistle 175 MG tablet Take 175 mg by mouth daily.   Multiple Vitamin (MULTIVITAMIN WITH MINERALS) TABS tablet Take 1 tablet by mouth daily.   nadolol (CORGARD) 20 MG tablet Take 20 mg by mouth daily.   pantoprazole (PROTONIX) 40 MG tablet TAKE 1 TABLET BY MOUTH EVERY DAY   simvastatin (ZOCOR) 40 MG tablet Take 40 mg by mouth every evening.      Family History  Problem Relation Age of Onset   Prostate cancer Brother    Congestive Heart Failure Mother    Heart failure Brother    Colon cancer Neg Hx     Social History   Socioeconomic History   Marital status: Married    Spouse name: Not on file   Number of children: Not on file   Years of education: Not on file   Highest education level: Not on file  Occupational History   Not on file  Tobacco Use   Smoking status: Former    Packs/day: 2.00    Years: 10.00    Total pack years: 20.00    Types: Cigarettes    Start date: 05/25/1956    Quit date: 05/25/2009    Years since quitting: 12.7   Smokeless tobacco: Current    Types: Chew  Vaping Use   Vaping Use: Never used  Substance and Sexual Activity   Alcohol use: Not  Currently    Alcohol/week: 0.0 standard drinks of alcohol    Comment: Rare - 1 beer every 3 months   Drug use: No   Sexual activity: Not Currently  Other Topics Concern   Not on file  Social History Narrative   Not on file   Social Determinants of Health   Financial Resource Strain: Not on file  Food Insecurity: Not on file  Transportation Needs: Not on file  Physical Activity: Not on file  Stress: Not on file  Social Connections: Not on file       Review of Systems: Gen: see HPI  CV: Denies chest pain, palpitations, syncope, peripheral edema, and claudication. Resp: Denies dyspnea  at rest, cough, wheezing, coughing up blood, and pleurisy. GI: see HPI Derm: Denies rash, itching, dry skin Psych: Denies depression, anxiety, memory loss, confusion. No homicidal or suicidal ideation.  Heme: Denies bruising, bleeding, and enlarged lymph nodes.  Observations/Objective: No distress. Unable to perform physical exam due to video encounter.   Assessment and Plan: Derek Drake is a very pleasant 76 year old male with a history of NASH cirrhosis, chronic GERD, on Eliquis for afib, found to have progressive anemia over past few months with IDA. Presenting now in follow-up due to worsening anemia.  IDA: ferritin 10 in May 2023. Hgb drifting down over past few months, most recently 7.3. Heme negative. No overt GI bleeding. Asymptomatic at this time. Next H/H upcoming in one week. EGD recently May 2023 with varices, portal gastropathy. He was started on low dose Nadolol. Will arrange diagnostic colonoscopy due to IDA, as last was in 2018. If colonoscopy normal, he will need capsule study.  Cirrhosis: MELD Na 6 with labs from May 2023. Remaining well compensated overall. He started Nadolol recently. HR 72 at home. Will not adjust this yet as currently working up worsening IDA. We can assess this again in clinic at rest. Needs [place] now. Immune to Hep A and has received Hep B vaccination.     Proceed with colonoscopy by Dr. Gala Romney in near future: the risks, benefits, and alternatives have been discussed with the patient in detail. The patient states understanding and desires to proceed. Stop iron 5 days prior No Eliquis 48 hours prior No metformin day prior or day of procedure H/H in one week already ordered RUQ [place] for routine screening purposes Will need capsule study if colonoscopy negative Return following procedures  Follow Up Instructions:    I discussed the assessment and treatment plan with the patient. The patient was provided an opportunity to ask questions and all were answered. The patient agreed with the plan and demonstrated an understanding of the instructions.   The patient was advised to call back or seek an in-person evaluation if the symptoms worsen or if the condition fails to improve as anticipated.  I provided 10 minutes of face-to-face time during this MyChart Video encounter.  Annitta Needs, PhD, ANP-BC [facility]

## 2022-03-03 NOTE — Patient Instructions (Signed)
We are arranging a colonoscopy in the near future with Dr. Gala Romney. Please stop Eliquis 2 days before the procedure. Please stop iron 5 days before the procedure. No metformin day before procedure or morning of procedure.  If colonoscopy is normal, we will likely order what is called a capsule study; this takes pictures of your small intestine.  We are ordering an ultrasound to check your liver in the near future.  Please complete blood work as you already have scheduled!   It was a pleasure to see you today. I want to create trusting relationships with patients to provide genuine, compassionate, and quality care. I value your feedback. If you receive a survey regarding your visit,  I greatly appreciate you taking time to fill this out.   Annitta Needs, PhD, ANP-BC [facility]

## 2022-03-03 NOTE — Telephone Encounter (Signed)
[place] scheduled for 6/27 at 9:30am, arrival 9:15am, npo midnight  Will call to schedule procedure when we have future schedule   Called spouse, LMOVM

## 2022-03-03 NOTE — Progress Notes (Signed)
Primary Care Physician:  [name], MD  Primary GI: Dr. Gala Romney   Patient Location: Home   Provider Location: [facility] office   Reason for Visit: Follow-up for IDA   Persons present on the virtual encounter, with roles: patient, NP, and wife   Total time (minutes) spent on medical discussion: 10 minutes   Due to COVID-19, visit was conducted using virtual method.  Visit was requested by patient.  Virtual Visit via MyChart Video Note Due to COVID-19, visit is conducted virtually and was requested by patient.   I connected with Mohsin Crum Anger on 03/03/22 at 10:30 AM EDT by video and verified that I am speaking with the correct person using two identifiers.   I discussed the limitations, risks, security and privacy concerns of performing an evaluation and management service by video and the availability of in person appointments. I also discussed with the patient that there may be a patient responsible charge related to this service. The patient expressed understanding and agreed to proceed.  Chief Complaint  Patient presents with   Follow-up    Follow up for 6 weeks     History of Present Illness: DEUCE PATERNOSTER is a very pleasant 76 year old male with a history of NASH cirrhosis, chronic GERD, on Eliquis for afib, found to have progressive anemia over past few months with IDA.   Heme negative recently. Hgb drifting over past month, previously 8.2 in May 2023 and now 7.3 most recently. Progressive IDA. Ferritin 10 rececntly. Recommendations to update coloonscopy. His last colonoscopy was 2018 with one 4 mm polyp (benign mucosa) in descending colon, mucosal ulceration s/p biopsy, ileal erosions. Focal erosion on path.   EGD recently May 2023: 3 columns of grade 1-2 varices, portal gastropathy, normal duodenum. Began Nadolol 20 mg daily.   H/H to be rechecked on 6/27. Taking Nadolol daily. HR was 72. BP 128/67 at home.   He has no overt GI bleeding. Denies abdominal pain,  N/V. Notes food is not tasting well to him. Decreased appetite noted. He drank Ensure but this made him nauseated. Notes intermittent fatigue but not worsening. Denies abdominal distension, confusion, jaundice, mental status changes.   He is immune to Hep A. Hep B vaccination recommended last year and completed.   Past Medical History:  Diagnosis Date   Cirrhosis ([place])    Immune to Hep A. Received Hep B vaccination in 2022.   Essential hypertension    GERD (gastroesophageal reflux disease)    Hypercholesteremia    PAF (paroxysmal atrial fibrillation) ([place]) 05/04/2014   Type 2 diabetes mellitus [facility])      Past Surgical History:  Procedure Laterality Date   BIOPSY  02/05/2017   Procedure: BIOPSY;  Surgeon: Daneil Dolin, MD;  [facility];  Service: Endoscopy;;  gastric colon   CHOLECYSTECTOMY     COLONOSCOPY N/A 05/13/2013   Procedure: COLONOSCOPY;  Surgeon: Daneil Dolin, MD;  Location: [facility];  Service: Endoscopy;  Laterality: N/A;  8:30 AM   COLONOSCOPY N/A 02/05/2017   Procedure: COLONOSCOPY;  Surgeon: Daneil Dolin, MD;  [facility]: [facility];  Service: Endoscopy;  Laterality: N/A;   ESOPHAGOGASTRODUODENOSCOPY N/A 02/05/2017   Procedure: ESOPHAGOGASTRODUODENOSCOPY (EGD);  Surgeon: Daneil Dolin, MD;  [facility];  Service: Endoscopy;  Laterality: N/A;  730 - moved to 12:00    ESOPHAGOGASTRODUODENOSCOPY N/A 07/04/2020   Procedure: ESOPHAGOGASTRODUODENOSCOPY (EGD);  Surgeon: Daneil Dolin, MD;  [facility];  Service: Endoscopy;  Laterality: N/A;  12:45pm   ESOPHAGOGASTRODUODENOSCOPY (EGD) WITH PROPOFOL N/A 01/23/2022   3 columns of grade 1-2 varices, portal gastropathy, normal duodenum. Begin Nadolol 20 mg daily.   GIVENS CAPSULE STUDY N/A 07/04/2020   Procedure: GIVENS CAPSULE STUDY;  Surgeon: Daneil Dolin, MD;  [facility];  Service: Endoscopy;  Laterality: N/A;  Elected not to place a capsule today    POLYPECTOMY  02/05/2017   Procedure: POLYPECTOMY;  Surgeon: Daneil Dolin, MD;  [facility];  Service: Endoscopy;;  colon     Current Meds  Medication Sig   apixaban (ELIQUIS) 5 MG TABS tablet Take 1 tablet (5 mg total) by mouth 2 (two) times daily.   cholecalciferol (VITAMIN D3) 25 MCG (1000 UNIT) tablet Take 1,000 Units by mouth daily.   diltiazem (CARDIZEM CD) 360 MG 24 hr capsule Take 360 mg by mouth daily.    ferrous sulfate 325 (65 FE) MG EC tablet Take 325 mg by mouth in the morning and at bedtime.    gabapentin (NEURONTIN) 100 MG capsule Take 100 mg by mouth at bedtime.   Krill Oil 500 MG CAPS Take 500 mg by mouth daily.   losartan (COZAAR) 100 MG tablet Take 100 mg by mouth daily.    metFORMIN (GLUCOPHAGE) 500 MG tablet Take 1,000 mg by mouth 2 (two) times daily with a meal.   milk thistle 175 MG tablet Take 175 mg by mouth daily.   Multiple Vitamin (MULTIVITAMIN WITH MINERALS) TABS tablet Take 1 tablet by mouth daily.   nadolol (CORGARD) 20 MG tablet Take 20 mg by mouth daily.   pantoprazole (PROTONIX) 40 MG tablet TAKE 1 TABLET BY MOUTH EVERY DAY   simvastatin (ZOCOR) 40 MG tablet Take 40 mg by mouth every evening.      Family History  Problem Relation Age of Onset   Prostate cancer Brother    Congestive Heart Failure Mother    Heart failure Brother    Colon cancer Neg Hx     Social History   Socioeconomic History   Marital status: Married    Spouse name: Not on file   Number of children: Not on file   Years of education: Not on file   Highest education level: Not on file  Occupational History   Not on file  Tobacco Use   Smoking status: Former    Packs/day: 2.00    Years: 10.00    Total pack years: 20.00    Types: Cigarettes    Start date: 05/25/1956    Quit date: 05/25/2009    Years since quitting: 12.7   Smokeless tobacco: Current    Types: Chew  Vaping Use   Vaping Use: Never used  Substance and Sexual Activity   Alcohol use: Not  Currently    Alcohol/week: 0.0 standard drinks of alcohol    Comment: Rare - 1 beer every 3 months   Drug use: No   Sexual activity: Not Currently  Other Topics Concern   Not on file  Social History Narrative   Not on file   Social Determinants of Health   Financial Resource Strain: Not on file  Food Insecurity: Not on file  Transportation Needs: Not on file  Physical Activity: Not on file  Stress: Not on file  Social Connections: Not on file       Review of Systems: Gen: see HPI  CV: Denies chest pain, palpitations, syncope, peripheral edema, and claudication. Resp: Denies dyspnea  at rest, cough, wheezing, coughing up blood, and pleurisy. GI: see HPI Derm: Denies rash, itching, dry skin Psych: Denies depression, anxiety, memory loss, confusion. No homicidal or suicidal ideation.  Heme: Denies bruising, bleeding, and enlarged lymph nodes.  Observations/Objective: No distress. Unable to perform physical exam due to video encounter.   Assessment and Plan: RIKER COLLIER is a very pleasant 76 year old male with a history of NASH cirrhosis, chronic GERD, on Eliquis for afib, found to have progressive anemia over past few months with IDA. Presenting now in follow-up due to worsening anemia.  IDA: ferritin 10 in May 2023. Hgb drifting down over past few months, most recently 7.3. Heme negative. No overt GI bleeding. Asymptomatic at this time. Next H/H upcoming in one week. EGD recently May 2023 with varices, portal gastropathy. He was started on low dose Nadolol. Will arrange diagnostic colonoscopy due to IDA, as last was in 2018. If colonoscopy normal, he will need capsule study.  Cirrhosis: MELD Na 6 with labs from May 2023. Remaining well compensated overall. He started Nadolol recently. HR 72 at home. Will not adjust this yet as currently working up worsening IDA. We can assess this again in clinic at rest. Needs [place] now. Immune to Hep A and has received Hep B vaccination.     Proceed with colonoscopy by Dr. Gala Romney in near future: the risks, benefits, and alternatives have been discussed with the patient in detail. The patient states understanding and desires to proceed. Stop iron 5 days prior No Eliquis 48 hours prior No metformin day prior or day of procedure H/H in one week already ordered RUQ [place] for routine screening purposes Will need capsule study if colonoscopy negative Return following procedures  Follow Up Instructions:    I discussed the assessment and treatment plan with the patient. The patient was provided an opportunity to ask questions and all were answered. The patient agreed with the plan and demonstrated an understanding of the instructions.   The patient was advised to call back or seek an in-person evaluation if the symptoms worsen or if the condition fails to improve as anticipated.  I provided 10 minutes of face-to-face time during this MyChart Video encounter.  Annitta Needs, PhD, ANP-BC [facility]

## 2022-03-03 NOTE — Telephone Encounter (Signed)
Please call wife's number to arrange. [phone]. Thanks!

## 2022-03-04 ENCOUNTER — Telehealth: Payer: Self-pay

## 2022-03-04 NOTE — Progress Notes (Signed)
Routing to Reba to put in charge.

## 2022-03-04 NOTE — Telephone Encounter (Signed)
-----   Message from Daneil Dolin, MD sent at 03/02/2022 10:29 AM EDT ----- Regarding: Nurse visit. Epic wanted me to fill in the blanks on a visit.  I do not think I saw this patient.

## 2022-03-04 NOTE — Telephone Encounter (Signed)
Noted. I believe Magda Paganini is trying to work on this for him as well. Not sure if I didn't complete something correctly on my end.

## 2022-03-04 NOTE — Telephone Encounter (Signed)
Was it for 02/26/22? If so, that was the day he dropped off the IFOBT and it is put in the system as a nurse visit and probably requires you to sign off on it.

## 2022-03-11 ENCOUNTER — Ambulatory Visit (HOSPITAL_COMMUNITY)
Admission: RE | Admit: 2022-03-11 | Discharge: 2022-03-11 | Disposition: A | Discharge status: Home / Self Care | Payer: Medicare Other | Source: Ambulatory Visit | Attending: Gastroenterology | Admitting: Gastroenterology

## 2022-03-11 DIAGNOSIS — K7469 Other cirrhosis of liver: Secondary | ICD-10-CM | POA: Insufficient documentation

## 2022-03-11 LAB — HEMOGLOBIN AND HEMATOCRIT, BLOOD
HCT: 22 % — ABNORMAL LOW (ref 38.5–50.0)
Hemoglobin: 6.6 g/dL — ABNORMAL LOW (ref 13.2–17.1)

## 2022-03-13 ENCOUNTER — Encounter: Payer: Self-pay | Admitting: *Deleted

## 2022-03-13 ENCOUNTER — Telehealth: Payer: Self-pay | Admitting: Internal Medicine

## 2022-03-13 ENCOUNTER — Encounter (HOSPITAL_COMMUNITY): Payer: Self-pay | Admitting: *Deleted

## 2022-03-13 ENCOUNTER — Emergency Department (HOSPITAL_COMMUNITY)
Admission: EM | Admit: 2022-03-13 | Discharge: 2022-03-14 | Disposition: A | Discharge status: Home / Self Care | Payer: Medicare Other | Attending: Emergency Medicine | Admitting: Emergency Medicine

## 2022-03-13 ENCOUNTER — Other Ambulatory Visit: Payer: Self-pay

## 2022-03-13 ENCOUNTER — Emergency Department (HOSPITAL_COMMUNITY): Payer: Medicare Other

## 2022-03-13 DIAGNOSIS — R0602 Shortness of breath: Secondary | ICD-10-CM | POA: Diagnosis present

## 2022-03-13 DIAGNOSIS — E119 Type 2 diabetes mellitus without complications: Secondary | ICD-10-CM | POA: Insufficient documentation

## 2022-03-13 DIAGNOSIS — Z79899 Other long term (current) drug therapy: Secondary | ICD-10-CM | POA: Insufficient documentation

## 2022-03-13 DIAGNOSIS — D649 Anemia, unspecified: Secondary | ICD-10-CM | POA: Diagnosis not present

## 2022-03-13 DIAGNOSIS — Z7901 Long term (current) use of anticoagulants: Secondary | ICD-10-CM | POA: Diagnosis not present

## 2022-03-13 DIAGNOSIS — Z7984 Long term (current) use of oral hypoglycemic drugs: Secondary | ICD-10-CM | POA: Insufficient documentation

## 2022-03-13 DIAGNOSIS — I10 Essential (primary) hypertension: Secondary | ICD-10-CM | POA: Insufficient documentation

## 2022-03-13 LAB — CBC WITH DIFFERENTIAL/PLATELET
Abs Immature Granulocytes: 0.03 10*3/uL (ref 0.00–0.07)
Basophils Absolute: 0 10*3/uL (ref 0.0–0.1)
Basophils Relative: 0 %
Eosinophils Absolute: 0.1 10*3/uL (ref 0.0–0.5)
Eosinophils Relative: 4 %
HCT: 22.8 % — ABNORMAL LOW (ref 39.0–52.0)
Hemoglobin: 6.7 g/dL — CL (ref 13.0–17.0)
Immature Granulocytes: 1 %
Lymphocytes Relative: 18 %
Lymphs Abs: 0.5 10*3/uL — ABNORMAL LOW (ref 0.7–4.0)
MCH: 27.3 pg (ref 26.0–34.0)
MCHC: 29.4 g/dL — ABNORMAL LOW (ref 30.0–36.0)
MCV: 93.1 fL (ref 80.0–100.0)
Monocytes Absolute: 0.5 10*3/uL (ref 0.1–1.0)
Monocytes Relative: 19 %
Neutro Abs: 1.6 10*3/uL — ABNORMAL LOW (ref 1.7–7.7)
Neutrophils Relative %: 58 %
Platelets: 100 10*3/uL — ABNORMAL LOW (ref 150–400)
RBC: 2.45 MIL/uL — ABNORMAL LOW (ref 4.22–5.81)
RDW: 15.5 % (ref 11.5–15.5)
WBC: 2.7 10*3/uL — ABNORMAL LOW (ref 4.0–10.5)
nRBC: 0 % (ref 0.0–0.2)

## 2022-03-13 LAB — BASIC METABOLIC PANEL
Anion gap: 7 (ref 5–15)
BUN: 17 mg/dL (ref 8–23)
CO2: 25 mmol/L (ref 22–32)
Calcium: 9.4 mg/dL (ref 8.9–10.3)
Chloride: 101 mmol/L (ref 98–111)
Creatinine, Ser: 1.03 mg/dL (ref 0.61–1.24)
GFR, Estimated: >60 mL/min (ref >60)
Glucose, Bld: 143 mg/dL — ABNORMAL HIGH (ref 70–99)
Potassium: 4.6 mmol/L (ref 3.5–5.1)
Sodium: 133 mmol/L — ABNORMAL LOW (ref 135–145)

## 2022-03-13 LAB — ABO/RH: ABO/RH(D): O POS

## 2022-03-13 LAB — POC OCCULT BLOOD, ED: Fecal Occult Bld: POSITIVE — AB

## 2022-03-13 MED ORDER — SODIUM CHLORIDE 0.9 % IV SOLN
10.0000 mL/h | Freq: Once | INTRAVENOUS | Status: AC
  Administered 2022-03-13: 10 mL/h via INTRAVENOUS

## 2022-03-13 MED ORDER — PEG 3350-KCL-NA BICARB-NACL 420 G PO SOLR
ORAL | 0 refills | Status: DC
Start: 2022-03-13 — End: 2022-03-20

## 2022-03-13 NOTE — Telephone Encounter (Signed)
Spoke with spouse, advised just received aug schedule. He has been scheduled for procedure. Aware will mail prep instructions/pre-op appt. Rx sent to pharmacy.  She is also requesting his lab/US results. Please advise Dr. Gala Romney. Thanks!

## 2022-03-13 NOTE — ED Notes (Signed)
Lab called and reports pt has positive antibodies and they will need to obtain his unit of blood from [place]. They will report this will be take some time to get it here. Dr. Roderic Palau made aware.

## 2022-03-13 NOTE — ED Notes (Signed)
Lab stated they would call this nurse once blood is ready for transfusion.

## 2022-03-13 NOTE — Telephone Encounter (Signed)
PATIENT WIFE CALLED ASKING WHEN PATIENT MIGHT BE SCHEDULED FOR HIS PROCEDURE

## 2022-03-13 NOTE — ED Triage Notes (Signed)
Pt states he was sent here for a blood transfusion.  Hgb is low, was told it was 6.8.  SOB with exertion.

## 2022-03-13 NOTE — ED Provider Notes (Signed)
[facility] Provider Note   CSN: [id number] Arrival date & time: 03/13/22  1112     History  Chief Complaint  Patient presents with   Anemia    Derek Drake is a 76 y.o. male with a history including type 2 diabetes, hypertension, paroxysmal atrial fibrillation, hepatic cirrhosis, chronic iron deficiency anemia with a hemoglobin that has been drifting down, most recently was checked in May by his gastroenterologist and was 7.3 but asymptomatic.  He has been Hemoccult negative and denies any visible GI bleeding.  He had a recent EGD revealing esophageal varices and portal gastropathy.  He is scheduled for diagnostic colonoscopy in August.  He was called by his gastroenterologist today after screening blood work revealed a hemoglobin of 6.8.  He does endorse having developed shortness of breath with minimal exertion just within the last 1 to 2 weeks.  He denies chest pain, he does get transient lightheadedness with quick positional changes.  He was sent here by Dr. Gala Romney for blood transfusion.  He has never been transfused in the past but is willing to undergo this today.  The history is provided by the patient.       Home Medications Prior to Admission medications   Medication Sig Start Date End Date Taking? Authorizing Provider  apixaban (ELIQUIS) 5 MG TABS tablet Take 1 tablet (5 mg total) by mouth 2 (two) times daily. 05/05/14  Yes [name], MD  cholecalciferol (VITAMIN D3) 25 MCG (1000 UNIT) tablet Take 1,000 Units by mouth daily.   Yes [provider]  diltiazem (CARDIZEM CD) 360 MG 24 hr capsule Take 360 mg by mouth daily.    Yes [provider]  ferrous sulfate 325 (65 FE) MG EC tablet Take 325 mg by mouth in the morning and at bedtime.    Yes [provider]  gabapentin (NEURONTIN) 100 MG capsule Take 100 mg by mouth at bedtime. 07/07/19  Yes [provider]  [name] 500 MG CAPS Take 500 mg by mouth daily.   Yes  [provider]  losartan (COZAAR) 100 MG tablet Take 100 mg by mouth daily.    Yes [provider]  metFORMIN (GLUCOPHAGE) 500 MG tablet Take 1,000 mg by mouth 2 (two) times daily with a meal.   Yes [provider]  milk thistle 175 MG tablet Take 175 mg by mouth daily.   Yes [provider]  Multiple Vitamin (MULTIVITAMIN WITH MINERALS) TABS tablet Take 1 tablet by mouth daily.   Yes [provider]  nadolol (CORGARD) 20 MG tablet Take 20 mg by mouth daily.   Yes [provider]  pantoprazole (PROTONIX) 40 MG tablet TAKE 1 TABLET BY MOUTH EVERY DAY Patient taking differently: Take 40 mg by mouth daily. 06/21/21  Yes Mahala Menghini, PA-C  simvastatin (ZOCOR) 40 MG tablet Take 40 mg by mouth every evening.    Yes [provider]  polyethylene glycol-electrolytes (NULYTELY) 420 g solution As directed 03/13/22   Rourk, Cristopher Estimable, MD      Allergies    Rabbit protein, Hydrocodone-acetaminophen, Other, and Ivp dye [iodinated contrast media]    Review of Systems   Review of Systems  Constitutional:  Positive for fatigue. Negative for fever.  HENT:  Negative for congestion and sore throat.   Eyes: Negative.   Respiratory:  Positive for shortness of breath. Negative for chest tightness.   Cardiovascular:  Negative for chest pain.  Gastrointestinal:  Negative for  abdominal pain, blood in stool, nausea and vomiting.  Genitourinary: Negative.   Musculoskeletal:  Negative for arthralgias, joint swelling and neck pain.  Skin: Negative.  Negative for rash and wound.  Neurological:  Negative for dizziness, weakness, light-headedness, numbness and headaches.  Hematological:  Does not bruise/bleed easily.  Psychiatric/Behavioral: Negative.    All other systems reviewed and are negative.   Physical Exam Updated Vital Signs BP 129/61   Pulse 69   Temp 97.8 F (36.6 C) (Oral)   Resp 12   Ht '5\' 11"'$  (1.803 m)   Wt 82.1 kg   SpO2 99%    BMI 25.24 kg/m  Physical Exam Vitals and nursing note reviewed. Exam conducted with a chaperone present.  Constitutional:      Appearance: He is well-developed.     Comments: General pale appearance. Pale conjunctiva  HENT:     Head: Normocephalic and atraumatic.  Eyes:     Conjunctiva/sclera: Conjunctivae normal.  Cardiovascular:     Rate and Rhythm: Normal rate and regular rhythm.     Heart sounds: Normal heart sounds.  Pulmonary:     Effort: Pulmonary effort is normal.     Breath sounds: Normal breath sounds. No wheezing or rhonchi.  Abdominal:     General: Bowel sounds are normal.     Palpations: Abdomen is soft.     Tenderness: There is no abdominal tenderness. There is no guarding.  Genitourinary:    Rectum: Guaiac result positive. No tenderness.     Comments: No gross blood, hemoccult positive Musculoskeletal:        General: Normal range of motion.     Cervical back: Normal range of motion.  Skin:    General: Skin is warm and dry.  Neurological:     Mental Status: He is alert.     ED Results / Procedures / Treatments   Labs (all labs ordered are listed, but only abnormal results are displayed) Labs Reviewed  CBC WITH DIFFERENTIAL/PLATELET - Abnormal; Notable for the following components:      Result Value   WBC 2.7 (*)    RBC 2.45 (*)    Hemoglobin 6.7 (*)    HCT 22.8 (*)    MCHC 29.4 (*)    Platelets 100 (*)    Neutro Abs 1.6 (*)    Lymphs Abs 0.5 (*)    All other components within normal limits  BASIC METABOLIC PANEL - Abnormal; Notable for the following components:   Sodium 133 (*)    Glucose, Bld 143 (*)    All other components within normal limits  POC OCCULT BLOOD, ED - Abnormal; Notable for the following components:   Fecal Occult Bld POSITIVE (*)    All other components within normal limits  TYPE AND SCREEN  PREPARE RBC (CROSSMATCH)    EKG None  Radiology DG Chest Portable 1 View  Result Date: 03/13/2022 CLINICAL DATA:  Pt states he  was sent here for a blood transfusion. Hgb is low, was told it was 6.8. SOB with exertion. Denies CP. Hx of HTN. EXAM: PORTABLE CHEST - 1 VIEW COMPARISON:  05/04/2014 FINDINGS: Lungs are clear. Heart size and mediastinal contours are within normal limits. No effusion. Visualized bones unremarkable. IMPRESSION: No acute cardiopulmonary disease. Electronically Signed   By: Lucrezia Europe M.D.   On: 03/13/2022 14:24    Procedures Procedures    Medications Ordered in ED Medications  0.9 %  sodium chloride infusion (has no administration in time range)  ED Course/ Medical Decision Making/ A&P                           Medical Decision Making Patient with symptomatic anemia which is a chronic condition associated with his liver cirrhosis, however he is Hemoccult positive today as well.  He has had a recent upper endoscopy revealing for nonbleeding esophageal varices.  He is anticipating a colonoscopy in August and if this is negative for source of blood loss a Givens capsule is also being planned.  He is symptomatic today with shortness of breath with exertion, comfortable at rest, no chest pain, no dizziness.  He has no abdominal pain.  Blood work today confirms hemoglobin of 6.7.  He will require blood transfusion.  Patient is agreeable to this treatment plan.  Patient has antibodies, currently awaiting identification of appropriate blood to transfuse.  Plan will be to discharge home after his transfusion assuming patient remains stable.  Patient was discussed with Ranell Patrick, PA-C who assumes care of patient.  Amount and/or Complexity of Data Reviewed Labs: ordered.    Details: Per above, hemoglobin is 6.7, this is a microcytic anemia.  He also has a WBC count of 2.7, platelets are also low at 100.    Awaiting type and cross, patient does have antibodies so there will be a delay in finding blood to transfuse. Radiology: ordered. Discussion of management or test interpretation with external  provider(s): Patient was discussed with Dr. Abbey Chatters of gastroenterology including patient's current labs and Hemoccult positivity.  He confirmed that he would like the patient to have 1 unit of PRBCs and if he remains stable he can be discharged home with close follow-up with GI.  He has also requested that patient be advised that if he starts seeing visible blood he needs to return here immediately.  Risk Prescription drug management.           Final Clinical Impression(s) / ED Diagnoses Final diagnoses:  Symptomatic anemia    Rx / DC Orders ED Discharge Orders     None         Landis Martins 03/13/22 1901    Milton Ferguson, MD 03/14/22 1715

## 2022-03-13 NOTE — Telephone Encounter (Signed)
Noted. Can he keep procedure as scheduled for now?

## 2022-03-13 NOTE — Telephone Encounter (Signed)
Mindy: he is aware of labs. I just addressed ultrasound. He is going to the ED due to symptomatic anemia.

## 2022-03-13 NOTE — ED Provider Notes (Signed)
  Physical Exam  BP 124/65   Pulse 74   Temp 98.4 F (36.9 C) (Oral)   Resp (!) 9   Ht '5\' 11"'$  (1.803 m)   Wt 82.1 kg   SpO2 96%   BMI 25.24 kg/m   Physical Exam Vitals and nursing note reviewed.  Constitutional:      General: He is not in acute distress.    Appearance: Normal appearance.  HENT:     Head: Normocephalic and atraumatic.  Eyes:     General:        Right eye: No discharge.        Left eye: No discharge.  Cardiovascular:     Comments: Regular rate and rhythm.  S1/S2 are distinct without any evidence of murmur, rubs, or gallops.  Radial pulses are 2+ bilaterally.  Dorsalis pedis pulses are 2+ bilaterally.  No evidence of pedal edema. Pulmonary:     Comments: Clear to auscultation bilaterally.  Normal effort.  No respiratory distress.  No evidence of wheezes, rales, or rhonchi heard throughout. Abdominal:     General: Abdomen is flat. Bowel sounds are normal. There is no distension.     Tenderness: There is no abdominal tenderness. There is no guarding or rebound.  Musculoskeletal:        General: Normal range of motion.     Cervical back: Neck supple.  Skin:    General: Skin is warm and dry.     Findings: No rash.  Neurological:     General: No focal deficit present.     Mental Status: He is alert.  Psychiatric:        Mood and Affect: Mood normal.        Behavior: Behavior normal.     Procedures  Procedures  ED Course / MDM    Medical Decision Making Amount and/or Complexity of Data Reviewed Labs: ordered. Radiology: ordered.  Risk Prescription drug management.   Accepted handoff at shift change from Idol PA-C. Please see prior provider note for more detail.   Briefly: Patient is 76 y.o. patient with history of liver cirrhosis who is presenting today for symptomatic anemia.  Patient had outpatient blood work done which showed a hemoglobin of 6.7.  Has been complaining of fatigue and dyspnea on exertion.   Plan: Patient needs blood transfusion  but has known antibodies.  These were being mixed at [facility] and courier to over here.  Idol PA-C spoke with patient and GI we will closely follow him upon discharge.  Patient is currently getting blood now and pending no immediate complications patient can likely go home afterwards.           Hendricks Limes, PA-C 03/13/22 2307    Teressa Lower, MD 03/14/22 1332

## 2022-03-14 NOTE — Discharge Instructions (Signed)
Follow-up with your gastroenterologist and primary doctor.  Return to the ER if you experience any new and/or concerning symptoms.

## 2022-03-17 LAB — BPAM RBC
Blood Product Expiration Date: 202307182359
Blood Product Expiration Date: 202307182359
ISSUE DATE / TIME: 202306292157
ISSUE DATE / TIME: 202306292157
Unit Type and Rh: 5100
Unit Type and Rh: 5100

## 2022-03-17 LAB — TYPE AND SCREEN
ABO/RH(D): O POS
Antibody Screen: POSITIVE
DAT, IgG: POSITIVE
Unit division: 0
Unit division: 0

## 2022-03-17 NOTE — Telephone Encounter (Signed)
Looks like Derek Drake is seeing him this week. We may need to bump this procedure up as Hgb continues to drop.

## 2022-03-18 NOTE — Progress Notes (Unsigned)
Referring Provider: [name], MD Primary Care Physician:  [name], MD Primary GI Physician: Dr. Gala Romney  Chief Complaint  Patient presents with   Follow-up    HPI:   Derek Drake is a 76 y.o. male  with a history of NASH cirrhosis, chronic GERD, A fib on Eliquis, IDA previously suspected to be secondary to antral vascular ectasia, and found to have progressive anemia over the past few months with IDA, presenting today for follow-up of worsening anemia, last seen on 03/03/22 via virtual visit and was scheduled for a colonoscopy (8/2) for further evaluation of IDA.   Regarding his anemia, Hgb down to 8.2 in May 2023 with progressive decline over the last couple of months. Ferritin 10 in June. Recent EGD May 2023 with 3 columns of grade 1-2 varices, portal gastropathy, normal duodenum. Began Nadolol 20 mg daily.  Recommended updating a colonoscopy, currently scheduled for 04/16/22.  Hgb declined to 6.6 on 6/27.  Seen in the emergency room on 6/29 for symptomatic anemia without overt GI bleeding.  Stool was heme positive.  Received 1 unit PRBCs.   Today:   Anemia:  Feeling better after receiving blood. No SOB, weakness, pre-syncope, CP, or palpitation. Denies brbpr. Reports stools are dark since being on iron. Denies hematuria, no any other obvious blood loss.   NSAIDs: None.  Taking iron twice daily.   GERD is well controlled on Protonix 40 mg daily. No dysphagia. Bowels moving well.  No abdominal pain.  Cirrhosis:  MELD Na 6 with labs from May 2023.  [place] up to date in June.  Started Nadolol 20 mg daily in May for variceal bleeding prophylaxis.  BP usually 120/60s at home. Always a little higher at the doctors office.  Doing well.   Last colonoscopy was 2018 with one 4 mm polyp (benign mucosa) in descending colon, mucosal ulceration s/p biopsy, ileal erosions. Focal erosion on path.   Past Medical History:  Diagnosis Date   Cirrhosis ([place])    Immune to Hep A. Received  Hep B vaccination in 2022.   Essential hypertension    GERD (gastroesophageal reflux disease)    Hypercholesteremia    PAF (paroxysmal atrial fibrillation) ([place]) 05/04/2014   Type 2 diabetes mellitus [facility])     Past Surgical History:  Procedure Laterality Date   BIOPSY  02/05/2017   Procedure: BIOPSY;  Surgeon: Daneil Dolin, MD;  [facility];  Service: Endoscopy;;  gastric colon   CHOLECYSTECTOMY     COLONOSCOPY N/A 05/13/2013   Procedure: COLONOSCOPY;  Surgeon: Daneil Dolin, MD;  [facility];  Service: Endoscopy;  Laterality: N/A;  8:30 AM   COLONOSCOPY N/A 02/05/2017   Procedure: COLONOSCOPY;  Surgeon: Daneil Dolin, MD;  [facility];  Service: Endoscopy;  Laterality: N/A;   ESOPHAGOGASTRODUODENOSCOPY N/A 02/05/2017   Procedure: ESOPHAGOGASTRODUODENOSCOPY (EGD);  Surgeon: Daneil Dolin, MD;  [facility];  Service: Endoscopy;  Laterality: N/A;  730 - moved to 12:00    ESOPHAGOGASTRODUODENOSCOPY N/A 07/04/2020   Procedure: ESOPHAGOGASTRODUODENOSCOPY (EGD);  Surgeon: Daneil Dolin, MD;  [facility];  Service: Endoscopy;  Laterality: N/A;  12:45pm   ESOPHAGOGASTRODUODENOSCOPY (EGD) WITH PROPOFOL N/A 01/23/2022   3 columns of grade 1-2 varices, portal gastropathy, normal duodenum. Begin Nadolol 20 mg daily.   GIVENS CAPSULE STUDY N/A 07/04/2020   Procedure: GIVENS CAPSULE STUDY;  Surgeon: Daneil Dolin, MD;  [facility];  Service: Endoscopy;  Laterality: N/A;  Elected not to place a capsule today   POLYPECTOMY  02/05/2017   Procedure: POLYPECTOMY;  Surgeon: Daneil Dolin, MD;  [facility];  Service: Endoscopy;;  colon    Current Outpatient Medications  Medication Sig Dispense Refill   apixaban (ELIQUIS) 5 MG TABS tablet Take 1 tablet (5 mg total) by mouth 2 (two) times daily. 60 tablet 0   cholecalciferol (VITAMIN D3) 25 MCG (1000 UNIT) tablet Take 1,000 Units by mouth daily.      diltiazem (CARDIZEM CD) 360 MG 24 hr capsule Take 360 mg by mouth daily.      ferrous sulfate 325 (65 FE) MG EC tablet Take 325 mg by mouth in the morning and at bedtime.      gabapentin (NEURONTIN) 100 MG capsule Take 100 mg by mouth at bedtime.     Krill Oil 500 MG CAPS Take 500 mg by mouth daily.     losartan (COZAAR) 100 MG tablet Take 100 mg by mouth daily.      metFORMIN (GLUCOPHAGE) 500 MG tablet Take 1,000 mg by mouth 2 (two) times daily with a meal.     milk thistle 175 MG tablet Take 175 mg by mouth daily.     Multiple Vitamin (MULTIVITAMIN WITH MINERALS) TABS tablet Take 1 tablet by mouth daily.     nadolol (CORGARD) 20 MG tablet Take 20 mg by mouth daily.     pantoprazole (PROTONIX) 40 MG tablet TAKE 1 TABLET BY MOUTH EVERY DAY (Patient taking differently: Take 40 mg by mouth daily.) 90 tablet 3   simvastatin (ZOCOR) 40 MG tablet Take 40 mg by mouth every evening.      No current facility-administered medications for this visit.    Allergies as of 03/20/2022 - Review Complete 03/20/2022  Allergen Reaction Noted   Rabbit protein Swelling 05/13/2013   Hydrocodone-acetaminophen Rash 12/07/2015   Other Swelling and Rash 05/13/2013   Ivp dye [iodinated contrast media] Other (See Comments) 01/30/2017    Family History  Problem Relation Age of Onset   Prostate cancer Brother    Congestive Heart Failure Mother    Heart failure Brother    Colon cancer Neg Hx     Social History   Socioeconomic History   Marital status: Married    Spouse name: Not on file   Number of children: Not on file   Years of education: Not on file   Highest education level: Not on file  Occupational History   Not on file  Tobacco Use   Smoking status: Former    Packs/day: 2.00    Years: 10.00    Total pack years: 20.00    Types: Cigarettes    Start date: 05/25/1956    Quit date: 05/25/2009    Years since quitting: 12.8   Smokeless tobacco: Current    Types: Chew  Vaping Use   Vaping Use:  Never used  Substance and Sexual Activity   Alcohol use: Not Currently   Drug use: No   Sexual activity: Not Currently  Other Topics Concern   Not on file  Social History Narrative   Not on file   Social Determinants of Health   Financial Resource Strain: Not on file  Food Insecurity: Not on file  Transportation Needs: Not on file  Physical Activity: Not on file  Stress: Not on file  Social Connections: Not on file    Review of Systems: Gen: Denies fever, chills, cold or flu like symptoms,  pre-syncope, or syncope.  CV: Denies chest pain, palpitations. Resp: Denies dyspnea, cough.  GI: See HPI Heme: See HPI  Physical Exam: BP (!) 145/67   Pulse 72   Temp 97.8 F (36.6 C) (Temporal)   Ht '5\' 11"'$  (1.803 m)   Wt 182 lb 12.8 oz (82.9 kg)   BMI 25.50 kg/m  General:   Alert and oriented. No distress noted. Pleasant and cooperative.  Head:  Normocephalic and atraumatic. Eyes:  Conjuctiva clear without scleral icterus. Heart:  S1, S2 present without murmurs appreciated. Lungs:  Clear to auscultation bilaterally. No wheezes, rales, or rhonchi. No distress.  Abdomen:  +BS, soft, non-tender and non-distended. No rebound or guarding. No HSM or masses noted. Msk:  Symmetrical without gross deformities. Normal posture. Extremities:  Without edema. Neurologic:  Alert and  oriented x4 Psych:  Normal mood and affect.    Assessment:  76 y.o. male  with a history of NASH cirrhosis, chronic GERD, A fib on Eliquis, IDA previously suspected to be secondary to antral vascular ectasia, and found to have progressive anemia over the past few months with IDA, presenting today for follow-up of worsening anemia.  IDA:  Hgb down to 8.2 in May 2023 with progressive decline over the last couple of months. Ferritin 10 in June. Last colonoscopy in 2018 with 4 mm benign polyp, mucosal ulceration with focal erosion on pathology, ileal erosion. EGD updated May 2023 with 3 columns of grade 1-2 varices,  portal gastropathy, normal duodenum. Recommended updating a colonoscopy, currently scheduled for 04/16/22.  Hgb declined to 6.6 on 6/27.  Seen in the emergency room on 6/29 for symptomatic anemia without overt GI bleeding.  Stool was heme positive.  Received 1 unit PRBCs with no repeat blood work since that time. Clinically, he reports feeling much improved.  Denies BRBPR.  Stools been dark since being on oral iron.  No other obvious blood loss.  We will plan to update CBC today and move his colonoscopy up as Dr. Gala Romney has had a cancellation on 7/20.  If colonoscopy is unrevealing, he will need capsule endoscopy to evaluate his small bowel.  NASH Cirrhosis:  MELD Na 6 with labs from May 2023. [place] up to date in June witohut focal liver lesions. EGD in May with 3 columns of grade 1-2 varices, portal gastropathy. He was started on Nadolol 20 mg daily. HR 72 today, BP 145/67, but patient reports BP is usually 120s/60s at home. Likely needs Nadolol increased, but in the setting of worsening anemia, recently with symptomatic anemia requiring blood transfusion, will hold off on increasing nadolol for now. Clinically, he remains well compensated. Immune to Hep A and has received Hep B vaccination.   GERD: Well-controlled on Protonix 40 mg daily.   Plan:  Move colonoscopy with propofol with Dr. Gala Romney up to 04/03/2022. ASA 3 Hold iron x5 days prior to procedure. Hold Eliquis x2 days prior to procedure. Separate instructions previously provided for diabetes medication adjustments. Update CBC. Continue iron twice daily. Continue Protonix 40 mg daily. Avoid all NSAIDs. Follow-up after colonoscopy.  Will need to pursue capsule endoscopy if colonoscopy is unrevealing   Aliene Altes, PA-C [facility]astroenter[facility] 03/20/2022

## 2022-03-20 ENCOUNTER — Encounter: Payer: Self-pay | Admitting: Gastroenterology

## 2022-03-20 ENCOUNTER — Ambulatory Visit (INDEPENDENT_AMBULATORY_CARE_PROVIDER_SITE_OTHER): Payer: Medicare Other | Admitting: Gastroenterology

## 2022-03-20 ENCOUNTER — Encounter: Payer: Self-pay | Admitting: *Deleted

## 2022-03-20 VITALS — BP 145/67 | HR 72 | Temp 97.8°F | Ht 71.0 in | Wt 182.8 lb

## 2022-03-20 DIAGNOSIS — K219 Gastro-esophageal reflux disease without esophagitis: Secondary | ICD-10-CM | POA: Insufficient documentation

## 2022-03-20 DIAGNOSIS — K7469 Other cirrhosis of liver: Secondary | ICD-10-CM | POA: Diagnosis not present

## 2022-03-20 DIAGNOSIS — D500 Iron deficiency anemia secondary to blood loss (chronic): Secondary | ICD-10-CM

## 2022-03-20 LAB — CBC WITH DIFFERENTIAL/PLATELET
Absolute Monocytes: 565 cells/uL (ref 200–950)
Basophils Absolute: 10 cells/uL (ref 0–200)
Basophils Relative: 0.4 %
Eosinophils Absolute: 110 cells/uL (ref 15–500)
Eosinophils Relative: 4.4 %
HCT: 24.9 % — ABNORMAL LOW (ref 38.5–50.0)
Hemoglobin: 7.7 g/dL — ABNORMAL LOW (ref 13.2–17.1)
Lymphs Abs: 393 cells/uL — ABNORMAL LOW (ref 850–3900)
MCH: 28 pg (ref 27.0–33.0)
MCHC: 30.9 g/dL — ABNORMAL LOW (ref 32.0–36.0)
MCV: 90.5 fL (ref 80.0–100.0)
MPV: 10.5 fL (ref 7.5–12.5)
Monocytes Relative: 22.6 %
Neutro Abs: 1423 cells/uL — ABNORMAL LOW (ref 1500–7800)
Neutrophils Relative %: 56.9 %
Platelets: 96 10*3/uL — ABNORMAL LOW (ref 140–400)
RBC: 2.75 10*6/uL — ABNORMAL LOW (ref 4.20–5.80)
RDW: 14.1 % (ref 11.0–15.0)
Total Lymphocyte: 15.7 %
WBC: 2.5 10*3/uL — ABNORMAL LOW (ref 3.8–10.8)

## 2022-03-20 NOTE — Telephone Encounter (Signed)
Patient seen in clinic today. He is feeling better following recent blood transfusion. Planning to repeat CBC. Colonoscopy moved up to 7/20.   Derek Drake

## 2022-03-20 NOTE — Patient Instructions (Addendum)
We will move your colonoscopy up to 04/03/22.   Please have blood work completed at [facility]   Continue iron twice daily, but remember to hold x 5 days prior to your procedure.   Continue Protonix 40 mg daily.  Continue to avoid all NSAID products including ibuprofen, Aleve, Advil, BC powders, Goody powders, and anything that says "NSAID" on the package.  We will follow-up with you in the office after your colonoscopy.  Do not hesitate to call sooner if you have any questions or concerns.  It was very nice meeting you today!  Aliene Altes, PA-C [facility] Gastroenterology

## 2022-03-21 ENCOUNTER — Encounter: Payer: Self-pay | Admitting: *Deleted

## 2022-03-21 NOTE — Patient Instructions (Signed)
Derek Drake  03/21/2022     '@PREFPERIOPPHARMACY'$ @   Your procedure is scheduled on  03/26/2022.   Report to [facility] at  1245  P.M.   Call this number if you have problems the morning of surgery:  [phone]   Remember:  Follow the diet and prep instructions given to you by the office.     Your last dose of iron should have been on 03/20/2022.     Your last dose of eliquis should be on 03/23/2022.     Take these medicines the morning of surgery with A SIP OF WATER                 cardiazem, corgard, protonix.     Do not wear jewelry, make-up or nail polish.  Do not wear lotions, powders, or perfumes, or deodorant.  Do not shave 48 hours prior to surgery.  Men may shave face and neck.  Do not bring valuables to the hospital.  [facility] is not responsible for any belongings or valuables.  Contacts, dentures or bridgework may not be worn into surgery.  Leave your suitcase in the car.  After surgery it may be brought to your room.  For patients admitted to the hospital, discharge time will be determined by your treatment team.  Patients discharged the day of surgery will not be allowed to drive home and must have someone with them for 24 hours.    Special instructions:   DO NOT smoke tobacco or vape for 24 hours before your procedure.  Please read over the following fact sheets that you were given. Anesthesia Post-op Instructions and Care and Recovery After Surgery      Colonoscopy, Adult, Care After The following information offers guidance on how to care for yourself after your procedure. Your health care provider may also give you more specific instructions. If you have problems or questions, contact your health care provider. What can I expect after the procedure? After the procedure, it is common to have: A small amount of blood in your stool for 24 hours after the procedure. Some gas. Mild cramping or bloating of your abdomen. Follow these  instructions at home: Eating and drinking  Drink enough fluid to keep your urine pale yellow. Follow instructions from your health care provider about eating or drinking restrictions. Resume your normal diet as told by your health care provider. Avoid heavy or fried foods that are hard to digest. Activity Rest as told by your health care provider. Avoid sitting for a long time without moving. Get up to take short walks every 1-2 hours. This is important to improve blood flow and breathing. Ask for help if you feel weak or unsteady. Return to your normal activities as told by your health care provider. Ask your health care provider what activities are safe for you. Managing cramping and bloating  Try walking around when you have cramps or feel bloated. If directed, apply heat to your abdomen as told by your health care provider. Use the heat source that your health care provider recommends, such as a moist heat pack or a heating pad. Place a towel between your skin and the heat source. Leave the heat on for 20-30 minutes. Remove the heat if your skin turns bright red. This is especially important if you are unable to feel pain, heat, or cold. You have a greater risk of getting burned. General instructions If you were given a sedative  during the procedure, it can affect you for several hours. Do not drive or operate machinery until your health care provider says that it is safe. For the first 24 hours after the procedure: Do not sign important documents. Do not drink alcohol. Do your regular daily activities at a slower pace than normal. Eat soft foods that are easy to digest. Take over-the-counter and prescription medicines only as told by your health care provider. Keep all follow-up visits. This is important. Contact a health care provider if: You have blood in your stool 2-3 days after the procedure. Get help right away if: You have more than a small spotting of blood in your  stool. You have large blood clots in your stool. You have swelling of your abdomen. You have nausea or vomiting. You have a fever. You have increasing pain in your abdomen that is not relieved with medicine. These symptoms may be an emergency. Get help right away. Call 911. Do not wait to see if the symptoms will go away. Do not drive yourself to the hospital. Summary After the procedure, it is common to have a small amount of blood in your stool. You may also have mild cramping and bloating of your abdomen. If you were given a sedative during the procedure, it can affect you for several hours. Do not drive or operate machinery until your health care provider says that it is safe. Get help right away if you have a lot of blood in your stool, nausea or vomiting, a fever, or increased pain in your abdomen. This information is not intended to replace advice given to you by your health care provider. Make sure you discuss any questions you have with your health care provider. Document Revised: 04/24/2021 Document Reviewed: 04/24/2021 Elsevier Patient Education  [place] After This sheet gives you information about how to care for yourself after your procedure. Your health care provider may also give you more specific instructions. If you have problems or questions, contact your health care provider. What can I expect after the procedure? After the procedure, it is common to have: Tiredness. Forgetfulness about what happened after the procedure. Impaired judgment for important decisions. Nausea or vomiting. Some difficulty with balance. Follow these instructions at home: For the time period you were told by your health care provider:     Rest as needed. Do not participate in activities where you could fall or become injured. Do not drive or use machinery. Do not drink alcohol. Do not take sleeping pills or medicines that cause drowsiness. Do  not make important decisions or sign legal documents. Do not take care of children on your own. Eating and drinking Follow the diet that is recommended by your health care provider. Drink enough fluid to keep your urine pale yellow. If you vomit: Drink water, juice, or soup when you can drink without vomiting. Make sure you have little or no nausea before eating solid foods. General instructions Have a responsible adult stay with you for the time you are told. It is important to have someone help care for you until you are awake and alert. Take over-the-counter and prescription medicines only as told by your health care provider. If you have sleep apnea, surgery and certain medicines can increase your risk for breathing problems. Follow instructions from your health care provider about wearing your sleep device: Anytime you are sleeping, including during daytime naps. While taking prescription pain medicines, sleeping medicines, or medicines that  make you drowsy. Avoid smoking. Keep all follow-up visits as told by your health care provider. This is important. Contact a health care provider if: You keep feeling nauseous or you keep vomiting. You feel light-headed. You are still sleepy or having trouble with balance after 24 hours. You develop a rash. You have a fever. You have redness or swelling around the IV site. Get help right away if: You have trouble breathing. You have new-onset confusion at home. Summary For several hours after your procedure, you may feel tired. You may also be forgetful and have poor judgment. Have a responsible adult stay with you for the time you are told. It is important to have someone help care for you until you are awake and alert. Rest as told. Do not drive or operate machinery. Do not drink alcohol or take sleeping pills. Get help right away if you have trouble breathing, or if you suddenly become confused. This information is not intended to replace  advice given to you by your health care provider. Make sure you discuss any questions you have with your health care provider. Document Revised: 08/06/2021 Document Reviewed: 08/04/2019 Elsevier Patient Education  [place].

## 2022-03-24 ENCOUNTER — Other Ambulatory Visit: Payer: Self-pay | Admitting: *Deleted

## 2022-03-24 ENCOUNTER — Encounter (HOSPITAL_COMMUNITY)
Admission: RE | Admit: 2022-03-24 | Discharge: 2022-03-24 | Disposition: A | Discharge status: Home / Self Care | Payer: Medicare Other | Source: Ambulatory Visit | Attending: Internal Medicine | Admitting: Internal Medicine

## 2022-03-24 DIAGNOSIS — E119 Type 2 diabetes mellitus without complications: Secondary | ICD-10-CM | POA: Insufficient documentation

## 2022-03-24 DIAGNOSIS — K7469 Other cirrhosis of liver: Secondary | ICD-10-CM | POA: Insufficient documentation

## 2022-03-24 DIAGNOSIS — D649 Anemia, unspecified: Secondary | ICD-10-CM

## 2022-03-24 DIAGNOSIS — D500 Iron deficiency anemia secondary to blood loss (chronic): Secondary | ICD-10-CM | POA: Diagnosis not present

## 2022-03-24 DIAGNOSIS — Z01812 Encounter for preprocedural laboratory examination: Secondary | ICD-10-CM | POA: Insufficient documentation

## 2022-03-24 DIAGNOSIS — D696 Thrombocytopenia, unspecified: Secondary | ICD-10-CM | POA: Diagnosis not present

## 2022-03-24 LAB — COMPREHENSIVE METABOLIC PANEL
ALT: 19 U/L (ref 0–44)
AST: 27 U/L (ref 15–41)
Albumin: 3.3 g/dL — ABNORMAL LOW (ref 3.5–5.0)
Alkaline Phosphatase: 80 U/L (ref 38–126)
Anion gap: 12 (ref 5–15)
BUN: 20 mg/dL (ref 8–23)
CO2: 22 mmol/L (ref 22–32)
Calcium: 9.1 mg/dL (ref 8.9–10.3)
Chloride: 101 mmol/L (ref 98–111)
Creatinine, Ser: 1.04 mg/dL (ref 0.61–1.24)
GFR, Estimated: >60 mL/min (ref >60)
Glucose, Bld: 164 mg/dL — ABNORMAL HIGH (ref 70–99)
Potassium: 4.9 mmol/L (ref 3.5–5.1)
Sodium: 135 mmol/L (ref 135–145)
Total Bilirubin: 0.8 mg/dL (ref 0.3–1.2)
Total Protein: 6.1 g/dL — ABNORMAL LOW (ref 6.5–8.1)

## 2022-03-25 ENCOUNTER — Other Ambulatory Visit (HOSPITAL_COMMUNITY): Payer: Medicare Other

## 2022-03-26 ENCOUNTER — Ambulatory Visit (HOSPITAL_COMMUNITY): Payer: Medicare Other | Admitting: Anesthesiology

## 2022-03-26 ENCOUNTER — Encounter (HOSPITAL_COMMUNITY): Payer: Self-pay | Admitting: Internal Medicine

## 2022-03-26 ENCOUNTER — Ambulatory Visit (HOSPITAL_COMMUNITY)
Admission: RE | Admit: 2022-03-26 | Discharge: 2022-03-26 | Disposition: A | Discharge status: Home / Self Care | Payer: Medicare Other | Attending: Internal Medicine | Admitting: Internal Medicine

## 2022-03-26 ENCOUNTER — Ambulatory Visit (HOSPITAL_BASED_OUTPATIENT_CLINIC_OR_DEPARTMENT_OTHER): Payer: Medicare Other | Admitting: Anesthesiology

## 2022-03-26 ENCOUNTER — Encounter (HOSPITAL_COMMUNITY)
Admission: RE | Disposition: A | Discharge status: Home / Self Care | Payer: Self-pay | Source: Home / Self Care | Attending: Internal Medicine

## 2022-03-26 DIAGNOSIS — K219 Gastro-esophageal reflux disease without esophagitis: Secondary | ICD-10-CM | POA: Diagnosis not present

## 2022-03-26 DIAGNOSIS — E119 Type 2 diabetes mellitus without complications: Secondary | ICD-10-CM | POA: Insufficient documentation

## 2022-03-26 DIAGNOSIS — K5521 Angiodysplasia of colon with hemorrhage: Secondary | ICD-10-CM | POA: Diagnosis not present

## 2022-03-26 DIAGNOSIS — D509 Iron deficiency anemia, unspecified: Secondary | ICD-10-CM

## 2022-03-26 DIAGNOSIS — K7581 Nonalcoholic steatohepatitis (NASH): Secondary | ICD-10-CM | POA: Diagnosis not present

## 2022-03-26 DIAGNOSIS — K7469 Other cirrhosis of liver: Secondary | ICD-10-CM

## 2022-03-26 DIAGNOSIS — Z87891 Personal history of nicotine dependence: Secondary | ICD-10-CM | POA: Insufficient documentation

## 2022-03-26 DIAGNOSIS — Z7984 Long term (current) use of oral hypoglycemic drugs: Secondary | ICD-10-CM | POA: Diagnosis not present

## 2022-03-26 DIAGNOSIS — K7460 Unspecified cirrhosis of liver: Secondary | ICD-10-CM | POA: Diagnosis not present

## 2022-03-26 DIAGNOSIS — Z79899 Other long term (current) drug therapy: Secondary | ICD-10-CM | POA: Diagnosis not present

## 2022-03-26 DIAGNOSIS — I480 Paroxysmal atrial fibrillation: Secondary | ICD-10-CM | POA: Diagnosis not present

## 2022-03-26 DIAGNOSIS — I10 Essential (primary) hypertension: Secondary | ICD-10-CM | POA: Insufficient documentation

## 2022-03-26 DIAGNOSIS — D696 Thrombocytopenia, unspecified: Secondary | ICD-10-CM

## 2022-03-26 DIAGNOSIS — D500 Iron deficiency anemia secondary to blood loss (chronic): Secondary | ICD-10-CM

## 2022-03-26 HISTORY — PX: COLONOSCOPY WITH PROPOFOL: SHX5780

## 2022-03-26 HISTORY — PX: HOT HEMOSTASIS: SHX5433

## 2022-03-26 LAB — CBC
HCT: 27.5 % — ABNORMAL LOW (ref 39.0–52.0)
Hemoglobin: 8.4 g/dL — ABNORMAL LOW (ref 13.0–17.0)
MCH: 27.5 pg (ref 26.0–34.0)
MCHC: 30.5 g/dL (ref 30.0–36.0)
MCV: 90.2 fL (ref 80.0–100.0)
Platelets: 130 10*3/uL — ABNORMAL LOW (ref 150–400)
RBC: 3.05 MIL/uL — ABNORMAL LOW (ref 4.22–5.81)
RDW: 14.6 % (ref 11.5–15.5)
WBC: 3.5 10*3/uL — ABNORMAL LOW (ref 4.0–10.5)
nRBC: 0 % (ref 0.0–0.2)

## 2022-03-26 LAB — PROTIME-INR
INR: 1.1 (ref 0.8–1.2)
Prothrombin Time: 14.4 seconds (ref 11.4–15.2)

## 2022-03-26 SURGERY — COLONOSCOPY WITH PROPOFOL
Anesthesia: General

## 2022-03-26 MED ORDER — PROPOFOL 10 MG/ML IV BOLUS
INTRAVENOUS | Status: DC | PRN
  Administered 2022-03-26: 80 mg via INTRAVENOUS

## 2022-03-26 MED ORDER — EPHEDRINE SULFATE-NACL 50-0.9 MG/10ML-% IV SOSY
PREFILLED_SYRINGE | INTRAVENOUS | Status: DC | PRN
  Administered 2022-03-26: 10 mg via INTRAVENOUS

## 2022-03-26 MED ORDER — PROPOFOL 500 MG/50ML IV EMUL
INTRAVENOUS | Status: DC | PRN
  Administered 2022-03-26: 200 ug/kg/min via INTRAVENOUS

## 2022-03-26 MED ORDER — LIDOCAINE 2% (20 MG/ML) 5 ML SYRINGE
INTRAMUSCULAR | Status: DC | PRN
  Administered 2022-03-26: 50 mg via INTRAVENOUS

## 2022-03-26 MED ORDER — LACTATED RINGERS IV SOLN: INTRAVENOUS | Status: DC

## 2022-03-26 NOTE — Discharge Instructions (Addendum)
  Colonoscopy Discharge Instructions  Read the instructions outlined below and refer to this sheet in the next few weeks. These discharge instructions provide you with general information on caring for yourself after you leave the hospital. Your doctor may also give you specific instructions. While your treatment has been planned according to the most current medical practices available, unavoidable complications occasionally occur. If you have any problems or questions after discharge, call Dr. Gala Romney at [phone]. ACTIVITY You may resume your regular activity, but move at a slower pace for the next 24 hours.  Take frequent rest periods for the next 24 hours.  Walking will help get rid of the air and reduce the bloated feeling in your belly (abdomen).  No driving for 24 hours (because of the medicine (anesthesia) used during the test).   Do not sign any important legal documents or operate any machinery for 24 hours (because of the anesthesia used during the test).  NUTRITION Drink plenty of fluids.  You may resume your normal diet as instructed by your doctor.  Begin with a light meal and progress to your normal diet. Heavy or fried foods are harder to digest and may make you feel sick to your stomach (nauseated).  Avoid alcoholic beverages for 24 hours or as instructed.  MEDICATIONS You may resume your normal medications unless your doctor tells you otherwise.  WHAT YOU CAN EXPECT TODAY Some feelings of bloating in the abdomen.  Passage of more gas than usual.  Spotting of blood in your stool or on the toilet paper.  IF YOU HAD POLYPS REMOVED DURING THE COLONOSCOPY: No aspirin products for 7 days or as instructed.  No alcohol for 7 days or as instructed.  Eat a soft diet for the next 24 hours.  FINDING OUT THE RESULTS OF YOUR TEST Not all test results are available during your visit. If your test results are not back during the visit, make an appointment with your caregiver to find out the  results. Do not assume everything is normal if you have not heard from your caregiver or the medical facility. It is important for you to follow up on all of your test results.  SEEK IMMEDIATE MEDICAL ATTENTION IF: You have more than a spotting of blood in your stool.  Your belly is swollen (abdominal distention).  You are nauseated or vomiting.  You have a temperature over 101.  You have abdominal pain or discomfort that is severe or gets worse throughout the day.     you had bleeding AVMs or blood blisters in your colon.  They were coagulated and clipped.  Clips will fall off on their own in your bowel movement.  No MRI until clips are gone  Resume Xarelto and iron tomorrow  Office visit with Roseanne Kaufman in 6 weeks  in our officeif not already scheduled   at patient request, I called Autumn Patty at [phone] and reviewed results.

## 2022-03-26 NOTE — Anesthesia Preprocedure Evaluation (Signed)
Anesthesia Evaluation  Patient identified by MRN, date of birth, ID band Patient awake    Reviewed: Allergy & Precautions, H&P , NPO status , Patient's Chart, lab work & pertinent test results, reviewed documented beta blocker date and time   Airway Mallampati: II  TM Distance: >3 FB Neck ROM: full    Dental no notable dental hx. (+) Loose,    Pulmonary neg pulmonary ROS, former smoker,    Pulmonary exam normal breath sounds clear to auscultation       Cardiovascular Exercise Tolerance: Good hypertension, negative cardio ROS  Atrial Fibrillation  Rhythm:regular Rate:Normal     Neuro/Psych negative neurological ROS  negative psych ROS   GI/Hepatic Neg liver ROS, GERD  Medicated,  Endo/Other  negative endocrine ROSdiabetes, Type 2  Renal/GU negative Renal ROS  negative genitourinary   Musculoskeletal negative musculoskeletal ROS (+)   Abdominal   Peds  Hematology negative hematology ROS (+) anemia ,   Anesthesia Other Findings   Reproductive/Obstetrics negative OB ROS                             Anesthesia Physical  Anesthesia Plan  ASA: 2  Anesthesia Plan: General   Post-op Pain Management:    Induction:   PONV Risk Score and Plan: Propofol infusion and TIVA  Airway Management Planned:   Additional Equipment:   Intra-op Plan:   Post-operative Plan:   Informed Consent: I have reviewed the patients History and Physical, chart, labs and discussed the procedure including the risks, benefits and alternatives for the proposed anesthesia with the patient or authorized representative who has indicated his/her understanding and acceptance.     Dental Advisory Given  Plan Discussed with: CRNA  Anesthesia Plan Comments:         Anesthesia Quick Evaluation

## 2022-03-26 NOTE — Interval H&P Note (Signed)
History and Physical Interval Note:  03/26/2022 3:21 PM  Derek Drake  has presented today for surgery, with the diagnosis of IDA.  The various methods of treatment have been discussed with the patient and family. After consideration of risks, benefits and other options for treatment, the patient has consented to  Procedure(s) with comments: COLONOSCOPY WITH PROPOFOL (N/A) - 7:30am, moved to 7/12 @ 2:45 as a surgical intervention.  The patient's history has been reviewed, patient examined, no change in status, stable for surgery.  I have reviewed the patient's chart and labs.  Questions were answered to the patient's satisfaction.     Derek Drake  No change.  Hemoglobin up into the 8 range after 1 unit transfusion last week.  No overt GI bleeding.  Hemoccult negative recently.  Diagnostic colonoscopy today per plan. The risks, benefits, limitations, alternatives and imponderables have been reviewed with the patient. Questions have been answered. All parties are agreeable.

## 2022-03-26 NOTE — Anesthesia Postprocedure Evaluation (Signed)
Anesthesia Post Note  Patient: Derek Drake  Procedure(s) Performed: COLONOSCOPY WITH PROPOFOL HOT HEMOSTASIS (ARGON PLASMA COAGULATION/BICAP)  Patient location during evaluation: Phase II Anesthesia Type: General Level of consciousness: awake and alert Pain management: pain level controlled Vital Signs Assessment: post-procedure vital signs reviewed and stable Respiratory status: spontaneous breathing, nonlabored ventilation, respiratory function stable and patient connected to nasal cannula oxygen Cardiovascular status: blood pressure returned to baseline and stable Postop Assessment: no apparent nausea or vomiting Anesthetic complications: no   There were no known notable events for this encounter.   Last Vitals:  Vitals:   03/26/22 1343 03/26/22 1558  BP: 133/61 (!) 92/39  Pulse: (!) 59 61  Resp: 16 10  Temp: 36.6 C 36.7 C  SpO2: 99% 99%    Last Pain:  Vitals:   03/26/22 1558  TempSrc: Oral  PainSc: 0-No pain                 Trixie Rude

## 2022-03-26 NOTE — Transfer of Care (Signed)
Immediate Anesthesia Transfer of Care Note  Patient: Derek Drake  Procedure(s) Performed: COLONOSCOPY WITH PROPOFOL HOT HEMOSTASIS (ARGON PLASMA COAGULATION/BICAP)  Patient Location: Endoscopy Unit  Anesthesia Type:MAC  Level of Consciousness: awake, alert , oriented and patient cooperative  Airway & Oxygen Therapy: Patient Spontanous Breathing and Patient connected to nasal cannula oxygen  Post-op Assessment: Report given to RN, Post -op Vital signs reviewed and stable and Patient moving all extremities  Post vital signs: Reviewed and stable  Last Vitals:  Vitals Value Taken Time  BP    Temp    Pulse    Resp    SpO2      Last Pain:  Vitals:   03/26/22 1535  TempSrc:   PainSc: 0-No pain         Complications: No notable events documented.

## 2022-03-26 NOTE — Op Note (Signed)
[facility] Patient Name: [name] Procedure Date: 03/26/2022 2:55 PM MRN: [id number] Date of Birth: [birth year] Attending MD: Norvel Richards , MD CSN: [id number] Age: 76 Admit Type: Outpatient Procedure:                Colonoscopy Indications:              Iron deficiency anemia Providers:                Norvel Richards, MD, Caprice Kluver, Everardo Pacific, Randa Spike, Technician Referring MD:              Medicines:                Propofol per Anesthesia Complications:            No immediate complications. Estimated Blood Loss:     Estimated blood loss was minimal. Procedure:                After obtaining informed consent, the colonoscope                            was passed under direct vision. Throughout the                            procedure, the patient's blood pressure, pulse, and                            oxygen saturations were monitored continuously. The                            [id number]) scope was introduced through                            the anus and advanced to the the ileocecal valve. Scope In: 3:32:13 PM Scope Out: 3:54:53 PM Scope Withdrawal Time: 0 hours 19 minutes 1 second  Total Procedure Duration: 0 hours 22 minutes 40 seconds  Findings:      The perianal and digital rectal examinations were normal. (1) 5 mm AVM       in the base the cecum which was found to be actively oozing when I       reached that area with the scope. There was a second 8 mm innocent       appearing AVM in the cecum as well. There was a relatively prominent       vascular pattern throughout the colon. The mucosa otherwise appeared       normal. The distal 30 cm of terminal ileum appeared normal.      The bleeding lesion was sealed with APC circular probe multiple       applications of 20 J each. Clip was placed with good hemostasis being       achieved. The nonbleeding AVM was ablated with APC circular probe 20 J       each  multiple applications. It too was clipped. Good hemostasis       maintained. Impression:               - Cecal AVMs 1 actively  bleeding as described above                            ablated and clipped. Relatively prominent vascular                            pattern throughout the colon. Likely secondary to                            cirrhosis. Moderate Sedation:      Moderate (conscious) sedation was personally administered by an       anesthesia professional. The following parameters were monitored: oxygen       saturation, heart rate, blood pressure, respiratory rate, EKG, adequacy       of pulmonary ventilation, and response to care. Recommendation:           - Patient has a contact number available for                            emergencies. The signs and symptoms of potential                            delayed complications were discussed with the                            patient. Return to normal activities tomorrow.                            Written discharge instructions were provided to the                            patient.                           - Advance diet as tolerated. No MRI until clips                            gone. Trend H&H. Resume Xarelto and iron tomorrow.                            Office visit with [place] in 6 weeks Procedure Code(s):        --- Professional ---                           [phone], Colonoscopy, flexible; diagnostic, including                            collection of specimen(s) by brushing or washing,                            when performed (separate procedure) Diagnosis Code(s):        --- Professional ---                           D50.9, Iron deficiency anemia, unspecified CPT copyright 2019 American Medical Association. All rights  reserved. The codes documented in this report are preliminary and upon coder review may  be revised to meet current compliance requirements. Cristopher Estimable. Elena Cothern, MD Norvel Richards, MD 03/26/2022 4:16:16  PM This report has been signed electronically. Number of Addenda: 0

## 2022-03-27 LAB — HEMOGLOBIN: Hemoglobin: 7.2 g/dL — ABNORMAL LOW (ref 13.2–17.1)

## 2022-03-28 ENCOUNTER — Other Ambulatory Visit: Payer: Self-pay | Admitting: Gastroenterology

## 2022-03-28 DIAGNOSIS — D649 Anemia, unspecified: Secondary | ICD-10-CM

## 2022-03-31 ENCOUNTER — Other Ambulatory Visit (HOSPITAL_COMMUNITY): Payer: Medicare Other

## 2022-04-01 ENCOUNTER — Encounter (HOSPITAL_COMMUNITY): Payer: Self-pay | Admitting: Internal Medicine

## 2022-04-01 LAB — CBC
HCT: 25.1 % — ABNORMAL LOW (ref 38.5–50.0)
Hemoglobin: 7.5 g/dL — ABNORMAL LOW (ref 13.2–17.1)
MCH: 27 pg (ref 27.0–33.0)
MCHC: 29.9 g/dL — ABNORMAL LOW (ref 32.0–36.0)
MCV: 90.3 fL (ref 80.0–100.0)
MPV: 10.4 fL (ref 7.5–12.5)
Platelets: 110 10*3/uL — ABNORMAL LOW (ref 140–400)
RBC: 2.78 10*6/uL — ABNORMAL LOW (ref 4.20–5.80)
RDW: 14.2 % (ref 11.0–15.0)
WBC: 2.8 10*3/uL — ABNORMAL LOW (ref 3.8–10.8)

## 2022-04-02 ENCOUNTER — Other Ambulatory Visit: Payer: Self-pay | Admitting: *Deleted

## 2022-04-02 DIAGNOSIS — D649 Anemia, unspecified: Secondary | ICD-10-CM

## 2022-04-11 ENCOUNTER — Other Ambulatory Visit: Payer: Self-pay

## 2022-04-11 ENCOUNTER — Encounter (HOSPITAL_COMMUNITY): Payer: Self-pay

## 2022-04-11 ENCOUNTER — Inpatient Hospital Stay (HOSPITAL_COMMUNITY)
Admission: EM | Admit: 2022-04-11 | Discharge: 2022-04-14 | DRG: 378 | Disposition: A | Discharge status: Home / Self Care | Payer: Medicare Other | Attending: Internal Medicine | Admitting: Internal Medicine

## 2022-04-11 DIAGNOSIS — I10 Essential (primary) hypertension: Secondary | ICD-10-CM | POA: Diagnosis not present

## 2022-04-11 DIAGNOSIS — K922 Gastrointestinal hemorrhage, unspecified: Principal | ICD-10-CM

## 2022-04-11 DIAGNOSIS — E1169 Type 2 diabetes mellitus with other specified complication: Secondary | ICD-10-CM

## 2022-04-11 DIAGNOSIS — K766 Portal hypertension: Secondary | ICD-10-CM | POA: Diagnosis present

## 2022-04-11 DIAGNOSIS — K7581 Nonalcoholic steatohepatitis (NASH): Secondary | ICD-10-CM | POA: Diagnosis present

## 2022-04-11 DIAGNOSIS — K633 Ulcer of intestine: Secondary | ICD-10-CM | POA: Diagnosis present

## 2022-04-11 DIAGNOSIS — Z8719 Personal history of other diseases of the digestive system: Secondary | ICD-10-CM

## 2022-04-11 DIAGNOSIS — E782 Mixed hyperlipidemia: Secondary | ICD-10-CM

## 2022-04-11 DIAGNOSIS — Z87891 Personal history of nicotine dependence: Secondary | ICD-10-CM

## 2022-04-11 DIAGNOSIS — K648 Other hemorrhoids: Secondary | ICD-10-CM | POA: Diagnosis present

## 2022-04-11 DIAGNOSIS — D61818 Other pancytopenia: Secondary | ICD-10-CM | POA: Diagnosis present

## 2022-04-11 DIAGNOSIS — D649 Anemia, unspecified: Secondary | ICD-10-CM | POA: Diagnosis present

## 2022-04-11 DIAGNOSIS — E119 Type 2 diabetes mellitus without complications: Secondary | ICD-10-CM

## 2022-04-11 DIAGNOSIS — D62 Acute posthemorrhagic anemia: Secondary | ICD-10-CM

## 2022-04-11 DIAGNOSIS — Z7901 Long term (current) use of anticoagulants: Secondary | ICD-10-CM

## 2022-04-11 DIAGNOSIS — Z8249 Family history of ischemic heart disease and other diseases of the circulatory system: Secondary | ICD-10-CM

## 2022-04-11 DIAGNOSIS — D696 Thrombocytopenia, unspecified: Secondary | ICD-10-CM | POA: Diagnosis present

## 2022-04-11 DIAGNOSIS — K3189 Other diseases of stomach and duodenum: Secondary | ICD-10-CM | POA: Diagnosis present

## 2022-04-11 DIAGNOSIS — H5350 Unspecified color vision deficiencies: Secondary | ICD-10-CM | POA: Diagnosis present

## 2022-04-11 DIAGNOSIS — Z885 Allergy status to narcotic agent status: Secondary | ICD-10-CM

## 2022-04-11 DIAGNOSIS — I8510 Secondary esophageal varices without bleeding: Secondary | ICD-10-CM | POA: Diagnosis present

## 2022-04-11 DIAGNOSIS — Z9049 Acquired absence of other specified parts of digestive tract: Secondary | ICD-10-CM

## 2022-04-11 DIAGNOSIS — K5520 Angiodysplasia of colon without hemorrhage: Secondary | ICD-10-CM | POA: Diagnosis present

## 2022-04-11 DIAGNOSIS — Z7984 Long term (current) use of oral hypoglycemic drugs: Secondary | ICD-10-CM

## 2022-04-11 DIAGNOSIS — Z79899 Other long term (current) drug therapy: Secondary | ICD-10-CM

## 2022-04-11 DIAGNOSIS — K219 Gastro-esophageal reflux disease without esophagitis: Secondary | ICD-10-CM | POA: Diagnosis present

## 2022-04-11 DIAGNOSIS — K7460 Unspecified cirrhosis of liver: Secondary | ICD-10-CM

## 2022-04-11 DIAGNOSIS — I480 Paroxysmal atrial fibrillation: Secondary | ICD-10-CM | POA: Diagnosis present

## 2022-04-11 DIAGNOSIS — Z91018 Allergy to other foods: Secondary | ICD-10-CM

## 2022-04-11 DIAGNOSIS — Z91041 Radiographic dye allergy status: Secondary | ICD-10-CM

## 2022-04-11 DIAGNOSIS — Z91048 Other nonmedicinal substance allergy status: Secondary | ICD-10-CM

## 2022-04-11 HISTORY — DX: Acute posthemorrhagic anemia: D62

## 2022-04-11 LAB — CBC WITH DIFFERENTIAL/PLATELET
Abs Immature Granulocytes: 0.03 10*3/uL (ref 0.00–0.07)
Absolute Monocytes: 527 cells/uL (ref 200–950)
Basophils Absolute: 9 cells/uL (ref 0–200)
Basophils Absolute: 0 10*3/uL (ref 0.0–0.1)
Basophils Relative: 0.3 %
Basophils Relative: 1 %
Eosinophils Absolute: 143 cells/uL (ref 15–500)
Eosinophils Absolute: 0.2 10*3/uL (ref 0.0–0.5)
Eosinophils Relative: 4.6 %
Eosinophils Relative: 4 %
HCT: 22 % — ABNORMAL LOW (ref 38.5–50.0)
HCT: 23.7 % — ABNORMAL LOW (ref 39.0–52.0)
Hemoglobin: 6.5 g/dL — ABNORMAL LOW (ref 13.2–17.1)
Hemoglobin: 7.1 g/dL — ABNORMAL LOW (ref 13.0–17.0)
Immature Granulocytes: 1 %
Lymphocytes Relative: 12 %
Lymphs Abs: 446 cells/uL — ABNORMAL LOW (ref 850–3900)
Lymphs Abs: 0.4 10*3/uL — ABNORMAL LOW (ref 0.7–4.0)
MCH: 27.1 pg (ref 27.0–33.0)
MCH: 27.6 pg (ref 26.0–34.0)
MCHC: 29.5 g/dL — ABNORMAL LOW (ref 32.0–36.0)
MCHC: 30 g/dL (ref 30.0–36.0)
MCV: 91.7 fL (ref 80.0–100.0)
MCV: 92.2 fL (ref 80.0–100.0)
MPV: 11 fL (ref 7.5–12.5)
Monocytes Absolute: 0.6 10*3/uL (ref 0.1–1.0)
Monocytes Relative: 17 %
Monocytes Relative: 16 %
Neutro Abs: 1975 cells/uL (ref 1500–7800)
Neutro Abs: 2.4 10*3/uL (ref 1.7–7.7)
Neutrophils Relative %: 63.7 %
Neutrophils Relative %: 66 %
Platelets: 91 10*3/uL — ABNORMAL LOW (ref 140–400)
Platelets: 111 10*3/uL — ABNORMAL LOW (ref 150–400)
RBC: 2.4 10*6/uL — ABNORMAL LOW (ref 4.20–5.80)
RBC: 2.57 MIL/uL — ABNORMAL LOW (ref 4.22–5.81)
RDW: 14.3 % (ref 11.0–15.0)
RDW: 15.9 % — ABNORMAL HIGH (ref 11.5–15.5)
Total Lymphocyte: 14.4 %
WBC: 3.1 10*3/uL — ABNORMAL LOW (ref 3.8–10.8)
WBC: 3.6 10*3/uL — ABNORMAL LOW (ref 4.0–10.5)
nRBC: 0 % (ref 0.0–0.2)

## 2022-04-11 LAB — RETICULOCYTES
Immature Retic Fract: 29.5 % — ABNORMAL HIGH (ref 2.3–15.9)
RBC.: 2.64 MIL/uL — ABNORMAL LOW (ref 4.22–5.81)
Retic Count, Absolute: 117 10*3/uL (ref 19.0–186.0)
Retic Ct Pct: 4.4 % — ABNORMAL HIGH (ref 0.4–3.1)

## 2022-04-11 LAB — IRON AND TIBC
Iron: 241 ug/dL — ABNORMAL HIGH (ref 45–182)
Saturation Ratios: 48 % — ABNORMAL HIGH (ref 17.9–39.5)
TIBC: 499 ug/dL — ABNORMAL HIGH (ref 250–450)
UIBC: 258 ug/dL

## 2022-04-11 LAB — BASIC METABOLIC PANEL
Anion gap: 7 (ref 5–15)
BUN: 17 mg/dL (ref 8–23)
CO2: 25 mmol/L (ref 22–32)
Calcium: 8.8 mg/dL — ABNORMAL LOW (ref 8.9–10.3)
Chloride: 103 mmol/L (ref 98–111)
Creatinine, Ser: 1.08 mg/dL (ref 0.61–1.24)
GFR, Estimated: >60 mL/min (ref >60)
Glucose, Bld: 249 mg/dL — ABNORMAL HIGH (ref 70–99)
Potassium: 4.9 mmol/L (ref 3.5–5.1)
Sodium: 135 mmol/L (ref 135–145)

## 2022-04-11 LAB — VITAMIN B12: Vitamin B-12: 593 pg/mL (ref 180–914)

## 2022-04-11 LAB — GLUCOSE, CAPILLARY: Glucose-Capillary: 127 mg/dL — ABNORMAL HIGH (ref 70–99)

## 2022-04-11 LAB — FERRITIN: Ferritin: 12 ng/mL — ABNORMAL LOW (ref 24–336)

## 2022-04-11 LAB — PREPARE RBC (CROSSMATCH)

## 2022-04-11 LAB — FOLATE: Folate: 29.7 ng/mL (ref >5.9)

## 2022-04-11 MED ORDER — GABAPENTIN 100 MG PO CAPS
100.0000 mg | ORAL_CAPSULE | Freq: Every day | ORAL | Status: DC
  Administered 2022-04-11 – 2022-04-13 (×3): 100 mg via ORAL
  Filled 2022-04-11 (×3): qty 1

## 2022-04-11 MED ORDER — NADOLOL 20 MG PO TABS
20.0000 mg | ORAL_TABLET | Freq: Every day | ORAL | Status: DC
  Administered 2022-04-12 – 2022-04-14 (×3): 20 mg via ORAL
  Filled 2022-04-11 (×5): qty 1

## 2022-04-11 MED ORDER — ACETAMINOPHEN 650 MG RE SUPP: 650.0000 mg | Freq: Four times a day (QID) | RECTAL | Status: DC | PRN

## 2022-04-11 MED ORDER — ACETAMINOPHEN 325 MG PO TABS: 650.0000 mg | ORAL_TABLET | Freq: Four times a day (QID) | ORAL | Status: DC | PRN

## 2022-04-11 MED ORDER — PANTOPRAZOLE SODIUM 40 MG PO TBEC
40.0000 mg | DELAYED_RELEASE_TABLET | Freq: Every day | ORAL | Status: DC
  Administered 2022-04-12 – 2022-04-14 (×3): 40 mg via ORAL
  Filled 2022-04-11 (×3): qty 1

## 2022-04-11 MED ORDER — DILTIAZEM HCL ER COATED BEADS 180 MG PO CP24
360.0000 mg | ORAL_CAPSULE | Freq: Every day | ORAL | Status: DC
  Administered 2022-04-12 – 2022-04-14 (×3): 360 mg via ORAL
  Filled 2022-04-11 (×3): qty 2

## 2022-04-11 MED ORDER — ONDANSETRON HCL 4 MG/2ML IJ SOLN: 4.0000 mg | Freq: Four times a day (QID) | INTRAMUSCULAR | Status: DC | PRN

## 2022-04-11 MED ORDER — SODIUM CHLORIDE 0.9 % IV SOLN
10.0000 mL/h | Freq: Once | INTRAVENOUS | Status: AC
  Administered 2022-04-11: 10 mL/h via INTRAVENOUS

## 2022-04-11 MED ORDER — FERROUS SULFATE 325 (65 FE) MG PO TABS
325.0000 mg | ORAL_TABLET | Freq: Two times a day (BID) | ORAL | Status: DC
  Administered 2022-04-11 – 2022-04-14 (×6): 325 mg via ORAL
  Filled 2022-04-11 (×6): qty 1

## 2022-04-11 MED ORDER — SIMVASTATIN 20 MG PO TABS
40.0000 mg | ORAL_TABLET | Freq: Every evening | ORAL | Status: DC
  Administered 2022-04-11 – 2022-04-13 (×3): 40 mg via ORAL
  Filled 2022-04-11 (×3): qty 2

## 2022-04-11 MED ORDER — ONDANSETRON HCL 4 MG PO TABS: 4.0000 mg | ORAL_TABLET | Freq: Four times a day (QID) | ORAL | Status: DC | PRN

## 2022-04-11 NOTE — Assessment & Plan Note (Signed)
Continue Cardizem CD, nadolol Holding losartan>>will not restart BP remains controlled

## 2022-04-11 NOTE — Consult Note (Signed)
Gastroenterology Consult   Referring Provider: No ref. provider found Primary Care Physician:  Derek School, MD Primary Gastroenterologist:  Dr. Gala Drake  Patient ID: Derek Drake; [id number]; Sep 21, 1945   Admit date: 04/11/2022  LOS: 0 days   Date of Consultation: 04/11/2022  Reason for Consultation:  anemia, GI bleed  History of Present Illness   Derek Drake is a 76 y.o. year old male with history of Derek Drake cirrhosis, chronic GERD, on Eliquis for A-fib, found to have progressive anemia over the last several months with iron deficiency.  Patient presented to the ED at the recommendation of Derek Drake, [place] with our GI service as he had CBC yesterday revealing hemoglobin of 6.5.  He has history of prior transfusions and is in need of another.  GI consulted for further evaluation and management.  ED course: Labs - hemoglobin 7.1, MCV 92.2, platelets 111, iron 241, saturation 48%, ferritin 12, normal B12   Consult: Prior GI workup:  Patient had recent heme-negative stool and June 2023.  He has had downtrending hemoglobin since May 2023.  In May his hemoglobin was 8.2, previously 13.1 in November 2021.  Dr. 7.3 on 6/13 and then again to 6.6 on 6/27.  Most recent iron panel on 6/13 with iron 28, saturation 6%, ferritin 10.  During his last video visit on 03/03/2022 continue to complain of fatigue that is intermittent and nonprogressive.  He also denied abdominal pain, confusion, jaundice, mental status changes, or overt GI bleeding.  Colonoscopy 2018: One 4 mm polyp with benign mucosa in the descending colon, mucosal ulceration s/p biopsy, ileal erosions (Path revealed focal erosion).  EGD May 2023: 3 columns of grade 1-2 varices, portal gastropathy, normal duodenum and was started on nadolol.  He had hemoglobin of 6.6 on 6/27 that was 6.6 and was advised to go to the ED for blood transfusion, he received 1 unit PRBCs was advised to resume oral iron.  Follow-up visit on 03/20/2022 patient  reported feeling better after receiving blood transfusion and denies shortness of breath, weakness, presyncope, chest pain, palpitations or any overt GI bleeding.  Patient did report dark stools since being on iron.  He reported stable BPs at home on nadolol.  Colonoscopy date moved up.  Colonoscopy 03/26/2022: 5 mm AVM in the base of the cecum found to be actively oozing and was treated with APC therapy and clip placed with good hemostasis, 8 mm innocent appearing AVM in the cecum as well also treated with APC therapy and clipped, relatively prominent vascular pattern throughout the colon, 30 cm of terminal ileum appeared normal.  Hemoglobin 8.4 on day of procedure.  Multiple hemoglobin rechecks over the last couple of weeks were 7.2, 7.5, and dropped to 6.5 on 7/27.   Today:  Patient had breakfast at 730 this morning.  Patient reports that he got a call this morning about his hemoglobin and stated that he has been feeling fine since his colonoscopy.  He only noted one occurrence of shortness of breath when carrying a box up the stairs a couple of days ago.  He has been out working in the garden and picking his vegetables without any difficulty.  He denies any recent bleeding episodes.  He denies any abdominal pain, melena, hematochezia, nausea, vomiting, or chest pain.  He denies any constipation.  He has been noting some looser stools with about 4-6 a day usually in the mornings after he takes his metformin.  He does note some dark stools however he  has been taking oral iron.  He noted that when he stopped the iron prior to his colonoscopy his stools were normal brown color.  He denies any urinary issues.  He has been taking all medications as previously instructed.  Past Medical History:  Diagnosis Date   Cirrhosis ([place])    Immune to Hep A. Received Hep B vaccination in 2022.   Essential hypertension    GERD (gastroesophageal reflux disease)    Hypercholesteremia    PAF (paroxysmal atrial  fibrillation) ([place]) 05/04/2014   Type 2 diabetes mellitus [facility])     Past Surgical History:  Procedure Laterality Date   BIOPSY  02/05/2017   Procedure: BIOPSY;  Surgeon: Daneil Dolin, MD;  [facility];  Service: Endoscopy;;  gastric colon   CHOLECYSTECTOMY     COLONOSCOPY N/A 05/13/2013   Procedure: COLONOSCOPY;  Surgeon: Daneil Dolin, MD;  [facility];  Service: Endoscopy;  Laterality: N/A;  8:30 AM   COLONOSCOPY N/A 02/05/2017   Procedure: COLONOSCOPY;  Surgeon: Daneil Dolin, MD;  [facility];  Service: Endoscopy;  Laterality: N/A;   COLONOSCOPY WITH PROPOFOL N/A 03/26/2022   Procedure: COLONOSCOPY WITH PROPOFOL;  Surgeon: Daneil Dolin, MD;  [facility];  Service: Endoscopy;  Laterality: N/A;  7:30am, moved to 7/12 @ 2:45   ESOPHAGOGASTRODUODENOSCOPY N/A 02/05/2017   Procedure: ESOPHAGOGASTRODUODENOSCOPY (EGD);  Surgeon: Daneil Dolin, MD;  [facility];  Service: Endoscopy;  Laterality: N/A;  730 - moved to 12:00    ESOPHAGOGASTRODUODENOSCOPY N/A 07/04/2020   Procedure: ESOPHAGOGASTRODUODENOSCOPY (EGD);  Surgeon: Daneil Dolin, MD;  [facility];  Service: Endoscopy;  Laterality: N/A;  12:45pm   ESOPHAGOGASTRODUODENOSCOPY (EGD) WITH PROPOFOL N/A 01/23/2022   3 columns of grade 1-2 varices, portal gastropathy, normal duodenum. Begin Nadolol 20 mg daily.   GIVENS CAPSULE STUDY N/A 07/04/2020   Procedure: GIVENS CAPSULE STUDY;  Surgeon: Daneil Dolin, MD;  [facility];  Service: Endoscopy;  Laterality: N/A;  Elected not to place a capsule today   HOT HEMOSTASIS  03/26/2022   Procedure: HOT HEMOSTASIS (ARGON PLASMA COAGULATION/BICAP);  Surgeon: Daneil Dolin, MD;  [facility];  Service: Endoscopy;;   POLYPECTOMY  02/05/2017   Procedure: POLYPECTOMY;  Surgeon: Daneil Dolin, MD;  [facility];  Service: Endoscopy;;  colon    Prior to Admission medications    Medication Sig Start Date End Date Taking? Authorizing Provider  apixaban (ELIQUIS) 5 MG TABS tablet Take 1 tablet (5 mg total) by mouth 2 (two) times daily. 05/05/14   Derek Cota, MD  cholecalciferol (VITAMIN D3) 25 MCG (1000 UNIT) tablet Take 1,000 Units by mouth daily.    [provider]  [employer] IRON 325 (65 Fe) MG tablet Take 325 mg by mouth 2 (two) times daily. 03/21/22   [provider]  diltiazem (CARDIZEM CD) 360 MG 24 hr capsule Take 360 mg by mouth daily.     [provider]  ferrous sulfate 325 (65 FE) MG EC tablet Take 325 mg by mouth in the morning and at bedtime.     [provider]  gabapentin (NEURONTIN) 100 MG capsule Take 100 mg by mouth at bedtime. 07/07/19   [provider]  [name] 500 MG CAPS Take 500 mg by mouth daily.    [provider]  losartan (COZAAR) 100 MG tablet Take 100 mg by mouth daily.  [provider]  metFORMIN (GLUCOPHAGE) 500 MG tablet Take 1,000 mg by mouth 2 (two) times daily with a meal.    [provider]  milk thistle 175 MG tablet Take 175 mg by mouth daily.    [provider]  Multiple Vitamin (MULTIVITAMIN WITH MINERALS) TABS tablet Take 1 tablet by mouth daily.    [provider]  nadolol (CORGARD) 20 MG tablet Take 20 mg by mouth daily.    [provider]  pantoprazole (PROTONIX) 40 MG tablet TAKE 1 TABLET BY MOUTH EVERY DAY Patient taking differently: Take 40 mg by mouth daily. 06/21/21   Mahala Menghini, PA-C  simvastatin (ZOCOR) 40 MG tablet Take 40 mg by mouth every evening.     [provider]    Current Facility-Administered Medications  Medication Dose Route Frequency Provider Last Rate Last Admin   0.9 %  sodium chloride infusion  10 mL/hr Intravenous Once Noemi Chapel, MD       Current Outpatient Medications  Medication Sig Dispense Refill   apixaban (ELIQUIS) 5 MG TABS tablet Take 1 tablet (5 mg total) by mouth 2 (two)  times daily. 60 tablet 0   cholecalciferol (VITAMIN D3) 25 MCG (1000 UNIT) tablet Take 1,000 Units by mouth daily.     CVS IRON 325 (65 Fe) MG tablet Take 325 mg by mouth 2 (two) times daily.     diltiazem (CARDIZEM CD) 360 MG 24 hr capsule Take 360 mg by mouth daily.      ferrous sulfate 325 (65 FE) MG EC tablet Take 325 mg by mouth in the morning and at bedtime.      gabapentin (NEURONTIN) 100 MG capsule Take 100 mg by mouth at bedtime.     Krill Oil 500 MG CAPS Take 500 mg by mouth daily.     losartan (COZAAR) 100 MG tablet Take 100 mg by mouth daily.      metFORMIN (GLUCOPHAGE) 500 MG tablet Take 1,000 mg by mouth 2 (two) times daily with a meal.     milk thistle 175 MG tablet Take 175 mg by mouth daily.     Multiple Vitamin (MULTIVITAMIN WITH MINERALS) TABS tablet Take 1 tablet by mouth daily.     nadolol (CORGARD) 20 MG tablet Take 20 mg by mouth daily.     pantoprazole (PROTONIX) 40 MG tablet TAKE 1 TABLET BY MOUTH EVERY DAY (Patient taking differently: Take 40 mg by mouth daily.) 90 tablet 3   simvastatin (ZOCOR) 40 MG tablet Take 40 mg by mouth every evening.       Allergies as of 04/11/2022 - Review Complete 04/11/2022  Allergen Reaction Noted   Rabbit protein Swelling 05/13/2013   Hydrocodone-acetaminophen Rash 12/07/2015   Other Swelling and Rash 05/13/2013   Ivp dye [iodinated contrast media] Other (See Comments) 01/30/2017    Family History  Problem Relation Age of Onset   Prostate cancer Brother    Congestive Heart Failure Mother    Heart failure Brother    Colon cancer Neg Hx     Social History   Socioeconomic History   Marital status: Married    Spouse name: Not on file   Number of children: Not on file   Years of education: Not on file   Highest education level: Not on file  Occupational History   Not on file  Tobacco Use   Smoking status: Former    Packs/day: 2.00    Years: 10.00    Total pack years:  20.00    Types: Cigarettes    Start date:  05/25/1956    Quit date: 05/25/2009    Years since quitting: 12.8   Smokeless tobacco: Current    Types: Chew  Vaping Use   Vaping Use: Never used  Substance and Sexual Activity   Alcohol use: Not Currently   Drug use: No   Sexual activity: Not Currently  Other Topics Concern   Not on file  Social History Narrative   Not on file   Social Determinants of Health   Financial Resource Strain: Not on file  Food Insecurity: Not on file  Transportation Needs: Not on file  Physical Activity: Not on file  Stress: Not on file  Social Connections: Not on file  Intimate Partner Violence: Not on file     Review of Systems   Gen: Denies any fever, chills, loss of appetite, change in weight or weight loss CV: Denies chest pain, heart palpitations, syncope, edema  Resp: Denies shortness of breath with rest, cough, wheezing, coughing up blood, and pleurisy. GI: Denies vomiting blood, jaundice, and fecal incontinence.   Denies dysphagia or odynophagia. GU : Denies urinary burning, blood in urine, urinary frequency, and urinary incontinence. MS: Denies joint pain, limitation of movement, swelling, cramps, and atrophy.  Derm: Denies rash, itching, dry skin, hives. Psych: Denies depression, anxiety, memory loss, hallucinations, and confusion. Heme: Denies bruising or bleeding Neuro:  Denies any headaches, dizziness, paresthesias, shaking  Physical Exam   Vital Signs in last 24 hours: Temp:  [98 F (36.7 C)] 98 F (36.7 C) (07/28 0918) Pulse Rate:  [73] 73 (07/28 0918) Resp:  [17] 17 (07/28 0918) BP: (132)/(71) 132/71 (07/28 0918) SpO2:  [100 %] 100 % (07/28 0918) Weight:  [81.2 kg] 81.2 kg (07/28 0919)    General:   Alert,  Well-developed, well-nourished, pleasant and cooperative in NAD Head:  Normocephalic and atraumatic. Eyes:  Sclera clear, no icterus.   Conjunctiva pink. Ears:  Normal auditory acuity. Mouth:  No deformity or lesions, dentition normal. Lungs:  Clear throughout  to auscultation.   No wheezes, crackles, or rhonchi. No acute distress. Heart:  Regular rate and rhythm. Abdomen:  Soft, nontender and nondistended. No masses, hepatosplenomegaly or hernias noted. Normal bowel sounds, without guarding, and without rebound.   Rectal: deferred   Msk:  Symmetrical without gross deformities. Normal posture. Extremities:  Without clubbing or edema. Neurologic:  Alert and  oriented x4. Skin:  Intact without significant lesions or rashes. Psych:  Alert and cooperative. Normal mood and affect.  Intake/Output from previous day: No intake/output data recorded. Intake/Output this shift: No intake/output data recorded.   Labs/Studies   Recent Labs Recent Labs    04/10/22 0915 04/11/22 0921  WBC 3.1* 3.6*  HGB 6.5* 7.1*  HCT 22.0* 23.7*  PLT 91* 111*   BMET Recent Labs    04/11/22 0921  NA 135  K 4.9  CL 103  CO2 25  GLUCOSE 249*  BUN 17  CREATININE 1.08  CALCIUM 8.8*   LFT No results for input(s): "PROT", "ALBUMIN", "AST", "ALT", "ALKPHOS", "BILITOT", "BILIDIR", "IBILI" in the last 72 hours. PT/INR No results for input(s): "LABPROT", "INR" in the last 72 hours. Hepatitis Panel No results for input(s): "HEPBSAG", "HCVAB", "HEPAIGM", "HEPBIGM" in the last 72 hours. C-Diff No results for input(s): "CDIFFTOX" in the last 72 hours.  Radiology/Studies No results found.   Assessment   Derek Drake is a 76 y.o. year old male with history of Derek Drake  cirrhosis, chronic GERD, A-fib on Eliquis, and IDA likely secondary to cirrhosis and known AVMs who presented to the ED at the recommendation of our GI practice due to hemoglobin of 6.5 on outpatient labs and recommendation for blood transfusion.  Iron deficiency anemia: Patient presents to the ED with recent hemoglobin of 6.5 on outpatient basis.  Recheck today reveals hemoglobin of 7.1.  Patient previously received blood transfusion on 6/27 for hemoglobin of 6.6.  Patient is at EGD and colonoscopy  this year.  EGD in May 2023 with grade 1/2 varices and portal gastropathy.  Recent colonoscopy on 7/12 with 2 cecal AVMs 1 with active bleeding, s/p clip placement x2 and APC therapy.  Repeat anemia panel with iron 241, iron sat 48%, and ferritin 12, he has been taking oral iron given twice daily at home.  He denies any shortness of breath, chest pain, melena, hematochezia, abdominal pain, or epistaxis.  Given that his hemoglobin is not less than 7 we will hold off on transfusion for now. He continues to have a drift down his hemoglobin therefore in order to complete work-up he will need capsule study to further evaluate his small bowel.   Plan / Recommendations   Givens capsule tomorrow Clear liquids this afternoon NPO at midnight Continue to monitor H/H, transfuse for hemoglobin less than 7.  Goal hemoglobin 7-8 given cirrhosis and presence of esophageal varices Continue PPI Continue nadolol Avoid all NSAIDs No medications 2 hours prior to swallowing pill cam.    04/11/2022, 11:21 AM  Venetia Night, MSN, FNP-BC, AGACNP-BC [facility]

## 2022-04-11 NOTE — ED Provider Notes (Signed)
[facility] EMERGENCY DEPARTMENT Provider Note   CSN: [id number] Arrival date & time: 04/11/22  5329     History  Chief Complaint  Patient presents with   Abnormal Lab    Derek Drake is a 76 y.o. male.   Abnormal Lab  This patient is a 76 year old male, he is currently on several different medications including Zocor for his high cholesterol, pantoprazole, he takes nadolol, metformin, losartan, gabapentin, diltiazem and Eliquis.  Review of the medical record shows that he has been seen in the GI offices and has recently had colonoscopy, he has a history of nonalcoholic steatohepatitis, chronic acid reflux and is on Eliquis for atrial fibrillation.  He has had progressive anemia over the past few months with iron deficiency anemia.  He had dropped down to 7.3, received a unit of packed red blood cells and progressed to colonoscopy.  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  These areas were clipped, the patient has not seen blood in his stool but states that he is color blind.  He denies abdominal pain, denies lightheadedness, denies shortness of breath, denies weakness but was called yesterday by gastroenterology and told that he needed to come to the hospital for a blood transfusion since he was low again.  A CBC performed yesterday showed that his hemoglobin had dropped to 6.5, 11 days prior it was 7.5.  His MCV was 91.7, platelets at 91,000    Home Medications Prior to Admission medications   Medication Sig Start Date End Date Taking? Authorizing Provider  apixaban (ELIQUIS) 5 MG TABS tablet Take 1 tablet (5 mg total) by mouth 2 (two) times daily. 05/05/14   [name], MD   cholecalciferol (VITAMIN D3) 25 MCG (1000 UNIT) tablet Take 1,000 Units by mouth daily.    [provider]  diltiazem (CARDIZEM CD) 360 MG 24 hr capsule Take 360 mg by mouth daily.     [provider]  ferrous sulfate 325 (65 FE) MG EC tablet Take 325 mg by mouth in the morning and at bedtime.     [provider]  gabapentin (NEURONTIN) 100 MG capsule Take 100 mg by mouth at bedtime. 07/07/19   [provider]  [name] 500 MG CAPS Take 500 mg by mouth daily.    [provider]  losartan (COZAAR) 100 MG tablet Take 100 mg by mouth daily.     [provider]  metFORMIN (GLUCOPHAGE) 500 MG tablet Take 1,000 mg by mouth 2 (two) times daily with a meal.    [provider]  milk thistle 175 MG tablet Take 175 mg by mouth daily.    [provider]  Multiple Vitamin (MULTIVITAMIN WITH MINERALS) TABS tablet Take 1 tablet by mouth daily.    [provider]  nadolol (CORGARD) 20 MG tablet Take 20 mg by mouth daily.    [provider]  pantoprazole (PROTONIX) 40 MG tablet TAKE 1 TABLET BY MOUTH EVERY DAY Patient taking differently: Take 40 mg by mouth daily. 06/21/21   Mahala Menghini, PA-C  simvastatin (ZOCOR) 40 MG tablet Take 40 mg by mouth every evening.     [provider]  Allergies    Rabbit protein, Hydrocodone-acetaminophen, Other, and Ivp dye [iodinated contrast media]    Review of Systems   Review of Systems  All other systems reviewed and are negative.   Physical Exam Updated Vital Signs BP 132/71 (BP Location: Right Arm)   Pulse 73   Temp 98 F (36.7 C) (Oral)   Resp 17   Ht 1.803 m ('5\' 11"'$ )   Wt 81.2 kg   SpO2 100%   BMI 24.97 kg/m  Physical Exam Vitals and nursing note reviewed.  Constitutional:      General: He is not in acute distress.    Appearance: He is well-developed.  HENT:     Head: Normocephalic and atraumatic.     Mouth/Throat:     Mouth: Mucous membranes  are moist.     Pharynx: No oropharyngeal exudate.  Eyes:     General: No scleral icterus.       Right eye: No discharge.        Left eye: No discharge.     Pupils: Pupils are equal, round, and reactive to light.     Comments: Pale conjunctive a  Neck:     Thyroid: No thyromegaly.     Vascular: No JVD.  Cardiovascular:     Rate and Rhythm: Normal rate and regular rhythm.     Heart sounds: Normal heart sounds. No murmur heard.    No friction rub. No gallop.  Pulmonary:     Effort: Pulmonary effort is normal. No respiratory distress.     Breath sounds: Normal breath sounds. No wheezing or rales.  Abdominal:     General: Bowel sounds are normal. There is no distension.     Palpations: Abdomen is soft. There is no mass.     Tenderness: There is no abdominal tenderness.  Musculoskeletal:        General: No tenderness. Normal range of motion.     Cervical back: Normal range of motion and neck supple.     Right lower leg: No edema.     Left lower leg: No edema.  Lymphadenopathy:     Cervical: No cervical adenopathy.  Skin:    General: Skin is warm and dry.     Findings: No erythema or rash.  Neurological:     Mental Status: He is alert.     Coordination: Coordination normal.  Psychiatric:        Behavior: Behavior normal.     ED Results / Procedures / Treatments   Labs (all labs ordered are listed, but only abnormal results are displayed) Labs Reviewed  IRON AND TIBC - Abnormal; Notable for the following components:      Result Value   Iron 241 (*)    TIBC 499 (*)    Saturation Ratios 48 (*)    All other components within normal limits  FERRITIN - Abnormal; Notable for the following components:   Ferritin 12 (*)    All other components within normal limits  RETICULOCYTES - Abnormal; Notable for the following components:   Retic Ct Pct 4.4 (*)    RBC. 2.64 (*)    Immature Retic Fract 29.5 (*)    All other components within normal limits  CBC WITH DIFFERENTIAL/PLATELET  - Abnormal; Notable for the following components:   WBC 3.6 (*)    RBC 2.57 (*)    Hemoglobin 7.1 (*)    HCT 23.7 (*)    RDW 15.9 (*)    Platelets 111 (*)  Lymphs Abs 0.4 (*)    All other components within normal limits  BASIC METABOLIC PANEL - Abnormal; Notable for the following components:   Glucose, Bld 249 (*)    Calcium 8.8 (*)    All other components within normal limits  VITAMIN B12  FOLATE  TYPE AND SCREEN  PREPARE RBC (CROSSMATCH)    EKG None  Radiology No results found.  Procedures Procedures    Medications Ordered in ED Medications  0.9 %  sodium chloride infusion (has no administration in time range)    ED Course/ Medical Decision Making/ A&P                           Medical Decision Making Amount and/or Complexity of Data Reviewed Labs: ordered.  Risk Prescription drug management. Decision regarding hospitalization.   This patient presents to the ED for concern of progressive anemia in the setting of potential iron deficiency anemia and progressive blood loss., this involves an extensive number of treatment options, and is a complaint that carries with it a high risk of complications and morbidity.  The differential diagnosis includes potentially recurrent bleeding arteriovenous malformations, esophageal varices bleeding, poor production   Co morbidities that complicate the patient evaluation  Recurrent anemia, atrial fibrillation on Eliquis   Additional history obtained:  Additional history obtained from electronic medical record External records from outside source obtained and reviewed including colonoscopy, GI notes, endoscopy   Lab Tests:  I Ordered, and personally interpreted labs.  The pertinent results include:  CBC - severe anemia, thrombocytopenia, BMP without acute findings, slight hyperglycemia     Cardiac Monitoring: / EKG:  The patient was maintained on a cardiac monitor.  I personally viewed and interpreted the  cardiac monitored which showed an underlying rhythm of: Normal sinus rhythm   Consultations Obtained:  I requested consultation with the gastroenterologist Dr. Jenetta Downer who recommends the patient be admitted to the hospital and they will arrange a capsule endoscopy, the patient is to be transfused,  and discussed lab and imaging findings as well as pertinent plan with hospitalist- they recommend: Admission   Problem List / ED Course / Critical interventions / Medication management  GI bleeding, patient appears to be hemodynamically stable but needs a unit of blood as well as admission I ordered medication including transfused packed red blood cells for chronic GI bleed Reevaluation of the patient after these medicines showed that the patient improved I have reviewed the patients home medicines and have made adjustments as needed   Social Determinants of Health:  Chronic GI bleeding   Test / Admission - Considered:  We will admit to hospital         Final Clinical Impression(s) / ED Diagnoses Final diagnoses:  Gastrointestinal hemorrhage, unspecified gastrointestinal hemorrhage type  Severe anemia    Rx / DC Orders ED Discharge Orders     None         Noemi Chapel, MD 04/11/22 1111

## 2022-04-11 NOTE — Assessment & Plan Note (Signed)
Continue nadolol. 

## 2022-04-11 NOTE — Assessment & Plan Note (Addendum)
Currently in sinus rhythm Rate controlled Continue diltiazem Holding apixaban--last dose 7/28 at 8AM Patient desired to stop apixaban altogether given his recurrent ABLA and GI bleeding making him transfusion dependent and requiring repeated hospitalizations --we discussed the risks, benefits, alternatives --he expressed understanding of his higher risk of stroke off apixaban --start ASA 81 mg daily

## 2022-04-11 NOTE — Assessment & Plan Note (Signed)
Secondary to liver cirrhosis Baseline 100-110

## 2022-04-11 NOTE — Hospital Course (Signed)
76 year old male with a history of NASH cirrhosis, paroxysmal atrial fibrillation, iron deficiency anemia, hypertension, hyperlipidemia, diabetes mellitus type 2 presenting with symptomatic anemia.  The patient had routine blood work done through his GI physician on 04/10/2022.  He was notified that his hemoglobin had dropped to 6.5 and instructed to go to the emergency department for transfusion and further work-up.  The patient himself has not felt extremely bad although he does endorse some malaise and dizziness.  He has had some occasional dyspnea on exertion in the past week.  The patient is color blind; therefore, he is unable to tell me if he is having any hematochezia.  He states that he has had noted some "dark stools" since he started taking iron.  He denies any hematemesis, hematuria.  He denies any NSAIDs or alcohol.  He has not had any fevers, chills, chest pain, shortness breath, palpitations, abdominal pain. Notably, the patient had colonoscopy on 03/26/2022 which showed one 5 mm AVM that was oozing blood.  It was ablated and clipped.  A second nonbleeding AVM, 8 mm, was also clipped.  There was a prominent vascular pattern throughout the colon likely secondary to his cirrhosis.  He also had an EGD on 01/23/2022 which showed grade 1-2 esophageal varices and portal hypertensive gastropathy.  Notably, the patient was in the ED on 03/13/2022 with symptomatic anemia.  He was transfused 1 unit PRBC and discharged home.  His hemoglobin had been hanging in the 7-8 range for the better part of the past month. In the ED, the patient was afebrile and hemodynamically stable with oxygen saturation 100% room air.  WBC 3.6, hemoglobin 10.1, platelets 111,000.  Sodium 135, potassium 4.9, bicarbonate 25, serum creatinine 1.08.  Ferritin 12, iron saturation 40%, B12 593, reticulocyte count 117.  GI was consulted to assist with management.  1 unit PRBC was  ordered. On 04/12/22 Capsule endoscopy was deployed and a second  unit PRBC was ordered.

## 2022-04-11 NOTE — Assessment & Plan Note (Signed)
Continue statin. 

## 2022-04-11 NOTE — Assessment & Plan Note (Signed)
SYMPTOMATIC ANEMIA -GI consulted>>capsule endoscopy 7/29 -Transfused 1 unit PRBC 7/28 -transfuse second unit PRBC 7/29 -Holding apixaban

## 2022-04-11 NOTE — H&P (View-Only) (Signed)
Gastroenterology Consult   Referring Provider: No ref. provider found Primary Care Physician:  [name], MD Primary Gastroenterologist:  Dr. Gala Romney  Patient ID: Derek Drake; [id number]; 08-Mar-1946   Admit date: 04/11/2022  LOS: 0 days   Date of Consultation: 04/11/2022  Reason for Consultation:  anemia, GI bleed  History of Present Illness   Derek Drake is a 76 y.o. year old male with history of [name] cirrhosis, chronic GERD, on Eliquis for A-fib, found to have progressive anemia over the last several months with iron deficiency.  Patient presented to the ED at the recommendation of Aliene Altes, [place] with our GI service as he had CBC yesterday revealing hemoglobin of 6.5.  He has history of prior transfusions and is in need of another.  GI consulted for further evaluation and management.  ED course: Labs - hemoglobin 7.1, MCV 92.2, platelets 111, iron 241, saturation 48%, ferritin 12, normal B12   Consult: Prior GI workup:  Patient had recent heme-negative stool and June 2023.  He has had downtrending hemoglobin since May 2023.  In May his hemoglobin was 8.2, previously 13.1 in November 2021.  Dr. 7.3 on 6/13 and then again to 6.6 on 6/27.  Most recent iron panel on 6/13 with iron 28, saturation 6%, ferritin 10.  During his last video visit on 03/03/2022 continue to complain of fatigue that is intermittent and nonprogressive.  He also denied abdominal pain, confusion, jaundice, mental status changes, or overt GI bleeding.  Colonoscopy 2018: One 4 mm polyp with benign mucosa in the descending colon, mucosal ulceration s/p biopsy, ileal erosions (Path revealed focal erosion).  EGD May 2023: 3 columns of grade 1-2 varices, portal gastropathy, normal duodenum and was started on nadolol.  He had hemoglobin of 6.6 on 6/27 that was 6.6 and was advised to go to the ED for blood transfusion, he received 1 unit PRBCs was advised to resume oral iron.  Follow-up visit on 03/20/2022 patient  reported feeling better after receiving blood transfusion and denies shortness of breath, weakness, presyncope, chest pain, palpitations or any overt GI bleeding.  Patient did report dark stools since being on iron.  He reported stable BPs at home on nadolol.  Colonoscopy date moved up.  Colonoscopy 03/26/2022: 5 mm AVM in the base of the cecum found to be actively oozing and was treated with APC therapy and clip placed with good hemostasis, 8 mm innocent appearing AVM in the cecum as well also treated with APC therapy and clipped, relatively prominent vascular pattern throughout the colon, 30 cm of terminal ileum appeared normal.  Hemoglobin 8.4 on day of procedure.  Multiple hemoglobin rechecks over the last couple of weeks were 7.2, 7.5, and dropped to 6.5 on 7/27.   Today:  Patient had breakfast at 730 this morning.  Patient reports that he got a call this morning about his hemoglobin and stated that he has been feeling fine since his colonoscopy.  He only noted one occurrence of shortness of breath when carrying a box up the stairs a couple of days ago.  He has been out working in the garden and picking his vegetables without any difficulty.  He denies any recent bleeding episodes.  He denies any abdominal pain, melena, hematochezia, nausea, vomiting, or chest pain.  He denies any constipation.  He has been noting some looser stools with about 4-6 a day usually in the mornings after he takes his metformin.  He does note some dark stools however he  has been taking oral iron.  He noted that when he stopped the iron prior to his colonoscopy his stools were normal brown color.  He denies any urinary issues.  He has been taking all medications as previously instructed.  Past Medical History:  Diagnosis Date   Cirrhosis ([place])    Immune to Hep A. Received Hep B vaccination in 2022.   Essential hypertension    GERD (gastroesophageal reflux disease)    Hypercholesteremia    PAF (paroxysmal atrial  fibrillation) ([place]) 05/04/2014   Type 2 diabetes mellitus [facility])     Past Surgical History:  Procedure Laterality Date   BIOPSY  02/05/2017   Procedure: BIOPSY;  Surgeon: Daneil Dolin, MD;  [facility];  Service: Endoscopy;;  gastric colon   CHOLECYSTECTOMY     COLONOSCOPY N/A 05/13/2013   Procedure: COLONOSCOPY;  Surgeon: Daneil Dolin, MD;  [facility];  Service: Endoscopy;  Laterality: N/A;  8:30 AM   COLONOSCOPY N/A 02/05/2017   Procedure: COLONOSCOPY;  Surgeon: Daneil Dolin, MD;  [facility];  Service: Endoscopy;  Laterality: N/A;   COLONOSCOPY WITH PROPOFOL N/A 03/26/2022   Procedure: COLONOSCOPY WITH PROPOFOL;  Surgeon: Daneil Dolin, MD;  [facility];  Service: Endoscopy;  Laterality: N/A;  7:30am, moved to 7/12 @ 2:45   ESOPHAGOGASTRODUODENOSCOPY N/A 02/05/2017   Procedure: ESOPHAGOGASTRODUODENOSCOPY (EGD);  Surgeon: Daneil Dolin, MD;  [facility];  Service: Endoscopy;  Laterality: N/A;  730 - moved to 12:00    ESOPHAGOGASTRODUODENOSCOPY N/A 07/04/2020   Procedure: ESOPHAGOGASTRODUODENOSCOPY (EGD);  Surgeon: Daneil Dolin, MD;  [facility];  Service: Endoscopy;  Laterality: N/A;  12:45pm   ESOPHAGOGASTRODUODENOSCOPY (EGD) WITH PROPOFOL N/A 01/23/2022   3 columns of grade 1-2 varices, portal gastropathy, normal duodenum. Begin Nadolol 20 mg daily.   GIVENS CAPSULE STUDY N/A 07/04/2020   Procedure: GIVENS CAPSULE STUDY;  Surgeon: Daneil Dolin, MD;  [facility];  Service: Endoscopy;  Laterality: N/A;  Elected not to place a capsule today   HOT HEMOSTASIS  03/26/2022   Procedure: HOT HEMOSTASIS (ARGON PLASMA COAGULATION/BICAP);  Surgeon: Daneil Dolin, MD;  [facility];  Service: Endoscopy;;   POLYPECTOMY  02/05/2017   Procedure: POLYPECTOMY;  Surgeon: Daneil Dolin, MD;  [facility];  Service: Endoscopy;;  colon    Prior to Admission medications    Medication Sig Start Date End Date Taking? Authorizing Provider  apixaban (ELIQUIS) 5 MG TABS tablet Take 1 tablet (5 mg total) by mouth 2 (two) times daily. 05/05/14   [name], MD  cholecalciferol (VITAMIN D3) 25 MCG (1000 UNIT) tablet Take 1,000 Units by mouth daily.    [provider]  CVS IRON 325 (65 Fe) MG tablet Take 325 mg by mouth 2 (two) times daily. 03/21/22   [provider]  diltiazem (CARDIZEM CD) 360 MG 24 hr capsule Take 360 mg by mouth daily.     [provider]  ferrous sulfate 325 (65 FE) MG EC tablet Take 325 mg by mouth in the morning and at bedtime.     [provider]  gabapentin (NEURONTIN) 100 MG capsule Take 100 mg by mouth at bedtime. 07/07/19   [provider]  [name] 500 MG CAPS Take 500 mg by mouth daily.    [provider]  losartan (COZAAR) 100 MG tablet Take 100 mg by mouth daily.  [provider]  metFORMIN (GLUCOPHAGE) 500 MG tablet Take 1,000 mg by mouth 2 (two) times daily with a meal.    [provider]  milk thistle 175 MG tablet Take 175 mg by mouth daily.    [provider]  Multiple Vitamin (MULTIVITAMIN WITH MINERALS) TABS tablet Take 1 tablet by mouth daily.    [provider]  nadolol (CORGARD) 20 MG tablet Take 20 mg by mouth daily.    [provider]  pantoprazole (PROTONIX) 40 MG tablet TAKE 1 TABLET BY MOUTH EVERY DAY Patient taking differently: Take 40 mg by mouth daily. 06/21/21   Mahala Menghini, PA-C  simvastatin (ZOCOR) 40 MG tablet Take 40 mg by mouth every evening.     [provider]    Current Facility-Administered Medications  Medication Dose Route Frequency Provider Last Rate Last Admin   0.9 %  sodium chloride infusion  10 mL/hr Intravenous Once Noemi Chapel, MD       Current Outpatient Medications  Medication Sig Dispense Refill   apixaban (ELIQUIS) 5 MG TABS tablet Take 1 tablet (5 mg total) by mouth 2 (two)  times daily. 60 tablet 0   cholecalciferol (VITAMIN D3) 25 MCG (1000 UNIT) tablet Take 1,000 Units by mouth daily.     CVS IRON 325 (65 Fe) MG tablet Take 325 mg by mouth 2 (two) times daily.     diltiazem (CARDIZEM CD) 360 MG 24 hr capsule Take 360 mg by mouth daily.      ferrous sulfate 325 (65 FE) MG EC tablet Take 325 mg by mouth in the morning and at bedtime.      gabapentin (NEURONTIN) 100 MG capsule Take 100 mg by mouth at bedtime.     Krill Oil 500 MG CAPS Take 500 mg by mouth daily.     losartan (COZAAR) 100 MG tablet Take 100 mg by mouth daily.      metFORMIN (GLUCOPHAGE) 500 MG tablet Take 1,000 mg by mouth 2 (two) times daily with a meal.     milk thistle 175 MG tablet Take 175 mg by mouth daily.     Multiple Vitamin (MULTIVITAMIN WITH MINERALS) TABS tablet Take 1 tablet by mouth daily.     nadolol (CORGARD) 20 MG tablet Take 20 mg by mouth daily.     pantoprazole (PROTONIX) 40 MG tablet TAKE 1 TABLET BY MOUTH EVERY DAY (Patient taking differently: Take 40 mg by mouth daily.) 90 tablet 3   simvastatin (ZOCOR) 40 MG tablet Take 40 mg by mouth every evening.       Allergies as of 04/11/2022 - Review Complete 04/11/2022  Allergen Reaction Noted   Rabbit protein Swelling 05/13/2013   Hydrocodone-acetaminophen Rash 12/07/2015   Other Swelling and Rash 05/13/2013   Ivp dye [iodinated contrast media] Other (See Comments) 01/30/2017    Family History  Problem Relation Age of Onset   Prostate cancer Brother    Congestive Heart Failure Mother    Heart failure Brother    Colon cancer Neg Hx     Social History   Socioeconomic History   Marital status: Married    Spouse name: Not on file   Number of children: Not on file   Years of education: Not on file   Highest education level: Not on file  Occupational History   Not on file  Tobacco Use   Smoking status: Former    Packs/day: 2.00    Years: 10.00    Total pack years:  20.00    Types: Cigarettes    Start date:  05/25/1956    Quit date: 05/25/2009    Years since quitting: 12.8   Smokeless tobacco: Current    Types: Chew  Vaping Use   Vaping Use: Never used  Substance and Sexual Activity   Alcohol use: Not Currently   Drug use: No   Sexual activity: Not Currently  Other Topics Concern   Not on file  Social History Narrative   Not on file   Social Determinants of Health   Financial Resource Strain: Not on file  Food Insecurity: Not on file  Transportation Needs: Not on file  Physical Activity: Not on file  Stress: Not on file  Social Connections: Not on file  Intimate Partner Violence: Not on file     Review of Systems   Gen: Denies any fever, chills, loss of appetite, change in weight or weight loss CV: Denies chest pain, heart palpitations, syncope, edema  Resp: Denies shortness of breath with rest, cough, wheezing, coughing up blood, and pleurisy. GI: Denies vomiting blood, jaundice, and fecal incontinence.   Denies dysphagia or odynophagia. GU : Denies urinary burning, blood in urine, urinary frequency, and urinary incontinence. MS: Denies joint pain, limitation of movement, swelling, cramps, and atrophy.  Derm: Denies rash, itching, dry skin, hives. Psych: Denies depression, anxiety, memory loss, hallucinations, and confusion. Heme: Denies bruising or bleeding Neuro:  Denies any headaches, dizziness, paresthesias, shaking  Physical Exam   Vital Signs in last 24 hours: Temp:  [98 F (36.7 C)] 98 F (36.7 C) (07/28 0918) Pulse Rate:  [73] 73 (07/28 0918) Resp:  [17] 17 (07/28 0918) BP: (132)/(71) 132/71 (07/28 0918) SpO2:  [100 %] 100 % (07/28 0918) Weight:  [81.2 kg] 81.2 kg (07/28 0919)    General:   Alert,  Well-developed, well-nourished, pleasant and cooperative in NAD Head:  Normocephalic and atraumatic. Eyes:  Sclera clear, no icterus.   Conjunctiva pink. Ears:  Normal auditory acuity. Mouth:  No deformity or lesions, dentition normal. Lungs:  Clear throughout  to auscultation.   No wheezes, crackles, or rhonchi. No acute distress. Heart:  Regular rate and rhythm. Abdomen:  Soft, nontender and nondistended. No masses, hepatosplenomegaly or hernias noted. Normal bowel sounds, without guarding, and without rebound.   Rectal: deferred   Msk:  Symmetrical without gross deformities. Normal posture. Extremities:  Without clubbing or edema. Neurologic:  Alert and  oriented x4. Skin:  Intact without significant lesions or rashes. Psych:  Alert and cooperative. Normal mood and affect.  Intake/Output from previous day: No intake/output data recorded. Intake/Output this shift: No intake/output data recorded.   Labs/Studies   Recent Labs Recent Labs    04/10/22 0915 04/11/22 0921  WBC 3.1* 3.6*  HGB 6.5* 7.1*  HCT 22.0* 23.7*  PLT 91* 111*   BMET Recent Labs    04/11/22 0921  NA 135  K 4.9  CL 103  CO2 25  GLUCOSE 249*  BUN 17  CREATININE 1.08  CALCIUM 8.8*   LFT No results for input(s): "PROT", "ALBUMIN", "AST", "ALT", "ALKPHOS", "BILITOT", "BILIDIR", "IBILI" in the last 72 hours. PT/INR No results for input(s): "LABPROT", "INR" in the last 72 hours. Hepatitis Panel No results for input(s): "HEPBSAG", "HCVAB", "HEPAIGM", "HEPBIGM" in the last 72 hours. C-Diff No results for input(s): "CDIFFTOX" in the last 72 hours.  Radiology/Studies No results found.   Assessment   Derek Drake is a 76 y.o. year old male with history of [name]  cirrhosis, chronic GERD, A-fib on Eliquis, and IDA likely secondary to cirrhosis and known AVMs who presented to the ED at the recommendation of our GI practice due to hemoglobin of 6.5 on outpatient labs and recommendation for blood transfusion.  Iron deficiency anemia: Patient presents to the ED with recent hemoglobin of 6.5 on outpatient basis.  Recheck today reveals hemoglobin of 7.1.  Patient previously received blood transfusion on 6/27 for hemoglobin of 6.6.  Patient is at EGD and colonoscopy  this year.  EGD in May 2023 with grade 1/2 varices and portal gastropathy.  Recent colonoscopy on 7/12 with 2 cecal AVMs 1 with active bleeding, s/p clip placement x2 and APC therapy.  Repeat anemia panel with iron 241, iron sat 48%, and ferritin 12, he has been taking oral iron given twice daily at home.  He denies any shortness of breath, chest pain, melena, hematochezia, abdominal pain, or epistaxis.  Given that his hemoglobin is not less than 7 we will hold off on transfusion for now. He continues to have a drift down his hemoglobin therefore in order to complete work-up he will need capsule study to further evaluate his small bowel.   Plan / Recommendations   Givens capsule tomorrow Clear liquids this afternoon NPO at midnight Continue to monitor H/H, transfuse for hemoglobin less than 7.  Goal hemoglobin 7-8 given cirrhosis and presence of esophageal varices Continue PPI Continue nadolol Avoid all NSAIDs No medications 2 hours prior to swallowing pill cam.    04/11/2022, 11:21 AM  Venetia Night, MSN, FNP-BC, AGACNP-BC [facility]

## 2022-04-11 NOTE — H&P (Addendum)
History and Physical    Patient: Derek Drake EEF:[id number] DOB: [birth year] DOA: 04/11/2022 DOS: the patient was seen and examined on 04/11/2022 PCP: [name], MD  Patient coming from: Home  Chief Complaint:  Chief Complaint  Patient presents with   Abnormal Lab   HPI: Derek Drake is a 76 year old male with a history of NASH cirrhosis, paroxysmal atrial fibrillation, iron deficiency anemia, hypertension, hyperlipidemia, diabetes mellitus type 2 presenting with symptomatic anemia.  The patient had routine blood work done through his GI physician on 04/10/2022.  He was notified that his hemoglobin had dropped to 6.5 and instructed to go to the emergency department for transfusion and further work-up.  The patient himself has not felt extremely bad although he does endorse some malaise and dizziness.  He has had some occasional dyspnea on exertion in the past week.  The patient is color blind; therefore, he is unable to tell me if he is having any hematochezia.  He states that he has had noted some "dark stools" since he started taking iron.  He denies any hematemesis, hematuria.  He denies any NSAIDs or alcohol.  He has not had any fevers, chills, chest pain, shortness breath, palpitations, abdominal pain. Notably, the patient had colonoscopy on 03/26/2022 which showed one 5 mm AVM that was oozing blood.  It was ablated and clipped.  A second nonbleeding AVM, 8 mm, was also clipped.  There was a prominent vascular pattern throughout the colon likely secondary to his cirrhosis.  He also had an EGD on 01/23/2022 which showed grade 1-2 esophageal varices and portal hypertensive gastropathy.  Notably, the patient was in the ED on 03/13/2022 with symptomatic anemia.  He was transfused 1 unit PRBC and discharged home.  His hemoglobin had been hanging in the 7-8 range for the better part of the past month. In the ED, the patient was afebrile and hemodynamically stable with oxygen saturation 100% room air.   WBC 3.6, hemoglobin 10.1, platelets 111,000.  Sodium 135, potassium 4.9, bicarbonate 25, serum creatinine 1.08.  Ferritin 12, iron saturation 40%, B12 593, reticulocyte count 117.  GI was consulted to assist with management.  1 unit PRBC was  ordered.  Review of Systems: As mentioned in the history of present illness. All other systems reviewed and are negative. Past Medical History:  Diagnosis Date   Cirrhosis ([place])    Immune to Hep A. Received Hep B vaccination in 2022.   Essential hypertension    GERD (gastroesophageal reflux disease)    Hypercholesteremia    PAF (paroxysmal atrial fibrillation) ([place]) 05/04/2014   Type 2 diabetes mellitus [facility])    Past Surgical History:  Procedure Laterality Date   BIOPSY  02/05/2017   Procedure: BIOPSY;  Surgeon: Daneil Dolin, MD;  [facility];  Service: Endoscopy;;  gastric colon   CHOLECYSTECTOMY     COLONOSCOPY N/A 05/13/2013   Procedure: COLONOSCOPY;  Surgeon: Daneil Dolin, MD;  [facility];  Service: Endoscopy;  Laterality: N/A;  8:30 AM   COLONOSCOPY N/A 02/05/2017   Procedure: COLONOSCOPY;  Surgeon: Daneil Dolin, MD;  [facility];  Service: Endoscopy;  Laterality: N/A;   COLONOSCOPY WITH PROPOFOL N/A 03/26/2022   Procedure: COLONOSCOPY WITH PROPOFOL;  Surgeon: Daneil Dolin, MD;  [facility];  Service: Endoscopy;  Laterality: N/A;  7:30am, moved to 7/12 @ 2:45   ESOPHAGOGASTRODUODENOSCOPY N/A 02/05/2017   Procedure: ESOPHAGOGASTRODUODENOSCOPY (EGD);  Surgeon: Daneil Dolin, MD;  [facility];  Service: Endoscopy;  Laterality: N/A;  730 - moved to 12:00    ESOPHAGOGASTRODUODENOSCOPY N/A 07/04/2020   Procedure: ESOPHAGOGASTRODUODENOSCOPY (EGD);  Surgeon: Daneil Dolin, MD;  [facility];  Service: Endoscopy;  Laterality: N/A;  12:45pm   ESOPHAGOGASTRODUODENOSCOPY (EGD) WITH PROPOFOL N/A 01/23/2022   3 columns of grade 1-2 varices, portal gastropathy, normal  duodenum. Begin Nadolol 20 mg daily.   GIVENS CAPSULE STUDY N/A 07/04/2020   Procedure: GIVENS CAPSULE STUDY;  Surgeon: Daneil Dolin, MD;  [facility];  Service: Endoscopy;  Laterality: N/A;  Elected not to place a capsule today   HOT HEMOSTASIS  03/26/2022   Procedure: HOT HEMOSTASIS (ARGON PLASMA COAGULATION/BICAP);  Surgeon: Daneil Dolin, MD;  [facility];  Service: Endoscopy;;   POLYPECTOMY  02/05/2017   Procedure: POLYPECTOMY;  Surgeon: Daneil Dolin, MD;  [facility];  Service: Endoscopy;;  colon   Social History:  reports that he quit smoking about 12 years ago. His smoking use included cigarettes. He started smoking about 65 years ago. He has a 20.00 pack-year smoking history. His smokeless tobacco use includes chew. He reports that he does not currently use alcohol. He reports that he does not use drugs.  Allergies  Allergen Reactions   Rabbit Protein Swelling    Face swelled up   Hydrocodone-Acetaminophen Rash   Other Swelling and Rash    Dial soap   Ivp Dye [Iodinated Contrast Media] Other (See Comments)    Possible allergy    Family History  Problem Relation Age of Onset   Prostate cancer Brother    Congestive Heart Failure Mother    Heart failure Brother    Colon cancer Neg Hx     Prior to Admission medications   Medication Sig Start Date End Date Taking? Authorizing Provider  apixaban (ELIQUIS) 5 MG TABS tablet Take 1 tablet (5 mg total) by mouth 2 (two) times daily. 05/05/14  Yes [name], MD  cholecalciferol (VITAMIN D3) 25 MCG (1000 UNIT) tablet Take 1,000 Units by mouth daily.   Yes [provider]  CVS IRON 325 (65 Fe) MG tablet Take 325 mg by mouth 2 (two) times daily. 03/21/22  Yes [provider]  diltiazem (CARDIZEM CD) 360 MG 24 hr capsule Take 360 mg by mouth daily.    Yes [provider]  gabapentin (NEURONTIN) 100 MG capsule Take 100 mg by mouth at bedtime. 07/07/19  Yes  [provider]  [name] 500 MG CAPS Take 500 mg by mouth daily.   Yes [provider]  losartan (COZAAR) 100 MG tablet Take 100 mg by mouth daily.    Yes [provider]  metFORMIN (GLUCOPHAGE) 500 MG tablet Take 1,000 mg by mouth 2 (two) times daily with a meal.   Yes [provider]  milk thistle 175 MG tablet Take 175 mg by mouth daily.   Yes [provider]  Multiple Vitamin (MULTIVITAMIN WITH MINERALS) TABS tablet Take 1 tablet by mouth daily.   Yes [provider]  nadolol (CORGARD) 20 MG tablet Take 20 mg by mouth daily.   Yes [provider]  pantoprazole (PROTONIX) 40 MG tablet TAKE 1 TABLET BY MOUTH EVERY DAY Patient taking differently: Take 40 mg by mouth daily. 06/21/21  Yes Mahala Menghini, PA-C  simvastatin (ZOCOR) 40 MG tablet Take 40 mg by mouth every evening.    Yes [provider]  Physical Exam: Vitals:   04/11/22 0918 04/11/22 0919  BP: 132/71   Pulse: 73   Resp: 17   Temp: 98 F (36.7 C)   TempSrc: Oral   SpO2: 100%   Weight:  81.2 kg  Height:  '5\' 11"'$  (1.803 m)   GENERAL:  A&O x 3, NAD, well developed, cooperative, follows commands HEENT: /AT, No thrush, No icterus, No oral ulcers Neck:  No neck mass, No meningismus, soft, supple CV: RRR, no S3, no S4, no rub, no JVD Lungs: Bibasal crackles but no wheezing.  Good air movement Abd: soft/NT +BS, nondistended Ext: No edema, no lymphangitis, no cyanosis, no rashes Neuro:  CN II-XII intact, strength 4/5 in RUE, RLE, strength 4/5 LUE, LLE; sensation intact bilateral; no dysmetria; babinski equivocal  Data Reviewed: Data reviewed above in the history Assessment and Plan: * Acute on chronic blood loss anemia SYMPTOMATIC ANEMIA -GI consulted -Transfused 1 unit PRBC -Holding apixaban  Mixed hyperlipidemia Continue statin  Liver cirrhosis secondary to NASH (HCC) Continue nadolol  Essential hypertension, benign Continue Cardizem  CD, nadolol Holding losartan temporarily  Controlled type 2 diabetes mellitus without complication, without long-term current use of insulin (HCC) Holding metformin Check hemoglobin A1c NovoLog sliding scale  Thrombocytopenia (HCC) Secondary to liver cirrhosis Baseline 100-110  PAF (paroxysmal atrial fibrillation) (HCC) Currently in sinus rhythm Rate controlled Continue diltiazem Holding apixaban--last dose 7/28 at [place]: FULL  Consults: GI  Family Communication: spouse updated 7/28  Severity of Illness: The appropriate patient status for this patient is OBSERVATION. Observation status is judged to be reasonable and necessary in order to provide the required intensity of service to ensure the patient's safety. The patient's presenting symptoms, physical exam findings, and initial radiographic and laboratory data in the context of their medical condition is felt to place them at decreased risk for further clinical deterioration. Furthermore, it is anticipated that the patient will be medically stable for discharge from the hospital within 2 midnights of admission.   Author: Orson Eva, MD 04/11/2022 12:00 PM  For on call review [email or web address].

## 2022-04-11 NOTE — ED Triage Notes (Signed)
Patient states that he was told to come in for blood transfusion. Labs drawn yesterday, Hgb 6.5. Patient states that he is color blind and is unable to tell if he has blood in stool. Denies any symptoms. States that he feels great. Hx of transfusion.

## 2022-04-11 NOTE — Assessment & Plan Note (Addendum)
Holding metformin Check hemoglobin A1c NovoLog sliding scale

## 2022-04-12 ENCOUNTER — Encounter (HOSPITAL_COMMUNITY)
Admission: EM | Disposition: A | Discharge status: Home / Self Care | Payer: Self-pay | Source: Home / Self Care | Attending: Internal Medicine

## 2022-04-12 DIAGNOSIS — K7460 Unspecified cirrhosis of liver: Secondary | ICD-10-CM

## 2022-04-12 DIAGNOSIS — K5520 Angiodysplasia of colon without hemorrhage: Secondary | ICD-10-CM | POA: Diagnosis present

## 2022-04-12 DIAGNOSIS — D649 Anemia, unspecified: Secondary | ICD-10-CM | POA: Diagnosis present

## 2022-04-12 DIAGNOSIS — D509 Iron deficiency anemia, unspecified: Secondary | ICD-10-CM | POA: Diagnosis not present

## 2022-04-12 DIAGNOSIS — K7581 Nonalcoholic steatohepatitis (NASH): Secondary | ICD-10-CM

## 2022-04-12 DIAGNOSIS — H5350 Unspecified color vision deficiencies: Secondary | ICD-10-CM | POA: Diagnosis present

## 2022-04-12 DIAGNOSIS — D696 Thrombocytopenia, unspecified: Secondary | ICD-10-CM | POA: Diagnosis present

## 2022-04-12 DIAGNOSIS — K649 Unspecified hemorrhoids: Secondary | ICD-10-CM | POA: Diagnosis not present

## 2022-04-12 DIAGNOSIS — I10 Essential (primary) hypertension: Secondary | ICD-10-CM | POA: Diagnosis present

## 2022-04-12 DIAGNOSIS — Z9049 Acquired absence of other specified parts of digestive tract: Secondary | ICD-10-CM | POA: Diagnosis not present

## 2022-04-12 DIAGNOSIS — D62 Acute posthemorrhagic anemia: Secondary | ICD-10-CM | POA: Diagnosis present

## 2022-04-12 DIAGNOSIS — K648 Other hemorrhoids: Secondary | ICD-10-CM | POA: Diagnosis present

## 2022-04-12 DIAGNOSIS — Z91018 Allergy to other foods: Secondary | ICD-10-CM | POA: Diagnosis not present

## 2022-04-12 DIAGNOSIS — Z7984 Long term (current) use of oral hypoglycemic drugs: Secondary | ICD-10-CM | POA: Diagnosis not present

## 2022-04-12 DIAGNOSIS — K633 Ulcer of intestine: Secondary | ICD-10-CM | POA: Diagnosis present

## 2022-04-12 DIAGNOSIS — I480 Paroxysmal atrial fibrillation: Secondary | ICD-10-CM | POA: Diagnosis present

## 2022-04-12 DIAGNOSIS — Z7901 Long term (current) use of anticoagulants: Secondary | ICD-10-CM | POA: Diagnosis not present

## 2022-04-12 DIAGNOSIS — Z79899 Other long term (current) drug therapy: Secondary | ICD-10-CM | POA: Diagnosis not present

## 2022-04-12 DIAGNOSIS — K3189 Other diseases of stomach and duodenum: Secondary | ICD-10-CM | POA: Diagnosis present

## 2022-04-12 DIAGNOSIS — D500 Iron deficiency anemia secondary to blood loss (chronic): Secondary | ICD-10-CM | POA: Diagnosis not present

## 2022-04-12 DIAGNOSIS — E119 Type 2 diabetes mellitus without complications: Secondary | ICD-10-CM | POA: Diagnosis present

## 2022-04-12 DIAGNOSIS — K922 Gastrointestinal hemorrhage, unspecified: Secondary | ICD-10-CM | POA: Diagnosis present

## 2022-04-12 DIAGNOSIS — E782 Mixed hyperlipidemia: Secondary | ICD-10-CM | POA: Diagnosis not present

## 2022-04-12 DIAGNOSIS — Z885 Allergy status to narcotic agent status: Secondary | ICD-10-CM | POA: Diagnosis not present

## 2022-04-12 DIAGNOSIS — K219 Gastro-esophageal reflux disease without esophagitis: Secondary | ICD-10-CM | POA: Diagnosis present

## 2022-04-12 DIAGNOSIS — K766 Portal hypertension: Secondary | ICD-10-CM | POA: Diagnosis present

## 2022-04-12 DIAGNOSIS — I8510 Secondary esophageal varices without bleeding: Secondary | ICD-10-CM | POA: Diagnosis present

## 2022-04-12 DIAGNOSIS — Z91041 Radiographic dye allergy status: Secondary | ICD-10-CM | POA: Diagnosis not present

## 2022-04-12 DIAGNOSIS — D61818 Other pancytopenia: Secondary | ICD-10-CM | POA: Diagnosis present

## 2022-04-12 HISTORY — PX: GIVENS CAPSULE STUDY: SHX5432

## 2022-04-12 LAB — BASIC METABOLIC PANEL
Anion gap: 5 (ref 5–15)
BUN: 13 mg/dL (ref 8–23)
CO2: 26 mmol/L (ref 22–32)
Calcium: 8.7 mg/dL — ABNORMAL LOW (ref 8.9–10.3)
Chloride: 106 mmol/L (ref 98–111)
Creatinine, Ser: 0.94 mg/dL (ref 0.61–1.24)
GFR, Estimated: >60 mL/min (ref >60)
Glucose, Bld: 148 mg/dL — ABNORMAL HIGH (ref 70–99)
Potassium: 4.2 mmol/L (ref 3.5–5.1)
Sodium: 137 mmol/L (ref 135–145)

## 2022-04-12 LAB — CBC
HCT: 22.8 % — ABNORMAL LOW (ref 39.0–52.0)
Hemoglobin: 7 g/dL — ABNORMAL LOW (ref 13.0–17.0)
MCH: 27.6 pg (ref 26.0–34.0)
MCHC: 30.7 g/dL (ref 30.0–36.0)
MCV: 89.8 fL (ref 80.0–100.0)
Platelets: 93 10*3/uL — ABNORMAL LOW (ref 150–400)
RBC: 2.54 MIL/uL — ABNORMAL LOW (ref 4.22–5.81)
RDW: 16.3 % — ABNORMAL HIGH (ref 11.5–15.5)
WBC: 2.5 10*3/uL — ABNORMAL LOW (ref 4.0–10.5)
nRBC: 0 % (ref 0.0–0.2)

## 2022-04-12 LAB — GLUCOSE, CAPILLARY: Glucose-Capillary: 264 mg/dL — ABNORMAL HIGH (ref 70–99)

## 2022-04-12 LAB — PREPARE RBC (CROSSMATCH)

## 2022-04-12 SURGERY — IMAGING PROCEDURE, GI TRACT, INTRALUMINAL, VIA CAPSULE

## 2022-04-12 MED ORDER — SODIUM CHLORIDE 0.9% IV SOLUTION: Freq: Once | INTRAVENOUS | Status: DC

## 2022-04-12 MED ORDER — ORAL CARE MOUTH RINSE: 15.0000 mL | OROMUCOSAL | Status: DC | PRN

## 2022-04-12 NOTE — Progress Notes (Signed)
Derek Drake, M.D. Gastroenterology & Hepatology   Interval History:  No acute events overnight. Patient reports well and denies having any nausea, vomiting, fever or chills.  States that he believes he had some melena x1 yesterday but his stools have cleared up since then, no hematochezia. Notably, he received 1 unit of PRBC yesterday but repeat hemoglobin today was 7.0.  White blood cell count today is 2.5 and platelets were 93,000.  BUN has remained low at 13. Patient swallowed capsule endoscopy today at 8 AM.  Inpatient Medications:  Current Facility-Administered Medications:    acetaminophen (TYLENOL) tablet 650 mg, 650 mg, Oral, Q6H PRN **OR** acetaminophen (TYLENOL) suppository 650 mg, 650 mg, Rectal, Q6H PRN, [name], [name], MD   diltiazem (CARDIZEM CD) 24 hr capsule 360 mg, 360 mg, Oral, Daily, [name], [name], MD, 360 mg at 04/12/22 1031   ferrous sulfate tablet 325 mg, 325 mg, Oral, BID, [name], [name], MD, 325 mg at 04/12/22 1030   gabapentin (NEURONTIN) capsule 100 mg, 100 mg, Oral, QHS, [name], [name], MD, 100 mg at 04/11/22 2119   nadolol (CORGARD) tablet 20 mg, 20 mg, Oral, Daily, [name], [name], MD, 20 mg at 04/12/22 1030   ondansetron (ZOFRAN) tablet 4 mg, 4 mg, Oral, Q6H PRN **OR** ondansetron (ZOFRAN) injection 4 mg, 4 mg, Intravenous, Q6H PRN, [name], [name], MD   pantoprazole (PROTONIX) EC tablet 40 mg, 40 mg, Oral, Daily, [name], [name], MD, 40 mg at 04/12/22 1030   simvastatin (ZOCOR) tablet 40 mg, 40 mg, Oral, QPM, [name], [name], MD, 40 mg at 04/11/22 1840   I/O    Intake/Output Summary (Last 24 hours) at 04/12/2022 1043 Last data filed at 04/12/2022 0120 Gross per 24 hour  Intake 27 ml  Output 1250 ml  Net -1223 ml     Physical Exam: Temp:  [98 F (36.7 C)-98.3 F (36.8 C)] 98.2 F (36.8 C) (07/29 0544) Pulse Rate:  [67-76] 70 (07/29 0544) Resp:  [12-21] 16 (07/29 0544) BP: (101-135)/(54-75) 129/70 (07/29 0544) SpO2:  [97 %-99 %] 97 % (07/29 0544)  Temp (24hrs), Avg:98.1 F (36.7  C), Min:98 F (36.7 C), Max:98.3 F (36.8 C) GENERAL: The patient is AO x3, in no acute distress. HEENT: Head is normocephalic and atraumatic. EOMI are intact. Mouth is well hydrated and without lesions. NECK: Supple. No masses LUNGS: Clear to auscultation. No presence of rhonchi/wheezing/rales. Adequate chest expansion HEART: RRR, normal s1 and s2. ABDOMEN: Soft, nontender, no guarding, no peritoneal signs, and nondistended. BS +. No masses. EXTREMITIES: Without any cyanosis, clubbing, rash, lesions or edema. NEUROLOGIC: AOx3, no focal motor deficit. SKIN: no jaundice, no rashes  Laboratory Data: CBC:     Component Value Date/Time   WBC 2.5 (L) 04/12/2022 0518   RBC 2.54 (L) 04/12/2022 0518   HGB 7.0 (L) 04/12/2022 0518   HGB 13.1 08/03/2020 1227   HCT 22.8 (L) 04/12/2022 0518   HCT 39.1 08/03/2020 1227   PLT 93 (L) 04/12/2022 0518   PLT 115 (L) 08/03/2020 1227   MCV 89.8 04/12/2022 0518   MCV 89 08/03/2020 1227   MCH 27.6 04/12/2022 0518   MCHC 30.7 04/12/2022 0518   RDW 16.3 (H) 04/12/2022 0518   RDW 13.3 08/03/2020 1227   LYMPHSABS 0.4 (L) 04/11/2022 0921   LYMPHSABS 0.9 08/03/2020 1227   MONOABS 0.6 04/11/2022 0921   EOSABS 0.2 04/11/2022 0921   EOSABS 0.1 08/03/2020 1227   BASOSABS 0.0 04/11/2022 0921   BASOSABS 0.0 08/03/2020 1227   COAG:  Lab Results  Component Value Date   INR 1.1 03/26/2022   INR 1.0 01/21/2022   INR 1.0 08/03/2020    BMP:     Latest Ref Rng & Units 04/12/2022    5:18 AM 04/11/2022    9:21 AM 03/24/2022    3:03 PM  BMP  Glucose 70 - 99 mg/dL 148  249  164   BUN 8 - 23 mg/dL _0 Creatinine 0.61 - 1.24 mg/dL 0.94  1.08  1.04   Sodium 135 - 145 mmol/L 137  135  135   Potassium 3.5 - 5.1 mmol/L 4.2  4.9  4.9   Chloride 98 - 111 mmol/L 106  103  101   CO2 22 - 32 mmol/L _1 Calcium 8.9 - 10.3 mg/dL 8.7  8.8  9.1     HEPATIC:     Latest Ref Rng & Units 03/24/2022    3:03 PM 01/21/2022   12:49 PM 08/03/2020   12:28 PM   Hepatic Function  Total Protein 6.5 - 8.1 g/dL 6.1  6.4  7.6   Albumin 3.5 - 5.0 g/dL 3.3  3.6  5.2   AST 15 - 41 U/L 27  27  33   ALT 0 - 44 U/L 19  20  37   Alk Phosphatase 38 - 126 U/L 80  84  84   Total Bilirubin 0.3 - 1.2 mg/dL 0.8  0.6  0.4   Bilirubin, Direct 0.00 - 0.40 mg/dL   0.14     CARDIAC:  Lab Results  Component Value Date   TROPONINI <0.30 05/05/2014      Imaging: I personally reviewed and interpreted the available labs, imaging and endoscopic files.   Assessment/Plan: 76 year old male with past medical history of Derek Drake cirrhosis complicated by nonbleeding esophageal varices, cecal AVM x2 status post APC and clipping, GERD, Eliquis and A-fib, diabetes, hyperlipidemia and hypertension, who came to the hospital after being found to have low hemoglobin in outpatient blood work-up.  Patient denies having any overt gastrointestinal bleeding although he reports having 1 episode of melena yesterday.  He has been hemodynamically stable.  He received 1 unit of PRBC but did not have adequate correction of his hemoglobin as today his hemoglobin is 7.0.  We we will further investigate his current presentation with a capsule endoscopy which will be read tomorrow.  For now, we will keep him with pantoprazole once a day and he will need to have 1 more unit of PBC transfused.  He has not presented any other decompensating event such as encephalopathy or ascites.  No further management is warranted for this at the moment.  - Repeat CBC daily, transfuse if Hb <8 - Pantoprazole 40 mg daily - 2 large bore IV lines - Active T/S - Follow capsule endoscopy result - Continue nadolol 20 mg qday - Avoid NSAIDs  Derek Peppers, MD Gastroenterology and Hepatology [facility]

## 2022-04-12 NOTE — Progress Notes (Signed)
PROGRESS NOTE  Derek Drake EQA:[id number] DOB: 1946/08/26 DOA: 04/11/2022 PCP: [name], MD  Brief History:  76 year old male with a history of NASH cirrhosis, paroxysmal atrial fibrillation, iron deficiency anemia, hypertension, hyperlipidemia, diabetes mellitus type 2 presenting with symptomatic anemia.  The patient had routine blood work done through his GI physician on 04/10/2022.  He was notified that his hemoglobin had dropped to 6.5 and instructed to go to the emergency department for transfusion and further work-up.  The patient himself has not felt extremely bad although he does endorse some malaise and dizziness.  He has had some occasional dyspnea on exertion in the past week.  The patient is color blind; therefore, he is unable to tell me if he is having any hematochezia.  He states that he has had noted some "dark stools" since he started taking iron.  He denies any hematemesis, hematuria.  He denies any NSAIDs or alcohol.  He has not had any fevers, chills, chest pain, shortness breath, palpitations, abdominal pain. Notably, the patient had colonoscopy on 03/26/2022 which showed one 5 mm AVM that was oozing blood.  It was ablated and clipped.  A second nonbleeding AVM, 8 mm, was also clipped.  There was a prominent vascular pattern throughout the colon likely secondary to his cirrhosis.  He also had an EGD on 01/23/2022 which showed grade 1-2 esophageal varices and portal hypertensive gastropathy.  Notably, the patient was in the ED on 03/13/2022 with symptomatic anemia.  He was transfused 1 unit PRBC and discharged home.  His hemoglobin had been hanging in the 7-8 range for the better part of the past month. In the ED, the patient was afebrile and hemodynamically stable with oxygen saturation 100% room air.  WBC 3.6, hemoglobin 10.1, platelets 111,000.  Sodium 135, potassium 4.9, bicarbonate 25, serum creatinine 1.08.  Ferritin 12, iron saturation 40%, B12 593, reticulocyte count  117.  GI was consulted to assist with management.  1 unit PRBC was  ordered. On 04/12/22 Capsule endoscopy was deployed and a second unit PRBC was ordered.     Assessment and Plan: * Acute on chronic blood loss anemia SYMPTOMATIC ANEMIA -GI consulted>>capsule endoscopy 7/29 -Transfused 1 unit PRBC 7/28 -transfuse second unit PRBC 7/29 -Holding apixaban  Mixed hyperlipidemia Continue statin  Liver cirrhosis secondary to NASH ([place]) Continue nadolol  Essential hypertension, benign Continue Cardizem CD, nadolol Holding losartan temporarily  Controlled type 2 diabetes mellitus without complication, without long-term current use of insulin (HCC) Holding metformin Check hemoglobin A1c NovoLog sliding scale  Thrombocytopenia (HCC) Secondary to liver cirrhosis Baseline 100-110  PAF (paroxysmal atrial fibrillation) ([place]) Currently in sinus rhythm Rate controlled Continue diltiazem Holding apixaban--last dose 7/28 at [place]:   spouse updated at bedside 7/29  Consultants:  GI  Code Status:  FULL   DVT Prophylaxis:  apixaban on hold   Procedures: As Listed in Progress Note Above  Antibiotics: None       Subjective: He had 3 BM since yesterday.  Initially he noted some melena, but then states it improved.  Denies f/c, cp, sob, n/v/d, abd pain  Objective: Vitals:   04/11/22 1821 04/11/22 2133 04/12/22 0121 04/12/22 0544  BP: 135/67 109/75 (!) 119/58 129/70  Pulse: 74 76 67 70  Resp: '16 16 18 16  '$ Temp:  98.2 F (36.8 C) 98.3 F (36.8 C) 98.2 F (36.8 C)  TempSrc:  Oral Oral Oral  SpO2: 98% 98% 98% 97%  Weight:      Height:        Intake/Output Summary (Last 24 hours) at 04/12/2022 1455 Last data filed at 04/12/2022 1300 Gross per 24 hour  Intake 267 ml  Output 1250 ml  Net -983 ml   Weight change:  Exam:  General:  Pt is alert, follows commands appropriately, not in acute distress HEENT: No icterus, No thrush, No neck  mass, [place]/AT Cardiovascular: RRR, S1/S2, no rubs, no gallops Respiratory: CTA bilaterally, no wheezing, no crackles, no rhonchi Abdomen: Soft/+BS, non tender, non distended, no guarding Extremities: No edema, No lymphangitis, No petechiae, No rashes, no synovitis   Data Reviewed: I have personally reviewed following labs and imaging studies Basic Metabolic Panel: Recent Labs  Lab 04/11/22 0921 04/12/22 0518  NA 135 137  K 4.9 4.2  CL 103 106  CO2 25 26  GLUCOSE 249* 148*  BUN 17 13  CREATININE 1.08 0.94  CALCIUM 8.8* 8.7*   Liver Function Tests: No results for input(s): "AST", "ALT", "ALKPHOS", "BILITOT", "PROT", "ALBUMIN" in the last 168 hours. No results for input(s): "LIPASE", "AMYLASE" in the last 168 hours. No results for input(s): "AMMONIA" in the last 168 hours. Coagulation Profile: No results for input(s): "INR", "PROTIME" in the last 168 hours. CBC: Recent Labs  Lab 04/10/22 0915 04/11/22 0921 04/12/22 0518  WBC 3.1* 3.6* 2.5*  NEUTROABS 1,975 2.4  --   HGB 6.5* 7.1* 7.0*  HCT 22.0* 23.7* 22.8*  MCV 91.7 92.2 89.8  PLT 91* 111* 93*   Cardiac Enzymes: No results for input(s): "CKTOTAL", "CKMB", "CKMBINDEX", "TROPONINI" in the last 168 hours. BNP: Invalid input(s): "POCBNP" CBG: Recent Labs  Lab 04/11/22 2140  GLUCAP 127*   HbA1C: No results for input(s): "HGBA1C" in the last 72 hours. Urine analysis: No results found for: "COLORURINE", "APPEARANCEUR", "LABSPEC", "PHURINE", "GLUCOSEU", "HGBUR", "BILIRUBINUR", "KETONESUR", "PROTEINUR", "UROBILINOGEN", "NITRITE", "LEUKOCYTESUR" Sepsis Labs: '@LABRCNTIP'$ (procalcitonin:4,lacticidven:4) )No results found for this or any previous visit (from the past 240 hour(s)).   Scheduled Meds:  sodium chloride   Intravenous Once   diltiazem  360 mg Oral Daily   ferrous sulfate  325 mg Oral BID   gabapentin  100 mg Oral QHS   nadolol  20 mg Oral Daily   pantoprazole  40 mg Oral Daily   simvastatin  40 mg Oral QPM    Continuous Infusions:  Procedures/Studies: No results found.  Orson Eva, DO  [facility]  If 7PM-7AM, please contact night-coverage [email or web address] Password [email or web address] 04/12/2022, 2:55 PM   LOS: 0 days

## 2022-04-13 DIAGNOSIS — K3189 Other diseases of stomach and duodenum: Secondary | ICD-10-CM | POA: Diagnosis not present

## 2022-04-13 DIAGNOSIS — K922 Gastrointestinal hemorrhage, unspecified: Secondary | ICD-10-CM

## 2022-04-13 DIAGNOSIS — D62 Acute posthemorrhagic anemia: Secondary | ICD-10-CM | POA: Diagnosis not present

## 2022-04-13 DIAGNOSIS — D500 Iron deficiency anemia secondary to blood loss (chronic): Secondary | ICD-10-CM

## 2022-04-13 DIAGNOSIS — K5520 Angiodysplasia of colon without hemorrhage: Secondary | ICD-10-CM | POA: Diagnosis not present

## 2022-04-13 DIAGNOSIS — K7581 Nonalcoholic steatohepatitis (NASH): Secondary | ICD-10-CM | POA: Diagnosis not present

## 2022-04-13 DIAGNOSIS — I10 Essential (primary) hypertension: Secondary | ICD-10-CM | POA: Diagnosis not present

## 2022-04-13 DIAGNOSIS — K7460 Unspecified cirrhosis of liver: Secondary | ICD-10-CM | POA: Diagnosis not present

## 2022-04-13 DIAGNOSIS — K766 Portal hypertension: Secondary | ICD-10-CM | POA: Diagnosis not present

## 2022-04-13 DIAGNOSIS — E119 Type 2 diabetes mellitus without complications: Secondary | ICD-10-CM | POA: Diagnosis not present

## 2022-04-13 LAB — CBC
HCT: 26.6 % — ABNORMAL LOW (ref 39.0–52.0)
Hemoglobin: 8.5 g/dL — ABNORMAL LOW (ref 13.0–17.0)
MCH: 28 pg (ref 26.0–34.0)
MCHC: 32 g/dL (ref 30.0–36.0)
MCV: 87.5 fL (ref 80.0–100.0)
Platelets: 94 10*3/uL — ABNORMAL LOW (ref 150–400)
RBC: 3.04 MIL/uL — ABNORMAL LOW (ref 4.22–5.81)
RDW: 16.1 % — ABNORMAL HIGH (ref 11.5–15.5)
WBC: 3 10*3/uL — ABNORMAL LOW (ref 4.0–10.5)
nRBC: 0 % (ref 0.0–0.2)

## 2022-04-13 LAB — TYPE AND SCREEN
ABO/RH(D): O POS
Antibody Screen: NEGATIVE
Unit division: 0
Unit division: 0

## 2022-04-13 LAB — HEMOGLOBIN A1C
Hgb A1c MFr Bld: 5.2 % (ref 4.8–5.6)
Mean Plasma Glucose: 102.54 mg/dL

## 2022-04-13 LAB — BPAM RBC
Blood Product Expiration Date: 202308312359
Blood Product Expiration Date: 202309022359
ISSUE DATE / TIME: 202307281557
ISSUE DATE / TIME: 202307292200
Unit Type and Rh: 5100
Unit Type and Rh: 5100

## 2022-04-13 MED ORDER — PEG 3350-KCL-NA BICARB-NACL 420 G PO SOLR
4000.0000 mL | Freq: Once | ORAL | Status: AC
  Administered 2022-04-13: 4000 mL via ORAL

## 2022-04-13 NOTE — Progress Notes (Signed)
PROGRESS NOTE  Derek SWAMY JKD:[id number] DOB: October 10, 1945 DOA: 04/11/2022 PCP: [name], MD  Brief History:  76 year old male with a history of NASH cirrhosis, paroxysmal atrial fibrillation, iron deficiency anemia, hypertension, hyperlipidemia, diabetes mellitus type 2 presenting with symptomatic anemia.  The patient had routine blood work done through his GI physician on 04/10/2022.  He was notified that his hemoglobin had dropped to 6.5 and instructed to go to the emergency department for transfusion and further work-up.  The patient himself has not felt extremely bad although he does endorse some malaise and dizziness.  He has had some occasional dyspnea on exertion in the past week.  The patient is color blind; therefore, he is unable to tell me if he is having any hematochezia.  He states that he has had noted some "dark stools" since he started taking iron.  He denies any hematemesis, hematuria.  He denies any NSAIDs or alcohol.  He has not had any fevers, chills, chest pain, shortness breath, palpitations, abdominal pain. Notably, the patient had colonoscopy on 03/26/2022 which showed one 5 mm AVM that was oozing blood.  It was ablated and clipped.  A second nonbleeding AVM, 8 mm, was also clipped.  There was a prominent vascular pattern throughout the colon likely secondary to his cirrhosis.  He also had an EGD on 01/23/2022 which showed grade 1-2 esophageal varices and portal hypertensive gastropathy.  Notably, the patient was in the ED on 03/13/2022 with symptomatic anemia.  He was transfused 1 unit PRBC and discharged home.  His hemoglobin had been hanging in the 7-8 range for the better part of the past month. In the ED, the patient was afebrile and hemodynamically stable with oxygen saturation 100% room air.  WBC 3.6, hemoglobin 10.1, platelets 111,000.  Sodium 135, potassium 4.9, bicarbonate 25, serum creatinine 1.08.  Ferritin 12, iron saturation 40%, B12 593, reticulocyte count  117.  GI was consulted to assist with management.  1 unit PRBC was  ordered. On 04/12/22 Capsule endoscopy was deployed and a second unit PRBC was ordered.   Assessment/Plan:   Principal Problem:   Acute on chronic blood loss anemia Active Problems:   PAF (paroxysmal atrial fibrillation) (HCC)   Thrombocytopenia ([place])   Controlled type 2 diabetes mellitus without complication, without long-term current use of insulin (HCC)   Essential hypertension, benign   Liver cirrhosis secondary to NASH ([place])   Mixed hyperlipidemia   Symptomatic anemia  Assessment and Plan: * Acute on chronic blood loss anemia SYMPTOMATIC ANEMIA -GI consulted>>capsule endoscopy 7/29 -Transfused 1 unit PRBC 7/28 -transfuse second unit PRBC 7/29 -7/29 capsule study--nonbleeding portal hypertensive gastropathy and nonbleeding patchy enteropathy. one nonbleeding AVM in the colon -Holding apixaban -GI plans colonoscopy 04/14/22  Mixed hyperlipidemia Continue statin  Liver cirrhosis secondary to NASH ([place]) Continue nadolol  Essential hypertension, benign Continue Cardizem CD, nadolol Holding losartan temporarily  Controlled type 2 diabetes mellitus without complication, without long-term current use of insulin (HCC) Holding metformin 7/30 hemoglobin A1c--5.3 NovoLog sliding scale  Thrombocytopenia (HCC) Secondary to liver cirrhosis Baseline 100-110  PAF (paroxysmal atrial fibrillation) ([place]) Currently in sinus rhythm Rate controlled Continue diltiazem Holding apixaban--last dose 7/28 at [place] Communication:   spouse updated at bedside 7/30   Consultants:  GI   Code Status:  FULL    DVT Prophylaxis:  apixaban on hold     Procedures: As Listed in Progress Note Above   Antibiotics:  None        Subjective: Patient denies fevers, chills, headache, chest pain, dyspnea, nausea, vomiting, diarrhea, abdominal pain, dysuria, hematuria, hematochezia,  Continues to have occasional  "dark" stools   Objective: Vitals:   04/12/22 2232 04/13/22 0042 04/13/22 0410 04/13/22 1325  BP: (!) 107/55 (!) 117/57 109/83 116/62  Pulse: 65 (!) 59 61 (!) 59  Resp: '17 18 18 18  '$ Temp: 98.3 F (36.8 C) 98.1 F (36.7 C) 98.2 F (36.8 C) 97.9 F (36.6 C)  TempSrc:   Oral   SpO2: 98% 98% 99% 100%  Weight:      Height:        Intake/Output Summary (Last 24 hours) at 04/13/2022 1727 Last data filed at 04/13/2022 0044 Gross per 24 hour  Intake 542 ml  Output --  Net 542 ml   Weight change:  Exam:  General:  Pt is alert, follows commands appropriately, not in acute distress HEENT: No icterus, No thrush, No neck mass, [place]/AT Cardiovascular: RRR, S1/S2, no rubs, no gallops Respiratory: CTA bilaterally, no wheezing, no crackles, no rhonchi Abdomen: Soft/+BS, non tender, non distended, no guarding Extremities: No edema, No lymphangitis, No petechiae, No rashes, no synovitis   Data Reviewed: I have personally reviewed following labs and imaging studies Basic Metabolic Panel: Recent Labs  Lab 04/11/22 0921 04/12/22 0518  NA 135 137  K 4.9 4.2  CL 103 106  CO2 25 26  GLUCOSE 249* 148*  BUN 17 13  CREATININE 1.08 0.94  CALCIUM 8.8* 8.7*   Liver Function Tests: No results for input(s): "AST", "ALT", "ALKPHOS", "BILITOT", "PROT", "ALBUMIN" in the last 168 hours. No results for input(s): "LIPASE", "AMYLASE" in the last 168 hours. No results for input(s): "AMMONIA" in the last 168 hours. Coagulation Profile: No results for input(s): "INR", "PROTIME" in the last 168 hours. CBC: Recent Labs  Lab 04/10/22 0915 04/11/22 0921 04/12/22 0518 04/13/22 0545  WBC 3.1* 3.6* 2.5* 3.0*  NEUTROABS 1,975 2.4  --   --   HGB 6.5* 7.1* 7.0* 8.5*  HCT 22.0* 23.7* 22.8* 26.6*  MCV 91.7 92.2 89.8 87.5  PLT 91* 111* 93* 94*   Cardiac Enzymes: No results for input(s): "CKTOTAL", "CKMB", "CKMBINDEX", "TROPONINI" in the last 168 hours. BNP: Invalid input(s): "POCBNP" CBG: Recent  Labs  Lab 04/11/22 2140 04/12/22 2020  GLUCAP 127* 264*   HbA1C: Recent Labs    04/13/22 0545  HGBA1C 5.2   Urine analysis: No results found for: "COLORURINE", "APPEARANCEUR", "LABSPEC", "PHURINE", "GLUCOSEU", "HGBUR", "BILIRUBINUR", "KETONESUR", "PROTEINUR", "UROBILINOGEN", "NITRITE", "LEUKOCYTESUR" Sepsis Labs: '@LABRCNTIP'$ (procalcitonin:4,lacticidven:4) )No results found for this or any previous visit (from the past 240 hour(s)).   Scheduled Meds:  sodium chloride   Intravenous Once   diltiazem  360 mg Oral Daily   ferrous sulfate  325 mg Oral BID   gabapentin  100 mg Oral QHS   nadolol  20 mg Oral Daily   pantoprazole  40 mg Oral Daily   [COMPLETED] polyethylene glycol-electrolytes  4,000 mL Oral Once   simvastatin  40 mg Oral QPM   Continuous Infusions:  Procedures/Studies: No results found.  Orson Eva, DO  [facility]  If 7PM-7AM, please contact night-coverage [email or web address] Password [email or web address] 04/13/2022, 5:27 PM   LOS: 1 day

## 2022-04-13 NOTE — Progress Notes (Signed)
Derek Drake, M.D. Gastroenterology & Hepatology   Interval History:  No acute events overnight. Patient reports feeling well and denies any melena, hematochezia, nausea, vomiting, fever, chills, lightheadedness or dizziness.  Tolerated diet adequately. Received a blood transfusion yesterday x1, hemoglobin increased to 8.5. Capsule endoscopy performed yesterday which showed presence of portal hypertensive gastropathy, multiple areas of portal congestive enteropathy, presence of 1 nonbleeding AVM in the colon and 1 polypoid area in the colon.  Inpatient Medications:  Current Facility-Administered Medications:    0.9 %  sodium chloride infusion (Manually program via Guardrails IV Fluids), , Intravenous, Once, [name], [name], MD   acetaminophen (TYLENOL) tablet 650 mg, 650 mg, Oral, Q6H PRN **OR** acetaminophen (TYLENOL) suppository 650 mg, 650 mg, Rectal, Q6H PRN, [name], [name], MD   diltiazem (CARDIZEM CD) 24 hr capsule 360 mg, 360 mg, Oral, Daily, [name], [name], MD, 360 mg at 04/13/22 0818   ferrous sulfate tablet 325 mg, 325 mg, Oral, BID, [name], [name], MD, 325 mg at 04/13/22 0818   gabapentin (NEURONTIN) capsule 100 mg, 100 mg, Oral, QHS, [name], [name], MD, 100 mg at 04/12/22 2127   nadolol (CORGARD) tablet 20 mg, 20 mg, Oral, Daily, [name], [name], MD, 20 mg at 04/13/22 0828   ondansetron (ZOFRAN) tablet 4 mg, 4 mg, Oral, Q6H PRN **OR** ondansetron (ZOFRAN) injection 4 mg, 4 mg, Intravenous, Q6H PRN, [name], [name], MD   Oral care mouth rinse, 15 mL, Mouth Rinse, PRN, [name], [name], MD   pantoprazole (PROTONIX) EC tablet 40 mg, 40 mg, Oral, Daily, [name], [name], MD, 40 mg at 04/13/22 0818   simvastatin (ZOCOR) tablet 40 mg, 40 mg, Oral, QPM, [name], [name], MD, 40 mg at 04/12/22 1710   I/O    Intake/Output Summary (Last 24 hours) at 04/13/2022 0927 Last data filed at 04/13/2022 0044 Gross per 24 hour  Intake 782 ml  Output 1000 ml  Net -218 ml     Physical Exam: Temp:  [98.1 F (36.7 C)-98.3 F (36.8 C)]  98.2 F (36.8 C) (07/30 0410) Pulse Rate:  [59-65] 61 (07/30 0410) Resp:  [17-18] 18 (07/30 0410) BP: (107-117)/(55-83) 109/83 (07/30 0410) SpO2:  [98 %-99 %] 99 % (07/30 0410)  Temp (24hrs), Avg:98.2 F (36.8 C), Min:98.1 F (36.7 C), Max:98.3 F (36.8 C) GENERAL: The patient is AO x3, in no acute distress. HEENT: Head is normocephalic and atraumatic. EOMI are intact. Mouth is well hydrated and without lesions. NECK: Supple. No masses LUNGS: Clear to auscultation. No presence of rhonchi/wheezing/rales. Adequate chest expansion HEART: RRR, normal s1 and s2. ABDOMEN: Soft, nontender, no guarding, no peritoneal signs, and nondistended. BS +. No masses. EXTREMITIES: Without any cyanosis, clubbing, rash, lesions or edema. NEUROLOGIC: AOx3, no focal motor deficit. SKIN: no jaundice, no rashes  Laboratory Data: CBC:     Component Value Date/Time   WBC 3.0 (L) 04/13/2022 0545   RBC 3.04 (L) 04/13/2022 0545   HGB 8.5 (L) 04/13/2022 0545   HGB 13.1 08/03/2020 1227   HCT 26.6 (L) 04/13/2022 0545   HCT 39.1 08/03/2020 1227   PLT 94 (L) 04/13/2022 0545   PLT 115 (L) 08/03/2020 1227   MCV 87.5 04/13/2022 0545   MCV 89 08/03/2020 1227   MCH 28.0 04/13/2022 0545   MCHC 32.0 04/13/2022 0545   RDW 16.1 (H) 04/13/2022 0545   RDW 13.3 08/03/2020 1227   LYMPHSABS 0.4 (L) 04/11/2022 0921   LYMPHSABS 0.9 08/03/2020 1227   MONOABS 0.6 04/11/2022 0921   EOSABS 0.2 04/11/2022 0921   EOSABS  0.1 08/03/2020 1227   BASOSABS 0.0 04/11/2022 0921   BASOSABS 0.0 08/03/2020 1227   COAG:  Lab Results  Component Value Date   INR 1.1 03/26/2022   INR 1.0 01/21/2022   INR 1.0 08/03/2020    BMP:     Latest Ref Rng & Units 04/12/2022    5:18 AM 04/11/2022    9:21 AM 03/24/2022    3:03 PM  BMP  Glucose 70 - 99 mg/dL 148  249  164   BUN 8 - 23 mg/dL _0 Creatinine 0.61 - 1.24 mg/dL 0.94  1.08  1.04   Sodium 135 - 145 mmol/L 137  135  135   Potassium 3.5 - 5.1 mmol/L 4.2  4.9  4.9    Chloride 98 - 111 mmol/L 106  103  101   CO2 22 - 32 mmol/L _1 Calcium 8.9 - 10.3 mg/dL 8.7  8.8  9.1     HEPATIC:     Latest Ref Rng & Units 03/24/2022    3:03 PM 01/21/2022   12:49 PM 08/03/2020   12:28 PM  Hepatic Function  Total Protein 6.5 - 8.1 g/dL 6.1  6.4  7.6   Albumin 3.5 - 5.0 g/dL 3.3  3.6  5.2   AST 15 - 41 U/L 27  27  33   ALT 0 - 44 U/L 19  20  37   Alk Phosphatase 38 - 126 U/L 80  84  84   Total Bilirubin 0.3 - 1.2 mg/dL 0.8  0.6  0.4   Bilirubin, Direct 0.00 - 0.40 mg/dL   0.14     CARDIAC:  Lab Results  Component Value Date   TROPONINI <0.30 05/05/2014      Imaging: I personally reviewed and interpreted the available labs, imaging and endoscopic files.   Assessment/Plan: 76 year old male with past medical history of [name] cirrhosis complicated by nonbleeding esophageal varices, cecal AVM x2 status post APC and clipping, GERD, Eliquis and A-fib, diabetes, hyperlipidemia and hypertension, who came to the hospital after being found to have low hemoglobin in outpatient blood work-up.  Patient denies having any overt gastrointestinal bleeding although he reports having 1 episode of melena.  He has been hemodynamically stable.  He received 2 unit of PRBC and had adequate correction of his hemoglobin.  Capsule endoscopy performed yesterday which showed presence of portal hypertensive gastropathy, multiple areas of portal congestive enteropathy, presence of 1 nonbleeding AVM in the colon and 1 polypoid area in the colon.  Due to this, I discussed with the patient the possibility of proceeding with colonoscopy tomorrow which he is agreeable.  I also discussed with him that given his recurrent episodes of iron deficiency anemia with presence of portal gastroenteritis, he may have high risk of requiring transfusion and hospitalization while taking Eliquis.  We will hold his Eliquis for now but he will discuss with his cardiologist other options for stroke prevention.    He has not presented any other decompensating event such as encephalopathy or ascites.  No further management is warranted for this at the moment.   - Repeat CBC daily, transfuse if Hb <8 - Pantoprazole 40 mg daily - 2 large bore IV lines - Active T/S -Colonoscopy tomorrow - Continue nadolol 20 mg qday - Avoid NSAIDs -Hold Eliquis  Derek Peppers, MD Gastroenterology and Hepatology [facility]

## 2022-04-13 NOTE — Procedures (Signed)
Small Bowel Givens Capsule Study Procedure date: 04/13/2022  Indication for procedure:  Iron deficiency anemia  Findings:  Study was adequate as capsule reached the cecum and the bowel preparation was adequate in the small bowel. There was presence of diffuse portal hypertensive gastropathy without active bleeding. There was also evidence of multiple areas of congestive erythematous enteropathy without active bleeding, worse in the proximal small bowel. There was presence of polypoid colonic tissue in the proximal colon. There was also presence of a medium size nonbleeding colonic AVM.       First Gastric image:  00:01:35 First Duodenal image: 00:31:36 First Cecal image: 02:38:37 Gastric Passage time: 0h 2mSmall Bowel Passage time:  2h 771mSummary & Recommendations: Presence of nonbleeding portal hypertensive gastropathy and nonbleeding patchy enteropathy. There is presence of one nonbleeding AVM in the colon. There was presence of a small polypoid area in the colon.  - Repeat CBC qday, transfuse if Hb <8 - Pantoprazole 40 mg every day - 2 large bore IV lines - Active T/S -Clear liquid diet - Avoid NSAIDs - Will proceed with colonoscopy tomorrow  I personally communicated these recommendations to the patient  DaMaylon PeppersMD Gastroenterology and Hepatology [facility]

## 2022-04-14 ENCOUNTER — Inpatient Hospital Stay (HOSPITAL_COMMUNITY): Payer: Medicare Other | Admitting: Anesthesiology

## 2022-04-14 ENCOUNTER — Encounter (HOSPITAL_COMMUNITY): Payer: Self-pay | Admitting: Internal Medicine

## 2022-04-14 ENCOUNTER — Other Ambulatory Visit (HOSPITAL_COMMUNITY): Payer: Medicare Other

## 2022-04-14 ENCOUNTER — Encounter (HOSPITAL_COMMUNITY)
Admission: EM | Disposition: A | Discharge status: Home / Self Care | Payer: Self-pay | Source: Home / Self Care | Attending: Internal Medicine

## 2022-04-14 DIAGNOSIS — K649 Unspecified hemorrhoids: Secondary | ICD-10-CM

## 2022-04-14 DIAGNOSIS — D509 Iron deficiency anemia, unspecified: Secondary | ICD-10-CM

## 2022-04-14 DIAGNOSIS — K633 Ulcer of intestine: Secondary | ICD-10-CM

## 2022-04-14 DIAGNOSIS — E782 Mixed hyperlipidemia: Secondary | ICD-10-CM | POA: Diagnosis not present

## 2022-04-14 DIAGNOSIS — K5520 Angiodysplasia of colon without hemorrhage: Secondary | ICD-10-CM

## 2022-04-14 DIAGNOSIS — K7581 Nonalcoholic steatohepatitis (NASH): Secondary | ICD-10-CM | POA: Diagnosis not present

## 2022-04-14 DIAGNOSIS — K922 Gastrointestinal hemorrhage, unspecified: Secondary | ICD-10-CM | POA: Diagnosis not present

## 2022-04-14 DIAGNOSIS — D62 Acute posthemorrhagic anemia: Secondary | ICD-10-CM | POA: Diagnosis not present

## 2022-04-14 HISTORY — PX: COLONOSCOPY WITH PROPOFOL: SHX5780

## 2022-04-14 HISTORY — PX: HOT HEMOSTASIS: SHX5433

## 2022-04-14 LAB — CBC
HCT: 26.7 % — ABNORMAL LOW (ref 39.0–52.0)
Hemoglobin: 8.3 g/dL — ABNORMAL LOW (ref 13.0–17.0)
MCH: 27.5 pg (ref 26.0–34.0)
MCHC: 31.1 g/dL (ref 30.0–36.0)
MCV: 88.4 fL (ref 80.0–100.0)
Platelets: 86 10*3/uL — ABNORMAL LOW (ref 150–400)
RBC: 3.02 MIL/uL — ABNORMAL LOW (ref 4.22–5.81)
RDW: 16.1 % — ABNORMAL HIGH (ref 11.5–15.5)
WBC: 2.3 10*3/uL — ABNORMAL LOW (ref 4.0–10.5)
nRBC: 0 % (ref 0.0–0.2)

## 2022-04-14 LAB — BASIC METABOLIC PANEL
Anion gap: 4 — ABNORMAL LOW (ref 5–15)
BUN: 10 mg/dL (ref 8–23)
CO2: 25 mmol/L (ref 22–32)
Calcium: 8.6 mg/dL — ABNORMAL LOW (ref 8.9–10.3)
Chloride: 109 mmol/L (ref 98–111)
Creatinine, Ser: 0.98 mg/dL (ref 0.61–1.24)
GFR, Estimated: >60 mL/min (ref >60)
Glucose, Bld: 142 mg/dL — ABNORMAL HIGH (ref 70–99)
Potassium: 4.1 mmol/L (ref 3.5–5.1)
Sodium: 138 mmol/L (ref 135–145)

## 2022-04-14 SURGERY — COLONOSCOPY WITH PROPOFOL
Anesthesia: General

## 2022-04-14 MED ORDER — EPHEDRINE 5 MG/ML INJ
INTRAVENOUS | Status: AC
  Filled 2022-04-14: qty 10

## 2022-04-14 MED ORDER — PHENYLEPHRINE 80 MCG/ML (10ML) SYRINGE FOR IV PUSH (FOR BLOOD PRESSURE SUPPORT)
PREFILLED_SYRINGE | INTRAVENOUS | Status: AC
  Filled 2022-04-14: qty 30

## 2022-04-14 MED ORDER — ONDANSETRON HCL 4 MG/2ML IJ SOLN
INTRAMUSCULAR | Status: AC
  Filled 2022-04-14: qty 2

## 2022-04-14 MED ORDER — PROPOFOL 10 MG/ML IV BOLUS
INTRAVENOUS | Status: DC | PRN
  Administered 2022-04-14 (×3): 20 mg via INTRAVENOUS
  Administered 2022-04-14: 30 mg via INTRAVENOUS
  Administered 2022-04-14: 80 mg via INTRAVENOUS
  Administered 2022-04-14: 30 mg via INTRAVENOUS

## 2022-04-14 MED ORDER — ASPIRIN 81 MG PO TBEC: 81.0000 mg | DELAYED_RELEASE_TABLET | Freq: Every day | ORAL | Status: DC

## 2022-04-14 MED ORDER — ASPIRIN 81 MG PO TBEC: 81.0000 mg | DELAYED_RELEASE_TABLET | Freq: Every day | ORAL | 12 refills | Status: DC

## 2022-04-14 MED ORDER — LIDOCAINE HCL (PF) 2 % IJ SOLN
INTRAMUSCULAR | Status: AC
  Filled 2022-04-14: qty 5

## 2022-04-14 MED ORDER — LACTATED RINGERS IV SOLN: INTRAVENOUS | Status: DC | PRN

## 2022-04-14 MED ORDER — LIDOCAINE HCL (CARDIAC) PF 100 MG/5ML IV SOSY
PREFILLED_SYRINGE | INTRAVENOUS | Status: DC | PRN
  Administered 2022-04-14: 40 mg via INTRATRACHEAL

## 2022-04-14 MED ORDER — SODIUM CHLORIDE 0.9 % IV SOLN: INTRAVENOUS | Status: DC

## 2022-04-14 NOTE — Op Note (Signed)
[facility] Patient Name: Derek Drake Procedure Date: 04/14/2022 1:44 PM MRN: [id number] Date of Birth: Feb 14, 1946 Attending MD: Elon Alas. Abbey Chatters DO CSN: [id number] Age: 76 Admit Type: Inpatient Procedure:                Colonoscopy Indications:              Iron deficiency anemia secondary to chronic blood                            loss Providers:                Elon Alas. Abbey Chatters, DO, Lambert Mody, Hughie Closs RN, RN, Aram Candela, Ladoris Gene, Technician Referring MD:              Medicines:                See the Anesthesia note for documentation of the                            administered medications Complications:            No immediate complications. Estimated Blood Loss:     Estimated blood loss: none. Procedure:                Pre-Anesthesia Assessment:                           - The anesthesia plan was to use monitored                            anesthesia care (MAC).                           After obtaining informed consent, the colonoscope                            was passed under direct vision. Throughout the                            procedure, the patient's blood pressure, pulse, and                            oxygen saturations were monitored continuously. The                            [id number]-HQ190L ([id number]) scope was introduced through                            the anus and advanced to the the cecum, identified                            by appendiceal orifice and ileocecal valve. The                            colonoscopy was performed without difficulty.  The                            patient tolerated the procedure well. The quality                            of the bowel preparation was evaluated using the                            BBPS [facility] Bowel Preparation Scale) with scores                            of: Right Colon = 3, Transverse Colon = 3 and Left                            Colon = 3 (entire mucosa seen  well with no residual                            staining, small fragments of stool or opaque                            liquid). The total BBPS score equals 9. Scope In: 1:59:35 PM Scope Out: 2:16:45 PM Scope Withdrawal Time: 0 hours 14 minutes 49 seconds  Total Procedure Duration: 0 hours 17 minutes 10 seconds  Findings:      The perianal and digital rectal examinations were normal.      Non-bleeding internal hemorrhoids were found during endoscopy.      A single (solitary) 8-10 mm ulcer was found in the cecum from previous       AVM treatment. No bleeding was present. No stigmata of recent bleeding       were seen.      Five medium-sized angiodysplastic lesions without bleeding were found in       the transverse colon and in the ascending colon. Coagulation for tissue       destruction using argon plasma was successful. Impression:               - Non-bleeding internal hemorrhoids.                           - A single (solitary) ulcer in the cecum.                           - Five non-bleeding colonic angiodysplastic                            lesions. Treated with argon plasma coagulation                            (APC).                           - No specimens collected. Moderate Sedation:      Per Anesthesia Care Recommendation:           - Return patient to hospital ward for ongoing care.                           -  Resume regular diet.                           - Restart Eliquis tomorrow. Procedure Code(s):        --- Professional ---                           [phone], Colonoscopy, flexible; with ablation of                            tumor(s), polyp(s), or other lesion(s) (includes                            pre- and post-dilation and guide wire passage, when                            performed) Diagnosis Code(s):        --- Professional ---                           K63.3, Ulcer of intestine                           K55.20, Angiodysplasia of colon without hemorrhage                            K64.8, Other hemorrhoids                           D50.0, Iron deficiency anemia secondary to blood                            loss (chronic) CPT copyright 2019 American Medical Association. All rights reserved. The codes documented in this report are preliminary and upon coder review may  be revised to meet current compliance requirements. Elon Alas. Abbey Chatters, DO [place] Abbey Chatters, DO 04/14/2022 2:22:49 PM This report has been signed electronically. Number of Addenda: 0

## 2022-04-14 NOTE — Interval H&P Note (Signed)
History and Physical Interval Note:  04/14/2022 1:26 PM  Derek Drake  has presented today for surgery, with the diagnosis of iron def anemia.  The various methods of treatment have been discussed with the patient and family. After consideration of risks, benefits and other options for treatment, the patient has consented to  Procedure(s): COLONOSCOPY WITH PROPOFOL (N/A) as a surgical intervention.  The patient's history has been reviewed, patient examined, no change in status, stable for surgery.  I have reviewed the patient's chart and labs.  Questions were answered to the patient's satisfaction.     Eloise Harman

## 2022-04-14 NOTE — Progress Notes (Signed)
Patient pleasant and cooperative with wife at bedside. Patient has been up and walking around in the hall. Plans for colonoscopy this afternoon.

## 2022-04-14 NOTE — Anesthesia Preprocedure Evaluation (Signed)
Anesthesia Evaluation  Patient identified by MRN, date of birth, ID band Patient awake    Reviewed: Allergy & Precautions, H&P , NPO status , Patient's Chart, lab work & pertinent test results, reviewed documented beta blocker date and time   Airway Mallampati: II  TM Distance: >3 FB Neck ROM: full    Dental no notable dental hx. (+) Loose,    Pulmonary neg pulmonary ROS, former smoker,    Pulmonary exam normal breath sounds clear to auscultation       Cardiovascular Exercise Tolerance: Good hypertension, negative cardio ROS   Rhythm:regular Rate:Normal     Neuro/Psych negative neurological ROS  negative psych ROS   GI/Hepatic Neg liver ROS, GERD  Medicated,  Endo/Other  negative endocrine ROSdiabetes, Type 2  Renal/GU negative Renal ROS  negative genitourinary   Musculoskeletal negative musculoskeletal ROS (+)   Abdominal   Peds  Hematology negative hematology ROS (+) Blood dyscrasia, anemia ,   Anesthesia Other Findings   Reproductive/Obstetrics negative OB ROS                             Anesthesia Physical  Anesthesia Plan  ASA: 2  Anesthesia Plan: General   Post-op Pain Management:    Induction:   PONV Risk Score and Plan: Propofol infusion and TIVA  Airway Management Planned:   Additional Equipment:   Intra-op Plan:   Post-operative Plan:   Informed Consent: I have reviewed the patients History and Physical, chart, labs and discussed the procedure including the risks, benefits and alternatives for the proposed anesthesia with the patient or authorized representative who has indicated his/her understanding and acceptance.     Dental Advisory Given  Plan Discussed with: CRNA  Anesthesia Plan Comments:         Anesthesia Quick Evaluation

## 2022-04-14 NOTE — Care Management Important Message (Signed)
Important Message  Patient Details  Name: Derek Drake MRN: [id number] Date of Birth: 02/13/1946   Medicare Important Message Given:  Yes (copy left in room with family)     Tommy Medal 04/14/2022, 2:14 PM

## 2022-04-14 NOTE — Discharge Summary (Addendum)
Physician Discharge Summary   Patient: Derek Drake MRN: [id number] DOB: 1945-12-09  Admit date:     04/11/2022  Discharge date: 04/14/22  Discharge Physician: [name] Yesennia Hirota   PCP: [name], MD   Recommendations at discharge:   Please follow up with primary care provider within 1-2 weeks  Please repeat BMP and CBC in one week   Hospital Course: 76 year old male with a history of NASH cirrhosis, paroxysmal atrial fibrillation, iron deficiency anemia, hypertension, hyperlipidemia, diabetes mellitus type 2 presenting with symptomatic anemia.  The patient had routine blood work done through his GI physician on 04/10/2022.  He was notified that his hemoglobin had dropped to 6.5 and instructed to go to the emergency department for transfusion and further work-up.  The patient himself has not felt extremely bad although he does endorse some malaise and dizziness.  He has had some occasional dyspnea on exertion in the past week.  The patient is color blind; therefore, he is unable to tell me if he is having any hematochezia.  He states that he has had noted some "dark stools" since he started taking iron.  He denies any hematemesis, hematuria.  He denies any NSAIDs or alcohol.  He has not had any fevers, chills, chest pain, shortness breath, palpitations, abdominal pain. Notably, the patient had colonoscopy on 03/26/2022 which showed one 5 mm AVM that was oozing blood.  It was ablated and clipped.  A second nonbleeding AVM, 8 mm, was also clipped.  There was a prominent vascular pattern throughout the colon likely secondary to his cirrhosis.  He also had an EGD on 01/23/2022 which showed grade 1-2 esophageal varices and portal hypertensive gastropathy.  Notably, the patient was in the ED on 03/13/2022 with symptomatic anemia.  He was transfused 1 unit PRBC and discharged home.  His hemoglobin had been hanging in the 7-8 range for the better part of the past month. In the ED, the patient was afebrile and  hemodynamically stable with oxygen saturation 100% room air.  WBC 3.6, hemoglobin 10.1, platelets 111,000.  Sodium 135, potassium 4.9, bicarbonate 25, serum creatinine 1.08.  Ferritin 12, iron saturation 40%, B12 593, reticulocyte count 117.  GI was consulted to assist with management.  1 unit PRBC was  ordered. On 04/12/22 Capsule endoscopy was deployed and a second unit PRBC was ordered.  Assessment and Plan: * Acute on chronic blood loss anemia SYMPTOMATIC ANEMIA -GI consulted>>capsule endoscopy 7/29 -Transfused 1 unit PRBC 7/28 -transfuse second unit PRBC 7/29 -7/29 capsule study--nonbleeding portal hypertensive gastropathy and nonbleeding patchy enteropathy. one nonbleeding AVM in the colon -Holding apixaban -colonoscopy 04/14/22>>5 AVMs in transverse and asc colon tx with APC Hgb remained stable after transfusions--8.3 on day of d/c  Mixed hyperlipidemia Continue statin  Liver cirrhosis secondary to NASH ([place]) Continue nadolol  Essential hypertension, benign Continue Cardizem CD, nadolol Holding losartan>>will not restart BP remains controlled  Controlled type 2 diabetes mellitus without complication, without long-term current use of insulin (HCC) Holding metformin 7/30 hemoglobin A1c--5.3 NovoLog sliding scale  Thrombocytopenia (HCC) Secondary to liver cirrhosis Baseline 100-110  PAF (paroxysmal atrial fibrillation) ([place]) Currently in sinus rhythm Rate controlled Continue diltiazem Holding apixaban--last dose 7/28 at [place] Patient desired to stop apixaban altogether given his recurrent ABLA and GI bleeding making him transfusion dependent and requiring repeated hospitalizations --we discussed the risks, benefits, alternatives --he expressed understanding of his higher risk of stroke off apixaban --start ASA 81 mg daily         Consultants: GI  Procedures performed: capsule study, colonoscopy  Disposition: Home Diet recommendation:  Cardiac diet DISCHARGE  MEDICATION: Allergies as of 04/14/2022       Reactions   Rabbit Protein Swelling   Face swelled up   Hydrocodone-acetaminophen Rash   Other Swelling, Rash   Dial soap   Ivp Dye [iodinated Contrast Media] Other (See Comments)   Possible allergy        Medication List     STOP taking these medications    apixaban 5 MG Tabs tablet Commonly known as: ELIQUIS   losartan 100 MG tablet Commonly known as: COZAAR       TAKE these medications    aspirin EC 81 MG tablet Take 1 tablet (81 mg total) by mouth daily. Swallow whole. Start taking on: April 15, 2022   cholecalciferol 25 MCG (1000 UNIT) tablet Commonly known as: VITAMIN D3 Take 1,000 Units by mouth daily.   [employer] Iron 325 (65 FE) MG tablet Generic drug: ferrous sulfate Take 325 mg by mouth 2 (two) times daily.   diltiazem 360 MG 24 hr capsule Commonly known as: CARDIZEM CD Take 360 mg by mouth daily.   gabapentin 100 MG capsule Commonly known as: NEURONTIN Take 100 mg by mouth at bedtime.   Krill Oil 500 MG Caps Take 500 mg by mouth daily.   metFORMIN 500 MG tablet Commonly known as: GLUCOPHAGE Take 1,000 mg by mouth 2 (two) times daily with a meal.   milk thistle 175 MG tablet Take 175 mg by mouth daily.   multivitamin with minerals Tabs tablet Take 1 tablet by mouth daily.   nadolol 20 MG tablet Commonly known as: CORGARD Take 20 mg by mouth daily.   pantoprazole 40 MG tablet Commonly known as: PROTONIX TAKE 1 TABLET BY MOUTH EVERY DAY   simvastatin 40 MG tablet Commonly known as: ZOCOR Take 40 mg by mouth every evening.        Discharge Exam: Filed Weights   04/11/22 0919  Weight: 81.2 kg   HEENT:  [place]/AT, No thrush, no icterus CV:  RRR, no rub, no S3, no S4 Lung:  CTA, no wheeze, no rhonchi Abd:  soft/+BS, NT Ext:  No edema, no lymphangitis, no synovitis, no rash   Condition at discharge: stable  The results of significant diagnostics from this hospitalization (including  imaging, microbiology, ancillary and laboratory) are listed below for reference.   Imaging Studies: No results found.  Microbiology: Results for orders placed or performed during the hospital encounter of 07/03/20  SARS CORONAVIRUS 2 ([place]inger[facility] 6-24 HRS) Nasopharyngeal Nasopharyngeal Swab     Status: None   Collection Time: 07/03/20  9:35 AM   Specimen: Nasopharyngeal Swab  Result Value Ref Range Status   SARS Coronavirus 2 NEGATIVE NEGATIVE Final    Comment: (NOTE) SARS-CoV-2 target nucleic acids are NOT DETECTED.  The SARS-CoV-2 RNA is generally detectable in upper and lower respiratory specimens during the acute phase of infection. Negative results do not preclude SARS-CoV-2 infection, do not rule out co-infections with other pathogens, and should not be used as the sole basis for treatment or other patient management decisions. Negative results must be combined with clinical observations, patient history, and epidemiological information. The expected result is Negative.  Fact Sheet for Patients: SugarRoll.be  Fact Sheet for Healthcare Providers: https://www.woods-mathews.com/  This test is not yet approved or cleared by the [place] FDA and  has been authorized for detection and/or diagnosis of SARS-CoV-2 by FDA under an Emergency Use Authorization (EUA).  This EUA will remain  in effect (meaning this test can be used) for the duration of the COVID-19 declaration under Se ction 564(b)(1) of the Act, 21 U.S.C. section 360bbb-3(b)(1), unless the authorization is terminated or revoked sooner.  Performed at [facility], [place] [address] [place], [place] [address]     Labs: CBC: Recent Labs  Lab 04/10/22 0915 04/11/22 3007 04/12/22 0518 04/13/22 0545 04/14/22 0537  WBC 3.1* 3.6* 2.5* 3.0* 2.3*  NEUTROABS 1,975 2.4  --   --   --   HGB 6.5* 7.1* 7.0* 8.5* 8.3*  HCT 22.0* 23.7* 22.8* 26.6* 26.7*  MCV 91.7 92.2 89.8 87.5  88.4  PLT 91* 111* 93* 94* 86*   Basic Metabolic Panel: Recent Labs  Lab 04/11/22 0921 04/12/22 0518 04/14/22 0537  NA 135 137 138  K 4.9 4.2 4.1  CL 103 106 109  CO2 '25 26 25  '$ GLUCOSE 249* 148* 142*  BUN '17 13 10  '$ CREATININE 1.08 0.94 0.98  CALCIUM 8.8* 8.7* 8.6*   Liver Function Tests: No results for input(s): "AST", "ALT", "ALKPHOS", "BILITOT", "PROT", "ALBUMIN" in the last 168 hours. CBG: Recent Labs  Lab 04/11/22 2140 04/12/22 2020  GLUCAP 127* 264*    Discharge time spent: greater than 30 minutes.  Signed: Orson Eva, MD [facility] 04/14/2022

## 2022-04-14 NOTE — Transfer of Care (Signed)
Immediate Anesthesia Transfer of Care Note  Patient: Derek Drake  Procedure(s) Performed: COLONOSCOPY WITH PROPOFOL HOT HEMOSTASIS (ARGON PLASMA COAGULATION/BICAP)  Patient Location: PACU  Anesthesia Type:General  Level of Consciousness: awake, alert  and oriented  Airway & Oxygen Therapy: Patient Spontanous Breathing  Post-op Assessment: Report given to RN and Post -op Vital signs reviewed and stable  Post vital signs: Reviewed and stable  Last Vitals:  Vitals Value Taken Time  BP 124/84   Temp 36   Pulse 84   Resp 16   SpO2 98     Last Pain:  Vitals:   04/14/22 1358  TempSrc:   PainSc: 0-No pain      Patients Stated Pain Goal: 7 (19/69/40 9828)  Complications: No notable events documented.

## 2022-04-15 ENCOUNTER — Telehealth: Payer: Self-pay | Admitting: Cardiology

## 2022-04-15 NOTE — Telephone Encounter (Signed)
I spoke with the patient and he will remain off Eliquis and follow up next month with Dr.McDowell as scheduled.

## 2022-04-15 NOTE — Telephone Encounter (Signed)
Pt c/o medication issue:  1. Name of Medication: Eliquis   2. How are you currently taking this medication (dosage and times per day)?    3. Are you having a reaction (difficulty breathing--STAT)?  no  4. What is your medication issue? Patient states that he has stop taking the medication. due to him having  to have 3 blood transfusion, in a 6 weeks time span, Patient was calling in to let the dr know. Please advise   Inform wife that Eliquis is not listed under current medication.

## 2022-04-15 NOTE — Telephone Encounter (Signed)
Pt states that he took his last Eliquis on Friday. Colonoscopy was done 3 weeks ago and a capsule study done recently. Wife states that he has had 3 blood transfusions over the last 3 weeks. Because of this he has stopped taking Eliquis and restarted ASA 81 mg.

## 2022-04-15 NOTE — Telephone Encounter (Signed)
    Covering for Dr. Domenic Polite - Reviewed recent hospitalization notes where he required transfusions and capsule study showed gastropathy and enteropathy and colonoscopy showed AVMs. It appears risks and benefits of anticoagulation were reviewed with him by the admitting team at that time and he wished to stop Eliquis.  Given his recent bleeding issues, this is reasonable but he is at higher risk of having a CVA or thromboembolism. He does have scheduled follow-up with Dr. Domenic Polite next month and at that time, could perhaps consider referral for Watchman device placement if able to tolerate anticoagulation short-term.  Signed, Erma Heritage, PA-C 04/15/2022, 12:57 PM

## 2022-04-16 NOTE — Anesthesia Postprocedure Evaluation (Signed)
Anesthesia Post Note  Patient: Derek Drake  Procedure(s) Performed: COLONOSCOPY WITH PROPOFOL HOT HEMOSTASIS (ARGON PLASMA COAGULATION/BICAP)  Patient location during evaluation: Phase II Anesthesia Type: General Level of consciousness: awake Pain management: pain level controlled Vital Signs Assessment: post-procedure vital signs reviewed and stable Respiratory status: spontaneous breathing and respiratory function stable Cardiovascular status: blood pressure returned to baseline and stable Postop Assessment: no headache and no apparent nausea or vomiting Anesthetic complications: no Comments: Late entry   No notable events documented.   Last Vitals:  Vitals:   04/14/22 1430 04/14/22 1444  BP: (!) 105/58 117/70  Pulse: 64 68  Resp: 15   Temp:    SpO2: 97% 92%    Last Pain:  Vitals:   04/14/22 1420  TempSrc:   PainSc: 0-No pain                 Louann Sjogren

## 2022-04-30 ENCOUNTER — Encounter: Payer: Self-pay | Admitting: *Deleted

## 2022-05-06 ENCOUNTER — Ambulatory Visit (INDEPENDENT_AMBULATORY_CARE_PROVIDER_SITE_OTHER): Payer: Medicare Other | Admitting: Internal Medicine

## 2022-05-06 ENCOUNTER — Encounter: Payer: Self-pay | Admitting: Internal Medicine

## 2022-05-06 VITALS — BP 135/73 | HR 69 | Temp 97.6°F | Ht 71.0 in | Wt 177.4 lb

## 2022-05-06 DIAGNOSIS — D649 Anemia, unspecified: Secondary | ICD-10-CM | POA: Diagnosis not present

## 2022-05-06 NOTE — Patient Instructions (Signed)
It was good to see you again today!  As was recommended by your doctors at the time of discharge, I also recommend you go on a baby aspirin 81 mg daily for some prevention against stroke with your history of atrial fibrillation.  Aspirin is not as good as Eliquis but will less likely cause any issues with bleeding.  Continue nadolol daily  Continue pantoprazole or Protonix 40 mg each morning before breakfast  We will continue taking a picture of your liver every 6 months by ultrasound  Repeat CBC in 2 weeks  Office visit with [place] in 3 months

## 2022-05-06 NOTE — Progress Notes (Signed)
Primary Care Physician:  [name], MD Primary Gastroenterologist:  Dr. Gala Romney  Pre-Procedure History & Physical: HPI:  Derek Drake is a 76 y.o. male with [place]/cirrhosis here for follow-up from recent admission for recurrent GI bleed, known small bowel and colonic AVMs treated endoscopically, in the setting of Eliquis.  Has received multiple units of packed RBCs.  Decision was made to stop the Eliquis during his last hospitalization.  It was also recommended he go on aspirin daily.  He did not start aspirin he did stop Eliquis.  Patient states he feels great.  Has more energy than he has had in a long time.  Stools have been brown in color.  Past Medical History:  Diagnosis Date   Cirrhosis ([place])    Immune to Hep A. Received Hep B vaccination in 2022.   Essential hypertension    GERD (gastroesophageal reflux disease)    Hypercholesteremia    PAF (paroxysmal atrial fibrillation) ([place]) 05/04/2014   Type 2 diabetes mellitus [facility])     Past Surgical History:  Procedure Laterality Date   BIOPSY  02/05/2017   Procedure: BIOPSY;  Surgeon: Daneil Dolin, MD;  [facility];  Service: Endoscopy;;  gastric colon   CHOLECYSTECTOMY     COLONOSCOPY N/A 05/13/2013   Procedure: COLONOSCOPY;  Surgeon: Daneil Dolin, MD;  [facility];  Service: Endoscopy;  Laterality: N/A;  8:30 AM   COLONOSCOPY N/A 02/05/2017   Procedure: COLONOSCOPY;  Surgeon: Daneil Dolin, MD;  [facility];  Service: Endoscopy;  Laterality: N/A;   COLONOSCOPY WITH PROPOFOL N/A 03/26/2022   Procedure: COLONOSCOPY WITH PROPOFOL;  Surgeon: Daneil Dolin, MD;  [facility];  Service: Endoscopy;  Laterality: N/A;  7:30am, moved to 7/12 @ 2:45   COLONOSCOPY WITH PROPOFOL N/A 04/14/2022   Procedure: COLONOSCOPY WITH PROPOFOL;  Surgeon: Eloise Harman, DO;  [facility];  Service: Endoscopy;  Laterality: N/A;   ESOPHAGOGASTRODUODENOSCOPY N/A 02/05/2017    Procedure: ESOPHAGOGASTRODUODENOSCOPY (EGD);  Surgeon: Daneil Dolin, MD;  [facility];  Service: Endoscopy;  Laterality: N/A;  730 - moved to 12:00    ESOPHAGOGASTRODUODENOSCOPY N/A 07/04/2020   Procedure: ESOPHAGOGASTRODUODENOSCOPY (EGD);  Surgeon: Daneil Dolin, MD;  [facility];  Service: Endoscopy;  Laterality: N/A;  12:45pm   ESOPHAGOGASTRODUODENOSCOPY (EGD) WITH PROPOFOL N/A 01/23/2022   3 columns of grade 1-2 varices, portal gastropathy, normal duodenum. Begin Nadolol 20 mg daily.   GIVENS CAPSULE STUDY N/A 07/04/2020   Procedure: GIVENS CAPSULE STUDY;  Surgeon: Daneil Dolin, MD;  [facility];  Service: Endoscopy;  Laterality: N/A;  Elected not to place a capsule today   GIVENS CAPSULE STUDY N/A 04/12/2022   Procedure: GIVENS CAPSULE STUDY;  Surgeon: Harvel Quale, MD;  [facility];  Service: Gastroenterology;  Laterality: N/A;   HOT HEMOSTASIS  03/26/2022   Procedure: HOT HEMOSTASIS (ARGON PLASMA COAGULATION/BICAP);  Surgeon: Daneil Dolin, MD;  [facility];  Service: Endoscopy;;   HOT HEMOSTASIS  04/14/2022   Procedure: HOT HEMOSTASIS (ARGON PLASMA COAGULATION/BICAP);  Surgeon: Eloise Harman, DO;  [facility];  Service: Endoscopy;;   POLYPECTOMY  02/05/2017   Procedure: POLYPECTOMY;  Surgeon: Daneil Dolin, MD;  [facility];  Service: Endoscopy;;  colon    Prior to Admission medications   Medication Sig Start Date End Date Taking? Authorizing Provider  cholecalciferol (VITAMIN D3) 25 MCG (  1000 UNIT) tablet Take 1,000 Units by mouth daily.   Yes [provider]  [employer] IRON 325 (65 Fe) MG tablet Take 325 mg by mouth 2 (two) times daily. 03/21/22  Yes [provider]  diltiazem (CARDIZEM CD) 360 MG 24 hr capsule Take 360 mg by mouth daily.    Yes [provider]  gabapentin (NEURONTIN) 100 MG capsule Take 100 mg by mouth at bedtime. 07/07/19  Yes [provider]  [name] 500 MG CAPS Take 500 mg by mouth daily.   Yes [provider]  metFORMIN (GLUCOPHAGE) 500 MG tablet Take 1,000 mg by mouth 2 (two) times daily with a meal.   Yes [provider]  milk thistle 175 MG tablet Take 175 mg by mouth daily.   Yes [provider]  Multiple Vitamin (MULTIVITAMIN WITH MINERALS) TABS tablet Take 1 tablet by mouth daily.   Yes [provider]  nadolol (CORGARD) 20 MG tablet Take 20 mg by mouth daily.   Yes [provider]  pantoprazole (PROTONIX) 40 MG tablet TAKE 1 TABLET BY MOUTH EVERY DAY Patient taking differently: Take 40 mg by mouth daily. 06/21/21  Yes Mahala Menghini, PA-C  simvastatin (ZOCOR) 40 MG tablet Take 40 mg by mouth every evening.    Yes [provider]    Allergies as of 05/06/2022 - Review Complete 05/06/2022  Allergen Reaction Noted   Rabbit protein Swelling 05/13/2013   Hydrocodone-acetaminophen Rash 12/07/2015   Other Swelling and Rash 05/13/2013   Ivp dye [iodinated contrast media] Other (See Comments) 01/30/2017    Family History  Problem Relation Age of Onset   Prostate cancer Brother    Congestive Heart Failure Mother    Heart failure Brother    Colon cancer Neg Hx     Social History   Socioeconomic History   Marital status: Married    Spouse name: Not on file   Number of children: Not on file   Years of education: Not on file   Highest education level: Not on file  Occupational History   Not on file  Tobacco Use   Smoking status: Former    Packs/day: 2.00    Years: 10.00    Total pack years: 20.00    Types: Cigarettes    Start date: 05/25/1956    Quit date: 05/25/2009    Years since quitting: 12.9   Smokeless tobacco: Current    Types: Chew  Vaping Use   Vaping Use: Never used  Substance and Sexual Activity   Alcohol use: Not Currently   Drug use: No   Sexual activity: Not Currently  Other Topics Concern   Not on file  Social History  Narrative   Not on file   Social Determinants of Health   Financial Resource Strain: Not on file  Food Insecurity: Not on file  Transportation Needs: Not on file  Physical Activity: Not on file  Stress: Not on file  Social Connections: Not on file  Intimate Partner Violence: Not on file    Review of Systems: See HPI, otherwise negative ROS  Physical Exam: BP 135/73 (BP Location: Left Arm, Patient Position: Sitting, Cuff Size: Normal)   Pulse 69   Temp 97.6 F (36.4 C) (Oral)   Ht '5\' 11"'$  (1.803 m)   Wt 177 lb 6.4 oz (80.5 kg)   SpO2 98%   BMI 24.74 kg/m  General:   Alert,  Well-developed, well-nourished, pleasant and cooperative in NAD; accompanied by his  wife. Mouth:  No deformity or lesions. Neck:  Supple; no masses or thyromegaly. No significant cervical adenopathy. Lungs:  Clear throughout to auscultation.   No wheezes, crackles, or rhonchi. No acute distress. Heart:  Regular rate and rhythm; no murmurs, clicks, rubs,  or gallops. Abdomen: Non-distended, normal bowel sounds.  Soft and nontender without appreciable mass or hepatosplenomegaly.  Pulses:  Normal pulses noted. Extremities:  Without clubbing or edema.  Impression/Plan: 76 year old gentleman NASH/cirrhosis.  History of iron deficiency anemia secondary to recurrent occult GI bleeding most likely secondary to AVMs in the setting of anticoagulation.  History of multiple packed RBCs transfused.  Clinically, so far doing well off of Eliquis. Decision was made during his hospitalization to stop anticoagulation.  I reviewed the increased risk of stroke and I do endorse taking at least a baby aspirin daily to to get some of the risks.  Not mention above, his GERD is well controlled on pantoprazole 40 mg daily.  He continues to receive primary prophylaxis against variceal hemorrhage with nonselective beta-blockade.   Recommendations:  As was recommended by your doctors at the time of discharge, I also recommend you  go on a baby aspirin 81 mg daily for some prevention against stroke with your history of atrial fibrillation.  Aspirin is not as good as Eliquis but will less likely cause any issues with bleeding.  Continue nadolol daily  Continue pantoprazole or Protonix 40 mg each morning before breakfast  We will continue taking a picture of your liver every 6 months by ultrasound  Repeat CBC in 2 weeks  So long as he does not experience recurrent GI bleeding, he will not need another EGD or colonoscopy moving forward.  Office visit with [place] in 3 months      Notice: This dictation was prepared with Dragon dictation along with smaller phrase technology. Any transcriptional errors that result from this process are unintentional and may not be corrected upon review.

## 2022-05-20 LAB — CBC WITH DIFFERENTIAL/PLATELET
Absolute Monocytes: 491 cells/uL (ref 200–950)
Basophils Absolute: 10 cells/uL (ref 0–200)
Basophils Relative: 0.4 %
Eosinophils Absolute: 70 cells/uL (ref 15–500)
Eosinophils Relative: 2.7 %
HCT: 26.1 % — ABNORMAL LOW (ref 38.5–50.0)
Hemoglobin: 8.2 g/dL — ABNORMAL LOW (ref 13.2–17.1)
Lymphs Abs: 523 cells/uL — ABNORMAL LOW (ref 850–3900)
MCH: 28.3 pg (ref 27.0–33.0)
MCHC: 31.4 g/dL — ABNORMAL LOW (ref 32.0–36.0)
MCV: 90 fL (ref 80.0–100.0)
MPV: 11.1 fL (ref 7.5–12.5)
Monocytes Relative: 18.9 %
Neutro Abs: 1505 cells/uL (ref 1500–7800)
Neutrophils Relative %: 57.9 %
Platelets: 90 10*3/uL — ABNORMAL LOW (ref 140–400)
RBC: 2.9 10*6/uL — ABNORMAL LOW (ref 4.20–5.80)
RDW: 15.3 % — ABNORMAL HIGH (ref 11.0–15.0)
Total Lymphocyte: 20.1 %
WBC: 2.6 10*3/uL — ABNORMAL LOW (ref 3.8–10.8)

## 2022-05-23 ENCOUNTER — Other Ambulatory Visit: Payer: Self-pay

## 2022-05-23 DIAGNOSIS — D500 Iron deficiency anemia secondary to blood loss (chronic): Secondary | ICD-10-CM

## 2022-06-04 ENCOUNTER — Encounter: Payer: Self-pay | Admitting: *Deleted

## 2022-06-09 NOTE — Progress Notes (Deleted)
Cardiology Office Note  Date: 06/09/2022   ID: Derek Drake, Derek Drake 1946/06/30, MRN [id number]  PCP:  [name], MD  Cardiologist:  [name], MD Electrophysiologist:  None   No chief complaint on file.   History of Present Illness: Derek Drake is a 76 y.o. male last seen in March.  Interval chart reviewed.  He was hospitalized with GI bleed requiring PRBC transfusions, endoscopic evaluation including capsule study showed gastropathy and enteropathy with small bowel and colonic AVMs.  He elected to stop Eliquis after risk-benefit discussion regarding stroke prophylaxis versus recurrent bleeding risk.  Past Medical History:  Diagnosis Date   Cirrhosis ([place])    Immune to Hep A. Received Hep B vaccination in 2022.   Essential hypertension    GERD (gastroesophageal reflux disease)    Hypercholesteremia    PAF (paroxysmal atrial fibrillation) ([place]) 05/04/2014   Type 2 diabetes mellitus [facility])     Past Surgical History:  Procedure Laterality Date   BIOPSY  02/05/2017   Procedure: BIOPSY;  Surgeon: Daneil Dolin, MD;  [facility];  Service: Endoscopy;;  gastric colon   CHOLECYSTECTOMY     COLONOSCOPY N/A 05/13/2013   Procedure: COLONOSCOPY;  Surgeon: Daneil Dolin, MD;  [facility];  Service: Endoscopy;  Laterality: N/A;  8:30 AM   COLONOSCOPY N/A 02/05/2017   Procedure: COLONOSCOPY;  Surgeon: Daneil Dolin, MD;  [facility];  Service: Endoscopy;  Laterality: N/A;   COLONOSCOPY WITH PROPOFOL N/A 03/26/2022   Procedure: COLONOSCOPY WITH PROPOFOL;  Surgeon: Daneil Dolin, MD;  [facility];  Service: Endoscopy;  Laterality: N/A;  7:30am, moved to 7/12 @ 2:45   COLONOSCOPY WITH PROPOFOL N/A 04/14/2022   Procedure: COLONOSCOPY WITH PROPOFOL;  Surgeon: Eloise Harman, DO;  [facility];  Service: Endoscopy;  Laterality: N/A;   ESOPHAGOGASTRODUODENOSCOPY N/A 02/05/2017   Procedure: ESOPHAGOGASTRODUODENOSCOPY  (EGD);  Surgeon: Daneil Dolin, MD;  [facility];  Service: Endoscopy;  Laterality: N/A;  730 - moved to 12:00    ESOPHAGOGASTRODUODENOSCOPY N/A 07/04/2020   Procedure: ESOPHAGOGASTRODUODENOSCOPY (EGD);  Surgeon: Daneil Dolin, MD;  [facility];  Service: Endoscopy;  Laterality: N/A;  12:45pm   ESOPHAGOGASTRODUODENOSCOPY (EGD) WITH PROPOFOL N/A 01/23/2022   3 columns of grade 1-2 varices, portal gastropathy, normal duodenum. Begin Nadolol 20 mg daily.   GIVENS CAPSULE STUDY N/A 07/04/2020   Procedure: GIVENS CAPSULE STUDY;  Surgeon: Daneil Dolin, MD;  [facility];  Service: Endoscopy;  Laterality: N/A;  Elected not to place a capsule today   GIVENS CAPSULE STUDY N/A 04/12/2022   Procedure: GIVENS CAPSULE STUDY;  Surgeon: Harvel Quale, MD;  [facility];  Service: Gastroenterology;  Laterality: N/A;   HOT HEMOSTASIS  03/26/2022   Procedure: HOT HEMOSTASIS (ARGON PLASMA COAGULATION/BICAP);  Surgeon: Daneil Dolin, MD;  [facility];  Service: Endoscopy;;   HOT HEMOSTASIS  04/14/2022   Procedure: HOT HEMOSTASIS (ARGON PLASMA COAGULATION/BICAP);  Surgeon: Eloise Harman, DO;  [facility];  Service: Endoscopy;;   POLYPECTOMY  02/05/2017   Procedure: POLYPECTOMY;  Surgeon: Daneil Dolin, MD;  [facility];  Service: Endoscopy;;  colon    Current Outpatient Medications  Medication Sig Dispense Refill   cholecalciferol (VITAMIN D3) 25 MCG (1000 UNIT) tablet Take 1,000 Units by mouth daily.     CVS IRON 325 (65 Fe) MG tablet Take 325 mg  by mouth 2 (two) times daily.     diltiazem (CARDIZEM CD) 360 MG 24 hr capsule Take 360 mg by mouth daily.      gabapentin (NEURONTIN) 100 MG capsule Take 100 mg by mouth at bedtime.     Krill Oil 500 MG CAPS Take 500 mg by mouth daily.     metFORMIN (GLUCOPHAGE) 500 MG tablet Take 1,000 mg by mouth 2 (two) times daily with a meal.     milk thistle 175 MG tablet  Take 175 mg by mouth daily.     Multiple Vitamin (MULTIVITAMIN WITH MINERALS) TABS tablet Take 1 tablet by mouth daily.     nadolol (CORGARD) 20 MG tablet Take 20 mg by mouth daily.     pantoprazole (PROTONIX) 40 MG tablet TAKE 1 TABLET BY MOUTH EVERY DAY (Patient taking differently: Take 40 mg by mouth daily.) 90 tablet 3   simvastatin (ZOCOR) 40 MG tablet Take 40 mg by mouth every evening.      No current facility-administered medications for this visit.   Allergies:  Rabbit protein, Hydrocodone-acetaminophen, Other, and Ivp dye [iodinated contrast media]   Social History: The patient  reports that he quit smoking about 13 years ago. His smoking use included cigarettes. He started smoking about 66 years ago. He has a 20.00 pack-year smoking history. His smokeless tobacco use includes chew. He reports that he does not currently use alcohol. He reports that he does not use drugs.   Family History: The patient's family history includes Congestive Heart Failure in his mother; Heart failure in his brother; Prostate cancer in his brother.   ROS:  Please see the history of present illness. Otherwise, complete review of systems is positive for {NONE DEFAULTED:18576}.  All other systems are reviewed and negative.   Physical Exam: VS:  There were no vitals taken for this visit., BMI There is no height or weight on file to calculate BMI.  Wt Readings from Last 3 Encounters:  05/06/22 177 lb 6.4 oz (80.5 kg)  04/11/22 179 lb (81.2 kg)  03/20/22 182 lb 12.8 oz (82.9 kg)    General: Patient appears comfortable at rest. HEENT: Conjunctiva and lids normal, oropharynx clear with moist mucosa. Neck: Supple, no elevated JVP or carotid bruits, no thyromegaly. Lungs: Clear to auscultation, nonlabored breathing at rest. Cardiac: Regular rate and rhythm, no S3 or significant systolic murmur, no pericardial rub. Abdomen: Soft, nontender, no hepatomegaly, bowel sounds present, no guarding or  rebound. Extremities: No pitting edema, distal pulses 2+. Skin: Warm and dry. Musculoskeletal: No kyphosis. Neuropsychiatric: Alert and oriented x3, affect grossly appropriate.  ECG:  An ECG dated 03/13/2022 was personally reviewed today and demonstrated:  Sinus rhythm with nonspecific T wave abnormalities and increased voltage.  Recent Labwork: 03/24/2022: ALT 19; AST 27 04/14/2022: BUN 10; Creatinine, Ser 0.98; Potassium 4.1; Sodium 138 05/20/2022: Hemoglobin 8.2; Platelets 90  August 2023: Potassium 4.7, BUN 13, creatinine 0.99, AST 34, ALT 38  Other Studies Reviewed Today:  Echocardiogram 01/25/2021:  1. Left ventricular ejection fraction, by estimation, is 70 to 75%. The  left ventricle has hyperdynamic function. The left ventricle has no  regional wall motion abnormalities. There is mild left ventricular  hypertrophy. Left ventricular diastolic  parameters are indeterminate.   2. Right ventricular systolic function is normal. The right ventricular  size is normal. There is normal pulmonary artery systolic pressure. The  estimated right ventricular systolic pressure is 51.0 mmHg.   3. The mitral valve is grossly normal. Trivial  mitral valve  regurgitation.   4. The aortic valve is tricuspid. Aortic valve regurgitation is not  visualized.   5. The inferior vena cava is normal in size with greater than 50%  respiratory variability, suggesting right atrial pressure of 3 mmHg.   Assessment and Plan:   Medication Adjustments/Labs and Tests Ordered: Current medicines are reviewed at length with the patient today.  Concerns regarding medicines are outlined above.   Tests Ordered: No orders of the defined types were placed in this encounter.   Medication Changes: No orders of the defined types were placed in this encounter.   Disposition:  Follow up {follow up:15908}  Signed, Satira Sark, MD, [facility] 06/09/2022 4:50 PM    [facility] [address] [place], [place] [address] Phone: [phone]; Fax: [phone]

## 2022-06-10 ENCOUNTER — Ambulatory Visit: Payer: Medicare Other | Admitting: Cardiology

## 2022-06-10 DIAGNOSIS — Z8719 Personal history of other diseases of the digestive system: Secondary | ICD-10-CM

## 2022-06-10 DIAGNOSIS — I480 Paroxysmal atrial fibrillation: Secondary | ICD-10-CM

## 2022-06-11 ENCOUNTER — Ambulatory Visit: Payer: Medicare Other | Attending: Cardiology | Admitting: Cardiology

## 2022-06-11 ENCOUNTER — Encounter: Payer: Self-pay | Admitting: Cardiology

## 2022-06-11 VITALS — BP 130/58 | HR 65 | Ht 71.0 in | Wt 183.6 lb

## 2022-06-11 DIAGNOSIS — I480 Paroxysmal atrial fibrillation: Secondary | ICD-10-CM | POA: Diagnosis present

## 2022-06-11 DIAGNOSIS — I10 Essential (primary) hypertension: Secondary | ICD-10-CM

## 2022-06-11 NOTE — Patient Instructions (Signed)
Medication Instructions:  Your physician recommends that you continue on your current medications as directed. Please refer to the Current Medication list given to you today.   Labwork: None today  Testing/Procedures: None today  Follow-Up: 6 months  Any Other Special Instructions Will Be Listed Below (If Applicable).  If you need a refill on your cardiac medications before your next appointment, please call your pharmacy.  

## 2022-06-11 NOTE — Progress Notes (Signed)
Cardiology Office Note  Date: 06/11/2022   ID: Lionardo, Haze 1946/02/04, MRN [id number]  PCP:  [name], MD  Cardiologist:  [name], MD Electrophysiologist:  None   Chief Complaint  Patient presents with   Cardiac follow-up    History of Present Illness: Derek Drake is a 76 y.o. male last seen in March.  He is here for a follow-up visit.  I reviewed the interval chart, he has had trouble with significant GI bleed requiring PRBC transfusion.  Capsule endoscopy and colonoscopy showed evidence of gastropathy and colonic AVMs.  He was taken off Eliquis given high risk of rebleed and presents for follow-up today.  He also mentions that he stopped taking baby aspirin.  Feels much better, does not report any stool changes.  I talked with him about possibility of referral for Watchman device implantation.  He did not want to pursue this at the present time.  Heart rate is regular today and he does not report any major progressive sense of palpitations on Cardizem CD.  Past Medical History:  Diagnosis Date   Cirrhosis ([place])    Immune to Hep A. Received Hep B vaccination in 2022.   Essential hypertension    GERD (gastroesophageal reflux disease)    Hypercholesteremia    PAF (paroxysmal atrial fibrillation) ([place]) 05/04/2014   Type 2 diabetes mellitus [facility])     Past Surgical History:  Procedure Laterality Date   BIOPSY  02/05/2017   Procedure: BIOPSY;  Surgeon: Daneil Dolin, MD;  [facility];  Service: Endoscopy;;  gastric colon   CHOLECYSTECTOMY     COLONOSCOPY N/A 05/13/2013   Procedure: COLONOSCOPY;  Surgeon: Daneil Dolin, MD;  [facility];  Service: Endoscopy;  Laterality: N/A;  8:30 AM   COLONOSCOPY N/A 02/05/2017   Procedure: COLONOSCOPY;  Surgeon: Daneil Dolin, MD;  [facility];  Service: Endoscopy;  Laterality: N/A;   COLONOSCOPY WITH PROPOFOL N/A 03/26/2022   Procedure: COLONOSCOPY WITH PROPOFOL;  Surgeon: Daneil Dolin, MD;  [facility];  Service: Endoscopy;  Laterality: N/A;  7:30am, moved to 7/12 @ 2:45   COLONOSCOPY WITH PROPOFOL N/A 04/14/2022   Procedure: COLONOSCOPY WITH PROPOFOL;  Surgeon: Eloise Harman, DO;  [facility];  Service: Endoscopy;  Laterality: N/A;   ESOPHAGOGASTRODUODENOSCOPY N/A 02/05/2017   Procedure: ESOPHAGOGASTRODUODENOSCOPY (EGD);  Surgeon: Daneil Dolin, MD;  [facility];  Service: Endoscopy;  Laterality: N/A;  730 - moved to 12:00    ESOPHAGOGASTRODUODENOSCOPY N/A 07/04/2020   Procedure: ESOPHAGOGASTRODUODENOSCOPY (EGD);  Surgeon: Daneil Dolin, MD;  [facility];  Service: Endoscopy;  Laterality: N/A;  12:45pm   ESOPHAGOGASTRODUODENOSCOPY (EGD) WITH PROPOFOL N/A 01/23/2022   3 columns of grade 1-2 varices, portal gastropathy, normal duodenum. Begin Nadolol 20 mg daily.   GIVENS CAPSULE STUDY N/A 07/04/2020   Procedure: GIVENS CAPSULE STUDY;  Surgeon: Daneil Dolin, MD;  [facility];  Service: Endoscopy;  Laterality: N/A;  Elected not to place a capsule today   GIVENS CAPSULE STUDY N/A 04/12/2022   Procedure: GIVENS CAPSULE STUDY;  Surgeon: Harvel Quale, MD;  [facility];  Service: Gastroenterology;  Laterality: N/A;   HOT HEMOSTASIS  03/26/2022   Procedure: HOT HEMOSTASIS (ARGON PLASMA COAGULATION/BICAP);  Surgeon: Daneil Dolin, MD;  [facility];  Service: Endoscopy;;   HOT HEMOSTASIS  04/14/2022   Procedure: HOT HEMOSTASIS (ARGON PLASMA COAGULATION/BICAP);  Surgeon: Eloise Harman, DO;  [facility];  Service: Endoscopy;;   POLYPECTOMY  02/05/2017   Procedure: POLYPECTOMY;  Surgeon: Daneil Dolin, MD;  [facility];  Service: Endoscopy;;  colon    Current Outpatient Medications  Medication Sig Dispense Refill   cholecalciferol (VITAMIN D3) 25 MCG (1000 UNIT) tablet Take 1,000 Units by mouth daily.     diltiazem (CARDIZEM CD) 360 MG 24 hr  capsule Take 360 mg by mouth daily.      gabapentin (NEURONTIN) 100 MG capsule Take 100 mg by mouth at bedtime.     Krill Oil 500 MG CAPS Take 500 mg by mouth daily.     metFORMIN (GLUCOPHAGE) 500 MG tablet Take 1,000 mg by mouth 2 (two) times daily with a meal.     milk thistle 175 MG tablet Take 175 mg by mouth daily.     Multiple Vitamin (MULTIVITAMIN WITH MINERALS) TABS tablet Take 1 tablet by mouth daily.     nadolol (CORGARD) 20 MG tablet Take 20 mg by mouth daily.     pantoprazole (PROTONIX) 40 MG tablet TAKE 1 TABLET BY MOUTH EVERY DAY (Patient taking differently: Take 40 mg by mouth daily.) 90 tablet 3   simvastatin (ZOCOR) 40 MG tablet Take 40 mg by mouth every evening.      No current facility-administered medications for this visit.   Allergies:  Rabbit protein, Hydrocodone-acetaminophen, Other, and Ivp dye [iodinated contrast media]   ROS: No chest pain, no syncope.  Physical Exam: VS:  BP (!) 130/58   Pulse 65   Ht '5\' 11"'$  (1.803 m)   Wt 183 lb 9.6 oz (83.3 kg)   SpO2 96%   BMI 25.61 kg/m , BMI Body mass index is 25.61 kg/m.  Wt Readings from Last 3 Encounters:  06/11/22 183 lb 9.6 oz (83.3 kg)  05/06/22 177 lb 6.4 oz (80.5 kg)  04/11/22 179 lb (81.2 kg)    General: Patient appears comfortable at rest. HEENT: Conjunctiva and lids normal. Neck: Supple, no elevated JVP or carotid bruits. Lungs: Clear to auscultation, nonlabored breathing at rest. Cardiac: Regular rate and rhythm, no S3 or significant systolic murmur, no pericardial rub. Extremities: No pitting edema.  ECG:  An ECG dated 03/13/2022 was personally reviewed today and demonstrated:  Sinus rhythm with increased voltage and nonspecific T wave abnormalities.  Recent Labwork: 03/24/2022: ALT 19; AST 27 04/14/2022: BUN 10; Creatinine, Ser 0.98; Potassium 4.1; Sodium 138 05/20/2022: Hemoglobin 8.2; Platelets 90  August 2023: Potassium 4.7, BUN 13, creatinine 0.99, AST 34, ALT 38  Other Studies Reviewed  Today:  Echocardiogram 01/25/2021:  1. Left ventricular ejection fraction, by estimation, is 70 to 75%. The  left ventricle has hyperdynamic function. The left ventricle has no  regional wall motion abnormalities. There is mild left ventricular  hypertrophy. Left ventricular diastolic  parameters are indeterminate.   2. Right ventricular systolic function is normal. The right ventricular  size is normal. There is normal pulmonary artery systolic pressure. The  estimated right ventricular systolic pressure is 55.7 mmHg.   3. The mitral valve is grossly normal. Trivial mitral valve  regurgitation.   4. The aortic valve is tricuspid. Aortic valve regurgitation is not  visualized.   5. The inferior vena cava is normal in size with greater than 50%  respiratory variability, suggesting right atrial pressure of 3 mmHg.   Assessment and Plan:  1.  Paroxysmal atrial fibrillation with CHA2DS2-VASc score of 4.  As discussed  above, there is significant concern for recurrent GI bleed in light of documented AVMs and his presentation requiring PRBC transfusion.  Plan to stay off anticoagulation, also reasonable to stop aspirin as this would not reduce his stroke risk anyway.  I did talk with him about possible referral for Watchman device implantation, he would prefer to hold off on that for now.  Continue Cardizem CD.  2.  Essential hypertension, systolic 169 today.  No changes made to current regimen.  Medication Adjustments/Labs and Tests Ordered: Current medicines are reviewed at length with the patient today.  Concerns regarding medicines are outlined above.   Tests Ordered: No orders of the defined types were placed in this encounter.   Medication Changes: No orders of the defined types were placed in this encounter.   Disposition:  Follow up  6 months.  Signed, Satira Sark, MD, [facility] 06/11/2022 3:15 PM    [facility] at [facility] [address]  [place], [place] [address] Phone: [phone]; Fax: [phone]

## 2022-06-13 ENCOUNTER — Other Ambulatory Visit: Payer: Self-pay | Admitting: Gastroenterology

## 2022-06-13 ENCOUNTER — Other Ambulatory Visit: Payer: Self-pay | Admitting: Internal Medicine

## 2022-07-01 ENCOUNTER — Other Ambulatory Visit: Payer: Self-pay

## 2022-07-01 DIAGNOSIS — D500 Iron deficiency anemia secondary to blood loss (chronic): Secondary | ICD-10-CM

## 2022-07-08 ENCOUNTER — Ambulatory Visit (INDEPENDENT_AMBULATORY_CARE_PROVIDER_SITE_OTHER): Payer: Medicare Other | Admitting: Internal Medicine

## 2022-07-08 ENCOUNTER — Encounter: Payer: Self-pay | Admitting: Internal Medicine

## 2022-07-08 VITALS — BP 139/70 | HR 74 | Temp 97.6°F | Ht 71.0 in | Wt 185.2 lb

## 2022-07-08 DIAGNOSIS — D500 Iron deficiency anemia secondary to blood loss (chronic): Secondary | ICD-10-CM

## 2022-07-08 DIAGNOSIS — I8510 Secondary esophageal varices without bleeding: Secondary | ICD-10-CM | POA: Diagnosis not present

## 2022-07-08 DIAGNOSIS — K7469 Other cirrhosis of liver: Secondary | ICD-10-CM | POA: Diagnosis not present

## 2022-07-08 NOTE — Patient Instructions (Signed)
It was good to see you again today!  CBC in 2 weeks  Liver ultrasound in January 2024  Office visit here with me in 1 year  If you have any interim problems, please do not hesitate to call.

## 2022-07-08 NOTE — Progress Notes (Unsigned)
Primary Care Physician:  [name], MD Primary Gastroenterologist:  Dr.   Pre-Procedure History & Physical: HPI:  Derek Drake is a 76 y.o. male with [name] cirrhosis with secondary pancytopenia, iron deficiency anemia secondary recurrent GI bleeding felt to be secondary to small bowel / colonic AVMs here for follow-up.  Both Eliquis and aspirin have been stopped.  Patient says he feels great like he was 24 years younger.  He has been having brown stools.  No abdominal pain.  Plenty of energy.  He states he is going to see the blood specialist in [place] in the near future.  He is due for CBC in 2 weeks.  Last CBC I have on record from 9 /5/23 showed pancytopenic with white count 2.6 H&H 8.2/ 26. platelet count 90,000.  History of esophageal varices without prior bleeding on a nonselective beta-blocker. Past Medical History:  Diagnosis Date   Cirrhosis ([place])    Immune to Hep A. Received Hep B vaccination in 2022.   Essential hypertension    GERD (gastroesophageal reflux disease)    Hypercholesteremia    PAF (paroxysmal atrial fibrillation) ([place]) 05/04/2014   Type 2 diabetes mellitus [facility])     Past Surgical History:  Procedure Laterality Date   BIOPSY  02/05/2017   Procedure: BIOPSY;  Surgeon: Daneil Dolin, MD;  [facility];  Service: Endoscopy;;  gastric colon   CHOLECYSTECTOMY     COLONOSCOPY N/A 05/13/2013   Procedure: COLONOSCOPY;  Surgeon: Daneil Dolin, MD;  [facility];  Service: Endoscopy;  Laterality: N/A;  8:30 AM   COLONOSCOPY N/A 02/05/2017   Procedure: COLONOSCOPY;  Surgeon: Daneil Dolin, MD;  [facility];  Service: Endoscopy;  Laterality: N/A;   COLONOSCOPY WITH PROPOFOL N/A 03/26/2022   Procedure: COLONOSCOPY WITH PROPOFOL;  Surgeon: Daneil Dolin, MD;  [facility];  Service: Endoscopy;  Laterality: N/A;  7:30am, moved to 7/12 @ 2:45   COLONOSCOPY WITH PROPOFOL N/A 04/14/2022   Procedure: COLONOSCOPY WITH  PROPOFOL;  Surgeon: Eloise Harman, DO;  [facility];  Service: Endoscopy;  Laterality: N/A;   ESOPHAGOGASTRODUODENOSCOPY N/A 02/05/2017   Procedure: ESOPHAGOGASTRODUODENOSCOPY (EGD);  Surgeon: Daneil Dolin, MD;  [facility];  Service: Endoscopy;  Laterality: N/A;  730 - moved to 12:00    ESOPHAGOGASTRODUODENOSCOPY N/A 07/04/2020   Procedure: ESOPHAGOGASTRODUODENOSCOPY (EGD);  Surgeon: Daneil Dolin, MD;  [facility];  Service: Endoscopy;  Laterality: N/A;  12:45pm   ESOPHAGOGASTRODUODENOSCOPY (EGD) WITH PROPOFOL N/A 01/23/2022   3 columns of grade 1-2 varices, portal gastropathy, normal duodenum. Begin Nadolol 20 mg daily.   GIVENS CAPSULE STUDY N/A 07/04/2020   Procedure: GIVENS CAPSULE STUDY;  Surgeon: Daneil Dolin, MD;  [facility];  Service: Endoscopy;  Laterality: N/A;  Elected not to place a capsule today   GIVENS CAPSULE STUDY N/A 04/12/2022   Procedure: GIVENS CAPSULE STUDY;  Surgeon: Harvel Quale, MD;  [facility];  Service: Gastroenterology;  Laterality: N/A;   HOT HEMOSTASIS  03/26/2022   Procedure: HOT HEMOSTASIS (ARGON PLASMA COAGULATION/BICAP);  Surgeon: Daneil Dolin, MD;  [facility];  Service: Endoscopy;;   HOT HEMOSTASIS  04/14/2022   Procedure: HOT HEMOSTASIS (ARGON PLASMA COAGULATION/BICAP);  Surgeon: Eloise Harman, DO;  [facility];  Service: Endoscopy;;   POLYPECTOMY  02/05/2017   Procedure: POLYPECTOMY;  Surgeon: Daneil Dolin, MD;  [facility];  Service: Endoscopy;;  colon    Prior to Admission medications   Medication Sig Start Date End Date Taking? Authorizing Provider  cholecalciferol (VITAMIN D3) 25 MCG (1000 UNIT) tablet Take 1,000 Units by mouth daily.   Yes [provider]  [employer] IRON 325 (65 Fe) MG tablet Take 325 mg by mouth 2 (two) times daily. 06/17/22  Yes [provider]  diltiazem (CARDIZEM CD) 360 MG 24 hr capsule  Take 360 mg by mouth daily.    Yes [provider]  gabapentin (NEURONTIN) 100 MG capsule Take 100 mg by mouth at bedtime. 07/07/19  Yes [provider]  [name] 500 MG CAPS Take 500 mg by mouth daily.   Yes [provider]  losartan (COZAAR) 100 MG tablet Take 100 mg by mouth daily. 06/17/22  Yes [provider]  metFORMIN (GLUCOPHAGE) 500 MG tablet Take 1,000 mg by mouth 2 (two) times daily with a meal.   Yes [provider]  milk thistle 175 MG tablet Take 175 mg by mouth daily.   Yes [provider]  Multiple Vitamin (MULTIVITAMIN WITH MINERALS) TABS tablet Take 1 tablet by mouth daily.   Yes [provider]  nadolol (CORGARD) 20 MG tablet TAKE 1 TABLET BY MOUTH DAILY 06/13/22  Yes Erenest Rasher, PA-C  pantoprazole (PROTONIX) 40 MG tablet Take 1 tablet (40 mg total) by mouth daily before breakfast. 06/13/22  Yes Jodi Mourning, Kristen S, PA-C  simvastatin (ZOCOR) 40 MG tablet Take 40 mg by mouth every evening.    Yes [provider]    Allergies as of 07/08/2022 - Review Complete 07/08/2022  Allergen Reaction Noted   Rabbit protein Swelling 05/13/2013   Hydrocodone-acetaminophen Rash 12/07/2015   Other Swelling and Rash 05/13/2013   Ivp dye [iodinated contrast media] Other (See Comments) 01/30/2017    Family History  Problem Relation Age of Onset   Prostate cancer Brother    Congestive Heart Failure Mother    Heart failure Brother    Colon cancer Neg Hx     Social History   Socioeconomic History   Marital status: Married    Spouse name: Not on file   Number of children: Not on file   Years of education: Not on file   Highest education level: Not on file  Occupational History   Not on file  Tobacco Use   Smoking status: Former    Packs/day: 2.00    Years: 10.00    Total pack years: 20.00    Types: Cigarettes    Start date: 05/25/1956    Quit date: 05/25/2009    Years since quitting: 13.1   Smokeless  tobacco: Current    Types: Chew  Vaping Use   Vaping Use: Never used  Substance and Sexual Activity   Alcohol use: Not Currently   Drug use: No   Sexual activity: Not Currently  Other Topics Concern   Not on file  Social History Narrative   Not on file   Social Determinants of Health   Financial Resource Strain: Not on file  Food Insecurity: Not on file  Transportation Needs: Not on file  Physical Activity: Not on file  Stress: Not on file  Social Connections: Not on file  Intimate Partner Violence: Not on file    Review of Systems: See HPI, otherwise negative ROS  Physical Exam: BP 139/70 (BP Location: Right Arm, Patient Position: Sitting, Cuff Size: Normal)   Pulse 74   Temp 97.6 F (  36.4 C) (Oral)   Ht '5\' 11"'$  (1.803 m)   Wt 185 lb 3.2 oz (84 kg)   SpO2 100%   BMI 25.83 kg/m  General:   Alert,   well-nourished, pleasant and cooperative in NAD; still has somewhat pale conjunctiva. Neck:  Supple; no masses or thyromegaly. No significant cervical adenopathy. Lungs:  Clear throughout to auscultation.   No wheezes, crackles, or rhonchi. No acute distress. Heart:  Regular rate and rhythm; no murmurs, clicks, rubs,  or gallops. Abdomen: Non-distended, normal bowel sounds.  Soft and nontender without appreciable mass or hepatosplenomegaly.  Pulses:  Normal pulses noted. Extremities:  Without clubbing or edema.  Impression/Plan: Pleasant 76 year old gentleman NASH cirrhosis ,portal hypertension as evidenced by esophageal varices without prior variceal hemorrhage returns after episode of GI bleeding felt to be secondary to AVMs in the setting of aspirin and Eliquis.  Both these agents have been stopped.  Patient feels great.  Clinically, no evidence of ongoing bleeding.  He remains pancytopenic.  This is likely large part to chronic liver disease.  By his description, he is seeing the hematologist in the near future which I think is a good idea.  Recommendations:  CBC in 2  weeks  Liver ultrasound in January 2024; every 6 months thereafter for hepatoma screening  Office visit here with me in 1 year  If any interim problems, he is to call.    Notice: This dictation was prepared with Dragon dictation along with smaller phrase technology. Any transcriptional errors that result from this process are unintentional and may not be corrected upon review.

## 2022-07-18 LAB — CBC WITH DIFFERENTIAL/PLATELET
Absolute Monocytes: 740 cells/uL (ref 200–950)
Basophils Absolute: 11 cells/uL (ref 0–200)
Basophils Relative: 0.3 %
Eosinophils Absolute: 111 cells/uL (ref 15–500)
Eosinophils Relative: 3 %
HCT: 29.2 % — ABNORMAL LOW (ref 38.5–50.0)
Hemoglobin: 9.5 g/dL — ABNORMAL LOW (ref 13.2–17.1)
Lymphs Abs: 648 cells/uL — ABNORMAL LOW (ref 850–3900)
MCH: 29.7 pg (ref 27.0–33.0)
MCHC: 32.5 g/dL (ref 32.0–36.0)
MCV: 91.3 fL (ref 80.0–100.0)
MPV: 11.5 fL (ref 7.5–12.5)
Monocytes Relative: 20 %
Neutro Abs: 2190 cells/uL (ref 1500–7800)
Neutrophils Relative %: 59.2 %
Platelets: 93 10*3/uL — ABNORMAL LOW (ref 140–400)
RBC: 3.2 10*6/uL — ABNORMAL LOW (ref 4.20–5.80)
RDW: 14.3 % (ref 11.0–15.0)
Total Lymphocyte: 17.5 %
WBC: 3.7 10*3/uL — ABNORMAL LOW (ref 3.8–10.8)

## 2022-07-21 ENCOUNTER — Other Ambulatory Visit: Payer: Self-pay

## 2022-07-21 DIAGNOSIS — D500 Iron deficiency anemia secondary to blood loss (chronic): Secondary | ICD-10-CM

## 2022-08-18 ENCOUNTER — Telehealth: Payer: Self-pay | Admitting: Internal Medicine

## 2022-08-18 NOTE — Telephone Encounter (Signed)
Recall sent 

## 2022-08-19 ENCOUNTER — Other Ambulatory Visit: Payer: Self-pay | Admitting: *Deleted

## 2022-08-19 DIAGNOSIS — K7460 Unspecified cirrhosis of liver: Secondary | ICD-10-CM

## 2022-08-19 NOTE — Telephone Encounter (Signed)
Pt's spouse Zigmund Daniel, was informed that [place] is scheduled for 08/26/22 at 10:30 am, arrive at 10:15 am, nothing to eat or drink 6-8 hours prior to procedure. Verbalized understanding.

## 2022-08-26 ENCOUNTER — Ambulatory Visit (HOSPITAL_COMMUNITY)
Admission: RE | Admit: 2022-08-26 | Discharge: 2022-08-26 | Disposition: A | Discharge status: Home / Self Care | Payer: Medicare Other | Source: Ambulatory Visit | Attending: Gastroenterology | Admitting: Gastroenterology

## 2022-08-26 DIAGNOSIS — K7581 Nonalcoholic steatohepatitis (NASH): Secondary | ICD-10-CM | POA: Insufficient documentation

## 2022-08-26 DIAGNOSIS — K7460 Unspecified cirrhosis of liver: Secondary | ICD-10-CM | POA: Insufficient documentation

## 2022-09-05 ENCOUNTER — Other Ambulatory Visit: Payer: Self-pay

## 2022-09-05 DIAGNOSIS — D500 Iron deficiency anemia secondary to blood loss (chronic): Secondary | ICD-10-CM

## 2022-09-23 ENCOUNTER — Other Ambulatory Visit: Payer: Self-pay

## 2022-09-23 DIAGNOSIS — D500 Iron deficiency anemia secondary to blood loss (chronic): Secondary | ICD-10-CM

## 2022-09-23 LAB — CBC WITH DIFFERENTIAL/PLATELET
Absolute Monocytes: 609 cells/uL (ref 200–950)
Basophils Absolute: 9 cells/uL (ref 0–200)
Basophils Relative: 0.3 %
Eosinophils Absolute: 111 cells/uL (ref 15–500)
Eosinophils Relative: 3.7 %
HCT: 28.5 % — ABNORMAL LOW (ref 38.5–50.0)
Hemoglobin: 9.4 g/dL — ABNORMAL LOW (ref 13.2–17.1)
Lymphs Abs: 528 cells/uL — ABNORMAL LOW (ref 850–3900)
MCH: 31 pg (ref 27.0–33.0)
MCHC: 33 g/dL (ref 32.0–36.0)
MCV: 94.1 fL (ref 80.0–100.0)
MPV: 11.7 fL (ref 7.5–12.5)
Monocytes Relative: 20.3 %
Neutro Abs: 1743 cells/uL (ref 1500–7800)
Neutrophils Relative %: 58.1 %
Platelets: 78 10*3/uL — ABNORMAL LOW (ref 140–400)
RBC: 3.03 10*6/uL — ABNORMAL LOW (ref 4.20–5.80)
RDW: 14 % (ref 11.0–15.0)
Total Lymphocyte: 17.6 %
WBC: 3 10*3/uL — ABNORMAL LOW (ref 3.8–10.8)

## 2022-12-16 NOTE — Progress Notes (Unsigned)
    Cardiology Office Note  Date: 12/17/2022   ID: Derek Drake 01/07/1946, MRN [id number]  History of Present Illness: Derek Drake is a 77 y.o. male last seen in September 2023.  He is here for a follow-up visit.  Reports no significant palpitations on current regimen.  No exertional chest pain or breathlessness beyond NYHA class II.  I reviewed his current medications and also most recent lab work.  He has had interval follow-up with hematology and GI follow-up pending.  Does not report any definite change in stools.  He is still on supplemental iron.  Last hemoglobin was up to 10.2.  Reviewed his ECG today which shows sinus rhythm with increased voltage.  Physical Exam: VS:  BP (!) 144/70   Pulse 75   Ht 5\' 11"  (1.803 m)   Wt 177 lb (80.3 kg)   SpO2 99%   BMI 24.69 kg/m , BMI Body mass index is 24.69 kg/m.  Wt Readings from Last 3 Encounters:  12/17/22 177 lb (80.3 kg)  07/08/22 185 lb 3.2 oz (84 kg)  06/11/22 183 lb 9.6 oz (83.3 kg)    General: Patient appears comfortable at rest. HEENT: Conjunctiva and lids normal. Neck: Supple, no elevated JVP or carotid bruits. Lungs: Clear to auscultation, nonlabored breathing at rest. Cardiac: Regular rate and rhythm, no S3 or significant systolic murmur. Extremities: No pitting edema.  ECG:  An ECG dated 03/13/2022 was personally reviewed today and demonstrated:  Sinus rhythm with nonspecific ST-T changes and LVH.  Labwork: September 2022: Cholesterol 123, triglycerides 123, HDL 38, LDL 63 03/24/2022: ALT 19; AST 27 04/14/2022: BUN 10; Creatinine, Ser 0.98; Potassium 4.1; Sodium 138 09/22/2022: Hemoglobin 9.4; Platelets 78  January 2024: Hemoglobin 10.2, platelets 106, potassium 4.7, BUN 14, creatinine 1.07  Other Studies Reviewed Today:  No interval cardiac testing for review today.  Assessment and Plan:  1.  Paroxysmal atrial fibrillation with CHA2DS2-VASc score of 4.  Anticoagulation has been discontinued in light of  recurrent GI bleeding with documented AVMs requiring PRBC transfusion.  We discussed possibility of Watchman device consultation, although he prefers to continue the present course of observation at this point.  Continue Cardizem CD and Corgard.  2.  Essential hypertension.  Also on losartan.  Keep follow-up with Dr. Gerarda Fraction.  3.  Mixed hyperlipidemia.  On Zocor.  LDL has been controlled over the years, 58 in 2022.  I do not have more recent lab work for review.  Keep follow-up with Dr. Gerarda Fraction.  Disposition:  Follow up  6 months.  Signed, Derek Drake, M.D., F.A.C.C. [facility]

## 2022-12-17 ENCOUNTER — Ambulatory Visit: Payer: Medicare Other | Attending: Cardiology | Admitting: Cardiology

## 2022-12-17 ENCOUNTER — Encounter: Payer: Self-pay | Admitting: Cardiology

## 2022-12-17 VITALS — BP 144/70 | HR 75 | Ht 71.0 in | Wt 177.0 lb

## 2022-12-17 DIAGNOSIS — I10 Essential (primary) hypertension: Secondary | ICD-10-CM | POA: Diagnosis not present

## 2022-12-17 DIAGNOSIS — I480 Paroxysmal atrial fibrillation: Secondary | ICD-10-CM

## 2022-12-17 NOTE — Patient Instructions (Signed)
Medication Instructions:  Your physician recommends that you continue on your current medications as directed. Please refer to the Current Medication list given to you today.   Labwork: None  Testing/Procedures: None  Follow-Up: Follow up with Dr. McDowell in 6 months.   Any Other Special Instructions Will Be Listed Below (If Applicable).     If you need a refill on your cardiac medications before your next appointment, please call your pharmacy.  

## 2023-01-08 ENCOUNTER — Other Ambulatory Visit: Payer: Self-pay | Admitting: Gastroenterology

## 2023-01-21 ENCOUNTER — Other Ambulatory Visit (HOSPITAL_COMMUNITY): Payer: Self-pay | Admitting: Internal Medicine

## 2023-01-21 DIAGNOSIS — K766 Portal hypertension: Secondary | ICD-10-CM

## 2023-01-21 DIAGNOSIS — G8929 Other chronic pain: Secondary | ICD-10-CM

## 2023-01-27 ENCOUNTER — Ambulatory Visit (HOSPITAL_COMMUNITY)
Admission: RE | Admit: 2023-01-27 | Discharge: 2023-01-27 | Disposition: A | Discharge status: Home / Self Care | Payer: Medicare Other | Source: Ambulatory Visit | Attending: Internal Medicine | Admitting: Internal Medicine

## 2023-01-27 ENCOUNTER — Encounter: Payer: Self-pay | Admitting: Internal Medicine

## 2023-01-27 DIAGNOSIS — G8929 Other chronic pain: Secondary | ICD-10-CM

## 2023-01-27 DIAGNOSIS — K766 Portal hypertension: Secondary | ICD-10-CM

## 2023-01-27 NOTE — Progress Notes (Signed)
Patient has a possible contrast allergy. Needs pre meds per Radiologist Dr. Si Gaul. Dr. Sherwood Gambler office notified spoke with Melody. Pre med regimen faxed to office. Patient has been rescheduled. I spoke to patient and wife about pre meds.

## 2023-01-29 ENCOUNTER — Ambulatory Visit (HOSPITAL_COMMUNITY): Payer: Medicare Other

## 2023-02-03 ENCOUNTER — Ambulatory Visit (HOSPITAL_COMMUNITY)
Admission: RE | Admit: 2023-02-03 | Discharge: 2023-02-03 | Disposition: A | Discharge status: Home / Self Care | Payer: Medicare Other | Source: Ambulatory Visit | Attending: Internal Medicine | Admitting: Internal Medicine

## 2023-02-03 DIAGNOSIS — R1013 Epigastric pain: Secondary | ICD-10-CM | POA: Insufficient documentation

## 2023-02-03 DIAGNOSIS — K766 Portal hypertension: Secondary | ICD-10-CM | POA: Insufficient documentation

## 2023-02-03 DIAGNOSIS — G8929 Other chronic pain: Secondary | ICD-10-CM | POA: Insufficient documentation

## 2023-02-03 LAB — POCT I-STAT CREATININE: Creatinine, Ser: 1.1 mg/dL (ref 0.61–1.24)

## 2023-02-03 MED ORDER — IOHEXOL 300 MG/ML  SOLN
100.0000 mL | Freq: Once | INTRAMUSCULAR | Status: AC | PRN
  Administered 2023-02-03: 100 mL via INTRAVENOUS

## 2023-02-04 ENCOUNTER — Ambulatory Visit (INDEPENDENT_AMBULATORY_CARE_PROVIDER_SITE_OTHER): Payer: Medicare Other | Admitting: Gastroenterology

## 2023-02-04 ENCOUNTER — Encounter: Payer: Self-pay | Admitting: Gastroenterology

## 2023-02-04 VITALS — BP 155/76 | HR 67 | Temp 97.5°F | Ht 71.0 in | Wt 179.2 lb

## 2023-02-04 DIAGNOSIS — K7460 Unspecified cirrhosis of liver: Secondary | ICD-10-CM

## 2023-02-04 DIAGNOSIS — K7581 Nonalcoholic steatohepatitis (NASH): Secondary | ICD-10-CM | POA: Diagnosis not present

## 2023-02-04 DIAGNOSIS — D500 Iron deficiency anemia secondary to blood loss (chronic): Secondary | ICD-10-CM

## 2023-02-04 LAB — CBC WITH DIFFERENTIAL/PLATELET
Absolute Monocytes: 886 cells/uL (ref 200–950)
Eosinophils Relative: 0.5 %
MCH: 30.8 pg (ref 27.0–33.0)
Neutrophils Relative %: 72.3 %
RBC: 2.89 10*6/uL — ABNORMAL LOW (ref 4.20–5.80)
RDW: 13.6 % (ref 11.0–15.0)

## 2023-02-04 LAB — PROTIME-INR
INR: 1
Prothrombin Time: 10.8 s (ref 9.0–11.5)

## 2023-02-04 NOTE — Patient Instructions (Signed)
The new medication your PCP is starting, Creon, is pancreatic enzymes. This will help break down fats in your food. Sometimes people with low pancreatic enzymes will have diarrhea, fat in the stool, weight loss. You will need to take one capsule with each meal, with the first bite of food. If you note any issues with ongoing weight loss, loose or frequent stools, we can increase the dose to two with meals and one with snacks. Your PCP currently started you on a very low dose based on your weight.  Please go for labs today. We will be in touch with results, it may be later next week with the holiday weekend coming up.  Increase your iron to twice a day. Call if you have abdominal pain, fever, recurrent nausea/appetite issues, weight loss, black or bloody stools.

## 2023-02-04 NOTE — Progress Notes (Signed)
GI Office Note    Referring Provider: Elfredia Nevins, MD Primary Care Physician:  Elfredia Nevins, MD  Primary Gastroenterologist: Roetta Sessions, MD   Chief Complaint   Chief Complaint  Patient presents with   Cirrhosis     History of Present Illness   Derek Drake is a 77 y.o. male presenting today at the request of Dr. Sherwood Gambler for pancreatic insufficiency and portal hypertension.  Patient last seen in our office in October 2023.  He has a history of Elita Boone cirrhosis with secondary pancytopenia, iron deficiency anemia secondary to recurrent GI bleed thought to be secondary to small bowel/colonic AVMs.  History of esophageal varices without prior bleeding on a nonselective beta-blocker.  Previously had been on both aspirin and Eliquis, these have since been stopped.  Patient presents today with complaint of appetite issues recently although currently back to normal. Recently he felt like food didn't taste normal and tasted bad at lunch and supper. Associated with nausea. Felt like at breakfast his appetite was normal. He believes his symptoms could have been due to anxiety. Had not been sleeping well. 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 He has noted with distraction, he does better. Has been getting outside helping wife planting shrubs and now seems that his appetite is back to normal. He has good energy. No abdominal pain. BM regular. No melena, brbpr. Weight up few pounds.   Notes his PCP obtained CT and labs for nausea/appetite issues, as outlined below. Started him on creon which he has not started.  Went to hematology in [place]: said to take iron every 3 days. Drop in counts so it was increased to one per day. Plans to go back to BID like we recommended.      Wt Readings from Last 10 Encounters:  02/04/23 179 lb 3.2 oz (81.3 kg)  12/17/22 177 lb (80.3 kg)  07/08/22 185 lb 3.2 oz (84 kg)  06/11/22 183 lb 9.6 oz (83.3 kg)  05/06/22 177 lb 6.4 oz (80.5 kg)   04/11/22 179 lb (81.2 kg)  03/20/22 182 lb 12.8 oz (82.9 kg)  03/13/22 181 lb (82.1 kg)  03/03/22 181 lb (82.1 kg)  01/21/22 195 lb (88.5 kg)    CT abdomen pelvis with contrast yesterday for nausea, food not tasting right, ordered by PCP: IMPRESSION: 1. Cirrhosis with evidence of portal hypertension, splenomegaly, and trace ascites. 2. Mild peripancreatic stranding, likely related to ascites and cirrhosis. Correlation with pancreatic enzymes recommended to exclude pancreatitis. 3. Sigmoid diverticulosis. No bowel obstruction. Normal appendix. 4. A 7 mm nonobstructing right renal upper pole calculus. No hydronephrosis. 5.  Aortic Atherosclerosis (ICD10-I70.0).  Labs from May 2024: Glucose 133, creatinine 1.06, sodium 137, albumin 4.1, total bilirubin 0.4, alkaline phosphatase 95, AST 32, ALT 21, white blood cell count 3200, hemoglobin 9.2, hematocrit 27.3, platelets 80,000, B12 920, folate greater than 20, amylase 33, lipase 24, fecal pancreatic elastase, 199.  Labs from January 2024: TIBC 427, iron saturation 21%, serum iron 90, B12 1116  EGD Jan 23 2022: Columns of grade 1-2 varices, portal gastropathy, normal duodenum.  Began nadolol 20 mg daily.  Colonoscopy April 14 2022: -Nonbleeding internal hemorrhoids -Single ulcer in the cecum -5 nonbleeding colonic angiodysplastic lesions treated with APC  Colonoscopy March 26, 2022: -Cecal AVMs, 1 actively bleeding status post ablation and clipped. -relatively prominent vascular pattern throughout the colon likely secondary to cirrhosis  Capsule endoscopy July 2023: Presence of diffuse portal hypertensive gastropathy without active bleeding.  Evidence of multiple areas of congestion erythematous enteropathy without active bleeding, worse in the proximal small bowel.  Presence of medium size nonbleeding colonic AVM.  Presence of polypoid colonic tissue in the proximal colon.  Proceed with colonoscopy.  Medications   Current Outpatient  Medications  Medication Sig Dispense Refill   cholecalciferol (VITAMIN D3) 25 MCG (1000 UNIT) tablet Take 1,000 Units by mouth daily.     CREON 36000-114000 units CPEP capsule Take by mouth.     [employer] IRON 325 (65 Fe) MG tablet Take 325 mg by mouth 2 (two) times daily.     diltiazem (CARDIZEM CD) 360 MG 24 hr capsule Take 360 mg by mouth daily.      gabapentin (NEURONTIN) 100 MG capsule Take 100 mg by mouth at bedtime.     Krill Oil 500 MG CAPS Take 500 mg by mouth daily.     losartan (COZAAR) 100 MG tablet Take 100 mg by mouth daily.     metFORMIN (GLUCOPHAGE) 500 MG tablet Take 1,000 mg by mouth 2 (two) times daily with a meal.     milk thistle 175 MG tablet Take 175 mg by mouth daily.     Multiple Vitamin (MULTIVITAMIN WITH MINERALS) TABS tablet Take 1 tablet by mouth daily.     nadolol (CORGARD) 20 MG tablet TAKE 1 TABLET BY MOUTH EVERY DAY 90 tablet 3   pantoprazole (PROTONIX) 40 MG tablet Take 1 tablet (40 mg total) by mouth daily before breakfast. 90 tablet 3   simvastatin (ZOCOR) 40 MG tablet Take 40 mg by mouth every evening.      No current facility-administered medications for this visit.    Allergies   Allergies as of 02/04/2023 - Review Complete 02/04/2023  Allergen Reaction Noted   Rabbit protein Swelling 05/13/2013   Hydrocodone-acetaminophen Rash 12/07/2015   Other Swelling and Rash 05/13/2013   Ivp dye [iodinated contrast media] Other (See Comments) 01/30/2017      Review of Systems   General: Negative for anorexia, fever, chills, fatigue, weakness. See hpi Eyes: Negative for vision changes.  ENT: Negative for hoarseness, difficulty swallowing , nasal congestion. CV: Negative for chest pain, angina, palpitations, dyspnea on exertion, +peripheral edema.  Respiratory: Negative for dyspnea at rest, dyspnea on exertion, cough, sputum, wheezing.  GI: See history of present illness. GU:  Negative for dysuria, hematuria, urinary incontinence, urinary frequency, nocturnal  urination.  MS: Negative for joint pain, low back pain.  Derm: Negative for rash or itching.  Neuro: Negative for weakness, abnormal sensation, seizure, frequent headaches, memory loss,  confusion.  Psych: Negative for  depression, suicidal ideation, hallucinations. +anxiety Endo: Negative for unusual weight change.  Heme: Negative for bruising or bleeding. Allergy: Negative for rash or hives.  Physical Exam   BP (!) 155/76 (BP Location: Right Arm, Patient Position: Sitting, Cuff Size: Normal)   Pulse 67   Temp (!) 97.5 F (36.4 C) (Oral)   Ht 5\' 11"  (1.803 m)   Wt 179 lb 3.2 oz (81.3 kg)   SpO2 99%   BMI 24.99 kg/m    General: Well-nourished, well-developed in no acute distress.  Head: Normocephalic, atraumatic.   Eyes: Conjunctiva pink, no icterus. Mouth: Oropharyngeal mucosa moist and pink   Neck: Supple without thyromegaly, masses, or lymphadenopathy.  Lungs: Clear to auscultation bilaterally.  Heart: Regular rate and rhythm, no murmurs rubs or gallops.  Abdomen: Bowel sounds are normal, nontender, nondistended, no hepatosplenomegaly or masses,  no abdominal bruits or hernia, no rebound or  guarding.   Rectal: not performed Extremities: trace bilateral lower extremity edema. No clubbing or deformities.  Neuro: Alert and oriented x 4 , grossly normal neurologically.  Skin: Warm and dry, no rash or jaundice.   Psych: Alert and cooperative, normal mood and affect.  Labs   See hpi  Imaging Studies   CT ABDOMEN PELVIS W CONTRAST  Result Date: 02/03/2023 CLINICAL DATA:  Nausea. Patient says foot does not taste right. Denies abdominal pain. EXAM: CT ABDOMEN AND PELVIS WITH CONTRAST TECHNIQUE: Multidetector CT imaging of the abdomen and pelvis was performed using the standard protocol following bolus administration of intravenous contrast. RADIATION DOSE REDUCTION: This exam was performed according to the departmental dose-optimization program which includes automated exposure  control, adjustment of the mA and/or kV according to patient size and/or use of iterative reconstruction technique. CONTRAST:  OMNIPAQUE IOHEXOL 300 MG/ML  SOLN COMPARISON:  Abdominal ultrasound dated 08/26/2022. FINDINGS: Lower chest: The visualized lung bases are clear. There is advanced coronary vascular calcification. No intra-abdominal free air.  Trace ascites. Hepatobiliary: Cirrhosis. There is mild biliary dilatation, post cholecystectomy. No retained calcified stone noted in the central CBD. Pancreas: Mild peripancreatic stranding, likely related to ascites and cirrhosis. Correlation with pancreatic enzymes recommended to exclude pancreatitis. Spleen: Splenomegaly measuring 18 cm in length. Adrenals/Urinary Tract: The adrenal glands unremarkable. There is a 7 mm nonobstructing right renal upper pole calculus. No hydronephrosis. The left kidney is unremarkable. There is symmetric enhancement and excretion of contrast by both kidneys. The visualized ureters and urinary bladder appear unremarkable. Stomach/Bowel: There is sigmoid diverticulosis without active inflammatory changes. Mild thickened appearance of the ascending colon, likely related to underdistention. Mild colitis is less likely. Clinical correlation is recommended. There is no bowel obstruction. The appendix is normal. Vascular/Lymphatic: Moderate aortoiliac atherosclerotic disease. The IVC is unremarkable. The SMV, splenic vein, and main portal vein are patent. No portal venous gas. Recanalization of the umbilical vein indicative of portal hypertension. Mildly enlarged retroperitoneal lymph nodes, nonspecific, likely reactive. Clinical correlation is recommended. Left external iliac lymph node measures 15 mm in short axis. Reproductive: The prostate and seminal vesicles are grossly unremarkable. No pelvic mass. Other: None Musculoskeletal: Degenerative changes of the spine. No acute osseous pathology. IMPRESSION: 1. Cirrhosis with evidence  of portal hypertension, splenomegaly, and trace ascites. 2. Mild peripancreatic stranding, likely related to ascites and cirrhosis. Correlation with pancreatic enzymes recommended to exclude pancreatitis. 3. Sigmoid diverticulosis. No bowel obstruction. Normal appendix. 4. A 7 mm nonobstructing right renal upper pole calculus. No hydronephrosis. 5.  Aortic Atherosclerosis (ICD10-I70.0). Electronically Signed   By: Elgie Collard M.D.   On: 02/03/2023 19:57    Assessment   Cirrhosis: complicated by portal HTN/esophageal varices without prior variceal hemorrhage. Recent liver image with no hepatoma.   IDA: with history of prior GI bleeding felt to be due to AVMs in setting of ASA and Eliquis. Both agents have been stopped. No overt GI bleeding. He reports seeing hematologist in [place] but not sure if he plans to go back. Currently plans to increase his iron to BID as per our previous recommendations, stating hematologist had decreased it to every 3 days.   Nausea/change in appetite: patient reports symptoms have resolved. He believes could be related to anxiety. CT unrevealing for cause. Pancreatic elastase was slightly low at 199. PCP prescribed Creon 36000 one with each meal. Patient has not started. He denies diarrhea, weight loss. He may choose to try Creon but at this time appears he is  doing well.    PLAN   Update labs.  Increase iron to twice daily. He can take Creon if he desires, he does not really have symptoms suggestive of EPI and his fecal elastase was slightly low at 199 (200 and above normal). Patient provided proper instructions on use of Creon.  He will call if any abdominal pain, fever, weight loss, bloody stools, recurrent appetite concerns.    Leanna Battles. Melvyn Neth, MHS, PA-C [facility]

## 2023-02-05 LAB — CBC WITH DIFFERENTIAL/PLATELET
Basophils Absolute: 11 cells/uL (ref 0–200)
Basophils Relative: 0.2 %
Eosinophils Absolute: 28 cells/uL (ref 15–500)
HCT: 26.8 % — ABNORMAL LOW (ref 38.5–50.0)
Hemoglobin: 8.9 g/dL — ABNORMAL LOW (ref 13.2–17.1)
Lymphs Abs: 600 cells/uL — ABNORMAL LOW (ref 850–3900)
MCHC: 33.2 g/dL (ref 32.0–36.0)
MCV: 92.7 fL (ref 80.0–100.0)
MPV: 11.6 fL (ref 7.5–12.5)
Monocytes Relative: 16.1 %
Neutro Abs: 3977 cells/uL (ref 1500–7800)
Platelets: 98 10*3/uL — ABNORMAL LOW (ref 140–400)
Total Lymphocyte: 10.9 %
WBC: 5.5 10*3/uL (ref 3.8–10.8)

## 2023-02-05 LAB — COMPREHENSIVE METABOLIC PANEL
AG Ratio: 2.1 (calc) (ref 1.0–2.5)
ALT: 22 U/L (ref 9–46)
AST: 27 U/L (ref 10–35)
Albumin: 4.2 g/dL (ref 3.6–5.1)
Alkaline phosphatase (APISO): 90 U/L (ref 35–144)
BUN: 18 mg/dL (ref 7–25)
CO2: 25 mmol/L (ref 20–32)
Calcium: 11 mg/dL — ABNORMAL HIGH (ref 8.6–10.3)
Chloride: 101 mmol/L (ref 98–110)
Creat: 1.04 mg/dL (ref 0.70–1.28)
Globulin: 2 g/dL (calc) (ref 1.9–3.7)
Glucose, Bld: 117 mg/dL (ref 65–139)
Potassium: 4.6 mmol/L (ref 3.5–5.3)
Sodium: 137 mmol/L (ref 135–146)
Total Bilirubin: 0.4 mg/dL (ref 0.2–1.2)
Total Protein: 6.2 g/dL (ref 6.1–8.1)

## 2023-02-05 LAB — AFP TUMOR MARKER: AFP-Tumor Marker: 1.7 ng/mL (ref <6.1)

## 2023-02-10 ENCOUNTER — Other Ambulatory Visit: Payer: Self-pay

## 2023-02-10 DIAGNOSIS — K7460 Unspecified cirrhosis of liver: Secondary | ICD-10-CM

## 2023-02-10 DIAGNOSIS — D649 Anemia, unspecified: Secondary | ICD-10-CM

## 2023-02-13 ENCOUNTER — Other Ambulatory Visit: Payer: Medicare Other

## 2023-02-16 NOTE — Addendum Note (Signed)
Addended by: Zada Finders on: 02/16/2023 12:39 PM   Modules accepted: Orders

## 2023-02-19 NOTE — Progress Notes (Signed)
[facility] [address], [place] [address]   Clinic Day:  02/20/2023  Referring physician: Elfredia Nevins, MD  Patient Care Team: Elfredia Nevins, MD as PCP - General (Internal Medicine) Jonelle Sidle, MD as PCP - Cardiology (Cardiology) Jonelle Sidle, MD as Consulting Physician (Cardiology) Jena Gauss Gerrit Friends, MD as Consulting Physician (Gastroenterology) Doreatha Massed, MD as Medical Oncologist (Hematology)   ASSESSMENT & PLAN:   Assessment:  1.  Thrombocytopenia: - Patient seen at the request of Dr. Sherwood Gambler - 01/29/2023: PLT-88, B12 and folic acid normal - 04/24/2022: Platelets 99 - CTAP (02/03/2023): Splenomegaly 18 cm, liver cirrhosis, portal hypertension  2.  Normocytic anemia: - 01/28/2013: Hb-9.2, MCV-90, WBC-3.2, creatinine-1.06 - He has history of iron deficiency anemia secondary to recurrent GI bleed from small bowel/colonic AVMs.  History of esophageal varices without prior bleeding on beta-blocker. - Colonoscopy (04/14/2022): Nonbleeding internal hemorrhoids, single ulcer in the cecum, 5 nonbleeding colonic angiodysplastic lesions treated with APC. - EGD (01/23/2022): Columns of grade 1-2 varices, portal gastropathy, normal duodenum - He has been taking iron tablet 1-2 times daily for almost a year.  He had to have blood transfusion last year.  No ice pica.  No previous parenteral iron therapy.  3.  Social/family history: - Lives at home with his wife.  Independent of ADLs and IADLs.  Seen today with his daughter.  He is a retired [employer].  Quit smoking more than 20 years ago. - Half brother had prostate cancer.  Half sister had breast cancer.  Plan:  1.  Moderate thrombocytopenia: - Differential diagnosis includes splenic sequestration from splenomegaly and cirrhosis. - Will check for nutritional deficiencies, connective tissue disorders and infectious etiologies. - RTC 2 to 3 weeks for follow-up.  2.  Normocytic anemia: -  Most likely iron deficiency anemia from chronic GI bleed.  B12 and folic acid were normal. - Will check ferritin, iron panel, MMA, copper, LDH and direct antiglobulin test. - He has been taking iron tablet twice daily for a year. - If the iron stores are low, will consider giving parenteral iron therapy.  Will give her 1 dose of Monoferric 1 g.  Orders Placed This Encounter  Procedures   CBC with Differential    Standing Status:   Future    Number of Occurrences:   1    Standing Expiration Date:   02/20/2024   Ferritin    Standing Status:   Future    Number of Occurrences:   1    Standing Expiration Date:   02/20/2024   Iron and TIBC ([facility] ONLY)    Standing Status:   Future    Number of Occurrences:   1    Standing Expiration Date:   02/20/2024   Methylmalonic acid, serum    Standing Status:   Future    Number of Occurrences:   1    Standing Expiration Date:   02/20/2024   Copper, serum    Standing Status:   Future    Number of Occurrences:   1    Standing Expiration Date:   02/20/2024   Lactate dehydrogenase    Standing Status:   Future    Number of Occurrences:   1    Standing Expiration Date:   02/20/2024   Reticulocytes    Standing Status:   Future    Number of Occurrences:   1    Standing Expiration Date:   02/20/2024   ANA, IFA (with reflex)  Standing Status:   Future    Number of Occurrences:   1    Standing Expiration Date:   02/20/2024   Rheumatoid factor    Standing Status:   Future    Number of Occurrences:   1    Standing Expiration Date:   02/20/2024   Protein electrophoresis, serum    Standing Status:   Future    Number of Occurrences:   1    Standing Expiration Date:   02/20/2024   Kappa/lambda light chains    Standing Status:   Future    Number of Occurrences:   1    Standing Expiration Date:   02/20/2024   Immunofixation electrophoresis    Standing Status:   Future    Number of Occurrences:   1    Standing Expiration Date:   02/20/2024    Hepatitis B core antibody, total    Standing Status:   Future    Number of Occurrences:   1    Standing Expiration Date:   02/20/2024   Hepatitis B surface antigen    Standing Status:   Future    Number of Occurrences:   1    Standing Expiration Date:   02/20/2024   Hepatitis B surface antibody    Standing Status:   Future    Number of Occurrences:   1    Standing Expiration Date:   02/20/2024   Hepatitis C Antibody    Standing Status:   Future    Number of Occurrences:   1    Standing Expiration Date:   02/20/2024   Direct antiglobulin test    Standing Status:   Future    Number of Occurrences:   1    Standing Expiration Date:   02/20/2024      I,Katie Daubenspeck,acting as a scribe for Doreatha Massed, MD.,have documented all relevant documentation on the behalf of Doreatha Massed, MD,as directed by  Doreatha Massed, MD while in the presence of Doreatha Massed, MD.   I, Doreatha Massed MD, have reviewed the above documentation for accuracy and completeness, and I agree with the above.   Doreatha Massed, MD   6/7/20243:44 PM  CHIEF COMPLAINT/PURPOSE OF CONSULT:   Diagnosis: thrombocytopenia   Current Therapy: Under workup  HISTORY OF PRESENT ILLNESS:   Graden is a 77 y.o. male presenting to clinic today for evaluation of thrombocytopenia at the request of Dr. Sherwood Gambler.  He has a long-standing history of thrombocytopenia since at least 2010. He was previously referred to Dr. Ubaldo Glassing in 11/2015 for this. He underwent work up at that time, and his thrombocytopenia was felt to be secondary to liver disorder and associated with splenomegaly. Treatment was not felt necessary at that time, and he was released to follow up with his PCP.  More recently, he was noted to have a dropping platelet count by his PCP-- platelets in 04/2022 was 99k, most recently 80k in 01/2023. Per chart review, his platelet count has fluctuated between 75k and 130k since 2015.  Of note, he  was referred to [facility] 04/2022 for anemia due to upper GI blood loss. He was previously on Eliquis and aspirin, now off both. He has been taking oral iron and is currently taking it BID, per recommendation by GI.  Today, he states that he is doing well overall. His appetite level is at 75%. His energy level is at 90%.  PAST MEDICAL HISTORY:   Past Medical History: Past Medical History:  Diagnosis Date  Cirrhosis (HCC)    Immune to Hep A. Received Hep B vaccination in 2022.   Essential hypertension    GERD (gastroesophageal reflux disease)    Hypercholesteremia    PAF (paroxysmal atrial fibrillation) (HCC) 05/04/2014   Type 2 diabetes mellitus [facility])     Surgical History: Past Surgical History:  Procedure Laterality Date   BIOPSY  02/05/2017   Procedure: BIOPSY;  Surgeon: Corbin Ade, MD;  [facility];  Service: Endoscopy;;  gastric colon   CHOLECYSTECTOMY     COLONOSCOPY N/A 05/13/2013   Procedure: COLONOSCOPY;  Surgeon: Corbin Ade, MD;  [facility];  Service: Endoscopy;  Laterality: N/A;  8:30 AM   COLONOSCOPY N/A 02/05/2017   Procedure: COLONOSCOPY;  Surgeon: Corbin Ade, MD;  [facility];  Service: Endoscopy;  Laterality: N/A;   COLONOSCOPY WITH PROPOFOL N/A 03/26/2022   Procedure: COLONOSCOPY WITH PROPOFOL;  Surgeon: Corbin Ade, MD;  [facility];  Service: Endoscopy;  Laterality: N/A;  7:30am, moved to 7/12 @ 2:45   COLONOSCOPY WITH PROPOFOL N/A 04/14/2022   Procedure: COLONOSCOPY WITH PROPOFOL;  Surgeon: Lanelle Bal, DO;  [facility];  Service: Endoscopy;  Laterality: N/A;   ESOPHAGOGASTRODUODENOSCOPY N/A 02/05/2017   Procedure: ESOPHAGOGASTRODUODENOSCOPY (EGD);  Surgeon: Corbin Ade, MD;  [facility];  Service: Endoscopy;  Laterality: N/A;  730 - moved to 12:00    ESOPHAGOGASTRODUODENOSCOPY N/A 07/04/2020   Procedure: ESOPHAGOGASTRODUODENOSCOPY (EGD);  Surgeon: Corbin Ade,  MD;  [facility];  Service: Endoscopy;  Laterality: N/A;  12:45pm   ESOPHAGOGASTRODUODENOSCOPY (EGD) WITH PROPOFOL N/A 01/23/2022   3 columns of grade 1-2 varices, portal gastropathy, normal duodenum. Begin Nadolol 20 mg daily.   GIVENS CAPSULE STUDY N/A 07/04/2020   Procedure: GIVENS CAPSULE STUDY;  Surgeon: Corbin Ade, MD;  [facility];  Service: Endoscopy;  Laterality: N/A;  Elected not to place a capsule today   GIVENS CAPSULE STUDY N/A 04/12/2022   Procedure: GIVENS CAPSULE STUDY;  Surgeon: Dolores Frame, MD;  [facility];  Service: Gastroenterology;  Laterality: N/A;   HOT HEMOSTASIS  03/26/2022   Procedure: HOT HEMOSTASIS (ARGON PLASMA COAGULATION/BICAP);  Surgeon: Corbin Ade, MD;  [facility];  Service: Endoscopy;;   HOT HEMOSTASIS  04/14/2022   Procedure: HOT HEMOSTASIS (ARGON PLASMA COAGULATION/BICAP);  Surgeon: Lanelle Bal, DO;  [facility];  Service: Endoscopy;;   POLYPECTOMY  02/05/2017   Procedure: POLYPECTOMY;  Surgeon: Corbin Ade, MD;  [facility];  Service: Endoscopy;;  colon    Social History: Social History   Socioeconomic History   Marital status: Married    Spouse name: Not on file   Number of children: Not on file   Years of education: Not on file   Highest education level: Not on file  Occupational History   Not on file  Tobacco Use   Smoking status: Former    Packs/day: 2.00    Years: 10.00    Additional pack years: 0.00    Total pack years: 20.00    Types: Cigarettes    Start date: 05/25/1956    Quit date: 05/25/2009    Years since quitting: 13.7   Smokeless tobacco: Current    Types: Chew  Vaping Use   Vaping Use: Never used  Substance and Sexual Activity   Alcohol use: Not Currently   Drug use: No   Sexual  activity: Yes  Other Topics Concern   Not on file  Social History Narrative   Not on file   Social Determinants of Health   Financial  Resource Strain: Not on file  Food Insecurity: Not on file  Transportation Needs: Not on file  Physical Activity: Not on file  Stress: Not on file  Social Connections: Not on file  Intimate Partner Violence: Not on file    Family History: Family History  Problem Relation Age of Onset   Prostate cancer Brother    Congestive Heart Failure Mother    Heart failure Brother    Colon cancer Neg Hx     Current Medications:  Current Outpatient Medications:    cholecalciferol (VITAMIN D3) 25 MCG (1000 UNIT) tablet, Take 1,000 Units by mouth daily., Disp: , Rfl:    CREON 36000-114000 units CPEP capsule, Take by mouth., Disp: , Rfl:    [employer] IRON 325 (65 Fe) MG tablet, Take 325 mg by mouth 2 (two) times daily., Disp: , Rfl:    diltiazem (CARDIZEM CD) 360 MG 24 hr capsule, Take 360 mg by mouth daily. , Disp: , Rfl:    gabapentin (NEURONTIN) 100 MG capsule, Take 100 mg by mouth at bedtime., Disp: , Rfl:    Krill Oil 500 MG CAPS, Take 500 mg by mouth daily., Disp: , Rfl:    losartan (COZAAR) 100 MG tablet, Take 100 mg by mouth daily., Disp: , Rfl:    metFORMIN (GLUCOPHAGE) 500 MG tablet, Take 1,000 mg by mouth 2 (two) times daily with a meal., Disp: , Rfl:    milk thistle 175 MG tablet, Take 175 mg by mouth daily., Disp: , Rfl:    Multiple Vitamin (MULTIVITAMIN WITH MINERALS) TABS tablet, Take 1 tablet by mouth daily., Disp: , Rfl:    nadolol (CORGARD) 20 MG tablet, TAKE 1 TABLET BY MOUTH EVERY DAY, Disp: 90 tablet, Rfl: 3   pantoprazole (PROTONIX) 40 MG tablet, Take 1 tablet (40 mg total) by mouth daily before breakfast., Disp: 90 tablet, Rfl: 3   simvastatin (ZOCOR) 40 MG tablet, Take 40 mg by mouth every evening. , Disp: , Rfl:    Allergies: Allergies  Allergen Reactions   Rabbit Protein Swelling    Face swelled up   Hydrocodone-Acetaminophen Rash   Other Swelling and Rash    Dial soap   Ivp Dye [Iodinated Contrast Media] Other (See Comments)    Possible allergy... patient can not  confirm or deny contrast allergy, needs pre meds pre Dr. Si Gaul (jennifer Excell Seltzer 01/27/2023)    REVIEW OF SYSTEMS:   Review of Systems  Constitutional:  Negative for chills, fatigue and fever.  HENT:   Negative for lump/mass, mouth sores, nosebleeds, sore throat and trouble swallowing.   Eyes:  Negative for eye problems.  Respiratory:  Negative for cough and shortness of breath.   Cardiovascular:  Negative for chest pain, leg swelling and palpitations.  Gastrointestinal:  Positive for diarrhea. Negative for abdominal pain, constipation, nausea and vomiting.  Genitourinary:  Positive for frequency. Negative for bladder incontinence, difficulty urinating, dysuria, hematuria and nocturia.   Musculoskeletal:  Negative for arthralgias, back pain, flank pain, myalgias and neck pain.  Skin:  Negative for itching and rash.  Neurological:  Negative for dizziness, headaches and numbness.  Hematological:  Does not bruise/bleed easily.  Psychiatric/Behavioral:  Negative for depression, sleep disturbance and suicidal ideas. The patient is not nervous/anxious.   All other systems reviewed and are negative.    VITALS:  Blood pressure 123/65, pulse 68, temperature 97.7 F (36.5 C), temperature source Oral, resp. rate 17, weight 174 lb (78.9 kg), SpO2 99 %.  Wt Readings from Last 3 Encounters:  02/20/23 174 lb (78.9 kg)  02/04/23 179 lb 3.2 oz (81.3 kg)  12/17/22 177 lb (80.3 kg)    Body mass index is 24.27 kg/m.   PHYSICAL EXAM:   Physical Exam Vitals and nursing note reviewed. Exam conducted with a chaperone present.  Constitutional:      Appearance: Normal appearance.  Cardiovascular:     Rate and Rhythm: Normal rate and regular rhythm.     Pulses: Normal pulses.     Heart sounds: Normal heart sounds.  Pulmonary:     Effort: Pulmonary effort is normal.     Breath sounds: Normal breath sounds.  Abdominal:     Palpations: Abdomen is soft. There is no hepatomegaly, splenomegaly or mass.      Tenderness: There is no abdominal tenderness.  Musculoskeletal:     Right lower leg: No edema.     Left lower leg: No edema.  Lymphadenopathy:     Cervical: No cervical adenopathy.     Right cervical: No superficial, deep or posterior cervical adenopathy.    Left cervical: No superficial, deep or posterior cervical adenopathy.     Upper Body:     Right upper body: No supraclavicular or axillary adenopathy.     Left upper body: No supraclavicular or axillary adenopathy.  Neurological:     General: No focal deficit present.     Mental Status: He is alert and oriented to person, place, and time.  Psychiatric:        Mood and Affect: Mood normal.        Behavior: Behavior normal.     LABS:      Latest Ref Rng & Units 02/20/2023    8:41 AM 02/04/2023   10:53 AM 09/22/2022    9:54 AM  CBC  WBC 4.0 - 10.5 K/uL 3.3  5.5  3.0   Hemoglobin 13.0 - 17.0 g/dL 9.5  8.9  9.4   Hematocrit 39.0 - 52.0 % 28.5  26.8  28.5   Platelets 150 - 400 K/uL 80  98  78       Latest Ref Rng & Units 02/04/2023   10:53 AM 02/03/2023    9:27 AM 04/14/2022    5:37 AM  CMP  Glucose 65 - 139 mg/dL 409   811   BUN 7 - 25 mg/dL 18   10   Creatinine 9.14 - 1.28 mg/dL 7.82  9.56  2.13   Sodium 135 - 146 mmol/L 137   138   Potassium 3.5 - 5.3 mmol/L 4.6   4.1   Chloride 98 - 110 mmol/L 101   109   CO2 20 - 32 mmol/L 25   25   Calcium 8.6 - 10.3 mg/dL 08.6   8.6   Total Protein 6.1 - 8.1 g/dL 6.2     Total Bilirubin 0.2 - 1.2 mg/dL 0.4     AST 10 - 35 U/L 27     ALT 9 - 46 U/L 22        No results found for: "CEA1", "CEA" / No results found for: "CEA1", "CEA" No results found for: "PSA1" No results found for: "VHQ469" No results found for: "CAN125"  No results found for: "TOTALPROTELP", "ALBUMINELP", "A1GS", "A2GS", "BETS", "BETA2SER", "GAMS", "MSPIKE", "SPEI" Lab Results  Component Value Date   TIBC  495 (H) 02/20/2023   TIBC 499 (H) 04/11/2022   TIBC 453 (H) 02/25/2022   FERRITIN 16 (L) 02/20/2023    FERRITIN 12 (L) 04/11/2022   FERRITIN 10 (L) 02/25/2022   IRONPCTSAT 21 02/20/2023   IRONPCTSAT 48 (H) 04/11/2022   IRONPCTSAT 6 (L) 02/25/2022   Lab Results  Component Value Date   LDH 128 02/20/2023     STUDIES:   CT ABDOMEN PELVIS W CONTRAST  Result Date: 02/03/2023 CLINICAL DATA:  Nausea. Patient says foot does not taste right. Denies abdominal pain. EXAM: CT ABDOMEN AND PELVIS WITH CONTRAST TECHNIQUE: Multidetector CT imaging of the abdomen and pelvis was performed using the standard protocol following bolus administration of intravenous contrast. RADIATION DOSE REDUCTION: This exam was performed according to the departmental dose-optimization program which includes automated exposure control, adjustment of the mA and/or kV according to patient size and/or use of iterative reconstruction technique. CONTRAST:  OMNIPAQUE IOHEXOL 300 MG/ML  SOLN COMPARISON:  Abdominal ultrasound dated 08/26/2022. FINDINGS: Lower chest: The visualized lung bases are clear. There is advanced coronary vascular calcification. No intra-abdominal free air.  Trace ascites. Hepatobiliary: Cirrhosis. There is mild biliary dilatation, post cholecystectomy. No retained calcified stone noted in the central CBD. Pancreas: Mild peripancreatic stranding, likely related to ascites and cirrhosis. Correlation with pancreatic enzymes recommended to exclude pancreatitis. Spleen: Splenomegaly measuring 18 cm in length. Adrenals/Urinary Tract: The adrenal glands unremarkable. There is a 7 mm nonobstructing right renal upper pole calculus. No hydronephrosis. The left kidney is unremarkable. There is symmetric enhancement and excretion of contrast by both kidneys. The visualized ureters and urinary bladder appear unremarkable. Stomach/Bowel: There is sigmoid diverticulosis without active inflammatory changes. Mild thickened appearance of the ascending colon, likely related to underdistention. Mild colitis is less likely. Clinical  correlation is recommended. There is no bowel obstruction. The appendix is normal. Vascular/Lymphatic: Moderate aortoiliac atherosclerotic disease. The IVC is unremarkable. The SMV, splenic vein, and main portal vein are patent. No portal venous gas. Recanalization of the umbilical vein indicative of portal hypertension. Mildly enlarged retroperitoneal lymph nodes, nonspecific, likely reactive. Clinical correlation is recommended. Left external iliac lymph node measures 15 mm in short axis. Reproductive: The prostate and seminal vesicles are grossly unremarkable. No pelvic mass. Other: None Musculoskeletal: Degenerative changes of the spine. No acute osseous pathology. IMPRESSION: 1. Cirrhosis with evidence of portal hypertension, splenomegaly, and trace ascites. 2. Mild peripancreatic stranding, likely related to ascites and cirrhosis. Correlation with pancreatic enzymes recommended to exclude pancreatitis. 3. Sigmoid diverticulosis. No bowel obstruction. Normal appendix. 4. A 7 mm nonobstructing right renal upper pole calculus. No hydronephrosis. 5.  Aortic Atherosclerosis (ICD10-I70.0). Electronically Signed   By: Elgie Collard M.D.   On: 02/03/2023 19:57

## 2023-02-20 ENCOUNTER — Inpatient Hospital Stay: Payer: Medicare Other

## 2023-02-20 ENCOUNTER — Inpatient Hospital Stay: Payer: Medicare Other | Attending: Hematology | Admitting: Hematology

## 2023-02-20 VITALS — BP 123/65 | HR 68 | Temp 97.7°F | Resp 17 | Wt 174.0 lb

## 2023-02-20 DIAGNOSIS — K922 Gastrointestinal hemorrhage, unspecified: Secondary | ICD-10-CM | POA: Diagnosis not present

## 2023-02-20 DIAGNOSIS — D500 Iron deficiency anemia secondary to blood loss (chronic): Secondary | ICD-10-CM | POA: Diagnosis not present

## 2023-02-20 DIAGNOSIS — D696 Thrombocytopenia, unspecified: Secondary | ICD-10-CM | POA: Diagnosis present

## 2023-02-20 DIAGNOSIS — Z8042 Family history of malignant neoplasm of prostate: Secondary | ICD-10-CM | POA: Diagnosis not present

## 2023-02-20 DIAGNOSIS — Z87891 Personal history of nicotine dependence: Secondary | ICD-10-CM | POA: Diagnosis not present

## 2023-02-20 LAB — CBC WITH DIFFERENTIAL/PLATELET
Abs Immature Granulocytes: 0.02 10*3/uL (ref 0.00–0.07)
Basophils Absolute: 0 10*3/uL (ref 0.0–0.1)
Basophils Relative: 0 %
Eosinophils Absolute: 0.1 10*3/uL (ref 0.0–0.5)
Eosinophils Relative: 3 %
HCT: 28.5 % — ABNORMAL LOW (ref 39.0–52.0)
Hemoglobin: 9.5 g/dL — ABNORMAL LOW (ref 13.0–17.0)
Immature Granulocytes: 1 %
Lymphocytes Relative: 17 %
Lymphs Abs: 0.6 10*3/uL — ABNORMAL LOW (ref 0.7–4.0)
MCH: 31.6 pg (ref 26.0–34.0)
MCHC: 33.3 g/dL (ref 30.0–36.0)
MCV: 94.7 fL (ref 80.0–100.0)
Monocytes Absolute: 0.6 10*3/uL (ref 0.1–1.0)
Monocytes Relative: 18 %
Neutro Abs: 2 10*3/uL (ref 1.7–7.7)
Neutrophils Relative %: 61 %
Platelets: 80 10*3/uL — ABNORMAL LOW (ref 150–400)
RBC: 3.01 MIL/uL — ABNORMAL LOW (ref 4.22–5.81)
RDW: 15.2 % (ref 11.5–15.5)
WBC: 3.3 10*3/uL — ABNORMAL LOW (ref 4.0–10.5)
nRBC: 0 % (ref 0.0–0.2)

## 2023-02-20 LAB — IRON AND TIBC
Iron: 105 ug/dL (ref 45–182)
Saturation Ratios: 21 % (ref 17.9–39.5)
TIBC: 495 ug/dL — ABNORMAL HIGH (ref 250–450)
UIBC: 390 ug/dL

## 2023-02-20 LAB — FERRITIN: Ferritin: 16 ng/mL — ABNORMAL LOW (ref 24–336)

## 2023-02-20 LAB — DIRECT ANTIGLOBULIN TEST (NOT AT ARMC)
DAT, IgG: NEGATIVE
DAT, complement: NEGATIVE

## 2023-02-20 LAB — HEPATITIS C ANTIBODY: HCV Ab: NONREACTIVE

## 2023-02-20 LAB — LACTATE DEHYDROGENASE: LDH: 128 U/L (ref 98–192)

## 2023-02-20 LAB — HEPATITIS B SURFACE ANTIGEN: Hepatitis B Surface Ag: NONREACTIVE

## 2023-02-20 LAB — RETICULOCYTES
Immature Retic Fract: 17 % — ABNORMAL HIGH (ref 2.3–15.9)
RBC.: 3.05 MIL/uL — ABNORMAL LOW (ref 4.22–5.81)
Retic Count, Absolute: 72.6 10*3/uL (ref 19.0–186.0)
Retic Ct Pct: 2.4 % (ref 0.4–3.1)

## 2023-02-20 LAB — HEPATITIS B CORE ANTIBODY, TOTAL: Hep B Core Total Ab: NONREACTIVE

## 2023-02-20 LAB — HEPATITIS B SURFACE ANTIBODY,QUALITATIVE: Hep B S Ab: NONREACTIVE

## 2023-02-20 NOTE — Patient Instructions (Signed)
[facility]  Discharge Instructions  You were seen and examined today by Dr. Ellin Saba. Dr. Ellin Saba is a hematologist, meaning that he specializes in blood abnormalities. Dr. Ellin Saba discussed your past medical history, family history of cancers/blood conditions and the events that led to you being here today.  You were referred to Dr. Ellin Saba due to low blood levels. There is no need for immediate intervention at this time.  Dr. Ellin Saba has recommended additional labs today for further evaluation.  Follow-up as scheduled.  Thank you for choosing [facility] to provide your oncology and hematology care.   To afford each patient quality time with our provider, please arrive at least 15 minutes before your scheduled appointment time. You may need to reschedule your appointment if you arrive late (10 or more minutes). Arriving late affects you and other patients whose appointments are after yours.  Also, if you miss three or more appointments without notifying the office, you may be dismissed from the clinic at the provider's discretion.    Again, thank you for choosing [facility].  Our hope is that these requests will decrease the amount of time that you wait before being seen by our physicians.   If you have a lab appointment with the Cancer Center - please note that after April 8th, all labs will be drawn in the cancer center.  You do not have to check in or register with the main entrance as you have in the past but will complete your check-in at the cancer center.            _____________________________________________________________  Should you have questions after your visit to [facility], please contact our office at [phone] and follow the prompts.  Our office hours are 8:00 a.m. to 4:30 p.m. Monday - Thursday and 8:00 a.m. to 2:30 p.m. Friday.  Please note that voicemails left after  4:00 p.m. may not be returned until the following business day.  We are closed weekends and all major holidays.  You do have access to a nurse 24-7, just call the main number to the clinic [phone] and do not press any options, hold on the line and a nurse will answer the phone.    For prescription refill requests, have your pharmacy contact our office and allow 72 hours.    Masks are no longer required in the cancer centers. If you would like for your care team to wear a mask while they are taking care of you, please let them know. You may have one support person who is at least 77 years old accompany you for your appointments.

## 2023-02-22 LAB — RHEUMATOID FACTOR: Rheumatoid fact SerPl-aCnc: <10 IU/mL (ref <14.0)

## 2023-02-23 ENCOUNTER — Other Ambulatory Visit: Payer: Self-pay

## 2023-02-23 DIAGNOSIS — K7460 Unspecified cirrhosis of liver: Secondary | ICD-10-CM

## 2023-02-23 DIAGNOSIS — D649 Anemia, unspecified: Secondary | ICD-10-CM

## 2023-02-23 LAB — KAPPA/LAMBDA LIGHT CHAINS
Kappa free light chain: 26.6 mg/L — ABNORMAL HIGH (ref 3.3–19.4)
Kappa, lambda light chain ratio: 1.29 (ref 0.26–1.65)
Lambda free light chains: 20.6 mg/L (ref 5.7–26.3)

## 2023-02-23 LAB — METHYLMALONIC ACID, SERUM: Methylmalonic Acid, Quantitative: 213 nmol/L (ref 0–378)

## 2023-02-24 LAB — ANTINUCLEAR ANTIBODIES, IFA: ANA Ab, IFA: NEGATIVE

## 2023-02-25 LAB — PROTEIN ELECTROPHORESIS, SERUM
A/G Ratio: 1.4 (ref 0.7–1.7)
Albumin ELP: 3.6 g/dL (ref 2.9–4.4)
Alpha-1-Globulin: 0.2 g/dL (ref 0.0–0.4)
Alpha-2-Globulin: 0.8 g/dL (ref 0.4–1.0)
Beta Globulin: 1 g/dL (ref 0.7–1.3)
Gamma Globulin: 0.5 g/dL (ref 0.4–1.8)
Globulin, Total: 2.5 g/dL (ref 2.2–3.9)
Total Protein ELP: 6.1 g/dL (ref 6.0–8.5)

## 2023-02-25 LAB — COPPER, SERUM: Copper: 157 ug/dL — ABNORMAL HIGH (ref 69–132)

## 2023-02-27 LAB — IMMUNOFIXATION ELECTROPHORESIS
IgA: 86 mg/dL (ref 61–437)
IgG (Immunoglobin G), Serum: 547 mg/dL — ABNORMAL LOW (ref 603–1613)
IgM (Immunoglobulin M), Srm: 19 mg/dL (ref 15–143)
Total Protein ELP: 6.2 g/dL (ref 6.0–8.5)

## 2023-03-03 LAB — CALCIUM: Calcium: 9.4 mg/dL (ref 8.6–10.2)

## 2023-03-13 ENCOUNTER — Inpatient Hospital Stay (HOSPITAL_BASED_OUTPATIENT_CLINIC_OR_DEPARTMENT_OTHER): Payer: Medicare Other | Admitting: Oncology

## 2023-03-13 VITALS — BP 140/63 | HR 73 | Temp 97.9°F | Resp 16 | Wt 176.4 lb

## 2023-03-13 DIAGNOSIS — D500 Iron deficiency anemia secondary to blood loss (chronic): Secondary | ICD-10-CM | POA: Diagnosis not present

## 2023-03-13 DIAGNOSIS — D696 Thrombocytopenia, unspecified: Secondary | ICD-10-CM | POA: Diagnosis not present

## 2023-03-13 NOTE — Progress Notes (Signed)
[facility] [address], [place] [address]   Clinic Day:  03/13/2023  Referring physician: Elfredia Nevins, MD  Patient Care Team: Elfredia Nevins, MD as PCP - General (Internal Medicine) Jonelle Sidle, MD as PCP - Cardiology (Cardiology) Jonelle Sidle, MD as Consulting Physician (Cardiology) Jena Gauss Gerrit Friends, MD as Consulting Physician (Gastroenterology) Doreatha Massed, MD as Medical Oncologist (Hematology)   ASSESSMENT & PLAN:   Assessment:  1.  Thrombocytopenia: - Patient seen at the request of Dr. Sherwood Gambler - 01/29/2023: PLT-88, B12 and folic acid normal - 04/24/2022: Platelets 99 - CTAP (02/03/2023): Splenomegaly 18 cm, liver cirrhosis, portal hypertension  2.  Normocytic anemia: - 01/28/2013: Hb-9.2, MCV-90, WBC-3.2, creatinine-1.06 - He has history of iron deficiency anemia secondary to recurrent GI bleed from small bowel/colonic AVMs.  History of esophageal varices without prior bleeding on beta-blocker. - Colonoscopy (04/14/2022): Nonbleeding internal hemorrhoids, single ulcer in the cecum, 5 nonbleeding colonic angiodysplastic lesions treated with APC. - EGD (01/23/2022): Columns of grade 1-2 varices, portal gastropathy, normal duodenum - He has been taking iron tablet 1-2 times daily for almost a year.  He had to have blood transfusion last year.  No ice pica.  No previous parenteral iron therapy.  3.  Social/family history: - Lives at home with his wife.  Independent of ADLs and IADLs.  Seen today with his daughter.  He is a retired [employer].  Quit smoking more than 20 years ago. - Half brother had prostate cancer.  Half sister had breast cancer.  Plan:  1.  Moderate thrombocytopenia: - Reviewed labs from 02/20/2023 which show a platelet count of 80,000 (98,000), hemoglobin 9.5 and white blood cell count of 3.3.  Copper slightly elevated at 157.  B12 and MMA are within normal limits.  No evidence of infectious etiology with either  normal or nonreactive hep B and hep C testing.  Ferritin low at 16.  Iron saturation 21%.   ANA, RA and LDH are WNL or nonreactive. -Thrombocytopenia likely secondary to splenomegaly and liver disease.  Continue to monitor labs every 6 to 8 weeks.  2.  Normocytic anemia: -Labs from 02/20/2023 show hemoglobin of 9.5, ferritin 16 and iron saturation 21%.  MMA is normal, copper slightly elevated.  LDH is normal.  -No evidence of underlying blood dyscrasia (protein electrophoresis did not reveal evidence of monoclonal protein).  No evidence of hemolytic anemia (normal direct antiglobulin test).  - Most likely iron deficiency anemia from chronic GI bleed.  B12 and folic acid were normal. -Recommend giving parenteral IV iron with 1 dose of Monoferric 1 g. -May continue oral iron twice daily given he is tolerating well.  PLAN SUMMARY: >> Recommend 1 g Monoferric IV iron. >> Recommend follow-up with labs (CBC, Iron and ferritin) in 6 to 8 weeks.      No orders of the defined types were placed in this encounter.   I spent 20 minutes dedicated to the care of this patient (face-to-face and non-face-to-face) on the date of the encounter to include what is described in the assessment and plan.   Mauro Kaufmann, NP   6/28/202410:52 AM  CHIEF COMPLAINT/PURPOSE OF CONSULT:   Diagnosis: thrombocytopenia   Current Therapy: Under workup  HISTORY OF PRESENT ILLNESS:   Derek Drake is a 77 y.o. male presenting to clinic today to review recent lab work.  He was sent over at the request of Dr. Sherwood Gambler.  He was last seen in clinic by Dr. Ellin Saba on  02/20/2023.  Previous workup by Dr. Ubaldo Glassing in March 2017 felt thrombocytopenia was secondary to liver disorder and associated splenomegaly.  No treatment was recommended at that time and she was released to follow-up with PCP.  Over the past few years, his platelet count continues to decline with fluctuation between 75,000-130,000 since 2015.   He has known  anemia from upper GI blood loss and is followed by [facility].  He was taken off both Eliquis and aspirin in 2023 due to this.  In the interim, he has been well.  Appetite and energy levels are both 75%.  Denies any pain.  Has history of loose stools first thing in the morning but this has improved since being on oral iron.  He denies any GI bleeding.  States he feels the best he is felt in many years.  Occasionally will feel tired but he is able to do more yard work and spend more time trying to settle than usual.  PAST MEDICAL HISTORY:   Past Medical History: Past Medical History:  Diagnosis Date   Cirrhosis (HCC)    Immune to Hep A. Received Hep B vaccination in 2022.   Essential hypertension    GERD (gastroesophageal reflux disease)    Hypercholesteremia    PAF (paroxysmal atrial fibrillation) (HCC) 05/04/2014   Type 2 diabetes mellitus [facility])     Surgical History: Past Surgical History:  Procedure Laterality Date   BIOPSY  02/05/2017   Procedure: BIOPSY;  Surgeon: Corbin Ade, MD;  [facility];  Service: Endoscopy;;  gastric colon   CHOLECYSTECTOMY     COLONOSCOPY N/A 05/13/2013   Procedure: COLONOSCOPY;  Surgeon: Corbin Ade, MD;  [facility];  Service: Endoscopy;  Laterality: N/A;  8:30 AM   COLONOSCOPY N/A 02/05/2017   Procedure: COLONOSCOPY;  Surgeon: Corbin Ade, MD;  [facility];  Service: Endoscopy;  Laterality: N/A;   COLONOSCOPY WITH PROPOFOL N/A 03/26/2022   Procedure: COLONOSCOPY WITH PROPOFOL;  Surgeon: Corbin Ade, MD;  [facility];  Service: Endoscopy;  Laterality: N/A;  7:30am, moved to 7/12 @ 2:45   COLONOSCOPY WITH PROPOFOL N/A 04/14/2022   Procedure: COLONOSCOPY WITH PROPOFOL;  Surgeon: Lanelle Bal, DO;  [facility];  Service: Endoscopy;  Laterality: N/A;   ESOPHAGOGASTRODUODENOSCOPY N/A 02/05/2017   Procedure: ESOPHAGOGASTRODUODENOSCOPY (EGD);  Surgeon: Corbin Ade, MD;  [facility];  Service: Endoscopy;  Laterality: N/A;  730 - moved to 12:00    ESOPHAGOGASTRODUODENOSCOPY N/A 07/04/2020   Procedure: ESOPHAGOGASTRODUODENOSCOPY (EGD);  Surgeon: Corbin Ade, MD;  [facility];  Service: Endoscopy;  Laterality: N/A;  12:45pm   ESOPHAGOGASTRODUODENOSCOPY (EGD) WITH PROPOFOL N/A 01/23/2022   3 columns of grade 1-2 varices, portal gastropathy, normal duodenum. Begin Nadolol 20 mg daily.   GIVENS CAPSULE STUDY N/A 07/04/2020   Procedure: GIVENS CAPSULE STUDY;  Surgeon: Corbin Ade, MD;  [facility];  Service: Endoscopy;  Laterality: N/A;  Elected not to place a capsule today   GIVENS CAPSULE STUDY N/A 04/12/2022   Procedure: GIVENS CAPSULE STUDY;  Surgeon: Dolores Frame, MD;  [facility];  Service: Gastroenterology;  Laterality: N/A;   HOT HEMOSTASIS  03/26/2022   Procedure: HOT HEMOSTASIS (ARGON PLASMA COAGULATION/BICAP);  Surgeon: Corbin Ade, MD;  [facility];  Service: Endoscopy;;   HOT HEMOSTASIS  04/14/2022   Procedure: HOT HEMOSTASIS (ARGON PLASMA COAGULATION/BICAP);  Surgeon: Lanelle Bal, DO;  [facility];  Service: Endoscopy;;   POLYPECTOMY  02/05/2017   Procedure: POLYPECTOMY;  Surgeon: Corbin Ade, MD;  [facility];  Service: Endoscopy;;  colon    Social History: Social History   Socioeconomic History   Marital status: Married    Spouse name: Not on file   Number of children: Not on file   Years of education: Not on file   Highest education level: Not on file  Occupational History   Not on file  Tobacco Use   Smoking status: Former    Packs/day: 2.00    Years: 10.00    Additional pack years: 0.00    Total pack years: 20.00    Types: Cigarettes    Start date: 05/25/1956    Quit date: 05/25/2009    Years since quitting: 13.8   Smokeless tobacco: Current    Types: Chew  Vaping Use   Vaping Use: Never used  Substance and Sexual Activity    Alcohol use: Not Currently   Drug use: No   Sexual activity: Yes  Other Topics Concern   Not on file  Social History Narrative   Not on file   Social Determinants of Health   Financial Resource Strain: Not on file  Food Insecurity: Not on file  Transportation Needs: Not on file  Physical Activity: Not on file  Stress: Not on file  Social Connections: Not on file  Intimate Partner Violence: Not on file    Family History: Family History  Problem Relation Age of Onset   Prostate cancer Brother    Congestive Heart Failure Mother    Heart failure Brother    Colon cancer Neg Hx     Current Medications:  Current Outpatient Medications:    cholecalciferol (VITAMIN D3) 25 MCG (1000 UNIT) tablet, Take 1,000 Units by mouth daily., Disp: , Rfl:    CREON 36000-114000 units CPEP capsule, Take by mouth., Disp: , Rfl:    [employer] IRON 325 (65 Fe) MG tablet, Take 325 mg by mouth 2 (two) times daily., Disp: , Rfl:    diltiazem (CARDIZEM CD) 360 MG 24 hr capsule, Take 360 mg by mouth daily. , Disp: , Rfl:    gabapentin (NEURONTIN) 100 MG capsule, Take 100 mg by mouth at bedtime., Disp: , Rfl:    Krill Oil 500 MG CAPS, Take 500 mg by mouth daily., Disp: , Rfl:    losartan (COZAAR) 100 MG tablet, Take 100 mg by mouth daily., Disp: , Rfl:    metFORMIN (GLUCOPHAGE) 500 MG tablet, Take 1,000 mg by mouth 2 (two) times daily with a meal., Disp: , Rfl:    milk thistle 175 MG tablet, Take 175 mg by mouth daily., Disp: , Rfl:    Multiple Vitamin (MULTIVITAMIN WITH MINERALS) TABS tablet, Take 1 tablet by mouth daily., Disp: , Rfl:    nadolol (CORGARD) 20 MG tablet, TAKE 1 TABLET BY MOUTH EVERY DAY, Disp: 90 tablet, Rfl: 3   pantoprazole (PROTONIX) 40 MG tablet, Take 1 tablet (40 mg total) by mouth daily before breakfast., Disp: 90 tablet, Rfl: 3   simvastatin (ZOCOR) 40 MG tablet, Take 40 mg by mouth every evening. , Disp: , Rfl:    Allergies: Allergies  Allergen Reactions   Rabbit Protein Swelling     Face swelled up   Hydrocodone-Acetaminophen Rash   Other Swelling and Rash    Dial soap   Ivp Dye [Iodinated Contrast Media] Other (See Comments)    Possible allergy.Marland KitchenMarland Kitchen  patient can not confirm or deny contrast allergy, needs pre meds pre Dr. Si Gaul (Schwanda Zima Excell Seltzer 01/27/2023)    REVIEW OF SYSTEMS:   Review of Systems  Constitutional:  Positive for fatigue (Occasional). Negative for appetite change.  HENT:   Negative for nosebleeds.   Respiratory:  Negative for shortness of breath.   Gastrointestinal:  Positive for diarrhea (Mainly in the morning). Negative for blood in stool.     VITALS:   Blood pressure (!) 140/63, pulse 73, temperature 97.9 F (36.6 C), temperature source Oral, resp. rate 16, weight 176 lb 5.9 oz (80 kg), SpO2 98 %.  Wt Readings from Last 3 Encounters:  03/13/23 176 lb 5.9 oz (80 kg)  02/20/23 174 lb (78.9 kg)  02/04/23 179 lb 3.2 oz (81.3 kg)    Body mass index is 24.6 kg/m.   PHYSICAL EXAM:   Physical Exam Constitutional:      Appearance: Normal appearance.  HENT:     Head: Normocephalic and atraumatic.  Eyes:     Pupils: Pupils are equal, round, and reactive to light.  Cardiovascular:     Rate and Rhythm: Normal rate and regular rhythm.     Heart sounds: Normal heart sounds. No murmur heard. Pulmonary:     Effort: Pulmonary effort is normal.     Breath sounds: Normal breath sounds. No wheezing.  Abdominal:     General: Bowel sounds are normal. There is no distension.     Palpations: Abdomen is soft.     Tenderness: There is no abdominal tenderness.  Musculoskeletal:        General: Normal range of motion.     Cervical back: Normal range of motion.  Skin:    General: Skin is warm and dry.     Findings: No rash.  Neurological:     Mental Status: He is alert and oriented to person, place, and time.  Psychiatric:        Judgment: Judgment normal.     LABS:      Latest Ref Rng & Units 02/20/2023    8:41 AM 02/04/2023   10:53 AM 09/22/2022     9:54 AM  CBC  WBC 4.0 - 10.5 K/uL 3.3  5.5  3.0   Hemoglobin 13.0 - 17.0 g/dL 9.5  8.9  9.4   Hematocrit 39.0 - 52.0 % 28.5  26.8  28.5   Platelets 150 - 400 K/uL 80  98  78       Latest Ref Rng & Units 03/02/2023    9:38 AM 02/04/2023   10:53 AM 02/03/2023    9:27 AM  CMP  Glucose 65 - 139 mg/dL  595    BUN 7 - 25 mg/dL  18    Creatinine 6.38 - 1.28 mg/dL  7.56  4.33   Sodium 295 - 146 mmol/L  137    Potassium 3.5 - 5.3 mmol/L  4.6    Chloride 98 - 110 mmol/L  101    CO2 20 - 32 mmol/L  25    Calcium 8.6 - 10.2 mg/dL 9.4  18.8    Total Protein 6.1 - 8.1 g/dL  6.2    Total Bilirubin 0.2 - 1.2 mg/dL  0.4    AST 10 - 35 U/L  27    ALT 9 - 46 U/L  22       No results found for: "CEA1", "CEA" / No results found for: "CEA1", "CEA" No results found for: "PSA1" No results found for: "CZY606"  No results found for: "CAN125"  Lab Results  Component Value Date   TOTALPROTELP 6.2 02/20/2023   TOTALPROTELP 6.1 02/20/2023   ALBUMINELP 3.6 02/20/2023   A1GS 0.2 02/20/2023   A2GS 0.8 02/20/2023   BETS 1.0 02/20/2023   GAMS 0.5 02/20/2023   MSPIKE Not Observed 02/20/2023   SPEI Comment 02/20/2023   Lab Results  Component Value Date   TIBC 495 (H) 02/20/2023   TIBC 499 (H) 04/11/2022   TIBC 453 (H) 02/25/2022   FERRITIN 16 (L) 02/20/2023   FERRITIN 12 (L) 04/11/2022   FERRITIN 10 (L) 02/25/2022   IRONPCTSAT 21 02/20/2023   IRONPCTSAT 48 (H) 04/11/2022   IRONPCTSAT 6 (L) 02/25/2022   Lab Results  Component Value Date   LDH 128 02/20/2023     STUDIES:   No results found.

## 2023-03-27 ENCOUNTER — Inpatient Hospital Stay: Payer: Medicare Other | Attending: Hematology

## 2023-03-27 VITALS — BP 119/53 | HR 61 | Temp 97.5°F | Resp 18

## 2023-03-27 DIAGNOSIS — D696 Thrombocytopenia, unspecified: Secondary | ICD-10-CM | POA: Diagnosis not present

## 2023-03-27 DIAGNOSIS — D500 Iron deficiency anemia secondary to blood loss (chronic): Secondary | ICD-10-CM | POA: Diagnosis present

## 2023-03-27 DIAGNOSIS — Z8042 Family history of malignant neoplasm of prostate: Secondary | ICD-10-CM | POA: Insufficient documentation

## 2023-03-27 DIAGNOSIS — K922 Gastrointestinal hemorrhage, unspecified: Secondary | ICD-10-CM | POA: Insufficient documentation

## 2023-03-27 DIAGNOSIS — Z87891 Personal history of nicotine dependence: Secondary | ICD-10-CM | POA: Diagnosis not present

## 2023-03-27 MED ORDER — ACETAMINOPHEN 325 MG PO TABS
650.0000 mg | ORAL_TABLET | Freq: Once | ORAL | Status: AC
  Administered 2023-03-27: 650 mg via ORAL
  Filled 2023-03-27: qty 2

## 2023-03-27 MED ORDER — CETIRIZINE HCL 10 MG PO TABS
10.0000 mg | ORAL_TABLET | Freq: Once | ORAL | Status: AC
  Administered 2023-03-27: 10 mg via ORAL
  Filled 2023-03-27: qty 1

## 2023-03-27 MED ORDER — SODIUM CHLORIDE 0.9 % IV SOLN
1000.0000 mg | Freq: Once | INTRAVENOUS | Status: AC
  Administered 2023-03-27: 1000 mg via INTRAVENOUS
  Filled 2023-03-27: qty 10

## 2023-03-27 MED ORDER — SODIUM CHLORIDE 0.9 % IV SOLN: Freq: Once | INTRAVENOUS | Status: AC

## 2023-03-27 MED ORDER — FAMOTIDINE IN NACL 20-0.9 MG/50ML-% IV SOLN
20.0000 mg | Freq: Once | INTRAVENOUS | Status: AC
  Administered 2023-03-27: 20 mg via INTRAVENOUS
  Filled 2023-03-27: qty 50

## 2023-03-27 NOTE — Patient Instructions (Signed)
[facility]  Discharge Instructions: Thank you for choosing [facility] to provide your oncology and hematology care.  If you have a lab appointment with the Cancer Center - please note that after April 8th, 2024, all labs will be drawn in the cancer center.  You do not have to check in or register with the main entrance as you have in the past but will complete your check-in in the cancer center.  Wear comfortable clothing and clothing appropriate for easy access to any Portacath or PICC line.   We strive to give you quality time with your provider. You may need to reschedule your appointment if you arrive late (15 or more minutes).  Arriving late affects you and other patients whose appointments are after yours.  Also, if you miss three or more appointments without notifying the office, you may be dismissed from the clinic at the provider's discretion.      For prescription refill requests, have your pharmacy contact our office and allow 72 hours for refills to be completed.    Today you received the following Monoferric, return as scheduled.   To help prevent nausea and vomiting after your treatment, we encourage you to take your nausea medication as directed.  BELOW ARE SYMPTOMS THAT SHOULD BE REPORTED IMMEDIATELY: *FEVER GREATER THAN 100.4 F (38 C) OR HIGHER *CHILLS OR SWEATING *NAUSEA AND VOMITING THAT IS NOT CONTROLLED WITH YOUR NAUSEA MEDICATION *UNUSUAL SHORTNESS OF BREATH *UNUSUAL BRUISING OR BLEEDING *URINARY PROBLEMS (pain or burning when urinating, or frequent urination) *BOWEL PROBLEMS (unusual diarrhea, constipation, pain near the anus) TENDERNESS IN MOUTH AND THROAT WITH OR WITHOUT PRESENCE OF ULCERS (sore throat, sores in mouth, or a toothache) UNUSUAL RASH, SWELLING OR PAIN  UNUSUAL VAGINAL DISCHARGE OR ITCHING   Items with * indicate a potential emergency and should be followed up as soon as possible or go to the Emergency Department  if any problems should occur.  Please show the CHEMOTHERAPY ALERT CARD or IMMUNOTHERAPY ALERT CARD at check-in to the Emergency Department and triage nurse.  Should you have questions after your visit or need to cancel or reschedule your appointment, please contact [facility] [phone]  and follow the prompts.  Office hours are 8:00 a.m. to 4:30 p.m. Monday - Friday. Please note that voicemails left after 4:00 p.m. may not be returned until the following business day.  We are closed weekends and major holidays. You have access to a nurse at all times for urgent questions. Please call the main number to the clinic [phone] and follow the prompts.  For any non-urgent questions, you may also contact your provider using MyChart. We now offer e-Visits for anyone 18 and older to request care online for non-urgent symptoms. For details visit [email or web address].   Also download the MyChart app! Go to the app store, search "MyChart", open the app, select [place], and log in with your MyChart username and password.   

## 2023-03-27 NOTE — Progress Notes (Signed)
Patient tolerated iron infusion with no complaints voiced.  Peripheral IV site clean and dry with good blood return noted before and after infusion.  Band aid applied.  VSS with discharge and left in satisfactory condition with no s/s of distress noted.   

## 2023-04-07 ENCOUNTER — Other Ambulatory Visit: Payer: Self-pay | Admitting: *Deleted

## 2023-04-14 ENCOUNTER — Other Ambulatory Visit: Payer: Self-pay

## 2023-04-14 DIAGNOSIS — D500 Iron deficiency anemia secondary to blood loss (chronic): Secondary | ICD-10-CM

## 2023-04-15 ENCOUNTER — Inpatient Hospital Stay: Payer: Medicare Other

## 2023-04-15 ENCOUNTER — Inpatient Hospital Stay (HOSPITAL_BASED_OUTPATIENT_CLINIC_OR_DEPARTMENT_OTHER): Payer: Medicare Other | Admitting: Oncology

## 2023-04-15 VITALS — BP 127/65 | HR 68 | Temp 97.4°F | Resp 16 | Wt 177.0 lb

## 2023-04-15 DIAGNOSIS — D696 Thrombocytopenia, unspecified: Secondary | ICD-10-CM | POA: Diagnosis not present

## 2023-04-15 DIAGNOSIS — D500 Iron deficiency anemia secondary to blood loss (chronic): Secondary | ICD-10-CM

## 2023-04-15 LAB — IRON AND TIBC
Iron: 78 ug/dL (ref 45–182)
Saturation Ratios: 23 % (ref 17.9–39.5)
TIBC: 347 ug/dL (ref 250–450)
UIBC: 269 ug/dL

## 2023-04-15 LAB — CBC WITH DIFFERENTIAL/PLATELET
Abs Immature Granulocytes: 0.03 10*3/uL (ref 0.00–0.07)
Basophils Absolute: 0 10*3/uL (ref 0.0–0.1)
Basophils Relative: 0 %
Eosinophils Absolute: 0.1 10*3/uL (ref 0.0–0.5)
Eosinophils Relative: 2 %
HCT: 27.1 % — ABNORMAL LOW (ref 39.0–52.0)
Hemoglobin: 8.8 g/dL — ABNORMAL LOW (ref 13.0–17.0)
Immature Granulocytes: 1 %
Lymphocytes Relative: 17 %
Lymphs Abs: 0.5 10*3/uL — ABNORMAL LOW (ref 0.7–4.0)
MCH: 32.5 pg (ref 26.0–34.0)
MCHC: 32.5 g/dL (ref 30.0–36.0)
MCV: 100 fL (ref 80.0–100.0)
Monocytes Absolute: 0.7 10*3/uL (ref 0.1–1.0)
Monocytes Relative: 22 %
Neutro Abs: 1.8 10*3/uL (ref 1.7–7.7)
Neutrophils Relative %: 58 %
Platelets: 78 10*3/uL — ABNORMAL LOW (ref 150–400)
RBC: 2.71 MIL/uL — ABNORMAL LOW (ref 4.22–5.81)
RDW: 16 % — ABNORMAL HIGH (ref 11.5–15.5)
WBC: 3.1 10*3/uL — ABNORMAL LOW (ref 4.0–10.5)
nRBC: 0 % (ref 0.0–0.2)

## 2023-04-15 LAB — FERRITIN: Ferritin: 79 ng/mL (ref 24–336)

## 2023-04-15 NOTE — Progress Notes (Signed)
[facility] [address], [place] [address]   Clinic Day:  04/15/2023  Referring physician: Elfredia Nevins, MD  Patient Care Team: Elfredia Nevins, MD as PCP - General (Internal Medicine) Jonelle Sidle, MD as PCP - Cardiology (Cardiology) Jonelle Sidle, MD as Consulting Physician (Cardiology) Jena Gauss Gerrit Friends, MD as Consulting Physician (Gastroenterology) Doreatha Massed, MD as Medical Oncologist (Hematology)   ASSESSMENT & PLAN:   Assessment:  1.  Thrombocytopenia: - Patient seen at the request of Dr. Sherwood Gambler - 01/29/2023: PLT-88, B12 and folic acid normal - 04/24/2022: Platelets 99 - CTAP (02/03/2023): Splenomegaly 18 cm, liver cirrhosis, portal hypertension  2.  Normocytic anemia: - 01/28/2013: Hb-9.2, MCV-90, WBC-3.2, creatinine-1.06 - He has history of iron deficiency anemia secondary to recurrent GI bleed from small bowel/colonic AVMs.  History of esophageal varices without prior bleeding on beta-blocker. - Colonoscopy (04/14/2022): Nonbleeding internal hemorrhoids, single ulcer in the cecum, 5 nonbleeding colonic angiodysplastic lesions treated with APC. - EGD (01/23/2022): Columns of grade 1-2 varices, portal gastropathy, normal duodenum - He has been taking iron tablet 1-2 times daily for almost a year.  He had to have blood transfusion last year.  No ice pica.  No previous parenteral iron therapy.  3.  Social/family history: - Lives at home with his wife.  Independent of ADLs and IADLs.  Seen today with his daughter.  He is a retired [employer] architect.  Quit smoking more than 20 years ago. - Half brother had prostate cancer.  Half sister had breast cancer.  Plan:  1.  Moderate thrombocytopenia: - Platelet count has ranged from 77-125 over the past 8 to 9 years. -Etiology felt to be secondary to splenomegaly and liver disease. -Will continue to monitor labs every few months.  2.  Normocytic anemia: - Hemoglobin has fluctuated over the  years with counts as low as 6.5- normal.  -Most recently he has been between 8-9.  -Previous workup did not reveal any underlying blood dyscrasia or hemolytic anemia. -No evidence of active bleed per patient. -Iron levels were low with a ferritin of 16 and iron saturation 21%.  He was given 1 g of Monoferric on 03/27/2023. -He is currently taking iron supplements twice daily with good tolerance. -Lab work from today shows a hemoglobin of 8.8 and platelet count of 78,000.  -Recommend additional IV iron with 1 g Monoferric. -Follow-up in 6 to 8 weeks and assessment.  PLAN SUMMARY: >> Recommend 1 g Monoferric IV iron. >> Recommend follow-up with labs (CBC, Iron and ferritin) in 6 to 8 weeks.    No orders of the defined types were placed in this encounter.   I spent 20 minutes dedicated to the care of this patient (face-to-face and non-face-to-face) on the date of the encounter to include what is described in the assessment and plan.   Mauro Kaufmann, NP   7/31/20248:42 AM  CHIEF COMPLAINT/PURPOSE OF CONSULT:   Diagnosis: thrombocytopenia   Current Therapy: Under workup  HISTORY OF PRESENT ILLNESS:   Derek Drake is a 77 y.o. male presenting to clinic today to review recent lab work.  He was sent over at the request of Dr. Sherwood Gambler.  He was last seen in clinic by Dr. Ellin Saba on 02/20/2023.  Previous workup by Dr. Ubaldo Glassing in March 2017 felt thrombocytopenia was secondary to liver disorder and associated splenomegaly.  No treatment was recommended at that time and she was released to follow-up with PCP.  Over the past few years, his platelet  count continues to decline with fluctuation between 75,000-130,000 since 2015.   He has known anemia from upper GI blood loss and is followed by [facility].  He was taken off both Eliquis and aspirin in 2023 due to this.  In the interim, he has been well.  Appetite is 40% energy levels are 100%.  Denies any pain.  Has history of loose stools first thing in  the morning but this has improved since being on oral iron.  Has occasional dizziness and headaches.  Continues to deny any GI bleeding. States he feels the best he is felt in many years.  Occasionally will feel tired but he is able to do more yard work than usual.  PAST MEDICAL HISTORY:   Past Medical History: Past Medical History:  Diagnosis Date   Cirrhosis (HCC)    Immune to Hep A. Received Hep B vaccination in 2022.   Essential hypertension    GERD (gastroesophageal reflux disease)    Hypercholesteremia    PAF (paroxysmal atrial fibrillation) (HCC) 05/04/2014   Type 2 diabetes mellitus [facility])     Surgical History: Past Surgical History:  Procedure Laterality Date   BIOPSY  02/05/2017   Procedure: BIOPSY;  Surgeon: Corbin Ade, MD;  [facility];  Service: Endoscopy;;  gastric colon   CHOLECYSTECTOMY     COLONOSCOPY N/A 05/13/2013   Procedure: COLONOSCOPY;  Surgeon: Corbin Ade, MD;  [facility];  Service: Endoscopy;  Laterality: N/A;  8:30 AM   COLONOSCOPY N/A 02/05/2017   Procedure: COLONOSCOPY;  Surgeon: Corbin Ade, MD;  [facility];  Service: Endoscopy;  Laterality: N/A;   COLONOSCOPY WITH PROPOFOL N/A 03/26/2022   Procedure: COLONOSCOPY WITH PROPOFOL;  Surgeon: Corbin Ade, MD;  [facility];  Service: Endoscopy;  Laterality: N/A;  7:30am, moved to 7/12 @ 2:45   COLONOSCOPY WITH PROPOFOL N/A 04/14/2022   Procedure: COLONOSCOPY WITH PROPOFOL;  Surgeon: Lanelle Bal, DO;  [facility];  Service: Endoscopy;  Laterality: N/A;   ESOPHAGOGASTRODUODENOSCOPY N/A 02/05/2017   Procedure: ESOPHAGOGASTRODUODENOSCOPY (EGD);  Surgeon: Corbin Ade, MD;  [facility];  Service: Endoscopy;  Laterality: N/A;  730 - moved to 12:00    ESOPHAGOGASTRODUODENOSCOPY N/A 07/04/2020   Procedure: ESOPHAGOGASTRODUODENOSCOPY (EGD);  Surgeon: Corbin Ade, MD;  [facility];  Service: Endoscopy;   Laterality: N/A;  12:45pm   ESOPHAGOGASTRODUODENOSCOPY (EGD) WITH PROPOFOL N/A 01/23/2022   3 columns of grade 1-2 varices, portal gastropathy, normal duodenum. Begin Nadolol 20 mg daily.   GIVENS CAPSULE STUDY N/A 07/04/2020   Procedure: GIVENS CAPSULE STUDY;  Surgeon: Corbin Ade, MD;  [facility];  Service: Endoscopy;  Laterality: N/A;  Elected not to place a capsule today   GIVENS CAPSULE STUDY N/A 04/12/2022   Procedure: GIVENS CAPSULE STUDY;  Surgeon: Dolores Frame, MD;  [facility];  Service: Gastroenterology;  Laterality: N/A;   HOT HEMOSTASIS  03/26/2022   Procedure: HOT HEMOSTASIS (ARGON PLASMA COAGULATION/BICAP);  Surgeon: Corbin Ade, MD;  [facility];  Service: Endoscopy;;   HOT HEMOSTASIS  04/14/2022   Procedure: HOT HEMOSTASIS (ARGON PLASMA COAGULATION/BICAP);  Surgeon: Lanelle Bal, DO;  [facility];  Service: Endoscopy;;   POLYPECTOMY  02/05/2017   Procedure: POLYPECTOMY;  Surgeon: Corbin Ade, MD;  [facility];  Service: Endoscopy;;  colon    Social History: Social History   Socioeconomic History  Marital status: Married    Spouse name: Not on file   Number of children: Not on file   Years of education: Not on file   Highest education level: Not on file  Occupational History   Not on file  Tobacco Use   Smoking status: Former    Current packs/day: 0.00    Average packs/day: 2.0 packs/day for 53.0 years (106.0 ttl pk-yrs)    Types: Cigarettes    Start date: 05/25/1956    Quit date: 05/25/2009    Years since quitting: 13.8   Smokeless tobacco: Current    Types: Chew  Vaping Use   Vaping status: Never Used  Substance and Sexual Activity   Alcohol use: Not Currently   Drug use: No   Sexual activity: Yes  Other Topics Concern   Not on file  Social History Narrative   Not on file   Social Determinants of Health   Financial Resource Strain: Low Risk  (05/07/2022)   Received  from [facility], [facility]   Overall Financial Resource Strain (CARDIA)    Difficulty of Paying Living Expenses: Not hard at all  Food Insecurity: No Food Insecurity (05/07/2022)   Received from [facility], [facility]   Hunger Vital Sign    Worried About Running Out of Food in the Last Year: Never true    Ran Out of Food in the Last Year: Never true  Transportation Needs: No Transportation Needs (05/07/2022)   Received from [facility], [facility]   [facility] - Transportation    Lack of Transportation (Medical): No    Lack of Transportation (Non-Medical): No  Physical Activity: Inactive (05/07/2022)   Received from [facility], [facility]   Exercise Vital Sign    Days of Exercise per Week: 0 days    Minutes of Exercise per Session: 0 min  Stress: No Stress Concern Present (05/07/2022)   Received from [facility], [facility] of Occupational Health - Occupational Stress Questionnaire    Feeling of Stress : Not at all  Social Connections: Socially Integrated (05/07/2022)   Received from [facility], [facility]   Social Connection and Isolation Panel [NHANES]    Frequency of Communication with Friends and Family: More than three times a week    Frequency of Social Gatherings with Friends and Family: More than three times a week    Attends Religious Services: More than 4 times per year    Active Member of [employer] or Organizations: Yes    Attends Engineer, structural: More than 4 times per year    Marital Status: Married  Catering manager Violence: Not At Risk (05/07/2022)   Received from [facility], [facility]   Humiliation, Afraid, Rape, and Kick questionnaire    Fear of Current or Ex-Partner: No    Emotionally Abused: No    Physically Abused: No    Sexually Abused: No    Family History: Family History  Problem Relation Age of Onset   Prostate cancer Brother    Congestive Heart Failure Mother     Heart failure Brother    Colon cancer Neg Hx     Current Medications:  Current Outpatient Medications:    cholecalciferol (VITAMIN D3) 25 MCG (1000 UNIT) tablet, Take 1,000 Units by mouth daily., Disp: , Rfl:    CREON 36000-114000 units CPEP capsule, Take by mouth., Disp: , Rfl:  [employer] IRON 325 (65 Fe) MG tablet, Take 325 mg by mouth 2 (two) times daily., Disp: , Rfl:    diltiazem (CARDIZEM CD) 360 MG 24 hr capsule, Take 360 mg by mouth daily. , Disp: , Rfl:    gabapentin (NEURONTIN) 100 MG capsule, Take 100 mg by mouth at bedtime., Disp: , Rfl:    Krill Oil 500 MG CAPS, Take 500 mg by mouth daily., Disp: , Rfl:    losartan (COZAAR) 100 MG tablet, Take 100 mg by mouth daily., Disp: , Rfl:    metFORMIN (GLUCOPHAGE) 500 MG tablet, Take 1,000 mg by mouth 2 (two) times daily with a meal., Disp: , Rfl:    milk thistle 175 MG tablet, Take 175 mg by mouth daily., Disp: , Rfl:    Multiple Vitamin (MULTIVITAMIN WITH MINERALS) TABS tablet, Take 1 tablet by mouth daily., Disp: , Rfl:    nadolol (CORGARD) 20 MG tablet, TAKE 1 TABLET BY MOUTH EVERY DAY, Disp: 90 tablet, Rfl: 3   pantoprazole (PROTONIX) 40 MG tablet, Take 1 tablet (40 mg total) by mouth daily before breakfast., Disp: 90 tablet, Rfl: 3   simvastatin (ZOCOR) 40 MG tablet, Take 40 mg by mouth every evening. , Disp: , Rfl:    Allergies: Allergies  Allergen Reactions   Rabbit Protein Swelling    Face swelled up   Hydrocodone-Acetaminophen Rash   Other Swelling and Rash    Dial soap   Ivp Dye [Iodinated Contrast Media] Other (See Comments)    Possible allergy... patient can not confirm or deny contrast allergy, needs pre meds pre Dr. Si Gaul (Skylarr Liz Excell Seltzer 01/27/2023)    REVIEW OF SYSTEMS:   Review of Systems  Constitutional:  Positive for fatigue.  Gastrointestinal:  Positive for diarrhea (D/t metformin). Negative for blood in stool.  Neurological:  Positive for dizziness and headaches.  Psychiatric/Behavioral:  Positive for  sleep disturbance.      VITALS:   There were no vitals taken for this visit.  Wt Readings from Last 3 Encounters:  03/13/23 176 lb 5.9 oz (80 kg)  02/20/23 174 lb (78.9 kg)  02/04/23 179 lb 3.2 oz (81.3 kg)    There is no height or weight on file to calculate BMI.   PHYSICAL EXAM:   Physical Exam Constitutional:      Appearance: Normal appearance.  Cardiovascular:     Rate and Rhythm: Normal rate and regular rhythm.  Pulmonary:     Effort: Pulmonary effort is normal.     Breath sounds: Normal breath sounds.  Abdominal:     General: Bowel sounds are normal.     Palpations: Abdomen is soft.  Musculoskeletal:        General: No swelling. Normal range of motion.  Neurological:     Mental Status: He is alert and oriented to person, place, and time. Mental status is at baseline.     LABS:      Latest Ref Rng & Units 02/20/2023    8:41 AM 02/04/2023   10:53 AM 09/22/2022    9:54 AM  CBC  WBC 4.0 - 10.5 K/uL 3.3  5.5  3.0   Hemoglobin 13.0 - 17.0 g/dL 9.5  8.9  9.4   Hematocrit 39.0 - 52.0 % 28.5  26.8  28.5   Platelets 150 - 400 K/uL 80  98  78       Latest Ref Rng & Units 03/02/2023    9:38 AM 02/04/2023   10:53 AM 02/03/2023    9:27  AM  CMP  Glucose 65 - 139 mg/dL  784    BUN 7 - 25 mg/dL  18    Creatinine 6.96 - 1.28 mg/dL  2.95  2.84   Sodium 132 - 146 mmol/L  137    Potassium 3.5 - 5.3 mmol/L  4.6    Chloride 98 - 110 mmol/L  101    CO2 20 - 32 mmol/L  25    Calcium 8.6 - 10.2 mg/dL 9.4  44.0    Total Protein 6.1 - 8.1 g/dL  6.2    Total Bilirubin 0.2 - 1.2 mg/dL  0.4    AST 10 - 35 U/L  27    ALT 9 - 46 U/L  22       No results found for: "CEA1", "CEA" / No results found for: "CEA1", "CEA" No results found for: "PSA1" No results found for: "NUU725" No results found for: "CAN125"  Lab Results  Component Value Date   TOTALPROTELP 6.2 02/20/2023   TOTALPROTELP 6.1 02/20/2023   ALBUMINELP 3.6 02/20/2023   A1GS 0.2 02/20/2023   A2GS 0.8 02/20/2023    BETS 1.0 02/20/2023   GAMS 0.5 02/20/2023   MSPIKE Not Observed 02/20/2023   SPEI Comment 02/20/2023   Lab Results  Component Value Date   TIBC 495 (H) 02/20/2023   TIBC 499 (H) 04/11/2022   TIBC 453 (H) 02/25/2022   FERRITIN 16 (L) 02/20/2023   FERRITIN 12 (L) 04/11/2022   FERRITIN 10 (L) 02/25/2022   IRONPCTSAT 21 02/20/2023   IRONPCTSAT 48 (H) 04/11/2022   IRONPCTSAT 6 (L) 02/25/2022   Lab Results  Component Value Date   LDH 128 02/20/2023     STUDIES:   No results found.

## 2023-04-22 ENCOUNTER — Encounter: Payer: Self-pay | Admitting: Hematology

## 2023-04-27 ENCOUNTER — Encounter: Payer: Self-pay | Admitting: *Deleted

## 2023-04-28 ENCOUNTER — Other Ambulatory Visit: Payer: Self-pay

## 2023-04-28 DIAGNOSIS — D500 Iron deficiency anemia secondary to blood loss (chronic): Secondary | ICD-10-CM

## 2023-04-29 ENCOUNTER — Inpatient Hospital Stay: Payer: Medicare Other | Attending: Oncology

## 2023-04-29 DIAGNOSIS — D649 Anemia, unspecified: Secondary | ICD-10-CM | POA: Diagnosis not present

## 2023-04-29 DIAGNOSIS — D500 Iron deficiency anemia secondary to blood loss (chronic): Secondary | ICD-10-CM

## 2023-04-29 LAB — CBC WITH DIFFERENTIAL/PLATELET
Abs Immature Granulocytes: 0.03 10*3/uL (ref 0.00–0.07)
Basophils Absolute: 0 10*3/uL (ref 0.0–0.1)
Basophils Relative: 0 %
Eosinophils Absolute: 0.1 10*3/uL (ref 0.0–0.5)
Eosinophils Relative: 4 %
HCT: 27.4 % — ABNORMAL LOW (ref 39.0–52.0)
Hemoglobin: 9 g/dL — ABNORMAL LOW (ref 13.0–17.0)
Immature Granulocytes: 1 %
Lymphocytes Relative: 17 %
Lymphs Abs: 0.6 10*3/uL — ABNORMAL LOW (ref 0.7–4.0)
MCH: 32.8 pg (ref 26.0–34.0)
MCHC: 32.8 g/dL (ref 30.0–36.0)
MCV: 100 fL (ref 80.0–100.0)
Monocytes Absolute: 0.7 10*3/uL (ref 0.1–1.0)
Monocytes Relative: 22 %
Neutro Abs: 1.9 10*3/uL (ref 1.7–7.7)
Neutrophils Relative %: 56 %
Platelets: 79 10*3/uL — ABNORMAL LOW (ref 150–400)
RBC: 2.74 MIL/uL — ABNORMAL LOW (ref 4.22–5.81)
RDW: 14.9 % (ref 11.5–15.5)
WBC: 3.3 10*3/uL — ABNORMAL LOW (ref 4.0–10.5)
nRBC: 0 % (ref 0.0–0.2)

## 2023-04-29 LAB — IRON AND TIBC
Iron: 92 ug/dL (ref 45–182)
Saturation Ratios: 24 % (ref 17.9–39.5)
TIBC: 380 ug/dL (ref 250–450)
UIBC: 288 ug/dL

## 2023-04-29 LAB — FERRITIN: Ferritin: 36 ng/mL (ref 24–336)

## 2023-04-30 ENCOUNTER — Inpatient Hospital Stay (HOSPITAL_BASED_OUTPATIENT_CLINIC_OR_DEPARTMENT_OTHER): Payer: Medicare Other | Admitting: Oncology

## 2023-04-30 DIAGNOSIS — D649 Anemia, unspecified: Secondary | ICD-10-CM

## 2023-04-30 DIAGNOSIS — D500 Iron deficiency anemia secondary to blood loss (chronic): Secondary | ICD-10-CM | POA: Diagnosis not present

## 2023-04-30 DIAGNOSIS — K922 Gastrointestinal hemorrhage, unspecified: Secondary | ICD-10-CM | POA: Diagnosis not present

## 2023-04-30 DIAGNOSIS — D696 Thrombocytopenia, unspecified: Secondary | ICD-10-CM

## 2023-04-30 NOTE — Progress Notes (Signed)
[facility] [address], [place] [address]   Clinic Day:  04/30/2023  Referring physician: Elfredia Nevins, MD  Patient Care Team: Elfredia Nevins, MD as PCP - General (Internal Medicine) Jonelle Sidle, MD as PCP - Cardiology (Cardiology) Jonelle Sidle, MD as Consulting Physician (Cardiology) Jena Gauss Gerrit Friends, MD as Consulting Physician (Gastroenterology) Doreatha Massed, MD as Medical Oncologist (Hematology)  I connected with Derek Drake on 05/04/23 at 11:30 AM EDT by telephone visit and verified that I am speaking with the correct person using two identifiers.   I discussed the limitations, risks, security and privacy concerns of performing an evaluation and management service by telemedicine and the availability of in-person appointments. I also discussed with the patient that there may be a patient responsible charge related to this service. The patient expressed understanding and agreed to proceed.   Other persons participating in the visit and their role in the encounter: NP, Patient    Patient's location: Home  Provider's location: Clinic    ASSESSMENT & PLAN:   Assessment:  1.  Thrombocytopenia: - Patient seen at the request of Dr. Sherwood Gambler - 01/29/2023: PLT-88, B12 and folic acid normal - 04/24/2022: Platelets 99 - CTAP (02/03/2023): Splenomegaly 18 cm, liver cirrhosis, portal hypertension  2.  Normocytic anemia: - 01/28/2013: Hb-9.2, MCV-90, WBC-3.2, creatinine-1.06 - He has history of iron deficiency anemia secondary to recurrent GI bleed from small bowel/colonic AVMs.  History of esophageal varices without prior bleeding on beta-blocker. - Colonoscopy (04/14/2022): Nonbleeding internal hemorrhoids, single ulcer in the cecum, 5 nonbleeding colonic angiodysplastic lesions treated with APC. - EGD (01/23/2022): Columns of grade 1-2 varices, portal gastropathy, normal duodenum - He has been taking iron tablet 1-2 times daily for almost a  year.  He had to have blood transfusion last year.  No ice pica.  No previous parenteral iron therapy.  3.  Social/family history: - Lives at home with his wife.  Independent of ADLs and IADLs.  Seen today with his daughter.  He is a retired [employer] architect.  Quit smoking more than 20 years ago. - Half brother had prostate cancer.  Half sister had breast cancer.  Plan:  1.  Moderate thrombocytopenia: - Platelet count has ranged from 77-125 over the past 8 to 9 years. -Etiology felt to be secondary to splenomegaly and liver disease. -Will continue to monitor labs every few months. -Platelet count today is 79,000.  2.  Normocytic anemia: - Hemoglobin has fluctuated over the years with counts as low as 6.5- normal.  -Most recently he has been between 8-9.  -Previous workup did not reveal any underlying blood dyscrasia or hemolytic anemia. -No evidence of active bleed per patient. -Iron levels were low with a ferritin of 16 and iron saturation 21%.  He was given 1 g of Monoferric on 03/27/2023. -He is currently taking iron supplements twice daily with good tolerance. -Lab work from today shows a ferritin of 36, iron saturation 24% and hemoglobin 9.0.  Platelet count is 79,000. -Discussed case with Dr. Ellin Saba who recommends bone marrow biopsy. -Schedule bone marrow biopsy.  Schedule follow-up with Dr. Ellin Saba approximately 2 weeks after to review results.  PLAN SUMMARY: >> Schedule bone marrow biopsy. >> RTC approximately 2 weeks after to discuss results with Dr. Ellin Saba.    No orders of the defined types were placed in this encounter.  I provided 20 minutes of non face-to-face telephone visit time during this encounter, and > 50% was spent counseling  as documented under my assessment & plan.   Mauro Kaufmann, NP   8/15/202411:23 AM  CHIEF COMPLAINT/PURPOSE OF CONSULT:   Diagnosis: thrombocytopenia   Current Therapy: Under workup  HISTORY OF PRESENT ILLNESS:   Derek Drake is  a 77 y.o. male presenting to clinic today to review recent lab work.  He was sent over at the request of Dr. Sherwood Gambler.  He was last seen in clinic by me on 04/15/2023.  Previous workup by Dr. Ubaldo Glassing in March 2017 felt thrombocytopenia was secondary to liver disorder and associated splenomegaly.  No treatment was recommended at that time and she was released to follow-up with PCP.  Over the past few years, his platelet count continues to decline with fluctuation between 75,000-130,000 since 2015.   He has known anemia from upper GI blood loss and is followed by [facility].  He was taken off both Eliquis and aspirin in 2023 due to this.  He received 1 dose of 1 g Monoferric on 03/27/2023.  Repeat labs a few weeks later did not show significant improvement of his hemoglobin or iron levels.  He returns today, to recheck labs.  Denies any bleeding.  Reports he has been drinking 4 to 5 glasses of water each day and feels much better.  His energy levels are 75% appetite is 60%.  He denies any pain.   PAST MEDICAL HISTORY:   Past Medical History: Past Medical History:  Diagnosis Date   Cirrhosis (HCC)    Immune to Hep A. Received Hep B vaccination in 2022.   Essential hypertension    GERD (gastroesophageal reflux disease)    Hypercholesteremia    PAF (paroxysmal atrial fibrillation) (HCC) 05/04/2014   Type 2 diabetes mellitus [facility])     Surgical History: Past Surgical History:  Procedure Laterality Date   BIOPSY  02/05/2017   Procedure: BIOPSY;  Surgeon: Corbin Ade, MD;  [facility];  Service: Endoscopy;;  gastric colon   CHOLECYSTECTOMY     COLONOSCOPY N/A 05/13/2013   Procedure: COLONOSCOPY;  Surgeon: Corbin Ade, MD;  [facility];  Service: Endoscopy;  Laterality: N/A;  8:30 AM   COLONOSCOPY N/A 02/05/2017   Procedure: COLONOSCOPY;  Surgeon: Corbin Ade, MD;  [facility];  Service: Endoscopy;  Laterality: N/A;   COLONOSCOPY WITH PROPOFOL N/A  03/26/2022   Procedure: COLONOSCOPY WITH PROPOFOL;  Surgeon: Corbin Ade, MD;  [facility];  Service: Endoscopy;  Laterality: N/A;  7:30am, moved to 7/12 @ 2:45   COLONOSCOPY WITH PROPOFOL N/A 04/14/2022   Procedure: COLONOSCOPY WITH PROPOFOL;  Surgeon: Lanelle Bal, DO;  [facility];  Service: Endoscopy;  Laterality: N/A;   ESOPHAGOGASTRODUODENOSCOPY N/A 02/05/2017   Procedure: ESOPHAGOGASTRODUODENOSCOPY (EGD);  Surgeon: Corbin Ade, MD;  [facility];  Service: Endoscopy;  Laterality: N/A;  730 - moved to 12:00    ESOPHAGOGASTRODUODENOSCOPY N/A 07/04/2020   Procedure: ESOPHAGOGASTRODUODENOSCOPY (EGD);  Surgeon: Corbin Ade, MD;  [facility];  Service: Endoscopy;  Laterality: N/A;  12:45pm   ESOPHAGOGASTRODUODENOSCOPY (EGD) WITH PROPOFOL N/A 01/23/2022   3 columns of grade 1-2 varices, portal gastropathy, normal duodenum. Begin Nadolol 20 mg daily.   GIVENS CAPSULE STUDY N/A 07/04/2020   Procedure: GIVENS CAPSULE STUDY;  Surgeon: Corbin Ade, MD;  [facility];  Service: Endoscopy;  Laterality: N/A;  Elected not to place a capsule today   GIVENS CAPSULE STUDY N/A 04/12/2022   Procedure: GIVENS  CAPSULE STUDY;  Surgeon: Marguerita Merles, Reuel Boom, MD;  [facility];  Service: Gastroenterology;  Laterality: N/A;   HOT HEMOSTASIS  03/26/2022   Procedure: HOT HEMOSTASIS (ARGON PLASMA COAGULATION/BICAP);  Surgeon: Corbin Ade, MD;  [facility];  Service: Endoscopy;;   HOT HEMOSTASIS  04/14/2022   Procedure: HOT HEMOSTASIS (ARGON PLASMA COAGULATION/BICAP);  Surgeon: Lanelle Bal, DO;  [facility];  Service: Endoscopy;;   POLYPECTOMY  02/05/2017   Procedure: POLYPECTOMY;  Surgeon: Corbin Ade, MD;  [facility];  Service: Endoscopy;;  colon    Social History: Social History   Socioeconomic History   Marital status: Married    Spouse name: Not on file   Number of  children: Not on file   Years of education: Not on file   Highest education level: Not on file  Occupational History   Not on file  Tobacco Use   Smoking status: Former    Current packs/day: 0.00    Average packs/day: 2.0 packs/day for 53.0 years (106.0 ttl pk-yrs)    Types: Cigarettes    Start date: 05/25/1956    Quit date: 05/25/2009    Years since quitting: 13.9   Smokeless tobacco: Current    Types: Chew  Vaping Use   Vaping status: Never Used  Substance and Sexual Activity   Alcohol use: Not Currently   Drug use: No   Sexual activity: Yes  Other Topics Concern   Not on file  Social History Narrative   Not on file   Social Determinants of Health   Financial Resource Strain: Low Risk  (05/07/2022)   Received [facility], [facility]   Overall Financial Resource Strain (CARDIA)    Difficulty of Paying Living Expenses: Not hard at all  Food Insecurity: No Food Insecurity (05/07/2022)   Received from [facility], [facility]   Hunger Vital Sign    Worried About Running Out of Food in the Last Year: Never true    Ran Out of Food in the Last Year: Never true  Transportation Needs: No Transportation Needs (05/07/2022)   Received from [facility], [facility]   [facility] - Transportation    Lack of Transportation (Medical): No    Lack of Transportation (Non-Medical): No  Physical Activity: Inactive (05/07/2022)   Received from [facility], [facility]   Exercise Vital Sign    Days of Exercise per Week: 0 days    Minutes of Exercise per Session: 0 min  Stress: No Stress Concern Present (05/07/2022)   Received from [facility], [facility] - Occupational Stress Questionnaire    Feeling of Stress : Not at all  Social Connections: Socially Integrated (05/07/2022)   Received from [facility], [facility]   Social Connection and Isolation Panel [NHANES]    Frequency of Communication with  Friends and Family: More than three times a week    Frequency of Social Gatherings with Friends and Family: More than three times a week    Attends Religious Services: More than 4 times per year    Active Member of [employer] or Organizations: Yes    Attends Banker Meetings: More than 4 times per year    Marital Status: Married  Catering manager Violence: Not At Risk (05/07/2022)   Received from [facility], [facility]   Humiliation, Afraid, Rape, and  Kick questionnaire    Fear of Current or Ex-Partner: No    Emotionally Abused: No    Physically Abused: No    Sexually Abused: No    Family History: Family History  Problem Relation Age of Onset   Prostate cancer Brother    Congestive Heart Failure Mother    Heart failure Brother    Colon cancer Neg Hx     Current Medications:  Current Outpatient Medications:    cholecalciferol (VITAMIN D3) 25 MCG (1000 UNIT) tablet, Take 1,000 Units by mouth daily., Disp: , Rfl:    clotrimazole-betamethasone (LOTRISONE) cream, Apply topically 2 (two) times daily., Disp: , Rfl:    CREON 36000-114000 units CPEP capsule, Take by mouth., Disp: , Rfl:    [employer] IRON 325 (65 Fe) MG tablet, Take 325 mg by mouth 2 (two) times daily., Disp: , Rfl:    diltiazem (CARDIZEM CD) 360 MG 24 hr capsule, Take 360 mg by mouth daily. , Disp: , Rfl:    gabapentin (NEURONTIN) 100 MG capsule, Take 100 mg by mouth at bedtime., Disp: , Rfl:    Krill Oil 500 MG CAPS, Take 500 mg by mouth daily., Disp: , Rfl:    losartan (COZAAR) 100 MG tablet, Take 100 mg by mouth daily., Disp: , Rfl:    metFORMIN (GLUCOPHAGE) 500 MG tablet, Take 1,000 mg by mouth 2 (two) times daily with a meal., Disp: , Rfl:    milk thistle 175 MG tablet, Take 175 mg by mouth daily., Disp: , Rfl:    Multiple Vitamin (MULTIVITAMIN WITH MINERALS) TABS tablet, Take 1 tablet by mouth daily., Disp: , Rfl:    nadolol (CORGARD) 20 MG tablet, TAKE 1 TABLET BY MOUTH EVERY DAY, Disp: 90 tablet, Rfl:  3   pantoprazole (PROTONIX) 40 MG tablet, Take 1 tablet (40 mg total) by mouth daily before breakfast., Disp: 90 tablet, Rfl: 3   simvastatin (ZOCOR) 40 MG tablet, Take 40 mg by mouth every evening. , Disp: , Rfl:    tamsulosin (FLOMAX) 0.4 MG CAPS capsule, Take 0.4 mg by mouth daily., Disp: , Rfl:    Allergies: Allergies  Allergen Reactions   Rabbit Protein Swelling    Face swelled up   Hydrocodone-Acetaminophen Rash   Other Swelling and Rash    Dial soap   Ivp Dye [Iodinated Contrast Media] Other (See Comments)    Possible allergy... patient can not confirm or deny contrast allergy, needs pre meds pre Dr. Si Gaul (Hollin Crewe Excell Seltzer 01/27/2023)    REVIEW OF SYSTEMS:   Review of Systems - Oncology   VITALS:   There were no vitals taken for this visit.  Wt Readings from Last 3 Encounters:  04/15/23 177 lb 0.5 oz (80.3 kg)  03/13/23 176 lb 5.9 oz (80 kg)  02/20/23 174 lb (78.9 kg)    There is no height or weight on file to calculate BMI.   PHYSICAL EXAM:   Physical Exam Neurological:     Mental Status: He is alert and oriented to person, place, and time.     LABS:      Latest Ref Rng & Units 04/29/2023   11:09 AM 04/15/2023    8:25 AM 02/20/2023    8:41 AM  CBC  WBC 4.0 - 10.5 K/uL 3.3  3.1  3.3   Hemoglobin 13.0 - 17.0 g/dL 9.0  8.8  9.5   Hematocrit 39.0 - 52.0 % 27.4  27.1  28.5   Platelets 150 - 400 K/uL 79  78  80       Latest Ref Rng & Units 03/02/2023    9:38 AM 02/04/2023   10:53 AM 02/03/2023    9:27 AM  CMP  Glucose 65 - 139 mg/dL  956    BUN 7 - 25 mg/dL  18    Creatinine 2.13 - 1.28 mg/dL  0.86  5.78   Sodium 469 - 146 mmol/L  137    Potassium 3.5 - 5.3 mmol/L  4.6    Chloride 98 - 110 mmol/L  101    CO2 20 - 32 mmol/L  25    Calcium 8.6 - 10.2 mg/dL 9.4  62.9    Total Protein 6.1 - 8.1 g/dL  6.2    Total Bilirubin 0.2 - 1.2 mg/dL  0.4    AST 10 - 35 U/L  27    ALT 9 - 46 U/L  22       No results found for: "CEA1", "CEA" / No results found for:  "CEA1", "CEA" No results found for: "PSA1" No results found for: "CAN199" No results found for: "CAN125"  Lab Results  Component Value Date   TOTALPROTELP 6.2 02/20/2023   TOTALPROTELP 6.1 02/20/2023   ALBUMINELP 3.6 02/20/2023   A1GS 0.2 02/20/2023   A2GS 0.8 02/20/2023   BETS 1.0 02/20/2023   GAMS 0.5 02/20/2023   MSPIKE Not Observed 02/20/2023   SPEI Comment 02/20/2023   Lab Results  Component Value Date   TIBC 380 04/29/2023   TIBC 347 04/15/2023   TIBC 495 (H) 02/20/2023   FERRITIN 36 04/29/2023   FERRITIN 79 04/15/2023   FERRITIN 16 (L) 02/20/2023   IRONPCTSAT 24 04/29/2023   IRONPCTSAT 23 04/15/2023   IRONPCTSAT 21 02/20/2023   Lab Results  Component Value Date   LDH 128 02/20/2023     STUDIES:   No results found.

## 2023-05-10 ENCOUNTER — Other Ambulatory Visit: Payer: Self-pay | Admitting: Gastroenterology

## 2023-05-15 ENCOUNTER — Inpatient Hospital Stay: Payer: Medicare Other

## 2023-05-15 ENCOUNTER — Ambulatory Visit: Payer: Medicare Other

## 2023-05-15 VITALS — BP 127/58 | HR 70 | Temp 98.5°F | Resp 18

## 2023-05-15 DIAGNOSIS — D500 Iron deficiency anemia secondary to blood loss (chronic): Secondary | ICD-10-CM

## 2023-05-15 DIAGNOSIS — D649 Anemia, unspecified: Secondary | ICD-10-CM | POA: Diagnosis not present

## 2023-05-15 MED ORDER — CETIRIZINE HCL 10 MG PO TABS
10.0000 mg | ORAL_TABLET | Freq: Once | ORAL | Status: AC
  Administered 2023-05-15: 10 mg via ORAL
  Filled 2023-05-15: qty 1

## 2023-05-15 MED ORDER — SODIUM CHLORIDE 0.9 % IV SOLN: Freq: Once | INTRAVENOUS | Status: AC

## 2023-05-15 MED ORDER — FAMOTIDINE IN NACL 20-0.9 MG/50ML-% IV SOLN
20.0000 mg | Freq: Once | INTRAVENOUS | Status: AC
  Administered 2023-05-15: 20 mg via INTRAVENOUS
  Filled 2023-05-15: qty 50

## 2023-05-15 MED ORDER — ACETAMINOPHEN 325 MG PO TABS
650.0000 mg | ORAL_TABLET | Freq: Once | ORAL | Status: AC
  Administered 2023-05-15: 650 mg via ORAL
  Filled 2023-05-15: qty 2

## 2023-05-15 MED ORDER — SODIUM CHLORIDE 0.9 % IV SOLN
1000.0000 mg | Freq: Once | INTRAVENOUS | Status: AC
  Administered 2023-05-15: 1000 mg via INTRAVENOUS
  Filled 2023-05-15: qty 1000

## 2023-05-15 NOTE — Patient Instructions (Signed)
 [facility]  Discharge Instructions: Thank you for choosing [facility] to provide your oncology and hematology care.  If you have a lab appointment with the Cancer Center - please note that after April 8th, 2024, all labs will be drawn in the cancer center.  You do not have to check in or register with the main entrance as you have in the past but will complete your check-in in the cancer center.  Wear comfortable clothing and clothing appropriate for easy access to any Portacath or PICC line.   We strive to give you quality time with your provider. You may need to reschedule your appointment if you arrive late (15 or more minutes).  Arriving late affects you and other patients whose appointments are after yours.  Also, if you miss three or more appointments without notifying the office, you may be dismissed from the clinic at the provider's discretion.      For prescription refill requests, have your pharmacy contact our office and allow 72 hours for refills to be completed.    Today you received the following chemotherapy and/or immunotherapy agents Feraheme. Ferumoxytol Injection What is this medication? FERUMOXYTOL (FER ue MOX i tol) treats low levels of iron in your body (iron deficiency anemia). Iron is a mineral that plays an important role in making red blood cells, which carry oxygen from your lungs to the rest of your body. This medicine may be used for other purposes; ask your health care provider or pharmacist if you have questions. COMMON BRAND NAME(S): Feraheme What should I tell my care team before I take this medication? They need to know if you have any of these conditions: Anemia not caused by low iron levels High levels of iron in the blood Magnetic resonance imaging (MRI) test scheduled An unusual or allergic reaction to iron, other medications, foods, dyes, or preservatives Pregnant or trying to get pregnant Breastfeeding How should I  use this medication? This medication is injected into a vein. It is given by your care team in a hospital or clinic setting. Talk to your care team the use of this medication in children. Special care may be needed. Overdosage: If you think you have taken too much of this medicine contact a poison control center or emergency room at once. NOTE: This medicine is only for you. Do not share this medicine with others. What if I miss a dose? It is important not to miss your dose. Call your care team if you are unable to keep an appointment. What may interact with this medication? Other iron products This list may not describe all possible interactions. Give your health care provider a list of all the medicines, herbs, non-prescription drugs, or dietary supplements you use. Also tell them if you smoke, drink alcohol, or use illegal drugs. Some items may interact with your medicine. What should I watch for while using this medication? Visit your care team regularly. Tell your care team if your symptoms do not start to get better or if they get worse. You may need blood work done while you are taking this medication. You may need to follow a special diet. Talk to your care team. Foods that contain iron include: whole grains/cereals, dried fruits, beans, or peas, leafy green vegetables, and organ meats (liver, kidney). What side effects may I notice from receiving this medication? Side effects that you should report to your care team as soon as possible: Allergic reactions--skin rash, itching, hives, swelling  of the face, lips, tongue, or throat Low blood pressure--dizziness, feeling faint or lightheaded, blurry vision Shortness of breath Side effects that usually do not require medical attention (report to your care team if they continue or are bothersome): Flushing Headache Joint pain Muscle pain Nausea Pain, redness, or irritation at injection site This list may not describe all possible side  effects. Call your doctor for medical advice about side effects. You may report side effects to FDA at 1-800-FDA-1088. Where should I keep my medication? This medication is given in a hospital or clinic. It will not be stored at home. NOTE: This sheet is a summary. It may not cover all possible information. If you have questions about this medicine, talk to your doctor, pharmacist, or health care provider.  2024 Elsevier/Gold Standard (2023-02-06 00:00:00)       To help prevent nausea and vomiting after your treatment, we encourage you to take your nausea medication as directed.  BELOW ARE SYMPTOMS THAT SHOULD BE REPORTED IMMEDIATELY: *FEVER GREATER THAN 100.4 F (38 C) OR HIGHER *CHILLS OR SWEATING *NAUSEA AND VOMITING THAT IS NOT CONTROLLED WITH YOUR NAUSEA MEDICATION *UNUSUAL SHORTNESS OF BREATH *UNUSUAL BRUISING OR BLEEDING *URINARY PROBLEMS (pain or burning when urinating, or frequent urination) *BOWEL PROBLEMS (unusual diarrhea, constipation, pain near the anus) TENDERNESS IN MOUTH AND THROAT WITH OR WITHOUT PRESENCE OF ULCERS (sore throat, sores in mouth, or a toothache) UNUSUAL RASH, SWELLING OR PAIN  UNUSUAL VAGINAL DISCHARGE OR ITCHING   Items with * indicate a potential emergency and should be followed up as soon as possible or go to the Emergency Department if any problems should occur.  Please show the CHEMOTHERAPY ALERT CARD or IMMUNOTHERAPY ALERT CARD at check-in to the Emergency Department and triage nurse.  Should you have questions after your visit or need to cancel or reschedule your appointment, please contact [facility] [phone]  and follow the prompts.  Office hours are 8:00 a.m. to 4:30 p.m. Monday - Friday. Please note that voicemails left after 4:00 p.m. may not be returned until the following business day.  We are closed weekends and major holidays. You have access to a nurse at all times for urgent questions. Please call the main number  to the clinic [phone] and follow the prompts.  For any non-urgent questions, you may also contact your provider using MyChart. We now offer e-Visits for anyone 44 and older to request care online for non-urgent symptoms. For details visit [email or web address].   Also download the MyChart app! Go to the app store, search "MyChart", open the app, select [facility], and log in with your MyChart username and password.

## 2023-05-15 NOTE — Progress Notes (Signed)
Patient presents today for Feraheme infusion. Vital signs stable and patient has no complaints of any side effects related to the last iron.   Feraheme given today per MD orders. Tolerated infusion without adverse affects. Vital signs stable. No complaints at this time. Discharged from clinic ambulatory in stable condition. Alert and oriented x 3. F/U with [facility] as scheduled.

## 2023-05-29 ENCOUNTER — Ambulatory Visit (HOSPITAL_COMMUNITY): Payer: Medicare Other

## 2023-06-16 ENCOUNTER — Ambulatory Visit (INDEPENDENT_AMBULATORY_CARE_PROVIDER_SITE_OTHER): Payer: Medicare Other | Admitting: Internal Medicine

## 2023-06-16 ENCOUNTER — Encounter: Payer: Self-pay | Admitting: Internal Medicine

## 2023-06-16 ENCOUNTER — Telehealth: Payer: Self-pay

## 2023-06-16 VITALS — BP 115/63 | HR 64 | Temp 97.8°F | Ht 71.0 in | Wt 180.4 lb

## 2023-06-16 DIAGNOSIS — K766 Portal hypertension: Secondary | ICD-10-CM | POA: Diagnosis not present

## 2023-06-16 DIAGNOSIS — K7460 Unspecified cirrhosis of liver: Secondary | ICD-10-CM

## 2023-06-16 DIAGNOSIS — D509 Iron deficiency anemia, unspecified: Secondary | ICD-10-CM | POA: Diagnosis not present

## 2023-06-16 DIAGNOSIS — K760 Fatty (change of) liver, not elsewhere classified: Secondary | ICD-10-CM | POA: Diagnosis not present

## 2023-06-16 NOTE — Progress Notes (Signed)
Primary Care Physician:  Elfredia Nevins, MD Primary Gastroenterologist:  Dr. Jena Gauss  Pre-Procedure History & Physical: HPI:  Derek Drake is a 77 y.o. male here for follow-up MASLD/cirrhosis.  Known varices  -  no prior GI bleed secondary to varices on Corgard.  History of IDA secondary to portal gastropathy and small large bowel AVMs.  Blood counts have been stable on iron supplementation most recently H&H 9.3 28.7 platelets 73 ferritin 36 saturation 18% ([facility] through Dr. Sharyon Medicus office).  Patient states he was taken off metformin because of nausea and dysgeusia.  CT of the abdomen by Dr. Sherwood Gambler earlier in year demonstrated no new findings.  He states since coming off of metformin appetite has improved nausea has resolved.  He has no diarrhea.  He feels like a "new man".  No recent INR for MELD labs.  GERD well-controlled on once daily pantoprazole. Past Medical History:  Diagnosis Date   Cirrhosis (HCC)    Immune to Hep A. Received Hep B vaccination in 2022.   Essential hypertension    GERD (gastroesophageal reflux disease)    Hypercholesteremia    PAF (paroxysmal atrial fibrillation) (HCC) 05/04/2014   Type 2 diabetes mellitus [facility])     Past Surgical History:  Procedure Laterality Date   BIOPSY  02/05/2017   Procedure: BIOPSY;  Surgeon: Corbin Ade, MD;  [facility];  Service: Endoscopy;;  gastric colon   CHOLECYSTECTOMY     COLONOSCOPY N/A 05/13/2013   Procedure: COLONOSCOPY;  Surgeon: Corbin Ade, MD;  [facility];  Service: Endoscopy;  Laterality: N/A;  8:30 AM   COLONOSCOPY N/A 02/05/2017   Procedure: COLONOSCOPY;  Surgeon: Corbin Ade, MD;  [facility];  Service: Endoscopy;  Laterality: N/A;   COLONOSCOPY WITH PROPOFOL N/A 03/26/2022   Procedure: COLONOSCOPY WITH PROPOFOL;  Surgeon: Corbin Ade, MD;  [facility];  Service: Endoscopy;  Laterality: N/A;  7:30am, moved to 7/12 @ 2:45   COLONOSCOPY WITH PROPOFOL  N/A 04/14/2022   Procedure: COLONOSCOPY WITH PROPOFOL;  Surgeon: Lanelle Bal, DO;  [facility];  Service: Endoscopy;  Laterality: N/A;   ESOPHAGOGASTRODUODENOSCOPY N/A 02/05/2017   Procedure: ESOPHAGOGASTRODUODENOSCOPY (EGD);  Surgeon: Corbin Ade, MD;  [facility];  Service: Endoscopy;  Laterality: N/A;  730 - moved to 12:00    ESOPHAGOGASTRODUODENOSCOPY N/A 07/04/2020   Procedure: ESOPHAGOGASTRODUODENOSCOPY (EGD);  Surgeon: Corbin Ade, MD;  [facility];  Service: Endoscopy;  Laterality: N/A;  12:45pm   ESOPHAGOGASTRODUODENOSCOPY (EGD) WITH PROPOFOL N/A 01/23/2022   3 columns of grade 1-2 varices, portal gastropathy, normal duodenum. Begin Nadolol 20 mg daily.   GIVENS CAPSULE STUDY N/A 07/04/2020   Procedure: GIVENS CAPSULE STUDY;  Surgeon: Corbin Ade, MD;  [facility];  Service: Endoscopy;  Laterality: N/A;  Elected not to place a capsule today   GIVENS CAPSULE STUDY N/A 04/12/2022   Procedure: GIVENS CAPSULE STUDY;  Surgeon: Dolores Frame, MD;  [facility];  Service: Gastroenterology;  Laterality: N/A;   HOT HEMOSTASIS  03/26/2022   Procedure: HOT HEMOSTASIS (ARGON PLASMA COAGULATION/BICAP);  Surgeon: Corbin Ade, MD;  [facility];  Service: Endoscopy;;   HOT HEMOSTASIS  04/14/2022   Procedure: HOT HEMOSTASIS (ARGON PLASMA COAGULATION/BICAP);  Surgeon: Lanelle Bal, DO;  [facility];  Service: Endoscopy;;   POLYPECTOMY  02/05/2017   Procedure: POLYPECTOMY;  Surgeon: Corbin Ade, MD;  [facility];  Service: Endoscopy;;  colon    Prior to Admission medications   Medication Sig Start Date End Date Taking? Authorizing Provider  cholecalciferol (VITAMIN D3) 25 MCG (1000 UNIT) tablet Take 1,000 Units by mouth daily.   Yes [provider]  clotrimazole-betamethasone (LOTRISONE) cream Apply topically 2 (two) times daily. 04/24/23  Yes [provider]  [facility] IRON 325 (65 Fe) MG tablet Take 325 mg by mouth 2 (two) times daily. 06/17/22  Yes [provider]  diltiazem (CARDIZEM CD) 360 MG 24 hr capsule Take 360 mg by mouth daily.    Yes [provider]  furosemide (LASIX) 20 MG tablet Take 20-40 mg by mouth daily as needed. 06/11/23  Yes [provider]  gabapentin (NEURONTIN) 100 MG capsule Take 100 mg by mouth at bedtime. 07/07/19  Yes [provider]  glimepiride (AMARYL) 2 MG tablet Take 2 mg by mouth daily. 05/21/23  Yes [provider]  Boris Lown Oil 500 MG CAPS Take 500 mg by mouth daily.   Yes [provider]  losartan (COZAAR) 100 MG tablet Take 100 mg by mouth daily. 06/17/22  Yes [provider]  milk thistle 175 MG tablet Take 175 mg by mouth daily.   Yes [provider]  Multiple Vitamin (MULTIVITAMIN WITH MINERALS) TABS tablet Take 1 tablet by mouth daily.   Yes [provider]  nadolol (CORGARD) 20 MG tablet TAKE 1 TABLET BY MOUTH EVERY DAY 01/08/23  Yes Reathel Turi, Gerrit Friends, MD  pantoprazole (PROTONIX) 40 MG tablet TAKE 1 TABLET BY MOUTH DAILY BEFORE BREAKFAST 05/11/23  Yes Tiffany Kocher, PA-C  simvastatin (ZOCOR) 40 MG tablet Take 40 mg by mouth every evening.    Yes [provider]  tamsulosin (FLOMAX) 0.4 MG CAPS capsule Take 0.4 mg by mouth daily. 04/24/23  Yes [provider]    Allergies as of 06/16/2023 - Review Complete 06/16/2023  Allergen Reaction Noted   Rabbit protein Swelling 05/13/2013   Hydrocodone-acetaminophen Rash 12/07/2015   Other Swelling and Rash 05/13/2013   Ivp dye [iodinated contrast media] Other (See Comments) 01/30/2017    Family History  Problem Relation Age of Onset   Prostate cancer Brother    Congestive Heart Failure Mother    Heart failure Brother    Colon cancer Neg Hx     Social History   Socioeconomic History   Marital status: Married    Spouse name: Not on file   Number of children:  Not on file   Years of education: Not on file   Highest education level: Not on file  Occupational History   Not on file  Tobacco Use   Smoking status: Former    Current packs/day: 0.00    Average packs/day: 2.0 packs/day for 53.0 years (106.0 ttl pk-yrs)    Types: Cigarettes    Start date: 05/25/1956    Quit date: 05/25/2009    Years since quitting: 14.0   Smokeless tobacco: Current    Types: Chew  Vaping Use   Vaping status: Never Used  Substance and Sexual Activity   Alcohol use: Not Currently   Drug use: No   Sexual activity: Yes  Other Topics Concern   Not on file  Social History Narrative   Not on file   Social Determinants of Health   Financial Resource Strain: Low Risk  (05/07/2022)   Received from [facility], [facility]   Overall Financial Resource Strain (CARDIA)  Difficulty of Paying Living Expenses: Not hard at all  Food Insecurity: No Food Insecurity (05/07/2022)   Received from [facility], [facility]   Hunger Vital Sign    Worried About Running Out of Food in the Last Year: Never true    Ran Out of Food in the Last Year: Never true  Transportation Needs: No Transportation Needs (05/07/2022)   Received from [facility], [facility]   [facility] - Transportation    Lack of Transportation (Medical): No    Lack of Transportation (Non-Medical): No  Physical Activity: Inactive (05/07/2022)   Received from [facility], [facility]   Exercise Vital Sign    Days of Exercise per Week: 0 days    Minutes of Exercise per Session: 0 min  Stress: No Stress Concern Present (05/07/2022)   Received from [facility] - Occupational Stress Questionnaire    Feeling of Stress : Not at all  Social Connections: Socially Integrated (05/07/2022)   Received from [facility], [facility]   Social Connection and Isolation Panel [NHANES]    Frequency of Communication with Friends  and Family: More than three times a week    Frequency of Social Gatherings with Friends and Family: More than three times a week    Attends Religious Services: More than 4 times per year    Active Member of [employer] or Organizations: Yes    Attends Engineer, structural: More than 4 times per year    Marital Status: Married  Catering manager Violence: Not At Risk (05/07/2022)   Received from [facility], [facility]   Humiliation, Afraid, Rape, and Kick questionnaire    Fear of Current or Ex-Partner: No    Emotionally Abused: No    Physically Abused: No    Sexually Abused: No    Review of Systems: See HPI, otherwise negative ROS  Physical Exam: BP 115/63 (BP Location: Left Arm, Patient Position: Sitting, Cuff Size: Normal)   Pulse 64   Temp 97.8 F (36.6 C) (Oral)   Ht 5\' 11"  (1.803 m)   Wt 180 lb 6.4 oz (81.8 kg)   SpO2 98%   BMI 25.16 kg/m  General:   Alert,  , well-nourished, pleasant and cooperative in NAD Abdomen: Non-distended, normal bowel sounds.  Soft and nontender without appreciable mass or hepatosplenomegaly.   Extremities:  Without clubbing or edema.  Impression/Plan: 77 year old gentleman with well compensated MASLD/cirrhosis with portal hypertension. Chronic IDA likely secondary to slow issues related to portal hypertension/bowel AVMs.  Counts are stable.  Patient feels much better since coming off metformin.  Recommendations:  No change in current medical regimen.  Liver imaging via ultrasound every 6 months.  C-Met, INR just prior to next visit.  OV in 6 months and as needed.   Notice: This dictation was prepared with Dragon dictation along with smaller phrase technology. Any transcriptional errors that result from this process are unintentional and may not be corrected upon review.

## 2023-06-16 NOTE — Telephone Encounter (Signed)
There are outside labs that are viewable under the [facility] tab that were collected on 06/11/2023 by the pt's pcp for review.

## 2023-06-16 NOTE — Patient Instructions (Signed)
It was great to see you again today!  No change in your medication regimen at this time.  We will check ultrasounds of your liver every 6 months  And see you back in 6 months (c-Met and INR just before your next office visit-lab work)  If anything comes up between now and then please do not hesitate to call.

## 2023-06-19 ENCOUNTER — Other Ambulatory Visit: Payer: Self-pay

## 2023-06-19 ENCOUNTER — Emergency Department (HOSPITAL_COMMUNITY)
Admission: EM | Admit: 2023-06-19 | Discharge: 2023-06-19 | Disposition: A | Discharge status: Home / Self Care | Payer: Medicare Other | Attending: Emergency Medicine | Admitting: Emergency Medicine

## 2023-06-19 ENCOUNTER — Emergency Department (HOSPITAL_COMMUNITY): Payer: Medicare Other

## 2023-06-19 ENCOUNTER — Encounter (HOSPITAL_COMMUNITY): Payer: Self-pay | Admitting: *Deleted

## 2023-06-19 DIAGNOSIS — E119 Type 2 diabetes mellitus without complications: Secondary | ICD-10-CM | POA: Diagnosis not present

## 2023-06-19 DIAGNOSIS — W19XXXA Unspecified fall, initial encounter: Secondary | ICD-10-CM

## 2023-06-19 DIAGNOSIS — S6992XA Unspecified injury of left wrist, hand and finger(s), initial encounter: Secondary | ICD-10-CM | POA: Diagnosis present

## 2023-06-19 DIAGNOSIS — W010XXA Fall on same level from slipping, tripping and stumbling without subsequent striking against object, initial encounter: Secondary | ICD-10-CM | POA: Diagnosis not present

## 2023-06-19 DIAGNOSIS — Z7984 Long term (current) use of oral hypoglycemic drugs: Secondary | ICD-10-CM | POA: Diagnosis not present

## 2023-06-19 DIAGNOSIS — S0990XA Unspecified injury of head, initial encounter: Secondary | ICD-10-CM

## 2023-06-19 DIAGNOSIS — Z79899 Other long term (current) drug therapy: Secondary | ICD-10-CM | POA: Insufficient documentation

## 2023-06-19 DIAGNOSIS — S60012A Contusion of left thumb without damage to nail, initial encounter: Secondary | ICD-10-CM | POA: Insufficient documentation

## 2023-06-19 DIAGNOSIS — I480 Paroxysmal atrial fibrillation: Secondary | ICD-10-CM | POA: Diagnosis not present

## 2023-06-19 DIAGNOSIS — S0003XA Contusion of scalp, initial encounter: Secondary | ICD-10-CM | POA: Diagnosis not present

## 2023-06-19 DIAGNOSIS — S50812A Abrasion of left forearm, initial encounter: Secondary | ICD-10-CM | POA: Diagnosis not present

## 2023-06-19 DIAGNOSIS — I10 Essential (primary) hypertension: Secondary | ICD-10-CM | POA: Diagnosis not present

## 2023-06-19 NOTE — Discharge Instructions (Signed)
The x-ray of your hand did not show any broken bones.  You have an injury to the base of your left thumb.  I recommend that you elevate your hand and apply ice packs on and off when possible.  You may take Tylenol if needed for discomfort.  CT scan of your head is reassuring.  You may experience intermittent headaches with nausea and/or dizziness secondary to your head injury.  Please follow-up with your primary care provider next week for recheck.  Return to the emergency department for any new or worsening symptoms.

## 2023-06-19 NOTE — ED Provider Notes (Signed)
Accepted handoff at shift change from [facility]. Please see prior provider note for more detail.   Briefly: Patient is 77 y.o.   DDX: concern for fall, head injury.  Plan: Patient overall well-appearing with no focal neurologic deficits, does not take a blood thinner.  He is pending CT head at time of handoff.  I independently interpreted CT head without contrast which shows no evidence of acute intracranial abnormality.  I agree with radiologist interpretation. , Encouraged ice, Tylenol for pain control, patient encouraged to follow-up with PCP as needed for recheck.      RISR  EDTHIS    Olene Floss, PA-C 06/19/23 1959    Terrilee Files, MD 06/20/23 1009

## 2023-06-19 NOTE — ED Provider Notes (Signed)
EMERGENCY DEPARTMENT AT [facility] Provider Note   CSN: [id number] Arrival date & time: 06/19/23  1609     History  Chief Complaint  Patient presents with   Derek Drake is a 77 y.o. male.   Fall Pertinent negatives include no chest pain, no abdominal pain, no headaches and no shortness of breath.       Derek Drake is a 77 y.o. male with past medical history of paroxysmal atrial fibrillation, type 2 diabetes, hypertension, previously anticoagulated, was discontinued due to recurrent GI bleeding earlier this year, who presents to the Emergency Department complaining of left hand pain, abrasion forearm and head injury.  States that he was using a weedeater, slipped and fell down a hill.  He denies loss of consciousness.  He has pain with movement of his left thumb and initially had headache with pain to the top of his scalp.  He denies any headache, visual changes, dizziness nausea or vomiting on arrival.  Home Medications Prior to Admission medications   Medication Sig Start Date End Date Taking? Authorizing Provider  cholecalciferol (VITAMIN D3) 25 MCG (1000 UNIT) tablet Take 1,000 Units by mouth daily.    [provider]  clotrimazole-betamethasone (LOTRISONE) cream Apply topically 2 (two) times daily. 04/24/23   [provider]  [employer] IRON 325 (65 Fe) MG tablet Take 325 mg by mouth 2 (two) times daily. 06/17/22   [provider]  diltiazem (CARDIZEM CD) 360 MG 24 hr capsule Take 360 mg by mouth daily.     [provider]  furosemide (LASIX) 20 MG tablet Take 20-40 mg by mouth daily as needed. 06/11/23   [provider]  gabapentin (NEURONTIN) 100 MG capsule Take 100 mg by mouth at bedtime. 07/07/19   [provider]  glimepiride (AMARYL) 2 MG tablet Take 2 mg by mouth daily. 05/21/23   [provider]  Boris Lown Oil 500 MG CAPS Take 500 mg by mouth daily.    [provider]  losartan  (COZAAR) 100 MG tablet Take 100 mg by mouth daily. 06/17/22   [provider]  milk thistle 175 MG tablet Take 175 mg by mouth daily.    [provider]  Multiple Vitamin (MULTIVITAMIN WITH MINERALS) TABS tablet Take 1 tablet by mouth daily.    [provider]  nadolol (CORGARD) 20 MG tablet TAKE 1 TABLET BY MOUTH EVERY DAY 01/08/23   Rourk, Gerrit Friends, MD  pantoprazole (PROTONIX) 40 MG tablet TAKE 1 TABLET BY MOUTH DAILY BEFORE BREAKFAST 05/11/23   Tiffany Kocher, PA-C  simvastatin (ZOCOR) 40 MG tablet Take 40 mg by mouth every evening.     [provider]  tamsulosin (FLOMAX) 0.4 MG CAPS capsule Take 0.4 mg by mouth daily. 04/24/23   [provider]      Allergies    Rabbit protein, Hydrocodone-acetaminophen, Other, and Ivp dye [iodinated contrast media]    Review of Systems   Review of Systems  Constitutional:  Negative for appetite change, chills and fever.  Eyes:  Negative for visual disturbance.  Respiratory:  Negative for shortness of breath.   Cardiovascular:  Negative for chest pain.  Gastrointestinal:  Negative for abdominal pain, nausea and vomiting.  Genitourinary:  Negative for dysuria and flank pain.  Musculoskeletal:  Positive for arthralgias (Left hand pain, abrasion left forearm). Negative for neck pain.  Skin:  Negative for wound.  Neurological:  Negative for dizziness, syncope, weakness, numbness  and headaches.  Psychiatric/Behavioral:  Negative for confusion.     Physical Exam Updated Vital Signs BP (!) 115/50 (BP Location: Right Arm)   Pulse 62   Temp 97.8 F (36.6 C) (Oral)   Resp 16   Ht 5\' 11"  (1.803 m)   Wt 80.3 kg   SpO2 98%   BMI 24.69 kg/m  Physical Exam Vitals and nursing note reviewed.  Constitutional:      General: He is not in acute distress.    Appearance: Normal appearance. He is not toxic-appearing.  HENT:     Head:     Comments: Mild tenderness with small hematoma of the parietal scalp.  No open  wound or bleeding Eyes:     Extraocular Movements: Extraocular movements intact.     Conjunctiva/sclera: Conjunctivae normal.     Pupils: Pupils are equal, round, and reactive to light.  Cardiovascular:     Rate and Rhythm: Normal rate and regular rhythm.     Pulses: Normal pulses.  Pulmonary:     Effort: Pulmonary effort is normal.  Musculoskeletal:        General: Tenderness and signs of injury present.     Cervical back: Full passive range of motion without pain and normal range of motion. No rigidity.     Comments: Soft tissue swelling and ecchymosis at the thenar eminence of the left thumb.  Has full range of motion of the thumb, normal finger to thumb opposition, left wrist nontender.  Abrasion of the left forearm without active bleeding or laceration.  Left elbow nontender  Skin:    General: Skin is warm.     Capillary Refill: Capillary refill takes less than 2 seconds.  Neurological:     General: No focal deficit present.     Mental Status: He is alert.     Sensory: No sensory deficit.     Motor: No weakness.     ED Results / Procedures / Treatments   Labs (all labs ordered are listed, but only abnormal results are displayed) Labs Reviewed - No data to display  EKG None  Radiology DG Hand Complete Left  Result Date: 06/19/2023 CLINICAL DATA:  Left hand bruising and pain after fall. EXAM: LEFT HAND - COMPLETE 3+ VIEW COMPARISON:  Left third finger radiographs 11/08/2004 FINDINGS: Interval healing of the prior soft tissue amputation of the far distal aspect of the third finger and nail. No significant bone loss is seen at the distal aspect of the third finger distal phalanx. Moderate to severe thumb interphalangeal, moderate second and fifth, and mild-to-moderate third and fourth DIP joint space narrowing and peripheral osteophytosis. Moderate right second through fifth PIP joint space narrowing and peripheral osteophytosis. Severe thumb carpometacarpal joint space  narrowing, subchondral sclerosis/cystic change, and peripheral osteophytosis. Mild distal lateral scaphoid degenerative spurring. Neutral ulnar variance. No acute fracture is seen.  No dislocation. IMPRESSION: 1. No acute fracture. 2. Interval healing of the prior soft tissue amputation of the far distal aspect of the third finger and nail on remote 2006 radiographs. 3. Moderate to severe thumb interphalangeal, moderate second and fifth, and mild-to-moderate third and fourth DIP osteoarthritis. 4. Severe thumb carpometacarpal osteoarthritis. Electronically Signed   By: Neita Garnet M.D.   On: 06/19/2023 18:10    Procedures Procedures    Medications Ordered in ED Medications - No data to display  ED Course/ Medical Decision Making/ A&P  Medical Decision Making Patient here for evaluation of injury sustained in mechanical fall earlier today.  Does endorse head injury without LOC.  Headache initially but has since resolved.  He does have pain and swelling of his left thumb.  Neurovascularly intact.  No lacerations  I suspect pain versus fracture of the thumb.  No obvious instability on exam.  Small hematoma to the parietal scalp.  Patient previously on blood thinners but has not taken since earlier this year.  Denies any headache dizziness or visual changes at this time.  I suspect minor head injury, subdural, intracranial bleeding considered  Amount and/or Complexity of Data Reviewed Radiology: ordered.    Details: X-ray of the left hand without acute fracture  CT head results pending Discussion of management or test interpretation with external provider(s): Bulky dressing applied to the left thumb with improvement of patient's symptoms.  Agreeable to symptomatic treatment.  I suspect CT head will be reassuring.  Discussed with Luther Hearing, PA-C who will review CT results prior to patient's disposition           Final Clinical Impression(s) /  ED Diagnoses Final diagnoses:  Fall, initial encounter  Injury of left thumb, initial encounter  Minor head injury, initial encounter    Rx / DC Orders ED Discharge Orders     None         Rosey Bath 06/19/23 1935    Terrilee Files, MD 06/20/23 1009

## 2023-06-19 NOTE — ED Triage Notes (Signed)
Pt fell down a hill today while weedeating on gravel road.  Pt hit back of head, denies LOC.  Pt denies blood thinners.  Pt with abrasions to left forearm and swelling to left palm.

## 2023-06-19 NOTE — ED Notes (Signed)
Dc instructions reviewed with pt no questions or concerns at this time.  

## 2023-06-22 ENCOUNTER — Encounter: Payer: Self-pay | Admitting: Cardiology

## 2023-06-22 ENCOUNTER — Ambulatory Visit: Payer: Medicare Other | Attending: Cardiology | Admitting: Cardiology

## 2023-06-22 VITALS — BP 120/70 | HR 64 | Ht 71.0 in | Wt 180.2 lb

## 2023-06-22 DIAGNOSIS — I480 Paroxysmal atrial fibrillation: Secondary | ICD-10-CM | POA: Diagnosis present

## 2023-06-22 DIAGNOSIS — E782 Mixed hyperlipidemia: Secondary | ICD-10-CM | POA: Diagnosis present

## 2023-06-22 DIAGNOSIS — I10 Essential (primary) hypertension: Secondary | ICD-10-CM | POA: Diagnosis present

## 2023-06-22 NOTE — Progress Notes (Signed)
    Cardiology Office Note  Date: 06/22/2023   ID: Derek, Drake June 27, 1946, MRN [id number]  History of Present Illness: Derek Drake is a 77 y.o. male last seen in April.  He is here for a routine visit.  Reports no interval palpitations, no exertional chest pain.  Typically NYHA class II dyspnea with ADLs and outside work.  I reviewed his medications.  He continues on Cardizem CD, Cozaar, Corgard, and Zocor from a cardiovascular perspective.  He also uses Lasix as needed.  No recent leg edema.  I went over his most recent lab work.  LDL 51.  He remains anemic with hemoglobin 9.3, denies any stool changes.  Still on iron supplements.  Mentions that his appetite is better since stopping metformin per PCP.  Physical Exam: VS:  BP 120/70   Pulse 64   Ht 5\' 11"  (1.803 m)   Wt 180 lb 3.2 oz (81.7 kg)   SpO2 98%   BMI 25.13 kg/m , BMI Body mass index is 25.13 kg/m.  Wt Readings from Last 3 Encounters:  06/22/23 180 lb 3.2 oz (81.7 kg)  06/19/23 177 lb (80.3 kg)  06/16/23 180 lb 6.4 oz (81.8 kg)    General: Patient appears comfortable at rest. HEENT: Conjunctiva and lids normal. Neck: Supple, no elevated JVP or carotid bruits. Lungs: Clear to auscultation, nonlabored breathing at rest. Cardiac: Regular rate and rhythm, no S3 or significant systolic murmur. Extremities: No pitting edema.  ECG:  An ECG dated 12/17/2022 was personally reviewed today and demonstrated:  Sinus rhythm with increased voltage.  Labwork: 02/04/2023: ALT 22; AST 27; BUN 18; Creat 1.04; Potassium 4.6; Sodium 137 04/29/2023: Hemoglobin 9.0; Platelets 79  August 2024: Cholesterol 104, triglycerides 140, HDL 28, LDL 51, TSH 4.30 May 2023: Hemoglobin 9.3, platelets 73, WBC 2.6, BUN 16, creatinine 1.13, potassium 4.3, AST 25, ALT 18  Other Studies Reviewed Today:  No interval cardiac testing for review today.  Assessment and Plan:  1.  Paroxysmal atrial fibrillation with CHA2DS2-VASc score of 4.   Anticoagulation has been discontinued in light of recurrent GI bleeding with documented AVMs requiring PRBC transfusion.  He is asymptomatic in terms of palpitations and heart rate is regular today.  Continue Cardizem CD and Corgard.   2.  Primary hypertension.  Currently on losartan and Cardizem CD.  Blood pressure is well-controlled today.  No changes were made.   3.  Mixed hyperlipidemia.  LDL 51 in August.  He continues on Zocor.  Disposition:  Follow up  6 months.  Signed, Jonelle Sidle, M.D., F.A.C.C. [facility]

## 2023-06-22 NOTE — Patient Instructions (Signed)

## 2023-06-25 ENCOUNTER — Other Ambulatory Visit: Payer: Self-pay

## 2023-06-25 DIAGNOSIS — D500 Iron deficiency anemia secondary to blood loss (chronic): Secondary | ICD-10-CM

## 2023-06-26 ENCOUNTER — Inpatient Hospital Stay: Payer: Medicare Other | Attending: Hematology

## 2023-06-26 DIAGNOSIS — D500 Iron deficiency anemia secondary to blood loss (chronic): Secondary | ICD-10-CM | POA: Insufficient documentation

## 2023-06-26 DIAGNOSIS — D696 Thrombocytopenia, unspecified: Secondary | ICD-10-CM | POA: Diagnosis present

## 2023-06-26 DIAGNOSIS — K922 Gastrointestinal hemorrhage, unspecified: Secondary | ICD-10-CM | POA: Diagnosis not present

## 2023-06-26 LAB — FERRITIN: Ferritin: 37 ng/mL (ref 24–336)

## 2023-07-01 NOTE — Telephone Encounter (Signed)
Noted. Addressed by Dr. Jena Gauss at 06/16/23 ov.

## 2023-07-03 ENCOUNTER — Inpatient Hospital Stay (HOSPITAL_BASED_OUTPATIENT_CLINIC_OR_DEPARTMENT_OTHER): Payer: Medicare Other | Admitting: Oncology

## 2023-07-03 VITALS — BP 113/56 | HR 64 | Temp 97.5°F | Resp 18 | Ht 71.0 in | Wt 184.0 lb

## 2023-07-03 DIAGNOSIS — D696 Thrombocytopenia, unspecified: Secondary | ICD-10-CM | POA: Diagnosis not present

## 2023-07-03 DIAGNOSIS — D649 Anemia, unspecified: Secondary | ICD-10-CM

## 2023-07-03 NOTE — Progress Notes (Signed)
[facility] [address], [place] [address]   Clinic Day:  07/03/2023  Referring physician: Elfredia Nevins, MD  Patient Care Team: Elfredia Nevins, MD as PCP - General (Internal Medicine) Jonelle Sidle, MD as PCP - Cardiology (Cardiology) Jonelle Sidle, MD as Consulting Physician (Cardiology) Jena Gauss Gerrit Friends, MD as Consulting Physician (Gastroenterology) Doreatha Massed, MD as Medical Oncologist (Hematology)   ASSESSMENT & PLAN:   Assessment:  1.  Thrombocytopenia: - Patient seen at the request of Dr. Sherwood Gambler - 01/29/2023: PLT-88, B12 and folic acid normal - 04/24/2022: Platelets 99 - CTAP (02/03/2023): Splenomegaly 18 cm, liver cirrhosis, portal hypertension  2.  Normocytic anemia: - 01/28/2013: Hb-9.2, MCV-90, WBC-3.2, creatinine-1.06 - He has history of iron deficiency anemia secondary to recurrent GI bleed from small bowel/colonic AVMs.  History of esophageal varices without prior bleeding on beta-blocker. - Colonoscopy (04/14/2022): Nonbleeding internal hemorrhoids, single ulcer in the cecum, 5 nonbleeding colonic angiodysplastic lesions treated with APC. - EGD (01/23/2022): Columns of grade 1-2 varices, portal gastropathy, normal duodenum - He has been taking iron tablet 1-2 times daily for almost a year.  He had to have blood transfusion last year.  No ice pica.  No previous parenteral iron therapy.  3.  Social/family history: - Lives at home with his wife.  Independent of ADLs and IADLs.  Seen today with his daughter.  He is a retired Naval architect.  Quit smoking more than 20 years ago. - Half brother had prostate cancer.  Half sister had breast cancer.  Plan:  1.  Moderate thrombocytopenia: - Platelet count has ranged from 77-125 over the past 8 to 9 years. -Etiology felt to be secondary to splenomegaly and liver disease. -Will continue to monitor labs every few months. -Platelet count from Sept 2024 was 73,000.   2.  Normocytic  anemia: - Hemoglobin has fluctuated over the years with counts as low as 6.5- normal.  -Most recently he has been between 8-9.  -Previous workup did not reveal any underlying blood dyscrasia or hemolytic anemia. -No evidence of active bleed per patient, although suspect slow GI bleed from known AVMs.  He is followed closely by GI. -He was given 1 g of Monoferric on 03/27/2023 and again on 05/15/2023. -He is currently taking iron supplements twice daily with good tolerance. -Lab work from today shows a ferritin of 37, iron saturation 24% and hemoglobin 9.3.  Platelet count is 73,000. -Continue oral iron and recommend additional IV iron in the next month or so. -Follow-up in clinic in 2-3 months.  PLAN SUMMARY: >> Plan for 1 additional dose of IV iron Monoferric 1 g in the next 2-3 weeks.  >> RTC 8-12 weeks later for follow-up with labs a few days before.    No orders of the defined types were placed in this encounter.  I spent 25 minutes dedicated to the care of this patient (face-to-face and non-face-to-face) on the date of the encounter to include what is described in the assessment and plan.  Mauro Kaufmann, NP   10/18/202411:54 AM  CHIEF COMPLAINT/PURPOSE OF CONSULT:   Diagnosis: thrombocytopenia   Current Therapy: Under workup  HISTORY OF PRESENT ILLNESS:   Clabe is a 77 y.o. male presenting to clinic today to review recent lab work.  He was sent over at the request of Dr. Sherwood Gambler.  He was last seen in clinic by me on 04/15/2023.  Reports he feels the best he is felt in years.  His appetite  has improved after he switched from metformin to glimepiride.  His weight is up 4 pounds.  He denies any active bleeding.  He has follow-up with Dr. Jena Gauss in the next few weeks.  Over the past few years, his platelet count continues to decline with fluctuation between 75,000-130,000 since 2015.   He has known anemia from upper GI blood loss and is followed by [facility].  He was taken off both  Eliquis and aspirin in 2023 due to this.  He received 1 dose of 1 g Monoferric on 03/27/2023 and again on 05/15/2023.  Repeat labs a few weeks later did not show significant improvement of his hemoglobin or iron levels.  He returns today, to recheck labs.  Denies any bleeding.  Reports he has been drinking 4 to 5 glasses of water each day and feels much better.  His energy levels are 100% appetite is 80%.  He denies any pain.   PAST MEDICAL HISTORY:   Past Medical History: Past Medical History:  Diagnosis Date   Cirrhosis (HCC)    Immune to Hep A. Received Hep B vaccination in 2022.   Essential hypertension    GERD (gastroesophageal reflux disease)    Hypercholesteremia    PAF (paroxysmal atrial fibrillation) (HCC) 05/04/2014   Type 2 diabetes mellitus [facility])     Surgical History: Past Surgical History:  Procedure Laterality Date   BIOPSY  02/05/2017   Procedure: BIOPSY;  Surgeon: Corbin Ade, MD;  [facility];  Service: Endoscopy;;  gastric colon   CHOLECYSTECTOMY     COLONOSCOPY N/A 05/13/2013   Procedure: COLONOSCOPY;  Surgeon: Corbin Ade, MD;  [facility];  Service: Endoscopy;  Laterality: N/A;  8:30 AM   COLONOSCOPY N/A 02/05/2017   Procedure: COLONOSCOPY;  Surgeon: Corbin Ade, MD;  [facility];  Service: Endoscopy;  Laterality: N/A;   COLONOSCOPY WITH PROPOFOL N/A 03/26/2022   Procedure: COLONOSCOPY WITH PROPOFOL;  Surgeon: Corbin Ade, MD;  [facility];  Service: Endoscopy;  Laterality: N/A;  7:30am, moved to 7/12 @ 2:45   COLONOSCOPY WITH PROPOFOL N/A 04/14/2022   Procedure: COLONOSCOPY WITH PROPOFOL;  Surgeon: Lanelle Bal, DO;  [facility];  Service: Endoscopy;  Laterality: N/A;   ESOPHAGOGASTRODUODENOSCOPY N/A 02/05/2017   Procedure: ESOPHAGOGASTRODUODENOSCOPY (EGD);  Surgeon: Corbin Ade, MD;  [facility];  Service: Endoscopy;  Laterality: N/A;  730 - moved to 12:00     ESOPHAGOGASTRODUODENOSCOPY N/A 07/04/2020   Procedure: ESOPHAGOGASTRODUODENOSCOPY (EGD);  Surgeon: Corbin Ade, MD;  [facility];  Service: Endoscopy;  Laterality: N/A;  12:45pm   ESOPHAGOGASTRODUODENOSCOPY (EGD) WITH PROPOFOL N/A 01/23/2022   3 columns of grade 1-2 varices, portal gastropathy, normal duodenum. Begin Nadolol 20 mg daily.   GIVENS CAPSULE STUDY N/A 07/04/2020   Procedure: GIVENS CAPSULE STUDY;  Surgeon: Corbin Ade, MD;  [facility];  Service: Endoscopy;  Laterality: N/A;  Elected not to place a capsule today   GIVENS CAPSULE STUDY N/A 04/12/2022   Procedure: GIVENS CAPSULE STUDY;  Surgeon: Dolores Frame, MD;  [facility];  Service: Gastroenterology;  Laterality: N/A;   HOT HEMOSTASIS  03/26/2022   Procedure: HOT HEMOSTASIS (ARGON PLASMA COAGULATION/BICAP);  Surgeon: Corbin Ade, MD;  [facility];  Service: Endoscopy;;   HOT HEMOSTASIS  04/14/2022   Procedure: HOT HEMOSTASIS (ARGON PLASMA COAGULATION/BICAP);  Surgeon: Lanelle Bal, DO;  [facility];  Service: Endoscopy;;   POLYPECTOMY  02/05/2017   Procedure: POLYPECTOMY;  Surgeon: Corbin Ade, MD;  [facility];  Service: Endoscopy;;  colon    Social History: Social History   Socioeconomic History   Marital status: Married    Spouse name: Not on file   Number of children: Not on file   Years of education: Not on file   Highest education level: Not on file  Occupational History   Not on file  Tobacco Use   Smoking status: Former    Current packs/day: 0.00    Average packs/day: 2.0 packs/day for 53.0 years (106.0 ttl pk-yrs)    Types: Cigarettes    Start date: 05/25/1956    Quit date: 05/25/2009    Years since quitting: 14.1   Smokeless tobacco: Current    Types: Chew  Vaping Use   Vaping status: Never Used  Substance and Sexual Activity   Alcohol use: Not Currently   Drug use: No   Sexual activity: Yes  Other  Topics Concern   Not on file  Social History Narrative   Not on file   Social Determinants of Health   Financial Resource Strain: Low Risk  (05/07/2022)   Received from [facility], [facility]   Overall Financial Resource Strain (CARDIA)    Difficulty of Paying Living Expenses: Not hard at all  Food Insecurity: No Food Insecurity (05/07/2022)   Received from [facility], [facility]   Hunger Vital Sign    Worried About Running Out of Food in the Last Year: Never true    Ran Out of Food in the Last Year: Never true  Transportation Needs: No Transportation Needs (05/07/2022)   Received from [facility], [facility]   [facility] - Transportation    Lack of Transportation (Medical): No    Lack of Transportation (Non-Medical): No  Physical Activity: Inactive (05/07/2022)   Received from [facility], [facility]   Exercise Vital Sign    Days of Exercise per Week: 0 days    Minutes of Exercise per Session: 0 min  Stress: No Stress Concern Present (05/07/2022)   Received from [facility], [facility] of Occupational Health - Occupational Stress Questionnaire    Feeling of Stress : Not at all  Social Connections: Socially Integrated (05/07/2022)   Received from [facility], [facility]   Social Connection and Isolation Panel [NHANES]    Frequency of Communication with Friends and Family: More than three times a week    Frequency of Social Gatherings with Friends and Family: More than three times a week    Attends Religious Services: More than 4 times per year    Active Member of [employer] or Organizations: Yes    Attends Engineer, structural: More than 4 times per year    Marital Status: Married  Catering manager Violence: Not At Risk (05/07/2022)   Received from [facility], [facility]   Humiliation, Afraid, Rape, and Kick questionnaire    Fear of Current or Ex-Partner: No    Emotionally Abused: No     Physically Abused: No    Sexually Abused: No    Family History: Family History  Problem Relation Age of Onset   Prostate cancer Brother    Congestive Heart Failure Mother    Heart failure Brother    Colon cancer Neg Hx     Current Medications:  Current Outpatient Medications:    cholecalciferol (VITAMIN D3) 25 MCG (1000 UNIT) tablet, Take 1,000 Units by mouth daily., Disp: , Rfl:    clotrimazole-betamethasone (LOTRISONE) cream, Apply topically 2 (two) times daily., Disp: , Rfl:    [employer] IRON 325 (65 Fe) MG tablet, Take 325 mg by mouth 2 (two) times daily., Disp: , Rfl:    diltiazem (CARDIZEM CD) 360 MG 24 hr capsule, Take 360 mg by mouth daily. , Disp: , Rfl:    furosemide (LASIX) 20 MG tablet, Take 20-40 mg by mouth daily as needed., Disp: , Rfl:    gabapentin (NEURONTIN) 100 MG capsule, Take 100 mg by mouth at bedtime., Disp: , Rfl:    glimepiride (AMARYL) 2 MG tablet, Take 2 mg by mouth daily., Disp: , Rfl:    Krill Oil 500 MG CAPS, Take 500 mg by mouth daily., Disp: , Rfl:    losartan (COZAAR) 100 MG tablet, Take 100 mg by mouth daily., Disp: , Rfl:    milk thistle 175 MG tablet, Take 175 mg by mouth daily., Disp: , Rfl:    Multiple Vitamin (MULTIVITAMIN WITH MINERALS) TABS tablet, Take 1 tablet by mouth daily., Disp: , Rfl:    nadolol (CORGARD) 20 MG tablet, TAKE 1 TABLET BY MOUTH EVERY DAY, Disp: 90 tablet, Rfl: 3   pantoprazole (PROTONIX) 40 MG tablet, TAKE 1 TABLET BY MOUTH DAILY BEFORE BREAKFAST, Disp: 90 tablet, Rfl: 3   simvastatin (ZOCOR) 40 MG tablet, Take 40 mg by mouth every evening. , Disp: , Rfl:    tamsulosin (FLOMAX) 0.4 MG CAPS capsule, Take 0.4 mg by mouth daily., Disp: , Rfl:    Allergies: Allergies  Allergen Reactions   Rabbit Protein Swelling    Face swelled up   Hydrocodone-Acetaminophen Rash   Other Swelling and Rash    Dial soap   Ivp Dye [Iodinated Contrast Media] Other (See Comments)    Possible allergy... patient can not confirm or deny contrast  allergy, needs pre meds pre Dr. Si Gaul (Madylyn Insco Excell Seltzer 01/27/2023)    REVIEW OF SYSTEMS:   Review of Systems  Constitutional:  Negative for fatigue.  All other systems reviewed and are negative.    VITALS:   Blood pressure (!) 113/56, pulse 64, temperature (!) 97.5 F (36.4 C), temperature source Oral, resp. rate 18, height 5\' 11"  (1.803 m), weight 184 lb (83.5 kg), SpO2 100%.  Wt Readings from Last 3 Encounters:  07/03/23 184 lb (83.5 kg)  06/22/23 180 lb 3.2 oz (81.7 kg)  06/19/23 177 lb (80.3 kg)    Body mass index is 25.66 kg/m.   PHYSICAL EXAM:   Physical Exam Constitutional:      Appearance: Normal appearance.  Cardiovascular:     Rate and Rhythm: Normal rate and regular rhythm.  Pulmonary:     Effort: Pulmonary effort is normal.     Breath sounds: Normal breath sounds.  Abdominal:     General: Bowel sounds are normal.     Palpations: Abdomen is soft.  Musculoskeletal:        General: No swelling. Normal range of motion.  Neurological:     Mental Status: He is alert and oriented to person, place, and time. Mental status is at baseline.     LABS:      Latest Ref Rng & Units 04/29/2023   11:09 AM 04/15/2023    8:25 AM 02/20/2023    8:41 AM  CBC  WBC 4.0 - 10.5 K/uL 3.3  3.1  3.3  Hemoglobin 13.0 - 17.0 g/dL 9.0  8.8  9.5   Hematocrit 39.0 - 52.0 % 27.4  27.1  28.5   Platelets 150 - 400 K/uL 79  78  80       Latest Ref Rng & Units 03/02/2023    9:38 AM 02/04/2023   10:53 AM 02/03/2023    9:27 AM  CMP  Glucose 65 - 139 mg/dL  409    BUN 7 - 25 mg/dL  18    Creatinine 8.11 - 1.28 mg/dL  9.14  7.82   Sodium 956 - 146 mmol/L  137    Potassium 3.5 - 5.3 mmol/L  4.6    Chloride 98 - 110 mmol/L  101    CO2 20 - 32 mmol/L  25    Calcium 8.6 - 10.2 mg/dL 9.4  21.3    Total Protein 6.1 - 8.1 g/dL  6.2    Total Bilirubin 0.2 - 1.2 mg/dL  0.4    AST 10 - 35 U/L  27    ALT 9 - 46 U/L  22       No results found for: "CEA1", "CEA" / No results found for: "CEA1",  "CEA" No results found for: "PSA1" No results found for: "CAN199" No results found for: "CAN125"  Lab Results  Component Value Date   TOTALPROTELP 6.2 02/20/2023   TOTALPROTELP 6.1 02/20/2023   ALBUMINELP 3.6 02/20/2023   A1GS 0.2 02/20/2023   A2GS 0.8 02/20/2023   BETS 1.0 02/20/2023   GAMS 0.5 02/20/2023   MSPIKE Not Observed 02/20/2023   SPEI Comment 02/20/2023   Lab Results  Component Value Date   TIBC 380 04/29/2023   TIBC 347 04/15/2023   TIBC 495 (H) 02/20/2023   FERRITIN 37 06/26/2023   FERRITIN 36 04/29/2023   FERRITIN 79 04/15/2023   IRONPCTSAT 24 04/29/2023   IRONPCTSAT 23 04/15/2023   IRONPCTSAT 21 02/20/2023   Lab Results  Component Value Date   LDH 128 02/20/2023     STUDIES:   CT Head Wo Contrast  Result Date: 06/19/2023 CLINICAL DATA:  Larey Seat down a hill today while weedeating and hit back of head. Abrasions to left forearm and hand EXAM: CT HEAD WITHOUT CONTRAST TECHNIQUE: Contiguous axial images were obtained from the base of the skull through the vertex without intravenous contrast. RADIATION DOSE REDUCTION: This exam was performed according to the departmental dose-optimization program which includes automated exposure control, adjustment of the mA and/or kV according to patient size and/or use of iterative reconstruction technique. COMPARISON:  Maxillofacial CT 08/29/2005 FINDINGS: Brain: No intracranial hemorrhage, mass effect, or evidence of acute infarct. No hydrocephalus. No extra-axial fluid collection. Vascular: No hyperdense vessel or unexpected calcification. Skull: No fracture or focal lesion. Sinuses/Orbits: No acute finding. Other: None. IMPRESSION: No acute intracranial abnormality. Electronically Signed   By: Minerva Fester M.D.   On: 06/19/2023 19:49   DG Hand Complete Left  Result Date: 06/19/2023 CLINICAL DATA:  Left hand bruising and pain after fall. EXAM: LEFT HAND - COMPLETE 3+ VIEW COMPARISON:  Left third finger radiographs 11/08/2004  FINDINGS: Interval healing of the prior soft tissue amputation of the far distal aspect of the third finger and nail. No significant bone loss is seen at the distal aspect of the third finger distal phalanx. Moderate to severe thumb interphalangeal, moderate second and fifth, and mild-to-moderate third and fourth DIP joint space narrowing and peripheral osteophytosis. Moderate right second through fifth PIP joint space narrowing and peripheral osteophytosis. Severe  thumb carpometacarpal joint space narrowing, subchondral sclerosis/cystic change, and peripheral osteophytosis. Mild distal lateral scaphoid degenerative spurring. Neutral ulnar variance. No acute fracture is seen.  No dislocation. IMPRESSION: 1. No acute fracture. 2. Interval healing of the prior soft tissue amputation of the far distal aspect of the third finger and nail on remote 2006 radiographs. 3. Moderate to severe thumb interphalangeal, moderate second and fifth, and mild-to-moderate third and fourth DIP osteoarthritis. 4. Severe thumb carpometacarpal osteoarthritis. Electronically Signed   By: Neita Garnet M.D.   On: 06/19/2023 18:10

## 2023-07-10 ENCOUNTER — Ambulatory Visit: Payer: Medicare Other | Admitting: Internal Medicine

## 2023-07-14 ENCOUNTER — Encounter: Payer: Self-pay | Admitting: Internal Medicine

## 2023-07-20 ENCOUNTER — Inpatient Hospital Stay: Payer: Medicare Other

## 2023-07-22 ENCOUNTER — Inpatient Hospital Stay: Payer: Medicare Other | Attending: Hematology

## 2023-07-22 VITALS — BP 104/53 | HR 58 | Temp 96.7°F | Resp 18

## 2023-07-22 DIAGNOSIS — K922 Gastrointestinal hemorrhage, unspecified: Secondary | ICD-10-CM | POA: Insufficient documentation

## 2023-07-22 DIAGNOSIS — D696 Thrombocytopenia, unspecified: Secondary | ICD-10-CM | POA: Diagnosis present

## 2023-07-22 DIAGNOSIS — D500 Iron deficiency anemia secondary to blood loss (chronic): Secondary | ICD-10-CM | POA: Insufficient documentation

## 2023-07-22 MED ORDER — CETIRIZINE HCL 10 MG PO TABS
10.0000 mg | ORAL_TABLET | Freq: Once | ORAL | Status: AC
  Administered 2023-07-22: 10 mg via ORAL
  Filled 2023-07-22: qty 1

## 2023-07-22 MED ORDER — FAMOTIDINE IN NACL 20-0.9 MG/50ML-% IV SOLN
20.0000 mg | Freq: Once | INTRAVENOUS | Status: AC
  Administered 2023-07-22: 20 mg via INTRAVENOUS
  Filled 2023-07-22: qty 50

## 2023-07-22 MED ORDER — ACETAMINOPHEN 325 MG PO TABS
650.0000 mg | ORAL_TABLET | Freq: Once | ORAL | Status: AC
  Administered 2023-07-22: 650 mg via ORAL
  Filled 2023-07-22: qty 2

## 2023-07-22 MED ORDER — SODIUM CHLORIDE 0.9 % IV SOLN: Freq: Once | INTRAVENOUS | Status: AC

## 2023-07-22 MED ORDER — SODIUM CHLORIDE 0.9 % IV SOLN
1000.0000 mg | Freq: Once | INTRAVENOUS | Status: AC
  Administered 2023-07-22: 1000 mg via INTRAVENOUS
  Filled 2023-07-22: qty 10

## 2023-07-22 NOTE — Patient Instructions (Signed)
[facility] - A DEPT OF [facility]  Discharge Instructions: Thank you for choosing [facility] to provide your oncology and hematology care.  If you have a lab appointment with the Cancer Center - please note that after April 8th, 2024, all labs will be drawn in the cancer center.  You do not have to check in or register with the main entrance as you have in the past but will complete your check-in in the cancer center.  Wear comfortable clothing and clothing appropriate for easy access to any Portacath or PICC line.   We strive to give you quality time with your provider. You may need to reschedule your appointment if you arrive late (15 or more minutes).  Arriving late affects you and other patients whose appointments are after yours.  Also, if you miss three or more appointments without notifying the office, you may be dismissed from the clinic at the provider's discretion.      For prescription refill requests, have your pharmacy contact our office and allow 72 hours for refills to be completed.    Today you received the following chemotherapy and/or immunotherapy agents monoferric      To help prevent nausea and vomiting after your treatment, we encourage you to take your nausea medication as directed.  BELOW ARE SYMPTOMS THAT SHOULD BE REPORTED IMMEDIATELY: *FEVER GREATER THAN 100.4 F (38 C) OR HIGHER *CHILLS OR SWEATING *NAUSEA AND VOMITING THAT IS NOT CONTROLLED WITH YOUR NAUSEA MEDICATION *UNUSUAL SHORTNESS OF BREATH *UNUSUAL BRUISING OR BLEEDING *URINARY PROBLEMS (pain or burning when urinating, or frequent urination) *BOWEL PROBLEMS (unusual diarrhea, constipation, pain near the anus) TENDERNESS IN MOUTH AND THROAT WITH OR WITHOUT PRESENCE OF ULCERS (sore throat, sores in mouth, or a toothache) UNUSUAL RASH, SWELLING OR PAIN  UNUSUAL VAGINAL DISCHARGE OR ITCHING   Items with * indicate a potential emergency and should be followed  up as soon as possible or go to the Emergency Department if any problems should occur.  Please show the CHEMOTHERAPY ALERT CARD or IMMUNOTHERAPY ALERT CARD at check-in to the Emergency Department and triage nurse.  Should you have questions after your visit or need to cancel or reschedule your appointment, please contact [facility] - A DEPT OF [facility]igh[facility] [phone]  and follow the prompts.  Office hours are 8:00 a.m. to 4:30 p.m. Monday - Friday. Please note that voicemails left after 4:00 p.m. may not be returned until the following business day.  We are closed weekends and major holidays. You have access to a nurse at all times for urgent questions. Please call the main number to the clinic [phone] and follow the prompts.  For any non-urgent questions, you may also contact your provider using MyChart. We now offer e-Visits for anyone 18 and older to request care online for non-urgent symptoms. For details visit [email or web address].   Also download the MyChart app! Go to the app store, search "MyChart", open the app, select , and log in with your MyChart username and password.

## 2023-07-22 NOTE — Progress Notes (Signed)
Patient presents today for Monoferric infusion per providers order.  Vital signs WNL.  Patient has no new complaints at this time.    Peripheral IV started and blood return noted pre and post infusion.  Stable during infusion without adverse affects.  Vital signs stable.  No complaints at this time.  Discharge from clinic ambulatory in stable condition.  Alert and oriented X 3.  Follow up with [facility] as scheduled.

## 2023-10-01 ENCOUNTER — Other Ambulatory Visit: Payer: Self-pay

## 2023-10-01 DIAGNOSIS — D500 Iron deficiency anemia secondary to blood loss (chronic): Secondary | ICD-10-CM

## 2023-10-01 DIAGNOSIS — D649 Anemia, unspecified: Secondary | ICD-10-CM

## 2023-10-02 ENCOUNTER — Inpatient Hospital Stay: Payer: Medicare Other | Attending: Hematology

## 2023-10-02 DIAGNOSIS — D500 Iron deficiency anemia secondary to blood loss (chronic): Secondary | ICD-10-CM | POA: Insufficient documentation

## 2023-10-02 DIAGNOSIS — Z87891 Personal history of nicotine dependence: Secondary | ICD-10-CM | POA: Diagnosis not present

## 2023-10-02 DIAGNOSIS — D696 Thrombocytopenia, unspecified: Secondary | ICD-10-CM | POA: Diagnosis present

## 2023-10-02 DIAGNOSIS — K922 Gastrointestinal hemorrhage, unspecified: Secondary | ICD-10-CM | POA: Diagnosis present

## 2023-10-02 DIAGNOSIS — D649 Anemia, unspecified: Secondary | ICD-10-CM

## 2023-10-02 LAB — CBC WITH DIFFERENTIAL/PLATELET
Abs Immature Granulocytes: 0.03 10*3/uL (ref 0.00–0.07)
Basophils Absolute: 0 10*3/uL (ref 0.0–0.1)
Basophils Relative: 0 %
Eosinophils Absolute: 0.1 10*3/uL (ref 0.0–0.5)
Eosinophils Relative: 4 %
HCT: 29 % — ABNORMAL LOW (ref 39.0–52.0)
Hemoglobin: 9.4 g/dL — ABNORMAL LOW (ref 13.0–17.0)
Immature Granulocytes: 1 %
Lymphocytes Relative: 17 %
Lymphs Abs: 0.6 10*3/uL — ABNORMAL LOW (ref 0.7–4.0)
MCH: 32.2 pg (ref 26.0–34.0)
MCHC: 32.4 g/dL (ref 30.0–36.0)
MCV: 99.3 fL (ref 80.0–100.0)
Monocytes Absolute: 0.8 10*3/uL (ref 0.1–1.0)
Monocytes Relative: 23 %
Neutro Abs: 2 10*3/uL (ref 1.7–7.7)
Neutrophils Relative %: 55 %
Platelets: 80 10*3/uL — ABNORMAL LOW (ref 150–400)
RBC: 2.92 MIL/uL — ABNORMAL LOW (ref 4.22–5.81)
RDW: 14 % (ref 11.5–15.5)
Smear Review: DECREASED
WBC: 3.6 10*3/uL — ABNORMAL LOW (ref 4.0–10.5)
nRBC: 0 % (ref 0.0–0.2)

## 2023-10-02 LAB — IRON AND TIBC
Iron: 111 ug/dL (ref 45–182)
Saturation Ratios: 27 % (ref 17.9–39.5)
TIBC: 416 ug/dL (ref 250–450)
UIBC: 305 ug/dL

## 2023-10-02 LAB — COMPREHENSIVE METABOLIC PANEL
ALT: 21 U/L (ref 0–44)
AST: 26 U/L (ref 15–41)
Albumin: 3.9 g/dL (ref 3.5–5.0)
Alkaline Phosphatase: 68 U/L (ref 38–126)
Anion gap: 7 (ref 5–15)
BUN: 18 mg/dL (ref 8–23)
CO2: 24 mmol/L (ref 22–32)
Calcium: 9.3 mg/dL (ref 8.9–10.3)
Chloride: 104 mmol/L (ref 98–111)
Creatinine, Ser: 1.29 mg/dL — ABNORMAL HIGH (ref 0.61–1.24)
GFR, Estimated: 57 mL/min — ABNORMAL LOW (ref >60)
Glucose, Bld: 239 mg/dL — ABNORMAL HIGH (ref 70–99)
Potassium: 4.7 mmol/L (ref 3.5–5.1)
Sodium: 135 mmol/L (ref 135–145)
Total Bilirubin: 0.8 mg/dL (ref 0.0–1.2)
Total Protein: 6.3 g/dL — ABNORMAL LOW (ref 6.5–8.1)

## 2023-10-02 LAB — FERRITIN: Ferritin: 29 ng/mL (ref 24–336)

## 2023-10-02 LAB — VITAMIN B12: Vitamin B-12: 1207 pg/mL — ABNORMAL HIGH (ref 180–914)

## 2023-10-04 LAB — METHYLMALONIC ACID, SERUM: Methylmalonic Acid, Quantitative: 219 nmol/L (ref 0–378)

## 2023-10-09 ENCOUNTER — Inpatient Hospital Stay (HOSPITAL_BASED_OUTPATIENT_CLINIC_OR_DEPARTMENT_OTHER): Payer: Medicare Other | Admitting: Oncology

## 2023-10-09 VITALS — BP 133/67 | HR 66 | Temp 97.4°F | Resp 16 | Wt 189.6 lb

## 2023-10-09 DIAGNOSIS — D500 Iron deficiency anemia secondary to blood loss (chronic): Secondary | ICD-10-CM

## 2023-10-09 DIAGNOSIS — D696 Thrombocytopenia, unspecified: Secondary | ICD-10-CM | POA: Diagnosis not present

## 2023-10-09 DIAGNOSIS — D649 Anemia, unspecified: Secondary | ICD-10-CM

## 2023-10-09 NOTE — Progress Notes (Signed)
[facility] [address] [place] [address]   Clinic Day:  10/09/2023  Referring physician: Elfredia Nevins, MD  Patient Care Team: Elfredia Nevins, MD as PCP - General (Internal Medicine) Jonelle Sidle, MD as PCP - Cardiology (Cardiology) Jonelle Sidle, MD as Consulting Physician (Cardiology) Jena Gauss Gerrit Friends, MD as Consulting Physician (Gastroenterology) Doreatha Massed, MD as Medical Oncologist (Hematology)   ASSESSMENT & PLAN:   Assessment:  1.  Thrombocytopenia: - Patient seen at the request of Dr. Sherwood Gambler - 01/29/2023: PLT-88, B12 and folic acid normal - 04/24/2022: Platelets 99 - CTAP (02/03/2023): Splenomegaly 18 cm, liver cirrhosis, portal hypertension  2.  Normocytic anemia: - 01/28/2013: Hb-9.2, MCV-90, WBC-3.2, creatinine-1.06 - He has history of iron deficiency anemia secondary to recurrent GI bleed from small bowel/colonic AVMs.  History of esophageal varices without prior bleeding on beta-blocker. - Colonoscopy (04/14/2022): Nonbleeding internal hemorrhoids, single ulcer in the cecum, 5 nonbleeding colonic angiodysplastic lesions treated with APC. - EGD (01/23/2022): Columns of grade 1-2 varices, portal gastropathy, normal duodenum. - He has been taking iron tablet 1-2 times daily for almost a year.  He had to have blood transfusion last year.  No ice pica.  No previous parenteral iron therapy.  3.  Social/family history: - Lives at home with his wife.  Independent of ADLs and IADLs.  Seen today with his daughter.  He is a retired Naval architect.  Quit smoking more than 20 years ago. - Half brother had prostate cancer.  Half sister had breast cancer.  Plan:  1.  Moderate thrombocytopenia: - Platelet count has ranged from 77-125 over the past 8 to 9 years. -Etiology felt to be secondary to splenomegaly and liver disease. -Will continue to monitor labs every few months. - Labs from 10/02/2023 show a platelet count of 80. -No evidence  of bleeding or easy bruising per patient.  2.  Normocytic anemia: - Hemoglobin has fluctuated over the years with counts as low as 6.5- normal.  -Most recently he has been between 8-9.  -Previous workup did not reveal any underlying blood dyscrasia or hemolytic anemia. -No evidence of active bleed per patient, although suspect slow GI bleed from known AVMs.  He is followed closely by GI. -He was given 1 g of Monoferric on 03/27/2023, 05/15/2023 and on 07/22/2023. -He is currently taking iron supplements twice daily with good tolerance. -Lab work from today shows a ferritin of 29, TIBC 416 with iron saturation 27%.  Hemoglobin is 9.4. -We discussed maintaining a ferritin greater than 50 preferably closer to 100 and I would recommend additional IV iron.  He was agreeable.  We will get him scheduled in the next couple of weeks. -Recommend follow-up in approximately 4 months with labs a few days before.   PLAN SUMMARY: >> Plan for 1 additional dose of IV iron Monoferric 1 g in the next 2-3 weeks.  >> RTC in 4 months for follow-up with labs a few days before.    Orders Placed This Encounter  Procedures   CBC with Differential    Standing Status:   Future    Expected Date:   02/06/2024    Expiration Date:   10/08/2024   Comprehensive metabolic panel    Standing Status:   Future    Expected Date:   02/06/2024    Expiration Date:   10/08/2024   Ferritin    Standing Status:   Future    Expected Date:   02/06/2024  Expiration Date:   10/08/2024   Iron and TIBC ([facility] ONLY)    Standing Status:   Future    Expected Date:   02/06/2024    Expiration Date:   10/08/2024   I spent 25 minutes dedicated to the care of this patient (face-to-face and non-face-to-face) on the date of the encounter to include what is described in the assessment and plan.  Mauro Kaufmann, NP   1/24/202510:03 AM  CHIEF COMPLAINT/PURPOSE OF CONSULT:   Diagnosis: thrombocytopenia   Current Therapy:  Under workup  HISTORY OF PRESENT ILLNESS:   Derek Drake is a 78 y.o. male presenting to clinic today to review recent lab work.  He was sent over at the request of Dr. Sherwood Gambler.  He was last seen in clinic by me on 07/03/2023.  In the interim, he denies any hospitalizations, surgeries or changes to his baseline health.  He received a dose of iron on 07/22/2023 with great tolerance. Following IV iron had severe fatigue X 1 day about 2-3 days after infusion.  Unsure if it is related to IV iron but wanted to mention this.  Taken off Metformin which has helped his appetite.  Reports his energy levels are great.  Weight is up 5 pounds since his last visit.  He continues to deny any active bleeding.  He has follow-up with Dr. Jena Gauss in March.  Over the past few years, his platelet count continues to decline with fluctuation between 75,000-130,000 since 2015.   He has known anemia from upper GI blood loss and is followed by [facility].  He was taken off both Eliquis and aspirin in 2023 due to this.   PAST MEDICAL HISTORY:   Past Medical History: Past Medical History:  Diagnosis Date   Cirrhosis (HCC)    Immune to Hep A. Received Hep B vaccination in 2022.   Essential hypertension    GERD (gastroesophageal reflux disease)    Hypercholesteremia    PAF (paroxysmal atrial fibrillation) (HCC) 05/04/2014   Type 2 diabetes mellitus [facility])     Surgical History: Past Surgical History:  Procedure Laterality Date   BIOPSY  02/05/2017   Procedure: BIOPSY;  Surgeon: Corbin Ade, MD;  [facility];  Service: Endoscopy;;  gastric colon   CHOLECYSTECTOMY     COLONOSCOPY N/A 05/13/2013   Procedure: COLONOSCOPY;  Surgeon: Corbin Ade, MD;  [facility];  Service: Endoscopy;  Laterality: N/A;  8:30 AM   COLONOSCOPY N/A 02/05/2017   Procedure: COLONOSCOPY;  Surgeon: Corbin Ade, MD;  [facility];  Service: Endoscopy;  Laterality: N/A;   COLONOSCOPY WITH PROPOFOL N/A  03/26/2022   Procedure: COLONOSCOPY WITH PROPOFOL;  Surgeon: Corbin Ade, MD;  [facility];  Service: Endoscopy;  Laterality: N/A;  7:30am, moved to 7/12 @ 2:45   COLONOSCOPY WITH PROPOFOL N/A 04/14/2022   Procedure: COLONOSCOPY WITH PROPOFOL;  Surgeon: Lanelle Bal, DO;  [facility];  Service: Endoscopy;  Laterality: N/A;   ESOPHAGOGASTRODUODENOSCOPY N/A 02/05/2017   Procedure: ESOPHAGOGASTRODUODENOSCOPY (EGD);  Surgeon: Corbin Ade, MD;  [facility];  Service: Endoscopy;  Laterality: N/A;  730 - moved to 12:00    ESOPHAGOGASTRODUODENOSCOPY N/A 07/04/2020   Procedure: ESOPHAGOGASTRODUODENOSCOPY (EGD);  Surgeon: Corbin Ade, MD;  [facility];  Service: Endoscopy;  Laterality: N/A;  12:45pm   ESOPHAGOGASTRODUODENOSCOPY (EGD) WITH PROPOFOL N/A 01/23/2022   3 columns of grade 1-2 varices, portal gastropathy, normal duodenum. Begin Nadolol  20 mg daily.   GIVENS CAPSULE STUDY N/A 07/04/2020   Procedure: GIVENS CAPSULE STUDY;  Surgeon: Corbin Ade, MD;  [facility];  Service: Endoscopy;  Laterality: N/A;  Elected not to place a capsule today   GIVENS CAPSULE STUDY N/A 04/12/2022   Procedure: GIVENS CAPSULE STUDY;  Surgeon: Dolores Frame, MD;  [facility];  Service: Gastroenterology;  Laterality: N/A;   HOT HEMOSTASIS  03/26/2022   Procedure: HOT HEMOSTASIS (ARGON PLASMA COAGULATION/BICAP);  Surgeon: Corbin Ade, MD;  [facility];  Service: Endoscopy;;   HOT HEMOSTASIS  04/14/2022   Procedure: HOT HEMOSTASIS (ARGON PLASMA COAGULATION/BICAP);  Surgeon: Lanelle Bal, DO;  [facility];  Service: Endoscopy;;   POLYPECTOMY  02/05/2017   Procedure: POLYPECTOMY;  Surgeon: Corbin Ade, MD;  [facility];  Service: Endoscopy;;  colon    Social History: Social History   Socioeconomic History   Marital status: Married    Spouse name: Not on file   Number of  children: Not on file   Years of education: Not on file   Highest education level: Not on file  Occupational History   Not on file  Tobacco Use   Smoking status: Former    Current packs/day: 0.00    Average packs/day: 2.0 packs/day for 53.0 years (106.0 ttl pk-yrs)    Types: Cigarettes    Start date: 05/25/1956    Quit date: 05/25/2009    Years since quitting: 14.3   Smokeless tobacco: Current    Types: Chew  Vaping Use   Vaping status: Never Used  Substance and Sexual Activity   Alcohol use: Not Currently   Drug use: No   Sexual activity: Yes  Other Topics Concern   Not on file  Social History Narrative   Not on file   Social Drivers of Health   Financial Resource Strain: Low Risk  (05/07/2022)   Received from [facility], [facility]   Overall Financial Resource Strain (CARDIA)    Difficulty of Paying Living Expenses: Not hard at all  Food Insecurity: No Food Insecurity (05/07/2022)   Received from [facility]   Hunger Vital Sign    Worried About Running Out of Food in the Last Year: Never true    Ran Out of Food in the Last Year: Never true  Transportation Needs: No Transportation Needs (05/07/2022)   Received from [facility], [facility]   [facility] - Transportation    Lack of Transportation (Medical): No    Lack of Transportation (Non-Medical): No  Physical Activity: Inactive (05/07/2022)   Received from [facility], [facility]   Exercise Vital Sign    Days of Exercise per Week: 0 days    Minutes of Exercise per Session: 0 min  Stress: No Stress Concern Present (05/07/2022)   Received from [facility], [facility] - Occupational Stress Questionnaire    Feeling of Stress : Not at all  Social Connections: Socially Integrated (05/07/2022)   Received from [facility], [facility]   Social Connection and Isolation Panel [NHANES]    Frequency of Communication with  Friends and Family: More than three times a week    Frequency of Social Gatherings with Friends and Family: More than three times a week    Attends Religious Services: More than 4 times per year  Active Member of Clubs or Organizations: Yes    Attends Banker Meetings: More than 4 times per year    Marital Status: Married  Catering manager Violence: Not At Risk (05/07/2022)   Received from [facility], [facility]   Humiliation, Afraid, Rape, and Kick questionnaire    Fear of Current or Ex-Partner: No    Emotionally Abused: No    Physically Abused: No    Sexually Abused: No    Family History: Family History  Problem Relation Age of Onset   Prostate cancer Brother    Congestive Heart Failure Mother    Heart failure Brother    Colon cancer Neg Hx     Current Medications:  Current Outpatient Medications:    cholecalciferol (VITAMIN D3) 25 MCG (1000 UNIT) tablet, Take 1,000 Units by mouth daily., Disp: , Rfl:    clotrimazole-betamethasone (LOTRISONE) cream, Apply topically 2 (two) times daily., Disp: , Rfl:    [employer] IRON 325 (65 Fe) MG tablet, Take 325 mg by mouth 2 (two) times daily., Disp: , Rfl:    diltiazem (CARDIZEM CD) 360 MG 24 hr capsule, Take 360 mg by mouth daily. , Disp: , Rfl:    furosemide (LASIX) 20 MG tablet, Take 20-40 mg by mouth daily as needed., Disp: , Rfl:    gabapentin (NEURONTIN) 100 MG capsule, Take 100 mg by mouth at bedtime., Disp: , Rfl:    glimepiride (AMARYL) 2 MG tablet, Take 2 mg by mouth daily., Disp: , Rfl:    Krill Oil 500 MG CAPS, Take 500 mg by mouth daily., Disp: , Rfl:    losartan (COZAAR) 100 MG tablet, Take 100 mg by mouth daily., Disp: , Rfl:    milk thistle 175 MG tablet, Take 175 mg by mouth daily., Disp: , Rfl:    Multiple Vitamin (MULTIVITAMIN WITH MINERALS) TABS tablet, Take 1 tablet by mouth daily., Disp: , Rfl:    nadolol (CORGARD) 20 MG tablet, TAKE 1 TABLET BY MOUTH EVERY DAY, Disp: 90 tablet, Rfl: 3    pantoprazole (PROTONIX) 40 MG tablet, TAKE 1 TABLET BY MOUTH DAILY BEFORE BREAKFAST, Disp: 90 tablet, Rfl: 3   simvastatin (ZOCOR) 40 MG tablet, Take 40 mg by mouth every evening. , Disp: , Rfl:    tamsulosin (FLOMAX) 0.4 MG CAPS capsule, Take 0.4 mg by mouth daily., Disp: , Rfl:    Allergies: Allergies  Allergen Reactions   Rabbit Protein Swelling    Face swelled up   Hydrocodone-Acetaminophen Rash   Other Swelling and Rash    Dial soap   Ivp Dye [Iodinated Contrast Media] Other (See Comments)    Possible allergy... patient can not confirm or deny contrast allergy, needs pre meds pre Dr. Si Gaul (Everlie Eble Excell Seltzer 01/27/2023)    REVIEW OF SYSTEMS:   Review of Systems  Constitutional:  Negative for fatigue.  Gastrointestinal:  Negative for abdominal pain, blood in stool, constipation and diarrhea.  Genitourinary:  Negative for hematuria.      VITALS:   Blood pressure 133/67, pulse 66, temperature (!) 97.4 F (36.3 C), temperature source Oral, resp. rate 16, weight 189 lb 9.5 oz (86 kg), SpO2 99%.  Wt Readings from Last 3 Encounters:  10/09/23 189 lb 9.5 oz (86 kg)  07/03/23 184 lb (83.5 kg)  06/22/23 180 lb 3.2 oz (81.7 kg)    Body mass index is 26.44 kg/m.   PHYSICAL EXAM:   Physical Exam Constitutional:      Appearance: Normal appearance.  Cardiovascular:  Rate and Rhythm: Normal rate and regular rhythm.  Pulmonary:     Effort: Pulmonary effort is normal.     Breath sounds: Normal breath sounds.  Abdominal:     General: Bowel sounds are normal.     Palpations: Abdomen is soft.  Musculoskeletal:        General: No swelling. Normal range of motion.  Neurological:     Mental Status: He is alert and oriented to person, place, and time. Mental status is at baseline.     LABS:      Latest Ref Rng & Units 10/02/2023    9:38 AM 04/29/2023   11:09 AM 04/15/2023    8:25 AM  CBC  WBC 4.0 - 10.5 K/uL 3.6  3.3  3.1   Hemoglobin 13.0 - 17.0 g/dL 9.4  9.0  8.8    Hematocrit 39.0 - 52.0 % 29.0  27.4  27.1   Platelets 150 - 400 K/uL 80  79  78       Latest Ref Rng & Units 10/02/2023    9:38 AM 03/02/2023    9:38 AM 02/04/2023   10:53 AM  CMP  Glucose 70 - 99 mg/dL 960   454   BUN 8 - 23 mg/dL 18   18   Creatinine 0.98 - 1.24 mg/dL 1.19   1.47   Sodium 829 - 145 mmol/L 135   137   Potassium 3.5 - 5.1 mmol/L 4.7   4.6   Chloride 98 - 111 mmol/L 104   101   CO2 22 - 32 mmol/L 24   25   Calcium 8.9 - 10.3 mg/dL 9.3  9.4  56.2   Total Protein 6.5 - 8.1 g/dL 6.3   6.2   Total Bilirubin 0.0 - 1.2 mg/dL 0.8   0.4   Alkaline Phos 38 - 126 U/L 68     AST 15 - 41 U/L 26   27   ALT 0 - 44 U/L 21   22      No results found for: "CEA1", "CEA" / No results found for: "CEA1", "CEA" No results found for: "PSA1" No results found for: "CAN199" No results found for: "CAN125"  Lab Results  Component Value Date   TOTALPROTELP 6.2 02/20/2023   TOTALPROTELP 6.1 02/20/2023   ALBUMINELP 3.6 02/20/2023   A1GS 0.2 02/20/2023   A2GS 0.8 02/20/2023   BETS 1.0 02/20/2023   GAMS 0.5 02/20/2023   MSPIKE Not Observed 02/20/2023   SPEI Comment 02/20/2023   Lab Results  Component Value Date   TIBC 416 10/02/2023   TIBC 380 04/29/2023   TIBC 347 04/15/2023   FERRITIN 29 10/02/2023   FERRITIN 37 06/26/2023   FERRITIN 36 04/29/2023   IRONPCTSAT 27 10/02/2023   IRONPCTSAT 24 04/29/2023   IRONPCTSAT 23 04/15/2023   Lab Results  Component Value Date   LDH 128 02/20/2023     STUDIES:   No results found.

## 2023-10-14 ENCOUNTER — Telehealth: Payer: Self-pay | Admitting: Cardiology

## 2023-10-14 ENCOUNTER — Inpatient Hospital Stay: Payer: Medicare Other

## 2023-10-14 VITALS — BP 108/57 | HR 56 | Temp 97.8°F | Resp 16

## 2023-10-14 DIAGNOSIS — D500 Iron deficiency anemia secondary to blood loss (chronic): Secondary | ICD-10-CM | POA: Diagnosis not present

## 2023-10-14 MED ORDER — FERRIC DERISOMALTOSE(ONE DOSE) 1000 MG/10ML IV SOLN
1000.0000 mg | Freq: Once | INTRAVENOUS | Status: AC
  Administered 2023-10-14: 1000 mg via INTRAVENOUS
  Filled 2023-10-14: qty 1000

## 2023-10-14 MED ORDER — FAMOTIDINE IN NACL 20-0.9 MG/50ML-% IV SOLN
20.0000 mg | Freq: Once | INTRAVENOUS | Status: AC
Start: 2023-10-14 — End: 2023-10-14
  Administered 2023-10-14: 20 mg via INTRAVENOUS
  Filled 2023-10-14: qty 50

## 2023-10-14 MED ORDER — ACETAMINOPHEN 325 MG PO TABS
650.0000 mg | ORAL_TABLET | Freq: Once | ORAL | Status: AC
  Administered 2023-10-14: 650 mg via ORAL
  Filled 2023-10-14: qty 2

## 2023-10-14 MED ORDER — CETIRIZINE HCL 10 MG/ML IV SOLN
10.0000 mg | Freq: Once | INTRAVENOUS | Status: AC
  Administered 2023-10-14: 10 mg via INTRAVENOUS
  Filled 2023-10-14: qty 1

## 2023-10-14 MED ORDER — CETIRIZINE HCL 10 MG PO TABS: 10.0000 mg | ORAL_TABLET | Freq: Once | ORAL | Status: DC

## 2023-10-14 MED ORDER — SODIUM CHLORIDE 0.9 % IV SOLN
Freq: Once | INTRAVENOUS | Status: AC
Start: 2023-10-14 — End: 2023-10-14

## 2023-10-14 NOTE — Telephone Encounter (Signed)
Insurance Letter says medication is subject to certain limits (fewer than 30 tablets)  What is this ?? PharmD, any suggestions?

## 2023-10-14 NOTE — Patient Instructions (Signed)
[facility] [place] - A DEPT OF [facility]  Discharge Instructions: Thank you for choosing [facility] to provide your oncology and hematology care.  If you have a lab appointment with the Cancer Center - please note that after April 8th, 2024, all labs will be drawn in the cancer center.  You do not have to check in or register with the main entrance as you have in the past but will complete your check-in in the cancer center.    We strive to give you quality time with your provider. You may need to reschedule your appointment if you arrive late (15 or more minutes).  Arriving late affects you and other patients whose appointments are after yours.  Also, if you miss three or more appointments without notifying the office, you may be dismissed from the clinic at the provider's discretion.      For prescription refill requests, have your pharmacy contact our office and allow 72 hours for refills to be completed.    Today you received the following iron infusion.Monoferric    To help prevent nausea and vomiting after your treatment, we encourage you to take your nausea medication as directed.  BELOW ARE SYMPTOMS THAT SHOULD BE REPORTED IMMEDIATELY: *FEVER GREATER THAN 100.4 F (38 C) OR HIGHER *CHILLS OR SWEATING *NAUSEA AND VOMITING THAT IS NOT CONTROLLED WITH YOUR NAUSEA MEDICATION *UNUSUAL SHORTNESS OF BREATH *UNUSUAL BRUISING OR BLEEDING *URINARY PROBLEMS (pain or burning when urinating, or frequent urination) *BOWEL PROBLEMS (unusual diarrhea, constipation, pain near the anus) TENDERNESS IN MOUTH AND THROAT WITH OR WITHOUT PRESENCE OF ULCERS (sore throat, sores in mouth, or a toothache) UNUSUAL RASH, SWELLING OR PAIN  UNUSUAL VAGINAL DISCHARGE OR ITCHING   Items with * indicate a potential emergency and should be followed up as soon as possible or go to the Emergency Department if any problems should occur.  Please show the CHEMOTHERAPY ALERT  CARD or IMMUNOTHERAPY ALERT CARD at check-in to the Emergency Department and triage nurse.  Should you have questions after your visit or need to cancel or reschedule your appointment, please contact [facility] [place] - A DEPT OF Eligha Brideg[facility] [phone]  and follow the prompts.  Office hours are 8:00 a.m. to 4:30 p.m. Monday - Friday. Please note that voicemails left after 4:00 p.m. may not be returned until the following business day.  We are closed weekends and major holidays. You have access to a nurse at all times for urgent questions. Please call the main number to the clinic [phone] and follow the prompts.  For any non-urgent questions, you may also contact your provider using MyChart. We now offer e-Visits for anyone 1 and older to request care online for non-urgent symptoms. For details visit [email or web address].   Also download the MyChart app! Go to the app store, search "MyChart", open the app, select , and log in with your MyChart username and password.

## 2023-10-14 NOTE — Progress Notes (Signed)
Patient presents today for Monoferric infusion. Vital signs stable. Patient denies any side effects related to last iron infusion. No complaints noted at this time.

## 2023-10-14 NOTE — Progress Notes (Signed)
Monoferric iron infusion given per orders. Patient tolerated it well without problems. Vitals stable and discharged home from clinic ambulatory. Follow up as scheduled.

## 2023-10-14 NOTE — Telephone Encounter (Signed)
Pt came in office today and stated that he received a letter in the mail from his insurance stating they will not cover his diltiazem for 30 days at a time. They gave him a 30 day supply but stated going forward they will only cover it for a supply amount of less than 30. I have made a copy of the letter. Pt is wondering if there is something else he could take. His pharmacy is [facility] and the best number to reach him is [phone] or [phone].

## 2023-10-16 ENCOUNTER — Telehealth: Payer: Self-pay

## 2023-10-16 ENCOUNTER — Encounter: Payer: Self-pay | Admitting: Hematology

## 2023-10-16 ENCOUNTER — Other Ambulatory Visit (HOSPITAL_COMMUNITY): Payer: Self-pay

## 2023-10-16 DIAGNOSIS — I10 Essential (primary) hypertension: Secondary | ICD-10-CM

## 2023-10-16 DIAGNOSIS — I480 Paroxysmal atrial fibrillation: Secondary | ICD-10-CM

## 2023-10-16 MED ORDER — DILTIAZEM HCL ER BEADS 360 MG PO CP24: 360.0000 mg | ORAL_CAPSULE | Freq: Every day | ORAL | 5 refills | Status: DC

## 2023-10-16 NOTE — Telephone Encounter (Signed)
I spoke with wife, they will pick up rx from pharmacy

## 2023-10-16 NOTE — Telephone Encounter (Signed)
I spoke with wife and gave her update,they will pick up tiazac at [facility] in [place].

## 2023-10-16 NOTE — Telephone Encounter (Signed)
Medication needs to be changed to Tiazac 360mg  once daily. Rx changed.

## 2023-10-16 NOTE — Telephone Encounter (Signed)
Pharmacy Patient Advocate Encounter   Insurance verification completed.   The patient is insured through [employer] .   Patient got a letter that last fill of Diltiazem was filled under a non form NDC. Per test claims, plan prefers Tiazac.

## 2023-12-08 ENCOUNTER — Other Ambulatory Visit: Payer: Self-pay

## 2023-12-08 DIAGNOSIS — K7460 Unspecified cirrhosis of liver: Secondary | ICD-10-CM

## 2023-12-10 ENCOUNTER — Encounter: Payer: Self-pay | Admitting: Internal Medicine

## 2023-12-16 LAB — COMPREHENSIVE METABOLIC PANEL WITH GFR
ALT: 21 IU/L (ref 0–44)
AST: 29 IU/L (ref 0–40)
Albumin: 4.6 g/dL (ref 3.8–4.8)
Alkaline Phosphatase: 106 IU/L (ref 44–121)
BUN/Creatinine Ratio: 17 (ref 10–24)
BUN: 20 mg/dL (ref 8–27)
Bilirubin Total: 0.4 mg/dL (ref 0.0–1.2)
CO2: 20 mmol/L (ref 20–29)
Calcium: 9.9 mg/dL (ref 8.6–10.2)
Chloride: 103 mmol/L (ref 96–106)
Creatinine, Ser: 1.19 mg/dL (ref 0.76–1.27)
Globulin, Total: 1.9 g/dL (ref 1.5–4.5)
Glucose: 248 mg/dL — ABNORMAL HIGH (ref 70–99)
Potassium: 4.8 mmol/L (ref 3.5–5.2)
Sodium: 139 mmol/L (ref 134–144)
Total Protein: 6.5 g/dL (ref 6.0–8.5)
eGFR: 63 mL/min/{1.73_m2} (ref >59)

## 2023-12-16 LAB — PROTIME-INR
INR: 1 (ref 0.9–1.2)
Prothrombin Time: 11.3 s (ref 9.1–12.0)

## 2023-12-28 ENCOUNTER — Ambulatory Visit: Payer: Medicare Other | Admitting: Cardiology

## 2023-12-31 ENCOUNTER — Ambulatory Visit: Payer: Medicare Other | Attending: Cardiology | Admitting: Cardiology

## 2023-12-31 ENCOUNTER — Encounter: Payer: Self-pay | Admitting: Cardiology

## 2023-12-31 VITALS — BP 130/72 | HR 68 | Ht 71.0 in | Wt 188.4 lb

## 2023-12-31 DIAGNOSIS — I10 Essential (primary) hypertension: Secondary | ICD-10-CM | POA: Diagnosis present

## 2023-12-31 DIAGNOSIS — E782 Mixed hyperlipidemia: Secondary | ICD-10-CM | POA: Diagnosis present

## 2023-12-31 DIAGNOSIS — I480 Paroxysmal atrial fibrillation: Secondary | ICD-10-CM | POA: Insufficient documentation

## 2023-12-31 NOTE — Patient Instructions (Signed)
 Medication Instructions:  Your physician recommends that you continue on your current medications as directed. Please refer to the Current Medication list given to you today.   Labwork: None today  Testing/Procedures: None today  Follow-Up: 6 months  Any Other Special Instructions Will Be Listed Below (If Applicable).  If you need a refill on your cardiac medications before your next appointment, please call your pharmacy.

## 2023-12-31 NOTE — Progress Notes (Signed)
    Cardiology Office Note  Date: 12/31/2023   ID: Derek, Drake 08/01/1946, MRN [id number]  History of Present Illness: Derek Drake is a 78 y.o. male last seen in October 2024.  He is here for a routine visit.  Reports no chest pain, no palpitations or syncope.  He continues to follow with Dr. Lewayne Records.  I reviewed his recent lab work.  We went over his medications.  He reports compliance with therapy.  He is not anticoagulated given prior history of recurrent GI bleeding with documented AVMs.  I reviewed his ECG today which shows sinus rhythm with prolonged PR interval and increased voltage.  Physical Exam: VS:  BP 130/72   Pulse 68   Ht 5\' 11"  (1.803 m)   Wt 188 lb 6.4 oz (85.5 kg)   SpO2 99%   BMI 26.28 kg/m , BMI Body mass index is 26.28 kg/m.  Wt Readings from Last 3 Encounters:  12/31/23 188 lb 6.4 oz (85.5 kg)  10/09/23 189 lb 9.5 oz (86 kg)  07/03/23 184 lb (83.5 kg)    General: Patient appears comfortable at rest. HEENT: Conjunctiva and lids normal. Neck: Supple, no elevated JVP or carotid bruits. Lungs: Clear to auscultation, nonlabored breathing at rest. Cardiac: Regular rate and rhythm, no S3 or significant systolic murmur, no pericardial rub.  ECG:  An ECG dated 12/17/2022 was personally reviewed today and demonstrated:  Sinus rhythm with increased voltage.  Labwork:  10/02/2023: Hemoglobin 9.4; Platelets 80 12/15/2023: ALT 21; AST 29; BUN 20; Creatinine, Ser 1.19; Potassium 4.8; Sodium 139   Other Studies Reviewed Today:  No interval cardiac testing for review today.  Assessment and Plan:  1.  Paroxysmal atrial fibrillation with CHA2DS2-VASc score of 4.  Anticoagulation discontinued previously in light of recurrent GI bleeding with documented AVMs requiring PRBC transfusion.  He has declined Museum/gallery conservator.  ECG reviewed, he is in sinus rhythm today.  Continue Cardizem CD 360 mg daily and nadolol 20 mg daily.   2.  Primary hypertension.  Blood  pressure control reasonable today.  Continue Cozaar 100 mg daily.   3.  Mixed hyperlipidemia.  Continue Zocor 40 mg daily and keep follow-up with Dr. Lewayne Records for repeat FLP.  Last LDL was 51.  Disposition:  Follow up  6 months.  Signed, Gerard Knight, M.D., F.A.C.[facility]

## 2024-01-01 ENCOUNTER — Other Ambulatory Visit: Payer: Self-pay | Admitting: Internal Medicine

## 2024-01-12 ENCOUNTER — Other Ambulatory Visit: Payer: Self-pay | Admitting: *Deleted

## 2024-01-12 ENCOUNTER — Encounter: Payer: Self-pay | Admitting: Internal Medicine

## 2024-01-12 ENCOUNTER — Ambulatory Visit (INDEPENDENT_AMBULATORY_CARE_PROVIDER_SITE_OTHER): Admitting: Internal Medicine

## 2024-01-12 VITALS — BP 167/76 | HR 65 | Temp 97.7°F | Ht 71.0 in | Wt 186.0 lb

## 2024-01-12 DIAGNOSIS — R635 Abnormal weight gain: Secondary | ICD-10-CM

## 2024-01-12 DIAGNOSIS — K7460 Unspecified cirrhosis of liver: Secondary | ICD-10-CM

## 2024-01-12 DIAGNOSIS — Z862 Personal history of diseases of the blood and blood-forming organs and certain disorders involving the immune mechanism: Secondary | ICD-10-CM

## 2024-01-12 DIAGNOSIS — Z8719 Personal history of other diseases of the digestive system: Secondary | ICD-10-CM

## 2024-01-12 NOTE — Progress Notes (Signed)
 Primary Care Physician:  Kathyleen Parkins, MD Primary Gastroenterologist:  Dr. Riley Cheadle  Pre-Procedure History & Physical: HPI:  Derek Drake is a 78 y.o. male here for follow-up MASLD/ cirrhosis, history of iron deficiency anemia secondary to portal gastropathy GERD GI AVMs returns for follow-up.  Last labs January noted a stable hemoglobin 9.4 he was pancytopenic with a white count of 3.6 platelet 80,000.  Iron studies from January indicate iron saturation 27% iron 111; known esophageal varices no prior variceal hemorrhage on Corgard . Swelling in his ankles - PCP rx'd as needed Lasix.  Of note, patient states he salts everything; all  of his food when he eats. Weight up 6 pounds since the office visit.  Suboptimal blood sugar control off metformin;he has been back on it and is doing fairly well.  Overdue for surveillance hepatic ultrasound.   Past Medical History:  Diagnosis Date   Cirrhosis (HCC)    Immune to Hep A. Received Hep B vaccination in 2022.   Essential hypertension    GERD (gastroesophageal reflux disease)    Hypercholesteremia    PAF (paroxysmal atrial fibrillation) (HCC) 05/04/2014   Type 2 diabetes mellitus [facility]     Past Surgical History:  Procedure Laterality Date   BIOPSY  02/05/2017   Procedure: BIOPSY;  Surgeon: Suzette Espy, MD;  [facility];  Service: Endoscopy;;  gastric colon   CHOLECYSTECTOMY     COLONOSCOPY N/A 05/13/2013   Procedure: COLONOSCOPY;  Surgeon: Suzette Espy, MD;  [facility];  Service: Endoscopy;  Laterality: N/A;  8:30 AM   COLONOSCOPY N/A 02/05/2017   Procedure: COLONOSCOPY;  Surgeon: Suzette Espy, MD;  [facility];  Service: Endoscopy;  Laterality: N/A;   COLONOSCOPY WITH PROPOFOL  N/A 03/26/2022   Procedure: COLONOSCOPY WITH PROPOFOL ;  Surgeon: Suzette Espy, MD;  [facility];  Service: Endoscopy;  Laterality: N/A;  7:30am, moved to 7/12 @ 2:45   COLONOSCOPY WITH PROPOFOL  N/A  04/14/2022   Procedure: COLONOSCOPY WITH PROPOFOL ;  Surgeon: Vinetta Greening, DO;  [facility];  Service: Endoscopy;  Laterality: N/A;   ESOPHAGOGASTRODUODENOSCOPY N/A 02/05/2017   Procedure: ESOPHAGOGASTRODUODENOSCOPY (EGD);  Surgeon: Suzette Espy, MD;  [facility];  Service: Endoscopy;  Laterality: N/A;  730 - moved to 12:00    ESOPHAGOGASTRODUODENOSCOPY N/A 07/04/2020   Procedure: ESOPHAGOGASTRODUODENOSCOPY (EGD);  Surgeon: Suzette Espy, MD;  [facility];  Service: Endoscopy;  Laterality: N/A;  12:45pm   ESOPHAGOGASTRODUODENOSCOPY (EGD) WITH PROPOFOL  N/A 01/23/2022   3 columns of grade 1-2 varices, portal gastropathy, normal duodenum. Begin Nadolol  20 mg daily.   GIVENS CAPSULE STUDY N/A 07/04/2020   Procedure: GIVENS CAPSULE STUDY;  Surgeon: Suzette Espy, MD;  [facility];  Service: Endoscopy;  Laterality: N/A;  Elected not to place a capsule today   GIVENS CAPSULE STUDY N/A 04/12/2022   Procedure: GIVENS CAPSULE STUDY;  Surgeon: Urban Garden, MD;  [facility];  Service: Gastroenterology;  Laterality: N/A;   HOT HEMOSTASIS  03/26/2022   Procedure: HOT HEMOSTASIS (ARGON PLASMA COAGULATION/BICAP);  Surgeon: Suzette Espy, MD;  [facility];  Service: Endoscopy;;   HOT HEMOSTASIS  04/14/2022   Procedure: HOT HEMOSTASIS (ARGON PLASMA COAGULATION/BICAP);  Surgeon: Vinetta Greening, DO;  [facility];  Service: Endoscopy;;   POLYPECTOMY  02/05/2017   Procedure: POLYPECTOMY;  Surgeon: Suzette Espy, MD;  [facility];  Service: Endoscopy;;  colon    Prior to Admission medications   Medication Sig Start Date End Date Taking? Authorizing Provider  cholecalciferol (VITAMIN D3) 25 MCG (1000 UNIT) tablet Take 1,000 Units by mouth daily.   Yes [provider]  clotrimazole-betamethasone (LOTRISONE) cream Apply topically 2 (two) times daily. 04/24/23  Yes [provider]   [employer] IRON 325 (65 Fe) MG tablet Take 325 mg by mouth 2 (two) times daily. 06/17/22  Yes [provider]  diltiazem  (TIAZAC ) 360 MG 24 hr capsule Take 1 capsule (360 mg total) by mouth daily. 10/16/23  Yes Gerard Knight, MD  furosemide (LASIX) 20 MG tablet Take 20-40 mg by mouth daily as needed. 06/11/23  Yes [provider]  gabapentin  (NEURONTIN ) 100 MG capsule Take 100 mg by mouth at bedtime. 07/07/19  Yes [provider]  Oksana Bergamo Oil 500 MG CAPS Take 500 mg by mouth daily.   Yes [provider]  losartan (COZAAR) 100 MG tablet Take 100 mg by mouth daily. 06/17/22  Yes [provider]  metFORMIN (GLUCOPHAGE) 500 MG tablet Take 1,000 mg by mouth 2 (two) times daily. 12/25/23  Yes [provider]  milk thistle 175 MG tablet Take 175 mg by mouth daily.   Yes [provider]  Multiple Vitamin (MULTIVITAMIN WITH MINERALS) TABS tablet Take 1 tablet by mouth daily.   Yes [provider]  nadolol  (CORGARD ) 20 MG tablet TAKE 1 TABLET BY MOUTH EVERY DAY 01/04/24  Yes Travaris Kosh, Windsor Hatcher, MD  pantoprazole  (PROTONIX ) 40 MG tablet TAKE 1 TABLET BY MOUTH DAILY BEFORE BREAKFAST 05/11/23  Yes Lanney Pitts, PA-C  simvastatin  (ZOCOR ) 40 MG tablet Take 40 mg by mouth every evening.    Yes [provider]  tamsulosin (FLOMAX) 0.4 MG CAPS capsule Take 0.4 mg by mouth daily. 04/24/23  Yes [provider]    Allergies as of 01/12/2024 - Review Complete 01/12/2024  Allergen Reaction Noted   Rabbit protein Swelling 05/13/2013   Hydrocodone-acetaminophen  Rash 12/07/2015   Other Swelling and Rash 05/13/2013   Ivp dye [iodinated contrast media] Other (See Comments) 01/30/2017    Family History  Problem Relation Age of Onset   Prostate cancer Brother    Congestive Heart Failure Mother    Heart failure Brother    Colon cancer Neg Hx     Social History   Socioeconomic History   Marital status: Married    Spouse name: Not on  file   Number of children: Not on file   Years of education: Not on file   Highest education level: Not on file  Occupational History   Not on file  Tobacco Use   Smoking status: Former    Current packs/day: 0.00    Average packs/day: 2.0 packs/day for 53.0 years (106.0 ttl pk-yrs)    Types: Cigarettes    Start date: 05/25/1956    Quit date: 05/25/2009    Years since quitting: 14.6   Smokeless tobacco: Current    Types: Chew  Vaping Use   Vaping status: Never Used  Substance and Sexual Activity   Alcohol use: Not Currently   Drug use: No   Sexual activity: Yes  Other Topics Concern   Not on file  Social History Narrative   Not on file   Social Drivers of Health   Financial Resource Strain: Low Risk  (05/07/2022)   Received from [facility], [facility]   Overall Financial Resource Strain (CARDIA)  Difficulty of Paying Living Expenses: Not hard at all  Food Insecurity: No Food Insecurity (05/07/2022)   Received from [facility], [facility]   Hunger Vital Sign    Worried About Running Out of Food in the Last Year: Never true    Ran Out of Food in the Last Year: Never true  Transportation Needs: No Transportation Needs (05/07/2022)   Received [facility], [facility]   [facility] - Transportation    Lack of Transportation (Medical): No    Lack of Transportation (Non-Medical): No  Physical Activity: Inactive (05/07/2022)   Received from [facility], [facility]   Exercise Vital Sign    Days of Exercise per Week: 0 days    Minutes of Exercise per Session: 0 min  Stress: No Stress Concern Present (05/07/2022)   Received from [facility], [facility] - Occupational Stress Questionnaire    Feeling of Stress : Not at all  Social Connections: Socially Integrated (05/07/2022)   Received from [facility], [facility]   Social Connection and Isolation Panel [NHANES]    Frequency of  Communication with Friends and Family: More than three times a week    Frequency of Social Gatherings with Friends and Family: More than three times a week    Attends Religious Services: More than 4 times per year    Active Member of [employer] or Organizations: Yes    Attends Engineer, structural: More than 4 times per year    Marital Status: Married  Catering manager Violence: Not At Risk (05/07/2022)   Received from [facility], [facility]   Humiliation, Afraid, Rape, and Kick questionnaire    Fear of Current or Ex-Partner: No    Emotionally Abused: No    Physically Abused: No    Sexually Abused: No    Review of Systems: See HPI, otherwise negative ROS  Physical Exam: BP (!) 167/76 (BP Location: Left Arm, Patient Position: Sitting, Cuff Size: Normal)   Pulse 65   Temp 97.7 F (36.5 C) (Oral)   Ht 5\' 11"  (1.803 m)   Wt 186 lb (84.4 kg)   SpO2 98%   BMI 25.94 kg/m  General:   Alert,  Well-developed, well-nourished, pleasant and cooperative in NAD Heart:  Regular rate and rhythm; no murmurs, clicks, rubs,  or gallops. Abdomen: Non-distended, normal bowel sounds.  Soft and nontender without appreciable mass or hepatosplenomegaly.  Pulses:  Normal pulses noted. Extremities:  Without clubbing or edema.  Impression/Plan: 78 year old gentleman with compensated MASLD old/cirrhosis known portal hypertension with esophageal varices no prior bleeding on Corgard .  History of GI AVMs portal hypertension gastric gastropathy producing iron deficiency anemia which appears to have resolved.  Hemoglobin is stable. Weight gain.  Lower extremity noted for which he was given furosemide.  Salt intake likely far exceeding recommended guidelines.  Recommendations:  Need hepatic ultrasound now and every 6 months.  C-Met INR today  Needs to cut excessive salt intake.  Discussed at length.  Provided information on 2 g sodium diet.   OV 6 months      Notice: This dictation was  prepared with Dragon dictation along with smaller phrase technology. Any transcriptional errors that result from this process are unintentional and may not be corrected upon review.

## 2024-01-12 NOTE — Patient Instructions (Signed)
 Good to see you again today as always!  Need to cut way back on your salt should limit salt intake to 2 g daily.  Do not add any extra salt to any of your food  Information on limiting salt to 2 g daily provided  C-Met and INR today  Liver ultrasound every 6 months need 1 now  Office visit in 6 months

## 2024-01-26 ENCOUNTER — Ambulatory Visit (HOSPITAL_COMMUNITY)
Admission: RE | Admit: 2024-01-26 | Discharge: 2024-01-26 | Disposition: A | Discharge status: Home / Self Care | Source: Ambulatory Visit | Attending: Internal Medicine | Admitting: Internal Medicine

## 2024-01-26 DIAGNOSIS — K7581 Nonalcoholic steatohepatitis (NASH): Secondary | ICD-10-CM | POA: Diagnosis present

## 2024-01-26 DIAGNOSIS — K7460 Unspecified cirrhosis of liver: Secondary | ICD-10-CM | POA: Insufficient documentation

## 2024-01-27 ENCOUNTER — Ambulatory Visit: Payer: Self-pay | Admitting: Internal Medicine

## 2024-01-29 ENCOUNTER — Inpatient Hospital Stay: Payer: Medicare Other | Attending: Hematology

## 2024-01-29 DIAGNOSIS — K7460 Unspecified cirrhosis of liver: Secondary | ICD-10-CM | POA: Insufficient documentation

## 2024-01-29 DIAGNOSIS — K922 Gastrointestinal hemorrhage, unspecified: Secondary | ICD-10-CM | POA: Diagnosis present

## 2024-01-29 DIAGNOSIS — R161 Splenomegaly, not elsewhere classified: Secondary | ICD-10-CM | POA: Diagnosis not present

## 2024-01-29 DIAGNOSIS — I480 Paroxysmal atrial fibrillation: Secondary | ICD-10-CM | POA: Diagnosis not present

## 2024-01-29 DIAGNOSIS — D649 Anemia, unspecified: Secondary | ICD-10-CM

## 2024-01-29 DIAGNOSIS — D61818 Other pancytopenia: Secondary | ICD-10-CM | POA: Insufficient documentation

## 2024-01-29 DIAGNOSIS — M10072 Idiopathic gout, left ankle and foot: Secondary | ICD-10-CM | POA: Insufficient documentation

## 2024-01-29 DIAGNOSIS — D500 Iron deficiency anemia secondary to blood loss (chronic): Secondary | ICD-10-CM | POA: Diagnosis present

## 2024-01-29 DIAGNOSIS — Z803 Family history of malignant neoplasm of breast: Secondary | ICD-10-CM | POA: Insufficient documentation

## 2024-01-29 DIAGNOSIS — E119 Type 2 diabetes mellitus without complications: Secondary | ICD-10-CM | POA: Diagnosis not present

## 2024-01-29 DIAGNOSIS — Z7984 Long term (current) use of oral hypoglycemic drugs: Secondary | ICD-10-CM | POA: Insufficient documentation

## 2024-01-29 DIAGNOSIS — Z87891 Personal history of nicotine dependence: Secondary | ICD-10-CM | POA: Insufficient documentation

## 2024-01-29 DIAGNOSIS — Z8042 Family history of malignant neoplasm of prostate: Secondary | ICD-10-CM | POA: Diagnosis not present

## 2024-01-29 LAB — COMPREHENSIVE METABOLIC PANEL WITH GFR
ALT: 21 U/L (ref 0–44)
AST: 28 U/L (ref 15–41)
Albumin: 4 g/dL (ref 3.5–5.0)
Alkaline Phosphatase: 67 U/L (ref 38–126)
Anion gap: 8 (ref 5–15)
BUN: 19 mg/dL (ref 8–23)
CO2: 24 mmol/L (ref 22–32)
Calcium: 9.3 mg/dL (ref 8.9–10.3)
Chloride: 103 mmol/L (ref 98–111)
Creatinine, Ser: 1.15 mg/dL (ref 0.61–1.24)
GFR, Estimated: >60 mL/min (ref >60)
Glucose, Bld: 275 mg/dL — ABNORMAL HIGH (ref 70–99)
Potassium: 4.6 mmol/L (ref 3.5–5.1)
Sodium: 135 mmol/L (ref 135–145)
Total Bilirubin: 0.9 mg/dL (ref 0.0–1.2)
Total Protein: 6.4 g/dL — ABNORMAL LOW (ref 6.5–8.1)

## 2024-01-29 LAB — CBC WITH DIFFERENTIAL/PLATELET
Abs Immature Granulocytes: 0.03 10*3/uL (ref 0.00–0.07)
Basophils Absolute: 0 10*3/uL (ref 0.0–0.1)
Basophils Relative: 0 %
Eosinophils Absolute: 0 10*3/uL (ref 0.0–0.5)
Eosinophils Relative: 2 %
HCT: 28 % — ABNORMAL LOW (ref 39.0–52.0)
Hemoglobin: 9.7 g/dL — ABNORMAL LOW (ref 13.0–17.0)
Immature Granulocytes: 1 %
Lymphocytes Relative: 19 %
Lymphs Abs: 0.5 10*3/uL — ABNORMAL LOW (ref 0.7–4.0)
MCH: 32.9 pg (ref 26.0–34.0)
MCHC: 34.6 g/dL (ref 30.0–36.0)
MCV: 94.9 fL (ref 80.0–100.0)
Monocytes Absolute: 0.6 10*3/uL (ref 0.1–1.0)
Monocytes Relative: 24 %
Neutro Abs: 1.4 10*3/uL — ABNORMAL LOW (ref 1.7–7.7)
Neutrophils Relative %: 54 %
Platelets: 58 10*3/uL — ABNORMAL LOW (ref 150–400)
RBC: 2.95 MIL/uL — ABNORMAL LOW (ref 4.22–5.81)
RDW: 14.3 % (ref 11.5–15.5)
WBC: 2.5 10*3/uL — ABNORMAL LOW (ref 4.0–10.5)
nRBC: 0 % (ref 0.0–0.2)

## 2024-01-29 LAB — IRON AND TIBC
Iron: 114 ug/dL (ref 45–182)
Saturation Ratios: 27 % (ref 17.9–39.5)
TIBC: 417 ug/dL (ref 250–450)
UIBC: 303 ug/dL

## 2024-01-29 LAB — FERRITIN: Ferritin: 28 ng/mL (ref 24–336)

## 2024-02-05 ENCOUNTER — Inpatient Hospital Stay

## 2024-02-05 ENCOUNTER — Inpatient Hospital Stay (HOSPITAL_BASED_OUTPATIENT_CLINIC_OR_DEPARTMENT_OTHER): Payer: Medicare Other | Admitting: Oncology

## 2024-02-05 VITALS — BP 134/61 | HR 63 | Temp 97.6°F | Resp 18 | Wt 185.9 lb

## 2024-02-05 DIAGNOSIS — D619 Aplastic anemia, unspecified: Secondary | ICD-10-CM

## 2024-02-05 DIAGNOSIS — D61818 Other pancytopenia: Secondary | ICD-10-CM | POA: Diagnosis not present

## 2024-02-05 LAB — RETICULOCYTES
Immature Retic Fract: 13.2 % (ref 2.3–15.9)
RBC.: 2.89 MIL/uL — ABNORMAL LOW (ref 4.22–5.81)
Retic Count, Absolute: 67.3 10*3/uL (ref 19.0–186.0)
Retic Ct Pct: 2.3 % (ref 0.4–3.1)

## 2024-02-05 LAB — VITAMIN B12: Vitamin B-12: 1326 pg/mL — ABNORMAL HIGH (ref 180–914)

## 2024-02-05 LAB — FOLATE: Folate: 31.4 ng/mL (ref >5.9)

## 2024-02-05 MED ORDER — PREDNISONE 20 MG PO TABS: 20.0000 mg | ORAL_TABLET | Freq: Every day | ORAL | 0 refills | Status: DC

## 2024-02-05 NOTE — Progress Notes (Signed)
 [facility] [address], [place] [address]   Clinic Day:  02/05/2024  Referring physician: Kathyleen Parkins, MD  Patient Care Team: Kathyleen Parkins, MD as PCP - General (Internal Medicine) Gerard Knight, MD as PCP - Cardiology (Cardiology) Gerard Knight, MD as Consulting Physician (Cardiology) Riley Cheadle Windsor Hatcher, MD as Consulting Physician (Gastroenterology) Paulett Boros, MD as Medical Oncologist (Hematology)   ASSESSMENT & PLAN:   Assessment:  1.  Thrombocytopenia: - Patient seen at the request of Dr. Lewayne Records - 01/29/2023: PLT-88, B12 and folic acid  normal - 04/24/2022: Platelets 99 - CTAP (02/03/2023): Splenomegaly 18 cm, liver cirrhosis, portal hypertension  2.  Normocytic anemia: - 01/28/2013: Hb-9.2, MCV-90, WBC-3.2, creatinine-1.06 - He has history of iron deficiency anemia secondary to recurrent GI bleed from small bowel/colonic AVMs.  History of esophageal varices without prior bleeding on beta-blocker. - Colonoscopy (04/14/2022): Nonbleeding internal hemorrhoids, single ulcer in the cecum, 5 nonbleeding colonic angiodysplastic lesions treated with APC. - EGD (01/23/2022): Columns of grade 1-2 varices, portal gastropathy, normal duodenum. - He has been taking iron tablet 1-2 times daily for almost a year.  He had to have blood transfusion last year.  No ice pica.  No previous parenteral iron therapy.  3.  Social/family history: - Lives at home with his wife.  Independent of ADLs and IADLs.  Seen today with his daughter.  He is a retired [employer] architect.  Quit smoking more than 20 years ago. - Half brother had prostate cancer.  Half sister had breast cancer.  Plan:  1.  Moderate thrombocytopenia: - Platelet count has ranged from 77-125 over the past 8 to 9 years. -Etiology felt to be secondary to splenomegaly and liver disease. -Will continue to monitor labs every few months. - Labs from 01/29/2024 show a platelet count of 58. -No evidence  of bleeding or easy bruising per patient. -Discussed case with Dr. Orvis Blare who recommended additional labs including flow cytometry, vitamin B12, folate and reticulocyte count.  Will discuss bone marrow biopsy when these have resulted.   2.  Normocytic anemia: - Hemoglobin has fluctuated over the years with counts as low as 6.5- normal.  -Most recently he has been between 8-9.  -Previous workup did not reveal any underlying blood dyscrasia or hemolytic anemia. -No evidence of active bleed per patient, although suspect slow GI bleed from known AVMs.  He is followed closely by GI. -He was given 1 g of Monoferric  on 03/27/2023, 05/15/2023, 07/22/2023 and 10/14/23. -He is currently taking iron supplements twice daily with good tolerance. -Lab work from today shows a hemoglobin 9.7 (9.4), ferritin 28 (29) and iron saturation 27%.  TIBC 417. -He does not need any additional IV iron at this time.   3.  Neutropenia: -This has been low since October 2021 ranging from 2.3-normal.  -Labs from 01/29/2024 show a blood cell count of 2.5.  ANC is 1.4.  -He denies any recent infections or use of steroids.  He has not had an antibiotic in quite some time.  Addendum: Discussed case with Dr. Orvis Blare and although he has intermittently had pancytopenia, it appears to be worse.  We discussed additional lab work and will review results to see if bone marrow biopsy is warranted.  Patient understands.   PLAN SUMMARY: >> Lab add-on today-flow cytometry (lymphoma/leukemia), reticulocyte count, folate and vitamin B12. >> Telephone call in 2 weeks to review results.  Possible bone marrow.     No orders of the defined types were  placed in this encounter.  I spent 25 minutes dedicated to the care of this patient (face-to-face and non-face-to-face) on the date of the encounter to include what is described in the assessment and plan.  Aurther Blue, NP   5/23/20258:43 AM  CHIEF COMPLAINT/PURPOSE OF CONSULT:    Diagnosis: Pancytopenia  Current Therapy: Under workup  HISTORY OF PRESENT ILLNESS:   Derek Drake is a 78 y.o. male presenting to clinic today to review recent lab work.  He was sent over at the request of Dr. Lewayne Records.  He was last seen in clinic by me on 10/09/2023.  He received 1 g Monoferric  on 10/14/2023 with good tolerance.  In the interim, he denies any hospitalizations, surgeries or changes to his baseline health.  He is followed by Dr. Londa Rival for atrial fibrillation and was seen for follow-up in 12/31/2023.  He is also followed closely by gastroenterology and recently had an ultrasound of his liver for surveillance of his cirrhosis.  Everything appeared stable.  Reports he currently is having a gout flare in his left great toe.  Reports symptoms started a few days ago.  He has taken a few doses of ibuprofen without relief.  Wondering if we can give him any medication.  He does have history of type 2 diabetes and is on metformin.  More recently, medication was adjusted and he is now taking glimepiride as well which has helped with his abdominal bloating and diarrhea.    Over the past few years, his platelet count continues to decline with fluctuation between 75,000-130,000 since 2015.   He has known anemia from upper GI blood loss and is followed by [facility].  He was taken off both Eliquis  and aspirin  in 2023 due to this.   PAST MEDICAL HISTORY:   Past Medical History: Past Medical History:  Diagnosis Date   Cirrhosis (HCC)    Immune to Hep A. Received Hep B vaccination in 2022.   Essential hypertension    GERD (gastroesophageal reflux disease)    Hypercholesteremia    PAF (paroxysmal atrial fibrillation) (HCC) 05/04/2014   Type 2 diabetes mellitus [facility])     Surgical History: Past Surgical History:  Procedure Laterality Date   BIOPSY  02/05/2017   Procedure: BIOPSY;  Surgeon: Suzette Espy, MD;  [facility];  Service: Endoscopy;;  gastric colon   CHOLECYSTECTOMY      COLONOSCOPY N/A 05/13/2013   Procedure: COLONOSCOPY;  Surgeon: Suzette Espy, MD;  [facility];  Service: Endoscopy;  Laterality: N/A;  8:30 AM   COLONOSCOPY N/A 02/05/2017   Procedure: COLONOSCOPY;  Surgeon: Suzette Espy, MD;  [facility];  Service: Endoscopy;  Laterality: N/A;   COLONOSCOPY WITH PROPOFOL  N/A 03/26/2022   Procedure: COLONOSCOPY WITH PROPOFOL ;  Surgeon: Suzette Espy, MD;  [facility];  Service: Endoscopy;  Laterality: N/A;  7:30am, moved to 7/12 @ 2:45   COLONOSCOPY WITH PROPOFOL  N/A 04/14/2022   Procedure: COLONOSCOPY WITH PROPOFOL ;  Surgeon: Vinetta Greening, DO;  [facility];  Service: Endoscopy;  Laterality: N/A;   ESOPHAGOGASTRODUODENOSCOPY N/A 02/05/2017   Procedure: ESOPHAGOGASTRODUODENOSCOPY (EGD);  Surgeon: Suzette Espy, MD;  [facility];  Service: Endoscopy;  Laterality: N/A;  730 - moved to 12:00    ESOPHAGOGASTRODUODENOSCOPY N/A 07/04/2020   Procedure: ESOPHAGOGASTRODUODENOSCOPY (EGD);  Surgeon: Suzette Espy, MD;  [facility];  Service: Endoscopy;  Laterality: N/A;  12:45pm   ESOPHAGOGASTRODUODENOSCOPY (EGD) WITH PROPOFOL  N/A  01/23/2022   3 columns of grade 1-2 varices, portal gastropathy, normal duodenum. Begin Nadolol  20 mg daily.   GIVENS CAPSULE STUDY N/A 07/04/2020   Procedure: GIVENS CAPSULE STUDY;  Surgeon: Suzette Espy, MD;  [facility];  Service: Endoscopy;  Laterality: N/A;  Elected not to place a capsule today   GIVENS CAPSULE STUDY N/A 04/12/2022   Procedure: GIVENS CAPSULE STUDY;  Surgeon: Urban Garden, MD;  [facility];  Service: Gastroenterology;  Laterality: N/A;   HOT HEMOSTASIS  03/26/2022   Procedure: HOT HEMOSTASIS (ARGON PLASMA COAGULATION/BICAP);  Surgeon: Suzette Espy, MD;  [facility];  Service: Endoscopy;;   HOT HEMOSTASIS  04/14/2022   Procedure: HOT HEMOSTASIS (ARGON PLASMA COAGULATION/BICAP);  Surgeon:  Vinetta Greening, DO;  [facility];  Service: Endoscopy;;   POLYPECTOMY  02/05/2017   Procedure: POLYPECTOMY;  Surgeon: Suzette Espy, MD;  [facility];  Service: Endoscopy;;  colon    Social History: Social History   Socioeconomic History   Marital status: Married    Spouse name: Not on file   Number of children: Not on file   Years of education: Not on file   Highest education level: Not on file  Occupational History   Not on file  Tobacco Use   Smoking status: Former    Current packs/day: 0.00    Average packs/day: 2.0 packs/day for 53.0 years (106.0 ttl pk-yrs)    Types: Cigarettes    Start date: 05/25/1956    Quit date: 05/25/2009    Years since quitting: 14.7   Smokeless tobacco: Current    Types: Chew  Vaping Use   Vaping status: Never Used  Substance and Sexual Activity   Alcohol use: Not Currently   Drug use: No   Sexual activity: Yes  Other Topics Concern   Not on file  Social History Narrative   Not on file   Social Drivers of Health   Financial Resource Strain: Low Risk  (05/07/2022)   Received from [facility], [facility]   Overall Financial Resource Strain (CARDIA)    Difficulty of Paying Living Expenses: Not hard at all  Food Insecurity: No Food Insecurity (05/07/2022)   Received from [facility], [facility]   Hunger Vital Sign    Worried About Running Out of Food in the Last Year: Never true    Ran Out of Food in the Last Year: Never true  Transportation Needs: No Transportation Needs (05/07/2022)   Received from [facility], [facility]   [facility] - Transportation    Lack of Transportation (Medical): No    Lack of Transportation (Non-Medical): No  Physical Activity: Inactive (05/07/2022)   Received from [facility], [facility]   Exercise Vital Sign    Days of Exercise per Week: 0 days    Minutes of Exercise per Session: 0 min  Stress: No Stress Concern Present (05/07/2022)   Received from  [facility], [facility] - Occupational Stress Questionnaire    Feeling of Stress : Not at all  Social Connections: Socially Integrated (05/07/2022)   Received from [facility], [facility]   Social Connection and Isolation Panel [NHANES]    Frequency of Communication with Friends and Family: More than three times a week    Frequency of Social Gatherings with Friends and Family: More than three times a  week    Attends Religious Services: More than 4 times per year    Active Member of Clubs or Organizations: Yes    Attends Banker Meetings: More than 4 times per year    Marital Status: Married  Catering manager Violence: Not At Risk (05/07/2022)   Received from [facility], [facility]   Humiliation, Afraid, Rape, and Kick questionnaire    Fear of Current or Ex-Partner: No    Emotionally Abused: No    Physically Abused: No    Sexually Abused: No    Family History: Family History  Problem Relation Age of Onset   Prostate cancer Brother    Congestive Heart Failure Mother    Heart failure Brother    Colon cancer Neg Hx     Current Medications:  Current Outpatient Medications:    cholecalciferol (VITAMIN D3) 25 MCG (1000 UNIT) tablet, Take 1,000 Units by mouth daily., Disp: , Rfl:    clotrimazole-betamethasone (LOTRISONE) cream, Apply topically 2 (two) times daily., Disp: , Rfl:    [employer] IRON 325 (65 Fe) MG tablet, Take 325 mg by mouth 2 (two) times daily., Disp: , Rfl:    diltiazem  (TIAZAC ) 360 MG 24 hr capsule, Take 1 capsule (360 mg total) by mouth daily., Disp: 30 capsule, Rfl: 5   furosemide (LASIX) 20 MG tablet, Take 20-40 mg by mouth daily as needed., Disp: , Rfl:    gabapentin  (NEURONTIN ) 100 MG capsule, Take 100 mg by mouth at bedtime., Disp: , Rfl:    Krill Oil 500 MG CAPS, Take 500 mg by mouth daily., Disp: , Rfl:    losartan (COZAAR) 100 MG tablet, Take 100 mg by mouth daily., Disp: , Rfl:     metFORMIN (GLUCOPHAGE) 500 MG tablet, Take 1,000 mg by mouth 2 (two) times daily., Disp: , Rfl:    milk thistle 175 MG tablet, Take 175 mg by mouth daily., Disp: , Rfl:    Multiple Vitamin (MULTIVITAMIN WITH MINERALS) TABS tablet, Take 1 tablet by mouth daily., Disp: , Rfl:    nadolol  (CORGARD ) 20 MG tablet, TAKE 1 TABLET BY MOUTH EVERY DAY, Disp: 90 tablet, Rfl: 3   pantoprazole  (PROTONIX ) 40 MG tablet, TAKE 1 TABLET BY MOUTH DAILY BEFORE BREAKFAST, Disp: 90 tablet, Rfl: 3   simvastatin  (ZOCOR ) 40 MG tablet, Take 40 mg by mouth every evening. , Disp: , Rfl:    tamsulosin (FLOMAX) 0.4 MG CAPS capsule, Take 0.4 mg by mouth daily., Disp: , Rfl:    Allergies: Allergies  Allergen Reactions   Rabbit Protein Swelling    Face swelled up   Hydrocodone-Acetaminophen  Rash   Other Swelling and Rash    Dial soap   Ivp Dye [Iodinated Contrast Media] Other (See Comments)    Possible allergy... patient can not confirm or deny contrast allergy, needs pre meds pre Dr. Abby Abbott (Melvine Julin Arlester Ladd 01/27/2023)    REVIEW OF SYSTEMS:   Review of Systems  Constitutional:  Negative for fatigue.  Respiratory:  Positive for shortness of breath.   Musculoskeletal:  Positive for arthralgias (Left Great Toe gout flare).     VITALS:   There were no vitals taken for this visit.  Wt Readings from Last 3 Encounters:  01/12/24 186 lb (84.4 kg)  12/31/23 188 lb 6.4 oz (85.5 kg)  10/09/23 189 lb 9.5 oz (86 kg)    There is no height or weight on file to calculate BMI.   PHYSICAL EXAM:   Physical Exam Constitutional:  Appearance: Normal appearance.  Cardiovascular:     Rate and Rhythm: Normal rate and regular rhythm.  Pulmonary:     Effort: Pulmonary effort is normal.     Breath sounds: Normal breath sounds.  Abdominal:     General: Bowel sounds are normal.     Palpations: Abdomen is soft.  Musculoskeletal:        General: No swelling. Normal range of motion.  Neurological:     Mental Status: He is alert  and oriented to person, place, and time. Mental status is at baseline.     LABS:      Latest Ref Rng & Units 01/29/2024    9:28 AM 10/02/2023    9:38 AM 04/29/2023   11:09 AM  CBC  WBC 4.0 - 10.5 K/uL 2.5  3.6  3.3   Hemoglobin 13.0 - 17.0 g/dL 9.7  9.4  9.0   Hematocrit 39.0 - 52.0 % 28.0  29.0  27.4   Platelets 150 - 400 K/uL 58  80  79       Latest Ref Rng & Units 01/29/2024    9:28 AM 12/15/2023   10:45 AM 10/02/2023    9:38 AM  CMP  Glucose 70 - 99 mg/dL 956  213  086   BUN 8 - 23 mg/dL 19  20  18    Creatinine 0.61 - 1.24 mg/dL 5.78  4.69  6.29   Sodium 135 - 145 mmol/L 135  139  135   Potassium 3.5 - 5.1 mmol/L 4.6  4.8  4.7   Chloride 98 - 111 mmol/L 103  103  104   CO2 22 - 32 mmol/L 24  20  24    Calcium  8.9 - 10.3 mg/dL 9.3  9.9  9.3   Total Protein 6.5 - 8.1 g/dL 6.4  6.5  6.3   Total Bilirubin 0.0 - 1.2 mg/dL 0.9  0.4  0.8   Alkaline Phos 38 - 126 U/L 67  106  68   AST 15 - 41 U/L 28  29  26    ALT 0 - 44 U/L 21  21  21       No results found for: "CEA1", "CEA" / No results found for: "CEA1", "CEA" No results found for: "PSA1" No results found for: "CAN199" No results found for: "CAN125"  Lab Results  Component Value Date   TOTALPROTELP 6.2 02/20/2023   TOTALPROTELP 6.1 02/20/2023   ALBUMINELP 3.6 02/20/2023   A1GS 0.2 02/20/2023   A2GS 0.8 02/20/2023   BETS 1.0 02/20/2023   GAMS 0.5 02/20/2023   MSPIKE Not Observed 02/20/2023   SPEI Comment 02/20/2023   Lab Results  Component Value Date   TIBC 417 01/29/2024   TIBC 416 10/02/2023   TIBC 380 04/29/2023   FERRITIN 28 01/29/2024   FERRITIN 29 10/02/2023   FERRITIN 37 06/26/2023   IRONPCTSAT 27 01/29/2024   IRONPCTSAT 27 10/02/2023   IRONPCTSAT 24 04/29/2023   Lab Results  Component Value Date   LDH 128 02/20/2023     STUDIES:   US  Abdomen Limited RUQ (LIVER/GB) Result Date: 01/26/2024 CLINICAL DATA:  Follow-up cirrhosis EXAM: ULTRASOUND ABDOMEN LIMITED RIGHT UPPER QUADRANT COMPARISON:   August 26, 2022 FINDINGS: Gallbladder: Prior cholecystectomy. Common bile duct: Diameter: 5 mm. Liver: No focal lesion. Increased echotexture. Mild nodular contour. Recanalized umbilical vein is noted. Portal vein is patent on color Doppler imaging with normal direction of blood flow towards the liver. Other: None. IMPRESSION: 1. Cirrhotic liver. No focal liver  lesion identified. Electronically Signed   By: Anna Barnes M.D.   On: 01/26/2024 10:29

## 2024-02-06 ENCOUNTER — Encounter: Payer: Self-pay | Admitting: Hematology

## 2024-02-09 LAB — SURGICAL PATHOLOGY

## 2024-02-15 LAB — FLOW CYTOMETRY

## 2024-02-18 ENCOUNTER — Inpatient Hospital Stay: Attending: Hematology | Admitting: Oncology

## 2024-02-18 DIAGNOSIS — K922 Gastrointestinal hemorrhage, unspecified: Secondary | ICD-10-CM | POA: Diagnosis present

## 2024-02-18 DIAGNOSIS — D61818 Other pancytopenia: Secondary | ICD-10-CM | POA: Insufficient documentation

## 2024-02-18 DIAGNOSIS — D500 Iron deficiency anemia secondary to blood loss (chronic): Secondary | ICD-10-CM | POA: Insufficient documentation

## 2024-02-18 NOTE — Progress Notes (Signed)
 Virtual Visit via Telephone Note  I connected with Derek Drake on 02/18/24 at  2:00 PM EDT by telephone and verified that I am speaking with the correct person using two identifiers.  Location: Patient: Home Provider: Clinic    I discussed the limitations, risks, security and privacy concerns of performing an evaluation and management service by telephone and the availability of in person appointments. I also discussed with the patient that there may be a patient responsible charge related to this service. The patient expressed understanding and agreed to proceed.   History of Present Illness: Patient is a 78 year old male who is here for follow-up for pancytopenia.  Labs from 01/29/24 showed a white blood cell count 2.5, hemoglobin 9.7 and a platelet count of 58.  Again change from previous.  Dr. Robynn Christian who recommended flow cytometry, folate level and B12 level.  We also added reticulocytes.  He is here to review results per chart.  Patient states today, he is doing great.  Appetite and energy levels are 75%.  He has intermittent shortness of breath, diarrhea and insomnia him.  He has been drinking more water  and feels more hydrated.  No new concerns.  He denies any pain.  Observations/Objective:Review of Systems  Respiratory:  Positive for shortness of breath.   Gastrointestinal:  Positive for diarrhea.  Psychiatric/Behavioral:  The patient has insomnia.    Physical Exam Neurological:     Mental Status: He is alert and oriented to person, place, and time.     Assessment and Plan: 1. Other pancytopenia (HCC) (Primary) Reviewed flow cytometry and other labs from 02/05/2024.  Flow cytometry did not reveal any abnormal B or T-cell population.  Folate level was WNL and B12 was elevated.  Reticulocyte count is normal.  Recommend repeat lab work in 3 weeks.  If labs continue to decline, would recommend bone marrow biopsy.  Follow Up Instructions: Labs in 3 weeks-if stable repeat in 3 more  weeks with office visit.    I discussed the assessment and treatment plan with the patient. The patient was provided an opportunity to ask questions and all were answered. The patient agreed with the plan and demonstrated an understanding of the instructions.   The patient was advised to call back or seek an in-person evaluation if the symptoms worsen or if the condition fails to improve as anticipated.  I provided 20 minutes of non-face-to-face time during this encounter.   Aurther Blue, NP

## 2024-02-19 ENCOUNTER — Inpatient Hospital Stay: Admitting: Oncology

## 2024-03-10 ENCOUNTER — Inpatient Hospital Stay

## 2024-03-23 ENCOUNTER — Other Ambulatory Visit: Payer: Self-pay

## 2024-03-23 DIAGNOSIS — D61818 Other pancytopenia: Secondary | ICD-10-CM

## 2024-03-24 ENCOUNTER — Inpatient Hospital Stay: Attending: Hematology

## 2024-03-24 DIAGNOSIS — D61818 Other pancytopenia: Secondary | ICD-10-CM | POA: Diagnosis present

## 2024-03-24 DIAGNOSIS — D500 Iron deficiency anemia secondary to blood loss (chronic): Secondary | ICD-10-CM | POA: Insufficient documentation

## 2024-03-24 DIAGNOSIS — K922 Gastrointestinal hemorrhage, unspecified: Secondary | ICD-10-CM | POA: Diagnosis present

## 2024-03-24 LAB — CBC WITH DIFFERENTIAL/PLATELET
Abs Immature Granulocytes: 0.04 K/uL (ref 0.00–0.07)
Basophils Absolute: 0 K/uL (ref 0.0–0.1)
Basophils Relative: 0 %
Eosinophils Absolute: 0.1 K/uL (ref 0.0–0.5)
Eosinophils Relative: 2 %
HCT: 26.4 % — ABNORMAL LOW (ref 39.0–52.0)
Hemoglobin: 8.5 g/dL — ABNORMAL LOW (ref 13.0–17.0)
Immature Granulocytes: 1 %
Lymphocytes Relative: 17 %
Lymphs Abs: 0.5 K/uL — ABNORMAL LOW (ref 0.7–4.0)
MCH: 32 pg (ref 26.0–34.0)
MCHC: 32.2 g/dL (ref 30.0–36.0)
MCV: 99.2 fL (ref 80.0–100.0)
Monocytes Absolute: 0.8 K/uL (ref 0.1–1.0)
Monocytes Relative: 25 %
Neutro Abs: 1.6 K/uL — ABNORMAL LOW (ref 1.7–7.7)
Neutrophils Relative %: 55 %
RBC: 2.66 MIL/uL — ABNORMAL LOW (ref 4.22–5.81)
RDW: 14.6 % (ref 11.5–15.5)
Smear Review: DECREASED
WBC: 3 K/uL — ABNORMAL LOW (ref 4.0–10.5)
nRBC: 0 % (ref 0.0–0.2)

## 2024-03-24 LAB — COMPREHENSIVE METABOLIC PANEL WITH GFR
ALT: 21 U/L (ref 0–44)
AST: 28 U/L (ref 15–41)
Albumin: 3.7 g/dL (ref 3.5–5.0)
Alkaline Phosphatase: 82 U/L (ref 38–126)
Anion gap: 12 (ref 5–15)
BUN: 21 mg/dL (ref 8–23)
CO2: 21 mmol/L — ABNORMAL LOW (ref 22–32)
Calcium: 9.4 mg/dL (ref 8.9–10.3)
Chloride: 105 mmol/L (ref 98–111)
Creatinine, Ser: 1.32 mg/dL — ABNORMAL HIGH (ref 0.61–1.24)
GFR, Estimated: 55 mL/min — ABNORMAL LOW (ref >60)
Glucose, Bld: 190 mg/dL — ABNORMAL HIGH (ref 70–99)
Potassium: 4.8 mmol/L (ref 3.5–5.1)
Sodium: 138 mmol/L (ref 135–145)
Total Bilirubin: 0.6 mg/dL (ref 0.0–1.2)
Total Protein: 6.2 g/dL — ABNORMAL LOW (ref 6.5–8.1)

## 2024-03-31 ENCOUNTER — Inpatient Hospital Stay: Admitting: Oncology

## 2024-03-31 VITALS — BP 127/73 | HR 73 | Temp 97.3°F | Resp 16 | Wt 183.6 lb

## 2024-03-31 DIAGNOSIS — D61818 Other pancytopenia: Secondary | ICD-10-CM | POA: Diagnosis not present

## 2024-03-31 DIAGNOSIS — D500 Iron deficiency anemia secondary to blood loss (chronic): Secondary | ICD-10-CM | POA: Diagnosis not present

## 2024-03-31 NOTE — Progress Notes (Signed)
       [facility] [address] [place], [place] [address] Main: [phone] Fax: [phone]  Follow-up Visit    Patient Care Team: Bertell Satterfield, MD as PCP - General (Internal Medicine) Debera Jayson MATSU, MD as PCP - Cardiology (Cardiology) Debera Jayson MATSU, MD as Consulting Physician (Cardiology) Shaaron Lamar HERO, MD as Consulting Physician (Gastroenterology) Rogers Hai, MD as Medical Oncologist (Hematology)   Name of the patient: Derek Drake  [id number]  12-Nov-1945   Date of visit: 03/31/2024   Diagnosis- 1. Other pancytopenia (HCC)   History of Present Illness: Patient is a 78 year old male who is here for follow-up for pancytopenia.  He has been closely monitored given his counts continue to drop slowly.  He is here today to review most recent lab work and to see if pancytopenia were to stabilize versus worsening warranting a bone marrow biopsy.  He is here to review his most recent results.  Reports overall doing well.  Appetite 75% energy level is 40%.  Denies any pain.  He has some shortness of breath with exertion, dizziness and headaches.  Occasional insomnia.  Denies any obvious bleeding, bright red blood per rectum or hematochezia.  Observations/Objective:Review of Systems  Constitutional:  Positive for malaise/fatigue.  Respiratory:  Positive for shortness of breath.   Neurological:  Positive for dizziness and headaches.  Psychiatric/Behavioral:  The patient has insomnia.    Physical Exam Constitutional:      Appearance: Normal appearance.  Cardiovascular:     Rate and Rhythm: Normal rate and regular rhythm.  Pulmonary:     Effort: Pulmonary effort is normal.     Breath sounds: Normal breath sounds.  Abdominal:     General: Bowel sounds are normal.     Palpations: Abdomen is soft.  Musculoskeletal:        General: No swelling. Normal range of motion.  Neurological:     Mental Status: He is alert and oriented to  person, place, and time. Mental status is at baseline.     Assessment and Plan: 1. Other pancytopenia (HCC) (Primary) Flow cytometry did not reveal any abnormal B or T-cell population.  Folate level was WNL and B12 was elevated.  Reticulocyte count is normal.   Labs from 03/24/2024 show white blood cell count 3.0 (2.5) hemoglobin continues to trend down and is 8.5 (10.7) and unsure if platelet count given there was clumping present.  We discussed in detail he was agreeable to a bone marrow biopsy.  Orders were placed.  Return to clinic 2 to 3 weeks following bone marrow biopsy for results.  Follow Up Instructions: Orders placed for bone marrow biopsy.  He will return to clinic shortly after to review results.   I discussed the assessment and treatment plan with the patient. The patient was provided an opportunity to ask questions and all were answered. The patient agreed with the plan and demonstrated an understanding of the instructions.   The patient was advised to call back or seek an in-person evaluation if the symptoms worsen or if the condition fails to improve as anticipated.  I provided 20 minutes of non-face-to-face time during this encounter.   Delon FORBES Hope, NP

## 2024-04-05 ENCOUNTER — Inpatient Hospital Stay

## 2024-04-05 VITALS — BP 114/51 | HR 58 | Temp 97.5°F | Resp 16 | Wt 183.2 lb

## 2024-04-05 DIAGNOSIS — D500 Iron deficiency anemia secondary to blood loss (chronic): Secondary | ICD-10-CM | POA: Diagnosis not present

## 2024-04-05 MED ORDER — CETIRIZINE HCL 10 MG/ML IV SOLN
10.0000 mg | Freq: Once | INTRAVENOUS | Status: AC
  Administered 2024-04-05: 10 mg via INTRAVENOUS
  Filled 2024-04-05: qty 1

## 2024-04-05 MED ORDER — SODIUM CHLORIDE 0.9 % IV SOLN: Freq: Once | INTRAVENOUS | Status: AC

## 2024-04-05 MED ORDER — ACETAMINOPHEN 325 MG PO TABS
650.0000 mg | ORAL_TABLET | Freq: Once | ORAL | Status: AC
  Administered 2024-04-05: 650 mg via ORAL
  Filled 2024-04-05: qty 2

## 2024-04-05 MED ORDER — FAMOTIDINE IN NACL 20-0.9 MG/50ML-% IV SOLN
20.0000 mg | Freq: Once | INTRAVENOUS | Status: AC
  Administered 2024-04-05: 20 mg via INTRAVENOUS
  Filled 2024-04-05: qty 50

## 2024-04-05 MED ORDER — SODIUM CHLORIDE 0.9 % IV SOLN
1000.0000 mg | Freq: Once | INTRAVENOUS | Status: AC
  Administered 2024-04-05: 1000 mg via INTRAVENOUS
  Filled 2024-04-05: qty 1000

## 2024-04-05 NOTE — Progress Notes (Signed)
Patient presents today for iron infusion. Patient is in satisfactory condition with no new complaints voiced.  Vital signs are stable.  We will proceed with infusion per provider orders.    Peripheral IV started with good blood return pre and post infusion.  Monoferric 1,000 mg given today per MD orders. Tolerated infusion without adverse affects. Vital signs stable. No complaints at this time. Discharged from clinic ambulatory in stable condition. Alert and oriented x 3. F/U with [facility] as scheduled.

## 2024-04-05 NOTE — Patient Instructions (Signed)
 [facility] [place] - A DEPT OF [facility]  Discharge Instructions: Thank you for choosing [facility] to provide your oncology and hematology care.  If you have a lab appointment with the Cancer Center - please note that after April 8th, 2024, all labs will be drawn in the cancer center.  You do not have to check in or register with the main entrance as you have in the past but will complete your check-in in the cancer center.  Wear comfortable clothing and clothing appropriate for easy access to any Portacath or PICC line.   We strive to give you quality time with your provider. You may need to reschedule your appointment if you arrive late (15 or more minutes).  Arriving late affects you and other patients whose appointments are after yours.  Also, if you miss three or more appointments without notifying the office, you may be dismissed from the clinic at the provider's discretion.      For prescription refill requests, have your pharmacy contact our office and allow 72 hours for refills to be completed.    Today you received Monoferric IV iron infusion.     BELOW ARE SYMPTOMS THAT SHOULD BE REPORTED IMMEDIATELY: *FEVER GREATER THAN 100.4 F (38 C) OR HIGHER *CHILLS OR SWEATING *NAUSEA AND VOMITING THAT IS NOT CONTROLLED WITH YOUR NAUSEA MEDICATION *UNUSUAL SHORTNESS OF BREATH *UNUSUAL BRUISING OR BLEEDING *URINARY PROBLEMS (pain or burning when urinating, or frequent urination) *BOWEL PROBLEMS (unusual diarrhea, constipation, pain near the anus) TENDERNESS IN MOUTH AND THROAT WITH OR WITHOUT PRESENCE OF ULCERS (sore throat, sores in mouth, or a toothache) UNUSUAL RASH, SWELLING OR PAIN  UNUSUAL VAGINAL DISCHARGE OR ITCHING   Items with * indicate a potential emergency and should be followed up as soon as possible or go to the Emergency Department if any problems should occur.  Please show the CHEMOTHERAPY ALERT CARD or IMMUNOTHERAPY ALERT CARD  at check-in to the Emergency Department and triage nurse.  Should you have questions after your visit or need to cancel or reschedule your appointment, please contact [facility] [place] - A DEPT OF Eligha Bridegroom [facility] [phone]  and follow the prompts.  Office hours are 8:00 a.m. to 4:30 p.m. Monday - Friday. Please note that voicemails left after 4:00 p.m. may not be returned until the following business day.  We are closed weekends and major holidays. You have access to a nurse at all times for urgent questions. Please call the main number to the clinic [phone] and follow the prompts.  For any non-urgent questions, you may also contact your provider using MyChart. We now offer e-Visits for anyone 35 and older to request care online for non-urgent symptoms. For details visit [email or web address].   Also download the MyChart app! Go to the app store, search "MyChart", open the app, select [place], and log in with your MyChart username and password.

## 2024-04-11 ENCOUNTER — Other Ambulatory Visit: Payer: Self-pay | Admitting: Cardiology

## 2024-04-11 DIAGNOSIS — I10 Essential (primary) hypertension: Secondary | ICD-10-CM

## 2024-04-11 DIAGNOSIS — I480 Paroxysmal atrial fibrillation: Secondary | ICD-10-CM

## 2024-04-13 ENCOUNTER — Encounter: Payer: Self-pay | Admitting: Hematology

## 2024-04-15 ENCOUNTER — Other Ambulatory Visit: Payer: Self-pay | Admitting: Radiology

## 2024-04-15 DIAGNOSIS — D500 Iron deficiency anemia secondary to blood loss (chronic): Secondary | ICD-10-CM

## 2024-04-16 NOTE — H&P (Signed)
 Chief Complaint: Pancytopenia; referred for image guided bone marrow biopsy for further evaluation  Referring Provider(s): Burns,J, NP  Supervising Physician: Philip Cornet  Patient Status: [facility] - Out-pt  History of Present Illness: Derek Drake is a 78 y.o. male ex smoker  with PMH sig for cirrhosis, HTN, GERD, HLD, PAF, DM who presents now with persistent pancytopenia of uncertain etiology. He is scheduled today for image guided bone marrow biopsy for further evaluation.   *** Patient is Full Code  Past Medical History:  Diagnosis Date   Cirrhosis (HCC)    Immune to Hep A. Received Hep B vaccination in 2022.   Essential hypertension    GERD (gastroesophageal reflux disease)    Hypercholesteremia    PAF (paroxysmal atrial fibrillation) (HCC) 05/04/2014   Type 2 diabetes mellitus [facility])     Past Surgical History:  Procedure Laterality Date   BIOPSY  02/05/2017   Procedure: BIOPSY;  Surgeon: Shaaron Lamar HERO, MD;  [facility];  Service: Endoscopy;;  gastric colon   CHOLECYSTECTOMY     COLONOSCOPY N/A 05/13/2013   Procedure: COLONOSCOPY;  Surgeon: Lamar HERO Shaaron, MD;  [facility];  Service: Endoscopy;  Laterality: N/A;  8:30 AM   COLONOSCOPY N/A 02/05/2017   Procedure: COLONOSCOPY;  Surgeon: Shaaron Lamar HERO, MD;  [facility];  Service: Endoscopy;  Laterality: N/A;   COLONOSCOPY WITH PROPOFOL  N/A 03/26/2022   Procedure: COLONOSCOPY WITH PROPOFOL ;  Surgeon: Shaaron Lamar HERO, MD;  [facility];  Service: Endoscopy;  Laterality: N/A;  7:30am, moved to 7/12 @ 2:45   COLONOSCOPY WITH PROPOFOL  N/A 04/14/2022   Procedure: COLONOSCOPY WITH PROPOFOL ;  Surgeon: Cindie Carlin POUR, DO;  [facility];  Service: Endoscopy;  Laterality: N/A;   ESOPHAGOGASTRODUODENOSCOPY N/A 02/05/2017   Procedure: ESOPHAGOGASTRODUODENOSCOPY (EGD);  Surgeon: Shaaron Lamar HERO, MD;  [facility];  Service: Endoscopy;  Laterality: N/A;  730 - moved  to 12:00    ESOPHAGOGASTRODUODENOSCOPY N/A 07/04/2020   Procedure: ESOPHAGOGASTRODUODENOSCOPY (EGD);  Surgeon: Shaaron Lamar HERO, MD;  [facility];  Service: Endoscopy;  Laterality: N/A;  12:45pm   ESOPHAGOGASTRODUODENOSCOPY (EGD) WITH PROPOFOL  N/A 01/23/2022   3 columns of grade 1-2 varices, portal gastropathy, normal duodenum. Begin Nadolol  20 mg daily.   GIVENS CAPSULE STUDY N/A 07/04/2020   Procedure: GIVENS CAPSULE STUDY;  Surgeon: Shaaron Lamar HERO, MD;  [facility];  Service: Endoscopy;  Laterality: N/A;  Elected not to place a capsule today   GIVENS CAPSULE STUDY N/A 04/12/2022   Procedure: GIVENS CAPSULE STUDY;  Surgeon: Eartha Angelia Sieving, MD;  [facility];  Service: Gastroenterology;  Laterality: N/A;   HOT HEMOSTASIS  03/26/2022   Procedure: HOT HEMOSTASIS (ARGON PLASMA COAGULATION/BICAP);  Surgeon: Shaaron Lamar HERO, MD;  [facility];  Service: Endoscopy;;   HOT HEMOSTASIS  04/14/2022   Procedure: HOT HEMOSTASIS (ARGON PLASMA COAGULATION/BICAP);  Surgeon: Cindie Carlin POUR, DO;  [facility];  Service: Endoscopy;;   POLYPECTOMY  02/05/2017   Procedure: POLYPECTOMY;  Surgeon: Shaaron Lamar HERO, MD;  [facility];  Service: Endoscopy;;  colon    Allergies: Rabbit protein, Hydrocodone-acetaminophen , Other, and Ivp dye [iodinated contrast media]  Medications: Prior to Admission medications   Medication Sig Start Date End Date Taking? Authorizing Provider  cholecalciferol (VITAMIN D3) 25 MCG (1000 UNIT) tablet Take 1,000 Units by mouth daily.    [provider]  clotrimazole-betamethasone (LOTRISONE) cream Apply  topically 2 (two) times daily. 04/24/23   [provider]  [employer] IRON 325 (65 Fe) MG tablet Take 325 mg by mouth 2 (two) times daily. 06/17/22   [provider]  diltiazem  (TIADYLT  ER) 360 MG 24 hr capsule TAKE 1 CAPSULE BY MOUTH EVERY DAY 04/12/24   Debera Jayson MATSU, MD  furosemide  (LASIX) 20 MG tablet Take 20-40 mg by mouth daily as needed. 06/11/23   [provider]  gabapentin  (NEURONTIN ) 100 MG capsule Take 100 mg by mouth at bedtime. 07/07/19   [provider]  Anselm Oil 500 MG CAPS Take 500 mg by mouth daily.    [provider]  losartan (COZAAR) 100 MG tablet Take 100 mg by mouth daily. 06/17/22   [provider]  metFORMIN (GLUCOPHAGE) 500 MG tablet Take 1,000 mg by mouth 2 (two) times daily. Patient taking differently: Take 1,000 mg by mouth 2 (two) times daily. Taking 2 at night and 1 in the morning 12/25/23   [provider]  milk thistle 175 MG tablet Take 175 mg by mouth daily.    [provider]  Multiple Vitamin (MULTIVITAMIN WITH MINERALS) TABS tablet Take 1 tablet by mouth daily.    [provider]  nadolol  (CORGARD ) 20 MG tablet TAKE 1 TABLET BY MOUTH EVERY DAY 01/04/24   Rourk, Lamar HERO, MD  pantoprazole  (PROTONIX ) 40 MG tablet TAKE 1 TABLET BY MOUTH DAILY BEFORE BREAKFAST 05/11/23   Ezzard Sonny RAMAN, PA-C  predniSONE  (DELTASONE ) 20 MG tablet Take 1 tablet (20 mg total) by mouth daily with breakfast. 02/05/24   Geofm Delon BRAVO, NP  simvastatin  (ZOCOR ) 40 MG tablet Take 40 mg by mouth every evening.     [provider]  tamsulosin (FLOMAX) 0.4 MG CAPS capsule Take 0.4 mg by mouth daily. 04/24/23   [provider]     Family History  Problem Relation Age of Onset   Prostate cancer Brother    Congestive Heart Failure Mother    Heart failure Brother    Colon cancer Neg Hx     Social History   Socioeconomic History   Marital status: Married    Spouse name: Not on file   Number of children: Not on file   Years of education: Not on file   Highest education level: Not on file  Occupational History   Not on file  Tobacco Use   Smoking status: Former    Current packs/day: 0.00    Average packs/day: 2.0 packs/day for 53.0 years (106.0 ttl pk-yrs)    Types: Cigarettes    Start  date: 05/25/1956    Quit date: 05/25/2009    Years since quitting: 14.9   Smokeless tobacco: Current    Types: Chew  Vaping Use   Vaping status: Never Used  Substance and Sexual Activity   Alcohol use: Not Currently   Drug use: No   Sexual activity: Yes  Other Topics Concern   Not on file  Social History Narrative   Not on file   Social Drivers of Health   Financial Resource Strain: Low Risk  (05/07/2022)   Received from [facility]   Overall Financial Resource Strain (CARDIA)    Difficulty of Paying Living Expenses: Not hard at all  Food Insecurity: No Food Insecurity (05/07/2022)   Received from [facility]   Hunger Vital Sign    Within the past 12 months, you worried that your food would run out before you got the money  to buy more.: Never true    Within the past 12 months, the food you bought just didn't last and you didn't have money to get more.: Never true  Transportation Needs: No Transportation Needs (05/07/2022)   Received from [facility] - Transportation    Lack of Transportation (Medical): No    Lack of Transportation (Non-Medical): No  Physical Activity: Inactive (05/07/2022)   Received from [facility]   Exercise Vital Sign    On average, how many days per week do you engage in moderate to strenuous exercise (like a brisk walk)?: 0 days    On average, how many minutes do you engage in exercise at this level?: 0 min  Stress: No Stress Concern Present (05/07/2022)   Received from [facility] - Occupational Stress Questionnaire    Feeling of Stress : Not at all  Social Connections: Socially Integrated (05/07/2022)   Received from [facility]   Social Connection and Isolation Panel    In a typical week, how many times do you talk on the phone with family, friends, or neighbors?: More than three times a week    How often do you get together with friends or relatives?: More than three times a  week    How often do you attend church or religious services?: More than 4 times per year    Do you belong to any clubs or organizations such as church groups, unions, fraternal or athletic groups, or school groups?: Yes    How often do you attend meetings of the clubs or organizations you belong to?: More than 4 times per year    Are you married, widowed, divorced, separated, never married, or living with a partner?: Married       Review of Systems  Vital Signs:   Advance Care Plan: no documents on file   Physical Exam  Imaging: No results found.  Labs:  CBC: Recent Labs    04/29/23 1109 10/02/23 0938 01/29/24 0928 03/24/24 1322  WBC 3.3* 3.6* 2.5* 3.0*  HGB 9.0* 9.4* 9.7* 8.5*  HCT 27.4* 29.0* 28.0* 26.4*  PLT 79* 80* 58* DCLMP    COAGS: Recent Labs    12/15/23 1045  INR 1.0    BMP: Recent Labs    10/02/23 0938 12/15/23 1045 01/29/24 0928 03/24/24 1322  NA 135 139 135 138  K 4.7 4.8 4.6 4.8  CL 104 103 103 105  CO2 24 20 24  21*  GLUCOSE 239* 248* 275* 190*  BUN 18 20 19 21   CALCIUM  9.3 9.9 9.3 9.4  CREATININE 1.29* 1.19 1.15 1.32*  GFRNONAA 57*  --  >60 55*    LIVER FUNCTION TESTS: Recent Labs    10/02/23 0938 12/15/23 1045 01/29/24 0928 03/24/24 1322  BILITOT 0.8 0.4 0.9 0.6  AST 26 29 28 28   ALT 21 21 21 21   ALKPHOS 68 106 67 82  PROT 6.3* 6.5 6.4* 6.2*  ALBUMIN 3.9 4.6 4.0 3.7    TUMOR MARKERS: No results for input(s): AFPTM, CEA, CA199, CHROMGRNA in the last 8760 hours.  Assessment and Plan: 78 y.o. male ex smoker  with PMH sig for cirrhosis, HTN, GERD, HLD, PAF, DM who presents now with persistent pancytopenia of uncertain etiology. He is scheduled today for image guided bone marrow biopsy for further evaluation. Risks and benefits of procedure was discussed with the patient including, but not limited to bleeding, infection, damage to  adjacent structures or low yield requiring additional tests.  All of the questions  were answered and there is agreement to proceed.  Consent signed and in chart.    Thank you for allowing our service to participate in Derek Drake 's care.  Electronically Signed: D. Franky Rakers, PA-C   04/16/2024, 5:42 PM      I spent a total of 15 minutes    in face to face in clinical consultation, greater than 50% of which was counseling/coordinating care for image guided bone marrow biopsy

## 2024-04-17 ENCOUNTER — Other Ambulatory Visit: Payer: Self-pay | Admitting: Student

## 2024-04-18 ENCOUNTER — Other Ambulatory Visit: Payer: Self-pay

## 2024-04-18 ENCOUNTER — Encounter (HOSPITAL_COMMUNITY): Payer: Self-pay

## 2024-04-18 ENCOUNTER — Other Ambulatory Visit: Payer: Self-pay | Admitting: Oncology

## 2024-04-18 ENCOUNTER — Ambulatory Visit (HOSPITAL_COMMUNITY)
Admission: RE | Admit: 2024-04-18 | Discharge: 2024-04-18 | Disposition: A | Discharge status: Home / Self Care | Source: Ambulatory Visit | Attending: Oncology | Admitting: Oncology

## 2024-04-18 DIAGNOSIS — I480 Paroxysmal atrial fibrillation: Secondary | ICD-10-CM | POA: Insufficient documentation

## 2024-04-18 DIAGNOSIS — I10 Essential (primary) hypertension: Secondary | ICD-10-CM | POA: Diagnosis not present

## 2024-04-18 DIAGNOSIS — D61818 Other pancytopenia: Secondary | ICD-10-CM | POA: Insufficient documentation

## 2024-04-18 DIAGNOSIS — K7460 Unspecified cirrhosis of liver: Secondary | ICD-10-CM | POA: Diagnosis not present

## 2024-04-18 DIAGNOSIS — E7800 Pure hypercholesterolemia, unspecified: Secondary | ICD-10-CM | POA: Diagnosis not present

## 2024-04-18 DIAGNOSIS — Z7984 Long term (current) use of oral hypoglycemic drugs: Secondary | ICD-10-CM | POA: Diagnosis not present

## 2024-04-18 DIAGNOSIS — E119 Type 2 diabetes mellitus without complications: Secondary | ICD-10-CM | POA: Insufficient documentation

## 2024-04-18 DIAGNOSIS — F17220 Nicotine dependence, chewing tobacco, uncomplicated: Secondary | ICD-10-CM | POA: Diagnosis not present

## 2024-04-18 DIAGNOSIS — D500 Iron deficiency anemia secondary to blood loss (chronic): Secondary | ICD-10-CM

## 2024-04-18 HISTORY — PX: IR BONE MARROW BIOPSY & ASPIRATION: IMG5727

## 2024-04-18 LAB — CBC WITH DIFFERENTIAL/PLATELET
Abs Granulocyte: 1.4 K/uL — ABNORMAL LOW (ref 1.5–6.5)
Abs Immature Granulocytes: 0.06 K/uL (ref 0.00–0.07)
Basophils Absolute: 0 K/uL (ref 0.0–0.1)
Basophils Relative: 0 %
Eosinophils Absolute: 0 K/uL (ref 0.0–0.5)
Eosinophils Relative: 2 %
HCT: 25.8 % — ABNORMAL LOW (ref 39.0–52.0)
Hemoglobin: 8.4 g/dL — ABNORMAL LOW (ref 13.0–17.0)
Immature Granulocytes: 2 %
Lymphocytes Relative: 18 %
Lymphs Abs: 0.5 K/uL — ABNORMAL LOW (ref 0.7–4.0)
MCH: 33.1 pg (ref 26.0–34.0)
MCHC: 32.6 g/dL (ref 30.0–36.0)
MCV: 101.6 fL — ABNORMAL HIGH (ref 80.0–100.0)
Monocytes Absolute: 0.5 K/uL (ref 0.1–1.0)
Monocytes Relative: 21 %
Neutro Abs: 1.4 K/uL — ABNORMAL LOW (ref 1.7–7.7)
Neutrophils Relative %: 57 %
Platelets: 60 K/uL — ABNORMAL LOW (ref 150–400)
RBC: 2.54 MIL/uL — ABNORMAL LOW (ref 4.22–5.81)
RDW: 15 % (ref 11.5–15.5)
WBC: 2.5 K/uL — ABNORMAL LOW (ref 4.0–10.5)
nRBC: 0 % (ref 0.0–0.2)

## 2024-04-18 MED ORDER — MIDAZOLAM HCL 2 MG/2ML IJ SOLN
INTRAMUSCULAR | Status: AC
  Filled 2024-04-18: qty 2

## 2024-04-18 MED ORDER — MIDAZOLAM HCL 2 MG/2ML IJ SOLN
INTRAMUSCULAR | Status: AC | PRN
Start: 2024-04-18 — End: 2024-04-18
  Administered 2024-04-18: 1 mg via INTRAVENOUS

## 2024-04-18 MED ORDER — LIDOCAINE HCL (PF) 1 % IJ SOLN
INTRAMUSCULAR | Status: AC
Start: 2024-04-18 — End: 2024-04-18
  Filled 2024-04-18: qty 30

## 2024-04-18 MED ORDER — FENTANYL CITRATE (PF) 100 MCG/2ML IJ SOLN
INTRAMUSCULAR | Status: AC | PRN
  Administered 2024-04-18: 50 ug via INTRAVENOUS

## 2024-04-18 MED ORDER — FENTANYL CITRATE (PF) 100 MCG/2ML IJ SOLN
INTRAMUSCULAR | Status: AC
Start: 2024-04-18 — End: 2024-04-18
  Filled 2024-04-18: qty 2

## 2024-04-18 MED ORDER — SODIUM CHLORIDE 0.9 % IV SOLN: INTRAVENOUS | Status: DC

## 2024-04-18 MED ORDER — LIDOCAINE HCL (PF) 1 % IJ SOLN
20.0000 mL | Freq: Once | INTRAMUSCULAR | Status: AC
  Administered 2024-04-18: 10 mL via INTRADERMAL

## 2024-04-18 NOTE — Procedures (Signed)
 Interventional Radiology Procedure:   Indications: Pancytopenia  Procedure: Image guided bone marrow biopsy  Findings: 2 aspirates and 1 core from right ilium  Complications: None     EBL: Minimal, less than 10 ml  Plan: Discharge to home in one hour.   Tonnette Zwiebel R. Julietta Ogren, MD  Pager: [phone]

## 2024-04-18 NOTE — Discharge Instructions (Signed)
 Bone Marrow Aspiration and Bone Marrow Biopsy, Adult, Care After  The following information offers guidance on how to care for yourself after your procedure. Your health care provider may also give you more specific instructions. If you have problems or questions, contact your health care provider.  What can I expect after the procedure?  May remove dressing or bandaid and shower tomorrow.  Keep site clean and dry. Replace with clean dressing or bandaid as necessary. Urgent needs IR clinic [phone] (mon-fri 8-5).  After the procedure, it is common to have: Mild pain and tenderness. Swelling. Bruising. Follow these instructions at home: Incision care  Follow instructions from your health care provider about how to take care of the incision site. Make sure you: Wash your hands with soap and water for at least 20 seconds before and after you change your bandage (dressing). If soap and water are not available, use hand sanitizer. Change your dressing as told by your health care provider. Leave stitches (sutures), skin glue, or adhesive strips in place. These skin closures may need to stay in place for 2 weeks or longer. If adhesive strip edges start to loosen and curl up, you may trim the loose edges. Do not remove adhesive strips completely unless your health care provider tells you to do that. Check your incision site every day for signs of infection. Check for: More redness, swelling, or pain. Fluid or blood. Warmth. Pus or a bad smell. Activity Return to your normal activities as told by your health care provider. Ask your health care provider what activities are safe for you. Do not lift anything that is heavier than 10 lb (4.5 kg), or the limit that you are told, until your health care provider says that it is safe. If you were given a sedative during the procedure, it can affect you for several hours. Do not drive or operate machinery until your health care provider says that it is  safe. General instructions  Take over-the-counter and prescription medicines only as told by your health care provider. Do not take baths, swim, or use a hot tub until your health care provider approves. Ask your health care provider if you may take showers. You may only be allowed to take sponge baths. If directed, put ice on the affected area. To do this: Put ice in a plastic bag. Place a towel between your skin and the bag. Leave the ice on for 20 minutes, 2-3 times a day. If your skin turns bright red, remove the ice right away to prevent skin damage. The risk of skin damage is higher if you cannot feel pain, heat, or cold. Contact a health care provider if: You have signs of infection. Your pain is not controlled with medicine. You have cancer, and a temperature of 100.22F (38C) or higher. Get help right away if: You have a temperature of 101F (38.3C) or higher, or as told by your health care provider. You have bleeding from the incision site that cannot be controlled. This information is not intended to replace advice given to you by your health care provider. Make sure you discuss any questions you have with your health care provider. Document Revised: 01/06/2022 Document Reviewed: 01/06/2022 Elsevier Patient Education  2023 Elsevier Inc.     Moderate Conscious Sedation, Adult, Care After  This sheet gives you information about how to care for yourself after your procedure. Your health care provider may also give you more specific instructions. If you have problems or questions, contact  your health care provider. What can I expect after the procedure? After the procedure, it is common to have: Sleepiness for several hours. Impaired judgment for several hours. Difficulty with balance. Vomiting if you eat too soon. Follow these instructions at home: For the time period you were told by your health care provider:     Rest. Do not participate in activities where you  could fall or become injured. Do not drive or use machinery. Do not drink alcohol. Do not take sleeping pills or medicines that cause drowsiness. Do not make important decisions or sign legal documents. Do not take care of children on your own. Eating and drinking  Follow the diet recommended by your health care provider. Drink enough fluid to keep your urine pale yellow. If you vomit: Drink water, juice, or soup when you can drink without vomiting. Make sure you have little or no nausea before eating solid foods. General instructions Take over-the-counter and prescription medicines only as told by your health care provider. Have a responsible adult stay with you for the time you are told. It is important to have someone help care for you until you are awake and alert. Do not smoke. Keep all follow-up visits as told by your health care provider. This is important. Contact a health care provider if: You are still sleepy or having trouble with balance after 24 hours. You feel light-headed. You keep feeling nauseous or you keep vomiting. You develop a rash. You have a fever. You have redness or swelling around the IV site. Get help right away if: You have trouble breathing. You have new-onset confusion at home. Summary After the procedure, it is common to feel sleepy, have impaired judgment, or feel nauseous if you eat too soon. Rest after you get home. Know the things you should not do after the procedure. Follow the diet recommended by your health care provider and drink enough fluid to keep your urine pale yellow. Get help right away if you have trouble breathing or new-onset confusion at home. This information is not intended to replace advice given to you by your health care provider. Make sure you discuss any questions you have with your health care provider. Document Revised: 12/30/2019 Document Reviewed: 07/28/2019 Elsevier Patient Education  2023 [employer].

## 2024-04-20 LAB — GLUCOSE, CAPILLARY: Glucose-Capillary: 138 mg/dL — ABNORMAL HIGH (ref 70–99)

## 2024-04-21 LAB — SURGICAL PATHOLOGY

## 2024-04-25 ENCOUNTER — Ambulatory Visit: Payer: Self-pay | Admitting: Oncology

## 2024-04-26 ENCOUNTER — Encounter (HOSPITAL_COMMUNITY): Payer: Self-pay | Admitting: Hematology

## 2024-05-04 ENCOUNTER — Inpatient Hospital Stay

## 2024-05-04 ENCOUNTER — Inpatient Hospital Stay: Attending: Hematology | Admitting: Oncology

## 2024-05-04 VITALS — BP 133/61 | HR 66 | Temp 97.5°F | Resp 16 | Wt 185.2 lb

## 2024-05-04 DIAGNOSIS — D61818 Other pancytopenia: Secondary | ICD-10-CM | POA: Diagnosis not present

## 2024-05-04 DIAGNOSIS — D500 Iron deficiency anemia secondary to blood loss (chronic): Secondary | ICD-10-CM | POA: Insufficient documentation

## 2024-05-04 DIAGNOSIS — K922 Gastrointestinal hemorrhage, unspecified: Secondary | ICD-10-CM | POA: Insufficient documentation

## 2024-05-04 DIAGNOSIS — K7460 Unspecified cirrhosis of liver: Secondary | ICD-10-CM | POA: Diagnosis not present

## 2024-05-04 DIAGNOSIS — D649 Anemia, unspecified: Secondary | ICD-10-CM

## 2024-05-04 LAB — CBC WITH DIFFERENTIAL/PLATELET
Abs Immature Granulocytes: 0.02 K/uL (ref 0.00–0.07)
Basophils Absolute: 0 K/uL (ref 0.0–0.1)
Basophils Relative: 0 %
Eosinophils Absolute: 0.1 K/uL (ref 0.0–0.5)
Eosinophils Relative: 2 %
HCT: 27.4 % — ABNORMAL LOW (ref 39.0–52.0)
Hemoglobin: 9.1 g/dL — ABNORMAL LOW (ref 13.0–17.0)
Immature Granulocytes: 1 %
Lymphocytes Relative: 21 %
Lymphs Abs: 0.6 K/uL — ABNORMAL LOW (ref 0.7–4.0)
MCH: 33.3 pg (ref 26.0–34.0)
MCHC: 33.2 g/dL (ref 30.0–36.0)
MCV: 100.4 fL — ABNORMAL HIGH (ref 80.0–100.0)
Monocytes Absolute: 0.6 K/uL (ref 0.1–1.0)
Monocytes Relative: 22 %
Neutro Abs: 1.4 K/uL — ABNORMAL LOW (ref 1.7–7.7)
Neutrophils Relative %: 54 %
Platelets: 61 K/uL — ABNORMAL LOW (ref 150–400)
RBC: 2.73 MIL/uL — ABNORMAL LOW (ref 4.22–5.81)
RDW: 14.5 % (ref 11.5–15.5)
WBC: 2.6 K/uL — ABNORMAL LOW (ref 4.0–10.5)
nRBC: 0 % (ref 0.0–0.2)

## 2024-05-04 LAB — FERRITIN: Ferritin: 76 ng/mL (ref 24–336)

## 2024-05-04 LAB — IRON AND TIBC
Iron: 82 ug/dL (ref 45–182)
Saturation Ratios: 23 % (ref 17.9–39.5)
TIBC: 352 ug/dL (ref 250–450)
UIBC: 270 ug/dL

## 2024-05-04 NOTE — Progress Notes (Unsigned)
   [facility] OFFICE PROGRESS NOTE  Bertell Satterfield, MD  ASSESSMENT & PLAN:  Assessment & Plan Other pancytopenia (HCC) Flow cytometry did not reveal any abnormal B or T-cell population.  Folate level was WNL and B12 was elevated.  Reticulocyte count is normal.   Labs from 03/24/2024 show white blood cell count 3.0 (2.5) hemoglobin continues to trend down and is 8.5 (10.7) and unsure if platelet count given there was clumping present.  Patient had bone marrow biopsy-  - No abnormal immature population identified  - No abnormal B or T-cell population identified   The patient's history of pancytopenia is noted.  Morphologic  evaluation reveals a hypercellular bone marrow with trilineage  hematopoiesis.  While mild dyspoiesis is noted, it does not reach the  threshold dysplasia.  Blasts are not increased.  Flow cytometric  analysis did not identify abnormal immature population, or a clonal B or  T-cell population.  A myeloid NGS panel may be requested if clinically  indicated.  Correlation with pending cytogenetics is recommended for  further assessment.   Iron deficiency anemia due to chronic blood loss  Anemia, unspecified type     Orders Placed This Encounter  Procedures   CBC with Differential    Standing Status:   Future    Expected Date:   05/04/2024    Expiration Date:   05/04/2025   Miscellaneous [facility] test (send-out)    Standing Status:   Future    Expected Date:   05/04/2024    Expiration Date:   05/04/2025    Test name / description::   Myeloid NGS  test [id number]   Ferritin    Standing Status:   Future    Expiration Date:   05/04/2025   Comprehensive metabolic panel    Standing Status:   Future    Expected Date:   08/24/2024    Expiration Date:   05/04/2025    INTERVAL HISTORY: Patient returns for recurrent anemia Symptoms of anemia includes {Symptoms; anemia:12350} We reviewed ***  SUMMARY OF HEMATOLOGIC HISTORY: ***  No results found for:  CBC  Vitals:   05/04/24 1254  BP: 133/61  Pulse: 66  Resp: 16  Temp: (!) 97.5 F (36.4 C)  SpO2: 100%    I spent *** minutes dedicated to the care of this patient (face-to-face and non-face-to-face) on the date of the encounter to include what is described in the assessment and plan.,  Delon Hope, NP 05/04/2024 1:27 PM

## 2024-05-04 NOTE — Assessment & Plan Note (Addendum)
 Flow cytometry did not reveal any abnormal B or T-cell population.  Folate level was WNL and B12 was elevated.  Reticulocyte count is normal.   Labs from 03/24/2024 show white blood cell count 3.0 (2.5) hemoglobin continues to trend down and is 8.5 (10.7) and unsure if platelet count given there was clumping present.  Patient had bone marrow biopsy-  - No abnormal immature population identified  - No abnormal B or T-cell population identified   The patient's history of pancytopenia is noted.  Morphologic  evaluation reveals a hypercellular bone marrow with trilineage  hematopoiesis.  While mild dyspoiesis is noted, it does not reach the  threshold dysplasia.  Blasts are not increased.  Flow cytometric  analysis did not identify abnormal immature population, or a clonal B or  T-cell population.  A myeloid NGS panel may be requested if clinically  indicated.  Correlation with pending cytogenetics is recommended for  further assessment.

## 2024-05-05 ENCOUNTER — Encounter: Payer: Self-pay | Admitting: Oncology

## 2024-05-05 NOTE — Assessment & Plan Note (Signed)
-   Hemoglobin has fluctuated over the years with counts as low as 6.5- normal.  -Most recently he has been between 8-9.  -Previous workup did not reveal any underlying blood dyscrasia or hemolytic anemia. -No evidence of active bleed per patient, although suspect slow GI bleed from known AVMs.  He is followed closely by GI. -He was given 1 g of Monoferric  on 03/27/2023, 05/15/2023, 07/22/2023, 10/14/2023 and 04/05/2024. -He is currently taking iron supplements twice daily with good tolerance. -Lab work from today shows a hemoglobin 9.1 (8.4), ferritin 76 (28) and iron saturation 23%.  TIBC 352. -He does not need any additional IV iron at this time.

## 2024-05-12 ENCOUNTER — Encounter (HOSPITAL_COMMUNITY): Payer: Self-pay | Admitting: Hematology

## 2024-05-20 ENCOUNTER — Encounter (HOSPITAL_COMMUNITY): Payer: Self-pay | Admitting: Hematology

## 2024-05-22 LAB — MISC LABCORP TEST (SEND OUT): Labcorp test code: 452312

## 2024-05-25 ENCOUNTER — Other Ambulatory Visit: Payer: Self-pay | Admitting: Gastroenterology

## 2024-05-26 ENCOUNTER — Encounter: Payer: Self-pay | Admitting: Internal Medicine

## 2024-06-21 ENCOUNTER — Ambulatory Visit: Admitting: Gastroenterology

## 2024-06-21 ENCOUNTER — Encounter: Payer: Self-pay | Admitting: *Deleted

## 2024-06-21 ENCOUNTER — Encounter: Payer: Self-pay | Admitting: Internal Medicine

## 2024-06-21 ENCOUNTER — Other Ambulatory Visit: Payer: Self-pay | Admitting: Gastroenterology

## 2024-06-21 VITALS — BP 162/71 | HR 66 | Temp 97.9°F | Ht 71.0 in | Wt 186.2 lb

## 2024-06-21 DIAGNOSIS — K7469 Other cirrhosis of liver: Secondary | ICD-10-CM

## 2024-06-21 DIAGNOSIS — K7460 Unspecified cirrhosis of liver: Secondary | ICD-10-CM

## 2024-06-21 DIAGNOSIS — I8510 Secondary esophageal varices without bleeding: Secondary | ICD-10-CM

## 2024-06-21 DIAGNOSIS — K219 Gastro-esophageal reflux disease without esophagitis: Secondary | ICD-10-CM | POA: Diagnosis not present

## 2024-06-21 DIAGNOSIS — D500 Iron deficiency anemia secondary to blood loss (chronic): Secondary | ICD-10-CM

## 2024-06-21 DIAGNOSIS — K7581 Nonalcoholic steatohepatitis (NASH): Secondary | ICD-10-CM | POA: Diagnosis not present

## 2024-06-21 MED ORDER — NADOLOL 20 MG PO TABS: 20.0000 mg | ORAL_TABLET | Freq: Every day | ORAL | 3 refills | Status: AC

## 2024-06-21 NOTE — Patient Instructions (Addendum)
 Please have blood work completed at [facility].  We will call you with results once they have been received. Please allow 3-5 business days for review. 2 locations for [facility] in [place]:              1. [address], [place]              2. [address] [place]   Cirrhosis Lifestyle Recommendations:  High-protein diet from a primarily plant-based diet. Avoid red meat.  No raw or undercooked meat, seafood, or shellfish. Low-fat/cholesterol/carbohydrate diet. Limit sodium to no more than 2000 mg/day including everything that you eat and drink. Recommend at least 30 minutes of aerobic and resistance exercise 3 days/week. Limit Tylenol  to 2000 mg daily.   We will need to get your right upper quadrant ultrasound scheduled next month.   I refilled your nadolol  today to [facility]  Your pantoprazole  was sent in last week and you have 3 remaining refills on that.  It was a pleasure to see you today. I want to create trusting relationships with patients. If you receive a survey regarding your visit,  I greatly appreciate you taking time to fill this out on paper or through your MyChart. I value your feedback.  Charmaine Melia, MSN, FNP-BC, AGACNP-BC [facility]

## 2024-06-21 NOTE — Progress Notes (Signed)
 GI Office Note    Referring Provider: Bertell Satterfield, MD Primary Care Physician:  Bertell Satterfield, MD Primary Gastroenterologist: Lamar HERO.Rourk, MD  Date:  06/21/2024  ID:  Derek Drake, DOB 1946/06/24, MRN [id number]   Chief Complaint   Chief Complaint  Patient presents with   Follow-up   History of Present Illness  Derek Drake is a 78 y.o. male with a history of MASH cirrhosis, IDA secondary to portal gastropathy, GERD, GI AVMs, type 2 diabetes, GERD, HTN, HLD, and paroxysmal A-fib presenting today for 25-month follow-up of cirrhosis.  Prior GI workup:  Patient had recent heme-negative stool and June 2023.  He has had downtrending hemoglobin since May 2023.  In May his hemoglobin was 8.2, previously 13.1 in November 2021.  Dr. 7.3 on 6/13 and then again to 6.6 on 6/27.  Most recent iron panel on 6/13 with iron 28, saturation 6%, ferritin 10.  During his last video visit on 03/03/2022 continue to complain of fatigue that is intermittent and nonprogressive.  He also denied abdominal pain, confusion, jaundice, mental status changes, or overt GI bleeding.   Colonoscopy 2018: One 4 mm polyp with benign mucosa in the descending colon, mucosal ulceration s/p biopsy, ileal erosions (Path revealed focal erosion).   EGD May 2023: 3 columns of grade 1-2 varices, portal gastropathy, normal duodenum and was started on nadolol .   Follow-up visit on 03/20/2022 patient reported feeling better after receiving blood transfusion and denies shortness of breath, weakness, presyncope, chest pain, palpitations or any overt GI bleeding.  Patient did report dark stools since being on iron.  He reported stable BPs at home on nadolol .  Colonoscopy date moved up.   Colonoscopy 03/26/2022: 5 mm AVM in the base of the cecum found to be actively oozing and was treated with APC therapy and clip placed with good hemostasis, 8 mm innocent appearing AVM in the cecum as well also treated with APC therapy and clipped,  relatively prominent vascular pattern throughout the colon, 30 cm of terminal ileum appeared normal.  Hemoglobin 8.4 on day of procedure.  Colonoscopy 04/14/2022: - Non- bleeding internal hemorrhoids.  - A single ( solitary) ulcer in the cecum.  - Five non- bleeding colonic angiodysplastic lesions. Treated with (APC) .  Last office visit 01/12/24 with Dr. Shaaron.  Had noticed some weight gain, having some lower extremity edema for which he was taking Lasix for.  Suspected salt intake was far exceeding guidelines, he notes he salts everything on his feet when he eats.  His weight was up 6 pounds since his prior office visit.  Having good blood sugar control on metformin.  Noted some decreased hemoglobin levels and pancytopenia in January with hemoglobin 9.4, platelets 80.  Iron studies in January with iron sat 27%, iron 111.  Known esophageal varices previously.  On NSBB. MELD labs ordered as well as ultrasound.  Advised 2 g sodium diet, discussed at length with him.  Advise office visit in 6 months.     Latest Ref Rng & Units 05/04/2024    1:33 PM 04/18/2024    9:03 AM 03/24/2024    1:22 PM 01/29/2024    9:28 AM 10/02/2023    9:38 AM 04/29/2023   11:09 AM  CBC  WBC 4.0 - 10.5 K/uL 2.6  2.5  3.0  2.5  3.6  3.3   Hemoglobin 13.0 - 17.0 g/dL 9.1  8.4  8.5  9.7  9.4  9.0   Hematocrit 39.0 - 52.0 %  27.4  25.8  26.4  28.0  29.0  27.4   Platelets 150 - 400 K/uL 61  60  DCLMP  58  80  79       Latest Ref Rng & Units 03/24/2024    1:22 PM 01/29/2024    9:28 AM 12/15/2023   10:45 AM  Hepatic Function  Total Protein 6.5 - 8.1 g/dL 6.2  6.4  6.5   Albumin 3.5 - 5.0 g/dL 3.7  4.0  4.6   AST 15 - 41 U/L 28  28  29    ALT 0 - 44 U/L 21  21  21    Alk Phosphatase 38 - 126 U/L 82  67  106   Total Bilirubin 0.0 - 1.2 mg/dL 0.6  0.9  0.4     Today:  Discussed the use of AI scribe software for clinical note transcription with the patient, who gave verbal consent to proceed.  MELD 3.0: 8 at 12/15/2023 10:45  AM MELD-Na: 8 at 12/15/2023 10:45 AM Calculated from: Serum Creatinine: 1.19 mg/dL at 01/20/7973 89:54 AM Serum Sodium: 139 mmol/L (Using max of 137 mmol/L) at 12/15/2023 10:45 AM Total Bilirubin: 0.4 mg/dL (Using min of 1 mg/dL) at 01/20/7973 89:54 AM Serum Albumin: 4.6 g/dL (Using max of 3.5 g/dL) at 01/20/7973 89:54 AM INR(ratio): 1 at 12/15/2023 10:45 AM Age at listing (hypothetical): 83 years Sex: Male at 12/15/2023 10:45 AM  US : May 2025 -mild nodular contour, normal portal flow.  No focal liver lesion AFP: DUE Hep A/B vaccination: Immune to hepatitis A, received hepatitis B vaccination 2022 EGD:  May 2023 with grade 1/2 esophageal varices and PHG BB: Currently on nadolol , heart rate: 66 Ascites/peripheral edema: none currently.  Diuretics: Lasix 20mg  - only took as needed.  Paracentesis: none History of SBP: none Encephalopathy:  none   He has a history of esophageal varices and is currently taking nadolol  for heart rate management. He reports occasional episodes of palpitations that resolve spontaneously. No recent issues with heart rate at home. He denies any recent bleeding or melena, although his stools are dark due to iron supplementation.  He has a history of shortness of breath, which was previously severe enough to limit his ability to walk. An iron infusion in July improved his symptoms. He occasionally experiences mild shortness of breath but is unsure of the cause, possible his Afib. He is not currently taking furosemide, which he used for a few months in the past, no current peripheral edema.  He is managing his diet by reducing salt intake, although he finds it challenging. He is taking pantoprazole  for acid reflux, which is effectively managing his symptoms. No trouble swallowing, nausea, vomiting, or breakthrough symptoms.  Regarding diabetes management, he was switched from metformin to glipizide due to taste issues. However, he found glipizide ineffective in controlling his  blood sugar, so he resumed taking metformin at night and glipizide in the morning. He monitors his blood sugar at home, which he reports as stable.  He takes iron supplements, which can darken his stool. No blood in stool. His bowel movements are regular, typically occurring in the morning after breakfast.       Wt Readings from Last 5 Encounters:  06/21/24 186 lb 3.2 oz (84.5 kg)  05/04/24 185 lb 3 oz (84 kg)  04/18/24 182 lb (82.6 kg)  04/05/24 183 lb 3.2 oz (83.1 kg)  03/31/24 183 lb 10.3 oz (83.3 kg)    Current Outpatient Medications  Medication Sig Dispense Refill  cholecalciferol (VITAMIN D3) 25 MCG (1000 UNIT) tablet Take 1,000 Units by mouth daily.     clotrimazole-betamethasone (LOTRISONE) cream Apply topically 2 (two) times daily.     [facility] IRON 325 (65 Fe) MG tablet Take 325 mg by mouth 2 (two) times daily.     diltiazem  (TIADYLT  ER) 360 MG 24 hr capsule TAKE 1 CAPSULE BY MOUTH EVERY DAY 30 capsule 5   furosemide (LASIX) 20 MG tablet Take 20-40 mg by mouth daily as needed.     gabapentin  (NEURONTIN ) 100 MG capsule Take 100 mg by mouth at bedtime.     glimepiride (AMARYL) 4 MG tablet Take 4 mg by mouth daily.     Krill Oil 500 MG CAPS Take 500 mg by mouth daily.     losartan (COZAAR) 100 MG tablet Take 100 mg by mouth daily.     metFORMIN (GLUCOPHAGE) 500 MG tablet Take 1,000 mg by mouth 2 (two) times daily. (Patient taking differently: Take 1,000 mg by mouth 2 (two) times daily. Taking 2 at night and 1 in the morning)     milk thistle 175 MG tablet Take 175 mg by mouth daily.     Multiple Vitamin (MULTIVITAMIN WITH MINERALS) TABS tablet Take 1 tablet by mouth daily.     nadolol  (CORGARD ) 20 MG tablet TAKE 1 TABLET BY MOUTH EVERY DAY 90 tablet 3   pantoprazole  (PROTONIX ) 40 MG tablet TAKE 1 TABLET BY MOUTH EVERY DAY BEFORE BREAKFAST 90 tablet 3   predniSONE  (DELTASONE ) 20 MG tablet Take 1 tablet (20 mg total) by mouth daily with breakfast. 4 tablet 0   simvastatin  (ZOCOR ) 40  MG tablet Take 40 mg by mouth every evening.      tamsulosin (FLOMAX) 0.4 MG CAPS capsule Take 0.4 mg by mouth daily.     No current facility-administered medications for this visit.    Past Medical History:  Diagnosis Date   Cirrhosis (HCC)    Immune to Hep A. Received Hep B vaccination in 2022.   Essential hypertension    GERD (gastroesophageal reflux disease)    Hypercholesteremia    PAF (paroxysmal atrial fibrillation) (HCC) 05/04/2014   Type 2 diabetes mellitus [facility])     Past Surgical History:  Procedure Laterality Date   BIOPSY  02/05/2017   Procedure: BIOPSY;  Surgeon: Shaaron Lamar HERO, MD;  [facility];  Service: Endoscopy;;  gastric colon   CHOLECYSTECTOMY     COLONOSCOPY N/A 05/13/2013   Procedure: COLONOSCOPY;  Surgeon: Lamar HERO Shaaron, MD;  [facility];  Service: Endoscopy;  Laterality: N/A;  8:30 AM   COLONOSCOPY N/A 02/05/2017   Procedure: COLONOSCOPY;  Surgeon: Shaaron Lamar HERO, MD;  [facility];  Service: Endoscopy;  Laterality: N/A;   COLONOSCOPY WITH PROPOFOL  N/A 03/26/2022   Procedure: COLONOSCOPY WITH PROPOFOL ;  Surgeon: Shaaron Lamar HERO, MD;  [facility];  Service: Endoscopy;  Laterality: N/A;  7:30am, moved to 7/12 @ 2:45   COLONOSCOPY WITH PROPOFOL  N/A 04/14/2022   Procedure: COLONOSCOPY WITH PROPOFOL ;  Surgeon: Cindie Carlin POUR, DO;  [facility];  Service: Endoscopy;  Laterality: N/A;   ESOPHAGOGASTRODUODENOSCOPY N/A 02/05/2017   Procedure: ESOPHAGOGASTRODUODENOSCOPY (EGD);  Surgeon: Shaaron Lamar HERO, MD;  [facility];  Service: Endoscopy;  Laterality: N/A;  730 - moved to 12:00    ESOPHAGOGASTRODUODENOSCOPY N/A 07/04/2020   Procedure: ESOPHAGOGASTRODUODENOSCOPY (EGD);  Surgeon: Shaaron Lamar HERO, MD;  [facility];  Service: Endoscopy;  Laterality: N/A;  12:45pm   ESOPHAGOGASTRODUODENOSCOPY (EGD) WITH PROPOFOL  N/A 01/23/2022   3 columns of grade 1-2 varices, portal gastropathy,  normal duodenum. Begin Nadolol  20 mg daily.   GIVENS CAPSULE STUDY N/A 07/04/2020   Procedure: GIVENS CAPSULE STUDY;  Surgeon: Shaaron Lamar HERO, MD;  [facility];  Service: Endoscopy;  Laterality: N/A;  Elected not to place a capsule today   GIVENS CAPSULE STUDY N/A 04/12/2022   Procedure: GIVENS CAPSULE STUDY;  Surgeon: Eartha Angelia Sieving, MD;  [facility];  Service: Gastroenterology;  Laterality: N/A;   HOT HEMOSTASIS  03/26/2022   Procedure: HOT HEMOSTASIS (ARGON PLASMA COAGULATION/BICAP);  Surgeon: Shaaron Lamar HERO, MD;  [facility];  Service: Endoscopy;;   HOT HEMOSTASIS  04/14/2022   Procedure: HOT HEMOSTASIS (ARGON PLASMA COAGULATION/BICAP);  Surgeon: Cindie Carlin POUR, DO;  [facility];  Service: Endoscopy;;   IR BONE MARROW BIOPSY & ASPIRATION  04/18/2024   POLYPECTOMY  02/05/2017   Procedure: POLYPECTOMY;  Surgeon: Shaaron Lamar HERO, MD;  [facility];  Service: Endoscopy;;  colon    Family History  Problem Relation Age of Onset   Prostate cancer Brother    Congestive Heart Failure Mother    Heart failure Brother    Colon cancer Neg Hx     Allergies as of 06/21/2024 - Review Complete 06/21/2024  Allergen Reaction Noted   Rabbit protein Swelling 05/13/2013   Hydrocodone-acetaminophen  Rash 12/07/2015   Other Swelling and Rash 05/13/2013   Ivp dye [iodinated contrast media] Other (See Comments) 01/30/2017    Social History   Socioeconomic History   Marital status: Married    Spouse name: Not on file   Number of children: Not on file   Years of education: Not on file   Highest education level: Not on file  Occupational History   Not on file  Tobacco Use   Smoking status: Former    Current packs/day: 0.00    Average packs/day: 2.0 packs/day for 53.0 years (106.0 ttl pk-yrs)    Types: Cigarettes    Start date: 05/25/1956    Quit date: 05/25/2009    Years since quitting: 15.0   Smokeless tobacco: Current     Types: Chew  Vaping Use   Vaping status: Never Used  Substance and Sexual Activity   Alcohol use: Not Currently   Drug use: No   Sexual activity: Yes  Other Topics Concern   Not on file  Social History Narrative   Not on file   Social Drivers of Health   Financial Resource Strain: Low Risk  (05/07/2022)   Received from [facility]   Overall Financial Resource Strain (CARDIA)    Difficulty of Paying Living Expenses: Not hard at all  Food Insecurity: No Food Insecurity (05/07/2022)   Received from [facility]   Hunger Vital Sign    Within the past 12 months, you worried that your food would run out before you got the money to buy more.: Never true    Within the past 12 months, the food you bought just didn't last and you didn't have money to get more.: Never true  Transportation Needs: No Transportation Needs (05/07/2022)   Received from [facility]   PRAPARE - Transportation    Lack of Transportation (Medical): No    Lack of Transportation (Non-Medical): No  Physical Activity: Inactive (05/07/2022)   Received [facility]   Exercise Vital Sign    On average, how  many days per week do you engage in moderate to strenuous exercise (like a brisk walk)?: 0 days    On average, how many minutes do you engage in exercise at this level?: 0 min  Stress: No Stress Concern Present (05/07/2022)   Received from [facility] - Occupational Stress Questionnaire    Feeling of Stress : Not at all  Social Connections: Socially Integrated (05/07/2022)   Received from [facility]   Social Connection and Isolation Panel    In a typical week, how many times do you talk on the phone with family, friends, or neighbors?: More than three times a week    How often do you get together with friends or relatives?: More than three times a week    How often do you attend church or religious services?: More than 4 times per year    Do you belong  to any clubs or organizations such as church groups, unions, fraternal or athletic groups, or school groups?: Yes    How often do you attend meetings of the clubs or organizations you belong to?: More than 4 times per year    Are you married, widowed, divorced, separated, never married, or living with a partner?: Married     Review of Systems   Gen: Denies fever, chills, anorexia. Denies fatigue, weakness, weight loss.  CV: Denies chest pain, palpitations, syncope, peripheral edema, and claudication. Resp: Denies dyspnea at rest, cough, wheezing, coughing up blood, and pleurisy. GI: See HPI Derm: Denies rash, itching, dry skin Psych: Denies depression, anxiety, memory loss, confusion. No homicidal or suicidal ideation.  Heme: Denies bruising, bleeding, and enlarged lymph nodes.  Physical Exam   BP (!) 162/71 (BP Location: Right Arm, Patient Position: Sitting, Cuff Size: Normal)   Pulse 66   Temp 97.9 F (36.6 C) (Oral)   Ht 5' 11 (1.803 m)   Wt 186 lb 3.2 oz (84.5 kg)   SpO2 97%   BMI 25.97 kg/m   General:   Alert and oriented. No distress noted. Pleasant and cooperative.  Head:  Normocephalic and atraumatic. Eyes:  Conjuctiva clear without scleral icterus. Mouth:  Oral mucosa pink and moist. Good dentition. No lesions. Abdomen:  +BS, soft, non-tender and non-distended. No rebound or guarding. No HSM or masses noted. Rectal: deferred Msk:  Symmetrical without gross deformities. Normal posture. Extremities:  Without edema. Neurologic:  Alert and  oriented x4 Psych:  Alert and cooperative. Normal mood and affect.  Assessment & Plan  COWEN PESQUEIRA is a 78 y.o. male presenting today for cirrhosis and IDA follow-up.     Cirrhosis secondary to MASLD Cirrhosis secondary to MASLD. No current signs of HE, ascites, jaundice. Continuing to work towards adhering to low sodium diet but not quite there yet. No longer having peripheral edema and not on Lasix. Currently due for hepatoma  screening next month and MELD labs now. Last EGD in 2023 with grade 1-2 varices. Currently doing well on nadolol , current HR 66.  - Reinforced low sodium diet - CBC, CMP, INR, AFP today - RUQ US  next month.  - Continue nadolol  20 mg daily.   Iron deficiency anemia due to chronic blood loss Iron deficiency anemia due to chronic blood loss from colonic AVMs with stable hemoglobin levels since iron infusion in July. - Continue daily oral iron supplementation. - CBC ordered today with MELD labs  Esophageal varices under surveillance - Continue nadolol  20mg  daily for prophylaxis. -  Refilled.  - Plan for upper endoscopy likely next year.   Gastroesophageal reflux disease (GERD) GERD well-controlled with pantoprazole , no breakthrough symptoms, dysphagia, nausea, or vomiting reported. - Continue pantoprazole  40 mg once daily.      Follow up   Follow up 6 months.     Charmaine Melia, MSN, FNP-BC, AGACNP-BC [facility]

## 2024-06-22 ENCOUNTER — Ambulatory Visit: Payer: Self-pay | Admitting: Gastroenterology

## 2024-06-27 LAB — COMPREHENSIVE METABOLIC PANEL WITH GFR
ALT: 17 IU/L (ref 0–44)
AST: 26 IU/L (ref 0–40)
Albumin: 4.4 g/dL (ref 3.8–4.8)
Alkaline Phosphatase: 84 IU/L (ref 47–123)
BUN/Creatinine Ratio: 15 (ref 10–24)
BUN: 19 mg/dL (ref 8–27)
Bilirubin Total: 0.5 mg/dL (ref 0.0–1.2)
CO2: 22 mmol/L (ref 20–29)
Calcium: 9.7 mg/dL (ref 8.6–10.2)
Chloride: 105 mmol/L (ref 96–106)
Creatinine, Ser: 1.28 mg/dL — ABNORMAL HIGH (ref 0.76–1.27)
Globulin, Total: 1.9 g/dL (ref 1.5–4.5)
Glucose: 90 mg/dL (ref 70–99)
Potassium: 4.7 mmol/L (ref 3.5–5.2)
Sodium: 140 mmol/L (ref 134–144)
Total Protein: 6.3 g/dL (ref 6.0–8.5)
eGFR: 57 mL/min/1.73 — ABNORMAL LOW (ref >59)

## 2024-06-27 LAB — CBC WITH DIFFERENTIAL/PLATELET
Basophils Absolute: 0 x10E3/uL (ref 0.0–0.2)
Basos: 0 %
EOS (ABSOLUTE): 0.1 x10E3/uL (ref 0.0–0.4)
Eos: 3 %
Hematocrit: 27.6 % — ABNORMAL LOW (ref 37.5–51.0)
Hemoglobin: 8.9 g/dL — ABNORMAL LOW (ref 13.0–17.7)
Immature Grans (Abs): 0 x10E3/uL (ref 0.0–0.1)
Immature Granulocytes: 0 %
Lymphocytes Absolute: 0.7 x10E3/uL (ref 0.7–3.1)
Lymphs: 23 %
MCH: 32.7 pg (ref 26.6–33.0)
MCHC: 32.2 g/dL (ref 31.5–35.7)
MCV: 102 fL — ABNORMAL HIGH (ref 79–97)
Monocytes Absolute: 0.8 x10E3/uL (ref 0.1–0.9)
Monocytes: 25 %
Neutrophils Absolute: 1.5 x10E3/uL (ref 1.4–7.0)
Neutrophils: 49 %
Platelets: 79 x10E3/uL — CL (ref 150–450)
RBC: 2.72 x10E6/uL — CL (ref 4.14–5.80)
RDW: 13.5 % (ref 11.6–15.4)
WBC: 3.2 x10E3/uL — ABNORMAL LOW (ref 3.4–10.8)

## 2024-06-27 LAB — SPECIMEN STATUS REPORT

## 2024-06-27 LAB — PROTIME-INR
INR: 1 (ref 0.9–1.2)
Prothrombin Time: 10.9 s (ref 9.1–12.0)

## 2024-06-27 LAB — AFP TUMOR MARKER: AFP, Serum, Tumor Marker: <1.8 ng/mL (ref 0.0–8.4)

## 2024-06-29 ENCOUNTER — Inpatient Hospital Stay: Attending: Hematology | Admitting: Oncology

## 2024-06-29 ENCOUNTER — Inpatient Hospital Stay

## 2024-06-29 VITALS — BP 138/66 | HR 61 | Temp 97.5°F | Resp 18 | Ht 71.0 in | Wt 180.0 lb

## 2024-06-29 DIAGNOSIS — D500 Iron deficiency anemia secondary to blood loss (chronic): Secondary | ICD-10-CM | POA: Diagnosis not present

## 2024-06-29 DIAGNOSIS — D61818 Other pancytopenia: Secondary | ICD-10-CM | POA: Diagnosis not present

## 2024-06-29 DIAGNOSIS — K922 Gastrointestinal hemorrhage, unspecified: Secondary | ICD-10-CM | POA: Diagnosis present

## 2024-06-29 DIAGNOSIS — K7460 Unspecified cirrhosis of liver: Secondary | ICD-10-CM | POA: Insufficient documentation

## 2024-06-29 DIAGNOSIS — R350 Frequency of micturition: Secondary | ICD-10-CM | POA: Insufficient documentation

## 2024-06-29 LAB — IRON AND TIBC
Iron: 171 ug/dL (ref 45–182)
Saturation Ratios: 40 % — ABNORMAL HIGH (ref 17.9–39.5)
TIBC: 431 ug/dL (ref 250–450)
UIBC: 260 ug/dL

## 2024-06-29 LAB — CBC WITH DIFFERENTIAL/PLATELET
Abs Immature Granulocytes: 0.04 K/uL (ref 0.00–0.07)
Basophils Absolute: 0 K/uL (ref 0.0–0.1)
Basophils Relative: 0 %
Eosinophils Absolute: 0.1 K/uL (ref 0.0–0.5)
Eosinophils Relative: 2 %
HCT: 26.9 % — ABNORMAL LOW (ref 39.0–52.0)
Hemoglobin: 8.9 g/dL — ABNORMAL LOW (ref 13.0–17.0)
Immature Granulocytes: 1 %
Lymphocytes Relative: 17 %
Lymphs Abs: 0.5 K/uL — ABNORMAL LOW (ref 0.7–4.0)
MCH: 33.5 pg (ref 26.0–34.0)
MCHC: 33.1 g/dL (ref 30.0–36.0)
MCV: 101.1 fL — ABNORMAL HIGH (ref 80.0–100.0)
Monocytes Absolute: 0.8 K/uL (ref 0.1–1.0)
Monocytes Relative: 24 %
Neutro Abs: 1.8 K/uL (ref 1.7–7.7)
Neutrophils Relative %: 56 %
Platelets: 70 K/uL — ABNORMAL LOW (ref 150–400)
RBC: 2.66 MIL/uL — ABNORMAL LOW (ref 4.22–5.81)
RDW: 14.7 % (ref 11.5–15.5)
WBC: 3.3 K/uL — ABNORMAL LOW (ref 4.0–10.5)
nRBC: 0 % (ref 0.0–0.2)

## 2024-06-29 LAB — COMPREHENSIVE METABOLIC PANEL WITH GFR
ALT: 21 U/L (ref 0–44)
AST: 29 U/L (ref 15–41)
Albumin: 4.6 g/dL (ref 3.5–5.0)
Alkaline Phosphatase: 86 U/L (ref 38–126)
Anion gap: 10 (ref 5–15)
BUN: 19 mg/dL (ref 8–23)
CO2: 25 mmol/L (ref 22–32)
Calcium: 9.6 mg/dL (ref 8.9–10.3)
Chloride: 102 mmol/L (ref 98–111)
Creatinine, Ser: 1.33 mg/dL — ABNORMAL HIGH (ref 0.61–1.24)
GFR, Estimated: 55 mL/min — ABNORMAL LOW (ref >60)
Glucose, Bld: 200 mg/dL — ABNORMAL HIGH (ref 70–99)
Potassium: 5.1 mmol/L (ref 3.5–5.1)
Sodium: 136 mmol/L (ref 135–145)
Total Bilirubin: 0.5 mg/dL (ref 0.0–1.2)
Total Protein: 6.8 g/dL (ref 6.5–8.1)

## 2024-06-29 LAB — FERRITIN: Ferritin: 45 ng/mL (ref 24–336)

## 2024-06-29 LAB — LACTATE DEHYDROGENASE: LDH: 198 U/L — ABNORMAL HIGH (ref 98–192)

## 2024-06-29 MED ORDER — TAMSULOSIN HCL 0.4 MG PO CAPS: 0.4000 mg | ORAL_CAPSULE | Freq: Every day | ORAL | 0 refills | Status: DC

## 2024-06-29 MED ORDER — GABAPENTIN 100 MG PO CAPS: 100.0000 mg | ORAL_CAPSULE | Freq: Every day | ORAL | 0 refills | Status: DC

## 2024-06-29 NOTE — Progress Notes (Signed)
 [facility] OFFICE PROGRESS NOTE  Bertell Satterfield, MD  ASSESSMENT & PLAN:    Assessment & Plan Iron deficiency anemia due to chronic blood loss - Hemoglobin has fluctuated over the years with counts as low as 6.5- normal.  -Most recently he has been between 8-9.  -Previous workup did not reveal any underlying blood dyscrasia or hemolytic anemia. -No evidence of active bleed per patient, although suspect slow GI bleed from known AVMs.  He is followed closely by GI. -He was given 1 g of Monoferric  on 03/27/2023, 05/15/2023, 07/22/2023, 10/14/2023 and 04/05/2024. -He is currently taking iron supplements twice daily with good tolerance. -Lab work from today shows a hemoglobin 9.1 (8.4), ferritin 76 (28) and iron saturation 23%.  TIBC 352. -He does not need any additional IV iron at this time. Other pancytopenia (HCC) Flow cytometry did not reveal any abnormal B or T-cell population.  Folate level was WNL and B12 was elevated.  Reticulocyte count is normal.   Labs from today show white blood cell count 3.3 (2.6) hemoglobin 8.9 (8.9) and platelets are low at 70,000.  We reviewed his bone marrow biopsy:  - No abnormal immature population identified  - No abnormal B or T-cell population identified   The patient's history of pancytopenia is noted.  Morphologic  evaluation reveals a hypercellular bone marrow with trilineage  hematopoiesis.  While mild dyspoiesis is noted, it does not reach the  threshold dysplasia.  Blasts are not increased.  Flow cytometric  analysis did not identify abnormal immature population, or a clonal B or  T-cell population.  A myeloid NGS panel may be requested if clinically  indicated.  Correlation with pending cytogenetics is recommended for  further assessment.   -Discussed myeloid NGS testing with patient and wife. -Patient has CCUS with ASXL1 mutation which increases his risk for hematology malignancy such as MDS to 0.5 to 1% each year. -Will  continue to monitor his labs and treat if platelets persistently are below 30,000.  Will continue to monitor his anemia and give iron as needed. -Recommend routine follow-up every 3 months with labs.  -Will add on EPO and LDH today.  If EPO levels are elevated could try luspatercept  to bring up his hemoglobin.  If EPO levels are low, could try erythropoietin stimulating agent such as Retacrit to try and get his hemoglobin up.  Will call with results.      Orders Placed This Encounter  Procedures   Lactate dehydrogenase    Standing Status:   Future    Number of Occurrences:   1    Expected Date:   06/29/2024    Expiration Date:   06/29/2025   Erythropoietin    Standing Status:   Future    Number of Occurrences:   1    Expected Date:   06/29/2024    Expiration Date:   06/29/2025    INTERVAL HISTORY: Patient returns for follow-up and to discuss NGS results.   He denies any recent hospitalizations, surgeries or changes to baseline health.  He did see gastroenterology on 06/21/2024 for cirrhosis secondary to MASLD.  He is scheduled to have a right upper quadrant ultrasound next month.  Patient does have history of colonic AVMs and esophageal varices currently on nadolol  for prophylaxis.  Plan is to repeat endoscopy in 1 year.  He denies any visible blood in his stools.  Reports overall doing well since his last visit appetite is 80% energy levels are 75% denies any  pain.  He has some urinary frequency at bedtime.  Reports his PCP left the practice and he is looking for a new one.  He is asking for a refill on gabapentin  and Flomax in the interim.  We reviewed CBC, CMP, ferritin and iron panel.   SUMMARY OF HEMATOLOGIC HISTORY: Oncology History   No history exists.     CBC    Component Value Date/Time   WBC 3.3 (L) 06/29/2024 0841   RBC 2.66 (L) 06/29/2024 0841   HGB 8.9 (L) 06/29/2024 0841   HGB 8.9 (L) 06/21/2024 1218   HCT 26.9 (L) 06/29/2024 0841   HCT 27.6 (L) 06/21/2024  1218   PLT 70 (L) 06/29/2024 0841   PLT 79 (LL) 06/21/2024 1218   MCV 101.1 (H) 06/29/2024 0841   MCV 102 (H) 06/21/2024 1218   MCH 33.5 06/29/2024 0841   MCHC 33.1 06/29/2024 0841   RDW 14.7 06/29/2024 0841   RDW 13.5 06/21/2024 1218   LYMPHSABS 0.5 (L) 06/29/2024 0841   LYMPHSABS 0.7 06/21/2024 1218   MONOABS 0.8 06/29/2024 0841   EOSABS 0.1 06/29/2024 0841   EOSABS 0.1 06/21/2024 1218   BASOSABS 0.0 06/29/2024 0841   BASOSABS 0.0 06/21/2024 1218       Latest Ref Rng & Units 06/29/2024    8:41 AM 06/21/2024   12:18 PM 03/24/2024    1:22 PM  CMP  Glucose 70 - 99 mg/dL 799  90  809   BUN 8 - 23 mg/dL 19  19  21    Creatinine 0.61 - 1.24 mg/dL 8.66  8.71  8.67   Sodium 135 - 145 mmol/L 136  140  138   Potassium 3.5 - 5.1 mmol/L 5.1  4.7  4.8   Chloride 98 - 111 mmol/L 102  105  105   CO2 22 - 32 mmol/L 25  22  21    Calcium  8.9 - 10.3 mg/dL 9.6  9.7  9.4   Total Protein 6.5 - 8.1 g/dL 6.8  6.3  6.2   Total Bilirubin 0.0 - 1.2 mg/dL 0.5  0.5  0.6   Alkaline Phos 38 - 126 U/L 86  84  82   AST 15 - 41 U/L 29  26  28    ALT 0 - 44 U/L 21  17  21       Lab Results  Component Value Date   FERRITIN 45 06/29/2024   VITAMINB12 1,326 (H) 02/05/2024    Vitals:   06/29/24 1031  BP: 138/66  Pulse: 61  Resp: 18  Temp: (!) 97.5 F (36.4 C)  SpO2: 100%    Review of System:  Review of Systems  Constitutional:  Positive for malaise/fatigue.  Genitourinary:  Positive for frequency.    Physical Exam: Physical Exam Constitutional:      Appearance: Normal appearance.  HENT:     Head: Normocephalic and atraumatic.  Eyes:     Pupils: Pupils are equal, round, and reactive to light.  Cardiovascular:     Rate and Rhythm: Normal rate and regular rhythm.     Heart sounds: Normal heart sounds. No murmur heard. Pulmonary:     Effort: Pulmonary effort is normal.     Breath sounds: Normal breath sounds. No wheezing.  Abdominal:     General: Bowel sounds are normal. There is no  distension.     Palpations: Abdomen is soft.     Tenderness: There is no abdominal tenderness.  Musculoskeletal:        General:  Normal range of motion.     Cervical back: Normal range of motion.  Skin:    General: Skin is warm and dry.     Findings: No rash.  Neurological:     Mental Status: He is alert and oriented to person, place, and time.     Gait: Gait is intact.  Psychiatric:        Mood and Affect: Mood and affect normal.        Cognition and Memory: Memory normal.        Judgment: Judgment normal.      I spent 20 minutes dedicated to the care of this patient (face-to-face and non-face-to-face) on the date of the encounter to include what is described in the assessment and plan.,  Delon Hope, NP 06/29/2024 12:53 PM

## 2024-06-29 NOTE — Addendum Note (Signed)
 Addended by: Kierah Goatley on: 06/29/2024 04:27 PM   Modules accepted: Level of Service

## 2024-06-29 NOTE — Assessment & Plan Note (Addendum)
 Flow cytometry did not reveal any abnormal B or T-cell population.  Folate level was WNL and B12 was elevated.  Reticulocyte count is normal.   Labs from today show white blood cell count 3.3 (2.6) hemoglobin 8.9 (8.9) and platelets are low at 70,000.  We reviewed his bone marrow biopsy:  - No abnormal immature population identified  - No abnormal B or T-cell population identified   The patient's history of pancytopenia is noted.  Morphologic  evaluation reveals a hypercellular bone marrow with trilineage  hematopoiesis.  While mild dyspoiesis is noted, it does not reach the  threshold dysplasia.  Blasts are not increased.  Flow cytometric  analysis did not identify abnormal immature population, or a clonal B or  T-cell population.  A myeloid NGS panel may be requested if clinically  indicated.  Correlation with pending cytogenetics is recommended for  further assessment.   -Discussed myeloid NGS testing with patient and wife. -Patient has CCUS with ASXL1 mutation which increases his risk for hematology malignancy such as MDS to 0.5 to 1% each year. -Will continue to monitor his labs and treat if platelets persistently are below 30,000.  Will continue to monitor his anemia and give iron as needed. -Recommend routine follow-up every 3 months with labs.  -Will add on EPO and LDH today.  If EPO levels are elevated could try luspatercept  to bring up his hemoglobin.  If EPO levels are low, could try erythropoietin stimulating agent such as Retacrit to try and get his hemoglobin up.  Will call with results.

## 2024-06-29 NOTE — Assessment & Plan Note (Addendum)
-   Hemoglobin has fluctuated over the years with counts as low as 6.5- normal.  -Most recently he has been between 8-9.  -Previous workup did not reveal any underlying blood dyscrasia or hemolytic anemia. -No evidence of active bleed per patient, although suspect slow GI bleed from known AVMs.  He is followed closely by GI. -He was given 1 g of Monoferric  on 03/27/2023, 05/15/2023, 07/22/2023, 10/14/2023 and 04/05/2024. -He is currently taking iron supplements twice daily with good tolerance. -Lab work from today shows a hemoglobin 9.1 (8.4), ferritin 76 (28) and iron saturation 23%.  TIBC 352. -He does not need any additional IV iron at this time.

## 2024-07-01 ENCOUNTER — Ambulatory Visit: Payer: Self-pay | Admitting: Oncology

## 2024-07-01 LAB — ERYTHROPOIETIN: Erythropoietin: 62.2 m[IU]/mL — ABNORMAL HIGH (ref 2.6–18.5)

## 2024-07-01 NOTE — Progress Notes (Signed)
 EPO is high. So possibly luspatercept  for his anemia?   Delon Hope, NP 07/01/2024 8:52 AM

## 2024-07-06 ENCOUNTER — Other Ambulatory Visit: Payer: Self-pay | Admitting: Oncology

## 2024-07-06 DIAGNOSIS — D469 Myelodysplastic syndrome, unspecified: Secondary | ICD-10-CM | POA: Insufficient documentation

## 2024-07-06 NOTE — Progress Notes (Signed)
START ON PATHWAY REGIMEN - MDS     A cycle is every 21 days:     Luspatercept-aamt   **Always confirm dose/schedule in your pharmacy ordering system**  Patient Characteristics: Lower-Risk, Without Ring Sideroblasts AND No SF3B1 Mutation, First Line, Symptomatic Anemia, EPO Level ? 500 Did cytogenetic and molecular analysis reveal an isolated del(5q) or del(5q) with one other cytogenetic abnormality except monosomy 7 or 7q deletion with no concomitant TP53 mutations<= No Disease Characteristics: Without Ring Sideroblasts AND No SF3B1 Mutation Line of Therapy: First Line Disease Characteristics: Symptomatic Anemia Patient Characteristics: EPO Level ? 500 Intent of Therapy: Non-Curative / Palliative Intent, Discussed with Patient

## 2024-07-06 NOTE — Progress Notes (Signed)
 Hey Danielle, can we get this patient set up for luspatercept  every 3 weeks with labs.  I am not sure when he is scheduled to come back but if you could try to link up his appointment with one of his injections.  Delon Hope, NP 07/06/2024 12:02 PM

## 2024-07-07 ENCOUNTER — Other Ambulatory Visit: Payer: Self-pay

## 2024-07-08 ENCOUNTER — Encounter: Payer: Self-pay | Admitting: Cardiology

## 2024-07-08 ENCOUNTER — Ambulatory Visit: Attending: Cardiology | Admitting: Cardiology

## 2024-07-08 VITALS — BP 126/72 | HR 68 | Ht 71.0 in | Wt 180.0 lb

## 2024-07-08 DIAGNOSIS — I10 Essential (primary) hypertension: Secondary | ICD-10-CM | POA: Diagnosis present

## 2024-07-08 DIAGNOSIS — E782 Mixed hyperlipidemia: Secondary | ICD-10-CM | POA: Insufficient documentation

## 2024-07-08 DIAGNOSIS — I480 Paroxysmal atrial fibrillation: Secondary | ICD-10-CM | POA: Diagnosis present

## 2024-07-08 NOTE — Patient Instructions (Signed)
 Medication Instructions:  Your physician recommends that you continue on your current medications as directed. Please refer to the Current Medication list given to you today.   Labwork: None today  Testing/Procedures: None today  Follow-Up: 6 months  Any Other Special Instructions Will Be Listed Below (If Applicable).  If you need a refill on your cardiac medications before your next appointment, please call your pharmacy.

## 2024-07-08 NOTE — Progress Notes (Signed)
    Cardiology Office Note  Date: 07/08/2024   ID: Derek Drake, Derek Drake 1945-10-16, MRN [id number]  History of Present Illness: Derek Drake is a 78 y.o. male last seen in April.  He is here for a routine visit.  Reports no definite sense of palpitations and no chest discomfort.  Does have dyspnea on exertion at times which is likely more related to his anemia.  He is following in the hematology clinic for treatment of iron deficiency anemia/pancytopenia (CCUS).  Recent hemoglobin 8.9 with plan for Luspatercept .  I reviewed his medications.  He reports compliance with therapy and no obvious intolerances.  I went over his recent lab work as well.  Blood pressure is well-controlled today.  Physical Exam: VS:  BP 126/72   Pulse 68   Ht 5' 11 (1.803 m)   Wt 180 lb (81.6 kg)   SpO2 99%   BMI 25.10 kg/m , BMI Body mass index is 25.1 kg/m.  Wt Readings from Last 3 Encounters:  07/08/24 180 lb (81.6 kg)  06/29/24 180 lb (81.6 kg)  06/21/24 186 lb 3.2 oz (84.5 kg)    General: Patient appears comfortable at rest. HEENT: Conjunctiva and lids normal. Neck: Supple, no elevated JVP or carotid bruits. Lungs: Clear to auscultation, nonlabored breathing at rest. Cardiac: Regular rate and rhythm, no S3 or significant systolic murmur. Extremities: No pitting edema.  ECG:  An ECG dated 12/31/2023 was personally reviewed today and demonstrated:  Sinus rhythm with prolonged PR interval and increased voltage.  Labwork: August 2024: Cholesterol 104, triglycerides 140, HDL 28, LDL 51 06/29/2024: ALT 21; AST 29; BUN 19; Creatinine, Ser 1.33; Hemoglobin 8.9; Platelets 70; Potassium 5.1; Sodium 136, GFR 55  Other Studies Reviewed Today:  No interval cardiac testing for review today.  Assessment and Plan:  1.  Paroxysmal atrial fibrillation with CHA2DS2-VASc score of 4.  Anticoagulation discontinued previously in light of recurrent GI bleeding with documented AVMs requiring PRBC transfusion.  He has  declined Museum/gallery conservator.  Asymptomatic in terms of palpitations and heart rate is regular today.  Continue nadolol  20 mg daily.   2.  Primary hypertension.  Blood pressure control is adequate today.  Continue Cozaar 100 mg daily.   3.  Mixed hyperlipidemia.  LDL 51 in August 2024.  Continue Zocor  40 mg daily.  Disposition:  Follow up 6 months.  Signed, Jayson JUDITHANN Sierras, M.D., F.A.C.C. [facility]

## 2024-07-09 ENCOUNTER — Observation Stay (HOSPITAL_COMMUNITY)

## 2024-07-09 ENCOUNTER — Emergency Department (HOSPITAL_COMMUNITY)

## 2024-07-09 ENCOUNTER — Other Ambulatory Visit: Payer: Self-pay

## 2024-07-09 ENCOUNTER — Observation Stay (HOSPITAL_COMMUNITY)
Admission: EM | Admit: 2024-07-09 | Discharge: 2024-07-11 | Disposition: A | Discharge status: Home / Self Care | Attending: Internal Medicine | Admitting: Internal Medicine

## 2024-07-09 ENCOUNTER — Encounter (HOSPITAL_COMMUNITY): Payer: Self-pay

## 2024-07-09 DIAGNOSIS — N1831 Chronic kidney disease, stage 3a: Secondary | ICD-10-CM | POA: Insufficient documentation

## 2024-07-09 DIAGNOSIS — R55 Syncope and collapse: Secondary | ICD-10-CM | POA: Diagnosis not present

## 2024-07-09 DIAGNOSIS — N179 Acute kidney failure, unspecified: Secondary | ICD-10-CM

## 2024-07-09 DIAGNOSIS — Z8679 Personal history of other diseases of the circulatory system: Secondary | ICD-10-CM | POA: Insufficient documentation

## 2024-07-09 DIAGNOSIS — E1169 Type 2 diabetes mellitus with other specified complication: Secondary | ICD-10-CM | POA: Insufficient documentation

## 2024-07-09 DIAGNOSIS — Z87891 Personal history of nicotine dependence: Secondary | ICD-10-CM | POA: Insufficient documentation

## 2024-07-09 DIAGNOSIS — I10 Essential (primary) hypertension: Secondary | ICD-10-CM | POA: Diagnosis present

## 2024-07-09 DIAGNOSIS — E1122 Type 2 diabetes mellitus with diabetic chronic kidney disease: Secondary | ICD-10-CM | POA: Insufficient documentation

## 2024-07-09 DIAGNOSIS — K7469 Other cirrhosis of liver: Secondary | ICD-10-CM | POA: Diagnosis present

## 2024-07-09 DIAGNOSIS — E119 Type 2 diabetes mellitus without complications: Secondary | ICD-10-CM

## 2024-07-09 DIAGNOSIS — E875 Hyperkalemia: Secondary | ICD-10-CM | POA: Insufficient documentation

## 2024-07-09 DIAGNOSIS — E782 Mixed hyperlipidemia: Secondary | ICD-10-CM | POA: Diagnosis present

## 2024-07-09 DIAGNOSIS — T68XXXA Hypothermia, initial encounter: Secondary | ICD-10-CM

## 2024-07-09 DIAGNOSIS — I8500 Esophageal varices without bleeding: Secondary | ICD-10-CM | POA: Diagnosis present

## 2024-07-09 DIAGNOSIS — K219 Gastro-esophageal reflux disease without esophagitis: Secondary | ICD-10-CM | POA: Diagnosis present

## 2024-07-09 DIAGNOSIS — I480 Paroxysmal atrial fibrillation: Secondary | ICD-10-CM | POA: Diagnosis present

## 2024-07-09 DIAGNOSIS — D500 Iron deficiency anemia secondary to blood loss (chronic): Secondary | ICD-10-CM | POA: Diagnosis present

## 2024-07-09 DIAGNOSIS — E871 Hypo-osmolality and hyponatremia: Secondary | ICD-10-CM | POA: Insufficient documentation

## 2024-07-09 DIAGNOSIS — D696 Thrombocytopenia, unspecified: Secondary | ICD-10-CM | POA: Diagnosis present

## 2024-07-09 DIAGNOSIS — D61818 Other pancytopenia: Secondary | ICD-10-CM | POA: Diagnosis present

## 2024-07-09 DIAGNOSIS — D469 Myelodysplastic syndrome, unspecified: Secondary | ICD-10-CM | POA: Diagnosis present

## 2024-07-09 DIAGNOSIS — K7581 Nonalcoholic steatohepatitis (NASH): Secondary | ICD-10-CM | POA: Diagnosis present

## 2024-07-09 HISTORY — DX: Syncope and collapse: R55

## 2024-07-09 LAB — CBC WITH DIFFERENTIAL/PLATELET
Abs Immature Granulocytes: 0.06 K/uL (ref 0.00–0.07)
Basophils Absolute: 0 K/uL (ref 0.0–0.1)
Basophils Relative: 0 %
Eosinophils Absolute: 0.1 K/uL (ref 0.0–0.5)
Eosinophils Relative: 2 %
HCT: 25.2 % — ABNORMAL LOW (ref 39.0–52.0)
Hemoglobin: 8.4 g/dL — ABNORMAL LOW (ref 13.0–17.0)
Immature Granulocytes: 2 %
Lymphocytes Relative: 17 %
Lymphs Abs: 0.6 K/uL — ABNORMAL LOW (ref 0.7–4.0)
MCH: 33.9 pg (ref 26.0–34.0)
MCHC: 33.3 g/dL (ref 30.0–36.0)
MCV: 101.6 fL — ABNORMAL HIGH (ref 80.0–100.0)
Monocytes Absolute: 0.8 K/uL (ref 0.1–1.0)
Monocytes Relative: 21 %
Neutro Abs: 2.2 K/uL (ref 1.7–7.7)
Neutrophils Relative %: 58 %
Platelets: 76 K/uL — ABNORMAL LOW (ref 150–400)
RBC: 2.48 MIL/uL — ABNORMAL LOW (ref 4.22–5.81)
RDW: 14.8 % (ref 11.5–15.5)
WBC: 3.7 K/uL — ABNORMAL LOW (ref 4.0–10.5)
nRBC: 0 % (ref 0.0–0.2)

## 2024-07-09 LAB — URINALYSIS, ROUTINE W REFLEX MICROSCOPIC
Bacteria, UA: NONE SEEN
Bilirubin Urine: NEGATIVE
Glucose, UA: 150 mg/dL — AB
Hgb urine dipstick: NEGATIVE
Ketones, ur: NEGATIVE mg/dL
Leukocytes,Ua: NEGATIVE
Nitrite: NEGATIVE
Protein, ur: 100 mg/dL — AB
Specific Gravity, Urine: 1.013 (ref 1.005–1.030)
pH: 6 (ref 5.0–8.0)

## 2024-07-09 LAB — TSH: TSH: 3.87 u[IU]/mL (ref 0.350–4.500)

## 2024-07-09 LAB — TYPE AND SCREEN
ABO/RH(D): O POS
Antibody Screen: NEGATIVE

## 2024-07-09 LAB — COMPREHENSIVE METABOLIC PANEL WITH GFR
ALT: 19 U/L (ref 0–44)
AST: 26 U/L (ref 15–41)
Albumin: 4.2 g/dL (ref 3.5–5.0)
Alkaline Phosphatase: 78 U/L (ref 38–126)
Anion gap: 13 (ref 5–15)
BUN: 26 mg/dL — ABNORMAL HIGH (ref 8–23)
CO2: 20 mmol/L — ABNORMAL LOW (ref 22–32)
Calcium: 8.7 mg/dL — ABNORMAL LOW (ref 8.9–10.3)
Chloride: 101 mmol/L (ref 98–111)
Creatinine, Ser: 1.56 mg/dL — ABNORMAL HIGH (ref 0.61–1.24)
GFR, Estimated: 45 mL/min — ABNORMAL LOW (ref >60)
Glucose, Bld: 280 mg/dL — ABNORMAL HIGH (ref 70–99)
Potassium: 5.2 mmol/L — ABNORMAL HIGH (ref 3.5–5.1)
Sodium: 134 mmol/L — ABNORMAL LOW (ref 135–145)
Total Bilirubin: 0.6 mg/dL (ref 0.0–1.2)
Total Protein: 6 g/dL — ABNORMAL LOW (ref 6.5–8.1)

## 2024-07-09 LAB — TROPONIN T, HIGH SENSITIVITY
Troponin T High Sensitivity: <15 ng/L (ref 0–19)
Troponin T High Sensitivity: <15 ng/L (ref 0–19)

## 2024-07-09 LAB — GLUCOSE, CAPILLARY: Glucose-Capillary: 204 mg/dL — ABNORMAL HIGH (ref 70–99)

## 2024-07-09 LAB — LACTIC ACID, PLASMA
Lactic Acid, Venous: 1.4 mmol/L (ref 0.5–1.9)
Lactic Acid, Venous: 1.3 mmol/L (ref 0.5–1.9)

## 2024-07-09 LAB — D-DIMER, QUANTITATIVE: D-Dimer, Quant: 0.55 ug{FEU}/mL — ABNORMAL HIGH (ref 0.00–0.50)

## 2024-07-09 MED ORDER — ALBUTEROL SULFATE (2.5 MG/3ML) 0.083% IN NEBU: 2.5000 mg | INHALATION_SOLUTION | RESPIRATORY_TRACT | Status: DC | PRN

## 2024-07-09 MED ORDER — INSULIN ASPART 100 UNIT/ML IJ SOLN
0.0000 [IU] | Freq: Three times a day (TID) | INTRAMUSCULAR | Status: DC
  Administered 2024-07-10: 2 [IU] via SUBCUTANEOUS
  Administered 2024-07-10 (×2): 1 [IU] via SUBCUTANEOUS
  Administered 2024-07-11: 2 [IU] via SUBCUTANEOUS
  Administered 2024-07-11: 3 [IU] via SUBCUTANEOUS

## 2024-07-09 MED ORDER — INSULIN ASPART 100 UNIT/ML IJ SOLN
0.0000 [IU] | Freq: Every day | INTRAMUSCULAR | Status: DC
  Administered 2024-07-09: 2 [IU] via SUBCUTANEOUS

## 2024-07-09 MED ORDER — CALCIUM CARBONATE ANTACID 500 MG PO CHEW
2.0000 | CHEWABLE_TABLET | Freq: Once | ORAL | Status: AC
  Administered 2024-07-09: 400 mg via ORAL
  Filled 2024-07-09: qty 2

## 2024-07-09 MED ORDER — ADULT MULTIVITAMIN W/MINERALS CH
1.0000 | ORAL_TABLET | Freq: Every day | ORAL | Status: DC
  Administered 2024-07-10 – 2024-07-11 (×2): 1 via ORAL
  Filled 2024-07-09 (×2): qty 1

## 2024-07-09 MED ORDER — NADOLOL 20 MG PO TABS
20.0000 mg | ORAL_TABLET | Freq: Every day | ORAL | Status: DC
  Administered 2024-07-10: 20 mg via ORAL
  Filled 2024-07-09 (×3): qty 1

## 2024-07-09 MED ORDER — PANTOPRAZOLE SODIUM 40 MG PO TBEC
40.0000 mg | DELAYED_RELEASE_TABLET | Freq: Every day | ORAL | Status: DC
  Administered 2024-07-10 – 2024-07-11 (×2): 40 mg via ORAL
  Filled 2024-07-09 (×2): qty 1

## 2024-07-09 MED ORDER — TAMSULOSIN HCL 0.4 MG PO CAPS
0.4000 mg | ORAL_CAPSULE | Freq: Every day | ORAL | Status: DC
  Administered 2024-07-09 – 2024-07-11 (×3): 0.4 mg via ORAL
  Filled 2024-07-09 (×3): qty 1

## 2024-07-09 MED ORDER — FERROUS SULFATE 325 (65 FE) MG PO TABS
325.0000 mg | ORAL_TABLET | Freq: Two times a day (BID) | ORAL | Status: DC
  Administered 2024-07-09 – 2024-07-11 (×4): 325 mg via ORAL
  Filled 2024-07-09 (×4): qty 1

## 2024-07-09 MED ORDER — VITAMIN D 25 MCG (1000 UNIT) PO TABS
1000.0000 [IU] | ORAL_TABLET | Freq: Every day | ORAL | Status: DC
  Administered 2024-07-10 – 2024-07-11 (×2): 1000 [IU] via ORAL
  Filled 2024-07-09 (×2): qty 1

## 2024-07-09 MED ORDER — SIMVASTATIN 20 MG PO TABS
40.0000 mg | ORAL_TABLET | Freq: Every evening | ORAL | Status: DC
  Administered 2024-07-09 – 2024-07-10 (×2): 40 mg via ORAL
  Filled 2024-07-09 (×2): qty 2

## 2024-07-09 MED ORDER — HYDRALAZINE HCL 20 MG/ML IJ SOLN: 5.0000 mg | INTRAMUSCULAR | Status: DC | PRN

## 2024-07-09 MED ORDER — SODIUM CHLORIDE 0.9 % IV BOLUS
500.0000 mL | Freq: Once | INTRAVENOUS | Status: AC
  Administered 2024-07-09: 500 mL via INTRAVENOUS

## 2024-07-09 NOTE — ED Triage Notes (Signed)
 Pt BIB EMS from a syncopal episode. Pt denies pain. Pt is pale during triage. Pt has had n/v but wide QT intervals so no zofran  given. Pt CBG 327. Pt states his provider took him Glipzide and switched to Metformin 2 in the morning and 1 at night. Pt does state anemia hx.

## 2024-07-09 NOTE — ED Provider Notes (Signed)
 [place] [facility] AT [facility] Provider Note   CSN: [id number] Arrival date & time: 07/09/24  1528     Patient presents with: Loss of Consciousness   Derek Drake is a 78 y.o. male.    Loss of Consciousness     She presents because syncopal episode.  Patient states that her last humerus was tried to lift something into his wife's truck. He notes he woke up in the back of ambulance.  Patient states that he has been feeling somewhat fatigued here recently.  Maybe some slight shortness of breath as well.  Patient states that when he is walking to family members house today he is noticing some slight shortness of breath.  He states that he gets like that sometimes whenever he has a very low blood counts.  He states that he has had iron transfusions in the past.  He thinks his last iron transfusion was approximately 2 to 3 months ago.  No chest pain.  No exertional chest pain.  Does not take any anticoagulation.  No headache.  No neck or back pain.  No numbness or ting anywhere.  No sick contacts he is aware of.  According to wife: Patient had walked out to the garage to put something in her car.  Subsequently stated that he felt like he could not really breathe.  Came back and fell to the floor.  He was unresponsive for approximate 5 minutes.  She felt like he was having shallow breaths at that time.  She called 911 and they told her to start CPR.  She does not think he ever lost pulses.  Slowly look back up.  Patient is complaining of some shortness of breath over the past couple days.  Previous medical history reviewed : Patient followed up with cardiology yesterday.  Following with hematology clinic for treatment of iron deficiency anemia pancytopenia.  He has a history of proximal A-fib.  Chads Vascor of 4.  Anticoagulation discontinued.  In the setting of recurrent GI bleeding with document AVMs.  Watchman device evaluation. On Nadolol  20 mg daily.    Prior to  Admission medications   Medication Sig Start Date End Date Taking? Authorizing Provider  cholecalciferol (VITAMIN D3) 25 MCG (1000 UNIT) tablet Take 1,000 Units by mouth daily.    [provider]  clotrimazole-betamethasone (LOTRISONE) cream Apply topically 2 (two) times daily. 04/24/23   [provider]  [employer] IRON 325 (65 Fe) MG tablet Take 325 mg by mouth 2 (two) times daily. 06/17/22   [provider]  diltiazem  (TIADYLT  ER) 360 MG 24 hr capsule TAKE 1 CAPSULE BY MOUTH EVERY DAY 04/12/24   Debera Jayson MATSU, MD  furosemide (LASIX) 20 MG tablet Take 20-40 mg by mouth daily as needed. 06/11/23   [provider]  gabapentin  (NEURONTIN ) 100 MG capsule Take 1 capsule (100 mg total) by mouth at bedtime. 06/29/24   Geofm Delon BRAVO, NP  glimepiride (AMARYL) 4 MG tablet Take 4 mg by mouth daily. 04/26/24   [provider]  Anselm Oil 500 MG CAPS Take 500 mg by mouth daily.    [provider]  losartan (COZAAR) 100 MG tablet Take 100 mg by mouth daily. 06/17/22   [provider]  metFORMIN (GLUCOPHAGE) 500 MG tablet Take 1,000 mg by mouth 2 (two) times daily. Patient taking differently: Take 1,000 mg by mouth 2 (two) times daily. Taking 2 at night and 1 in the morning 12/25/23   [provider]  milk thistle 175 MG tablet Take 175 mg by mouth daily.    [provider]  Multiple Vitamin (MULTIVITAMIN WITH MINERALS) TABS tablet Take 1 tablet by mouth daily.    [provider]  nadolol  (CORGARD ) 20 MG tablet Take 1 tablet (20 mg total) by mouth daily. 06/21/24   Kennedy Charmaine CROME, NP  pantoprazole  (PROTONIX ) 40 MG tablet TAKE 1 TABLET BY MOUTH EVERY DAY BEFORE BREAKFAST 05/30/24   Rourk, Lamar HERO, MD  predniSONE  (DELTASONE ) 20 MG tablet Take 1 tablet (20 mg total) by mouth daily with breakfast. 02/05/24   Geofm Delon BRAVO, NP  simvastatin  (ZOCOR ) 40 MG tablet Take 40 mg by mouth every evening.     [provider]   tamsulosin (FLOMAX) 0.4 MG CAPS capsule Take 1 capsule (0.4 mg total) by mouth daily. 06/29/24   Geofm Delon BRAVO, NP    Allergies: Rabbit protein, Hydrocodone-acetaminophen , Other, and Ivp dye [iodinated contrast media]    Review of Systems  Cardiovascular:  Positive for syncope.    Updated Vital Signs BP (!) 129/58   Pulse (!) 56   Temp 97.6 F (36.4 C) (Oral)   Resp 15   Ht 5' 11 (1.803 m)   Wt 81.7 kg   SpO2 97%   BMI 25.11 kg/m   Physical Exam Vitals and nursing note reviewed.  Constitutional:      General: He is not in acute distress.    Appearance: He is well-developed.  HENT:     Head: Normocephalic and atraumatic.  Eyes:     Conjunctiva/sclera: Conjunctivae normal.  Cardiovascular:     Rate and Rhythm: Normal rate and regular rhythm.     Heart sounds: No murmur heard. Pulmonary:     Effort: Pulmonary effort is normal. No respiratory distress.     Breath sounds: Normal breath sounds.  Abdominal:     Palpations: Abdomen is soft.     Tenderness: There is no abdominal tenderness.  Musculoskeletal:        General: No swelling.     Cervical back: Neck supple.  Skin:    General: Skin is warm and dry.     Capillary Refill: Capillary refill takes less than 2 seconds.  Neurological:     Mental Status: He is alert.  Psychiatric:        Mood and Affect: Mood normal.     (all labs ordered are listed, but only abnormal results are displayed) Labs Reviewed  CBC WITH DIFFERENTIAL/PLATELET - Abnormal; Notable for the following components:      Result Value   WBC 3.7 (*)    RBC 2.48 (*)    Hemoglobin 8.4 (*)    HCT 25.2 (*)    MCV 101.6 (*)    Platelets 76 (*)    Lymphs Abs 0.6 (*)    All other components within normal limits  COMPREHENSIVE METABOLIC PANEL WITH GFR - Abnormal; Notable for the following components:   Sodium 134 (*)    Potassium 5.2 (*)    CO2 20 (*)    Glucose, Bld 280 (*)    BUN 26 (*)    Creatinine, Ser 1.56 (*)    Calcium  8.7 (*)     Total Protein 6.0 (*)    GFR, Estimated 45 (*)    All other components within normal limits  URINALYSIS, ROUTINE W REFLEX MICROSCOPIC - Abnormal; Notable for the following components:   Glucose, UA 150 (*)    Protein, ur 100 (*)  All other components within normal limits  D-DIMER, QUANTITATIVE - Abnormal; Notable for the following components:   D-Dimer, Quant 0.55 (*)    All other components within normal limits  TSH  LACTIC ACID, PLASMA  LACTIC ACID, PLASMA  CORTISOL  CBG MONITORING, ED  TYPE AND SCREEN  TROPONIN T, HIGH SENSITIVITY  TROPONIN T, HIGH SENSITIVITY    EKG: EKG Interpretation Date/Time:  Saturday July 09 2024 15:34:37 EDT Ventricular Rate:  56 PR Interval:  341 QRS Duration:  116 QT Interval:  478 QTC Calculation: 462 R Axis:   -5  Text Interpretation: Sinus or ectopic atrial rhythm Prolonged PR interval Nonspecific intraventricular conduction delay Confirmed by Simon Rea [id number]) on 07/09/2024 3:46:17 PM  Radiology: [facility] Chest Portable 1 View Result Date: 07/09/2024 CLINICAL DATA:  Syncopal episode. EXAM: PORTABLE CHEST 1 VIEW COMPARISON:  March 13, 2022 FINDINGS: The cardiac silhouette is mildly enlarged. Low lung volumes are noted. No acute infiltrate, pleural effusion or pneumothorax is identified. Radiopaque surgical clips are seen overlying the right upper quadrant. The visualized skeletal structures are unremarkable. IMPRESSION: No active disease. Electronically Signed   By: Suzen Dials M.D.   On: 07/09/2024 16:48   CT Head Wo Contrast Result Date: 07/09/2024 CLINICAL DATA:  Status post trauma. EXAM: CT HEAD WITHOUT CONTRAST TECHNIQUE: Contiguous axial images were obtained from the base of the skull through the vertex without intravenous contrast. RADIATION DOSE REDUCTION: This exam was performed according to the departmental dose-optimization program which includes automated exposure control, adjustment of the mA and/or kV according to patient  size and/or use of iterative reconstruction technique. COMPARISON:  June 19, 2023 FINDINGS: Brain: No evidence of acute infarction, hemorrhage, hydrocephalus, extra-axial collection or mass lesion/mass effect. Vascular: No hyperdense vessel or unexpected calcification. Skull: Normal. Negative for fracture or focal lesion. Sinuses/Orbits: No acute finding. Other: None. IMPRESSION: No acute intracranial pathology. Electronically Signed   By: Suzen Dials M.D.   On: 07/09/2024 16:46     Procedures   Medications Ordered in the ED  sodium chloride  0.9 % bolus 500 mL (500 mLs Intravenous New Bag/Given 07/09/24 1802)  calcium  carbonate (TUMS - dosed in mg elemental calcium ) chewable tablet 400 mg of elemental calcium  (400 mg of elemental calcium  Oral Given 07/09/24 1802)                                    Medical Decision Making Amount and/or Complexity of Data Reviewed Labs: ordered. Radiology: ordered.  Risk OTC drugs. Decision regarding hospitalization.  \    HPI:   She presents because syncopal episode.  Patient states that her last humerus was tried to lift something into his wife's truck. He notes he woke up in the back of ambulance.  Patient states that he has been feeling somewhat fatigued here recently.  Maybe some slight shortness of breath as well.  Patient states that when he is walking to family members house today he is noticing some slight shortness of breath.  He states that he gets like that sometimes whenever he has a very low blood counts.  He states that he has had iron transfusions in the past.  He thinks his last iron transfusion was approximately 2 to 3 months ago.  No chest pain.  No exertional chest pain.  Does not take any anticoagulation.  No headache.  No neck or back pain.  No numbness or ting anywhere.  No  sick contacts he is aware of.  According to wife: Patient had walked out to the garage to put something in her car.  Subsequently stated that he felt  like he could not really breathe.  Came back and fell to the floor.  He was unresponsive for approximate 5 minutes.  She felt like he was having shallow breaths at that time.  She called 911 and they told her to start CPR.  She does not think he ever lost pulses.  Slowly look back up.  Patient is complaining of some shortness of breath over the past couple days.  Previous medical history reviewed : Patient followed up with cardiology yesterday.  Following with hematology clinic for treatment of iron deficiency anemia pancytopenia.  He has a history of proximal A-fib.  Chads Vascor of 4.  Anticoagulation discontinued.  In the setting of recurrent GI bleeding with document AVMs.  Watchman device evaluation. On Nadolol  20 mg daily.    MDM:    Upon exam, patient was stable.  ANO x 3 GCS 15.  NIH is 0.  Cranials 2 through 12 intact.  Strength and sensation intact in bilateral upper and lower extremities.  No concerns for CVA.  No cervical thoracic or lumbar pain.  No concerns for any kind of injury to this area.  No indication for imaging.  In terms of etiology of syncope.  EKG showed sinus bradycardia.  Heart rate in the high 50s to low 60s.  Do not think this is driving his syncope episode today.  Patient is asymptomatic at this point time upon evaluation.  Plan on obtaining laboratory workup.  Obtaining CMP to assess for AKI or large electrolyte derangements.  Obtain CBC to assess for any, significant anemia.  In the setting of SOB, will obtain cxr and EKG tyroponin to rule out cardiopulm process or acs pathology though I think this in unlikely.   Reevaluation:   Patient remains neurologically intact.  ANO x 3 with GCS 15.  NIH remains 0.  Repeated patient's core body temp.  Core body temp found to be approximately 94 F.  Therefore, added on a lactic acid.  Lactic acid negative.  Started patient on [employer]  UA clear.  Chest x-ray shows no evidence of pneumonia.  No other signs or concerns for  infection at this time.  Unclear etiology of patient's hypothermia.  Responded well to the [employer]  Did not start patient on empiric antibiotics.  EKG showed no STEMI.  Bradycardic but not too far off of patient's baseline.  Patient is on a beta-blocker.  Will continue monitor on cardiac telemetry.  Troponin negative.  Obtaining repeat but so far unremarkable.  Hospitalist will follow repeat.  Obtain D-dimer given the shortness of breath.  Unremarkable.  No other risk factors.  No indication for CTA of the chest.   Patient has small AKI.  Creatinine 1.56.  Baseline around low ones.  Patient received 500 cc of fluid.  Potassium 5.2 but no EKG changes.  Calcium  of 8.7.  Was given Tums p.o.  Patient will be admitted for cardiac telemetry and echo tomorrow in the setting of unspecified syncope.  CT head obtained in setting of thrombocytopenia.  Wanted to rule out subdural epidural.  Negative.  Interventions: 500 cc fluid, tums  EKG Interpreted by Me: sinus brady   Cardiac Tele Interpreted by Me: sinus brady   I have independently interpreted the CXR  and CT  images and agree with the radiologist finding  Social Determinant of Health: None   Disposition and Follow Up: Admit       Final diagnoses:  Syncope and collapse  Hyperkalemia  AKI (acute kidney injury)  Hypothermia, initial encounter    ED Discharge Orders     None          Simon Lavonia SAILOR, MD 07/09/24 [phone]

## 2024-07-09 NOTE — H&P (Signed)
 [facility] H&P   Patient Demographics:    Derek Drake, is a 78 y.o. male  MRN: [id number]   DOB - 1946-01-16  Admit Date - 07/09/2024  Outpatient Primary MD for the patient is Patient, No Pcp Per  Outpatient Specialists: cardiology Dr. Debera  Patient coming from: Home  Chief Complaint  Patient presents with   Loss of Consciousness      HPI:    Derek Drake  is a 78 y.o. male, past medical history of paroxysmal A-fib, not on anticoagulation due to recurrent AVM GI bleed, type 2 diabetes mellitus, GERD, essential hypertension, NASH cirrhosis with esophageal varices on nadolol . - Patient presents to ED secondary to syncopal event, history of provide from the wife, patient walking back from the garage to the kitchen, he reports dyspnea, lightheadedness, fell initially on his knees then fell face down on the floor, he had total loss of consciousness for 5 minutes, she felt him gasping for air and having short of breath, instructed to perform CPR by 911, patient has been reporting dyspnea, fatigue, over the last couple days.  Been following with cardiology Dr. Debera, most recently seen yesterday, as well as following with hematology clinic for iron deficiency anemia and pancytopenia, anticoagulation due to recurrent GI bleed. - In ED patient was noted to be bradycardic, with first-degree AV block, prolonged PR, initial troponin is negative, labs significant for worsening creatinine 1.56, from baseline of 1.3, potassium at 5.2, blood glucose at 280, low platelet count at 76K at baseline, hemoglobin 8.4 at baseline, MCV 101 at baseline, white blood cell count of 3.7 at baseline, CT head with no acute findings, negative UA, hypothermic responded to warming blanket, TSH within normal limit, D-dimer reassuring at 0.55, hide hospitalist consulted to admit   Review of systems:      A full 10 point  Review of Systems was done, except as stated above, all other Review of Systems were negative.   With Past History of the following :    Past Medical History:  Diagnosis Date   Cirrhosis (HCC)    Immune to Hep A. Received Hep B vaccination in 2022.   Essential hypertension    GERD (gastroesophageal reflux disease)    Hypercholesteremia    PAF (paroxysmal atrial fibrillation) (HCC) 05/04/2014   Type 2 diabetes mellitus [facility])       Past Surgical History:  Procedure Laterality Date   BIOPSY  02/05/2017   Procedure: BIOPSY;  Surgeon: Shaaron Lamar HERO, MD;  [facility];  Service: Endoscopy;;  gastric colon   CHOLECYSTECTOMY     COLONOSCOPY N/A 05/13/2013   Procedure: COLONOSCOPY;  Surgeon: Lamar HERO Shaaron, MD;  [facility];  Service: Endoscopy;  Laterality: N/A;  8:30 AM   COLONOSCOPY N/A 02/05/2017   Procedure: COLONOSCOPY;  Surgeon: Shaaron Lamar HERO, MD;  [facility];  Service: Endoscopy;  Laterality: N/A;   COLONOSCOPY WITH PROPOFOL  N/A 03/26/2022   Procedure: COLONOSCOPY WITH PROPOFOL ;  Surgeon: Shaaron Lamar HERO, MD;  [facility];  Service: Endoscopy;  Laterality: N/A;  7:30am, moved to 7/12 @ 2:45   COLONOSCOPY WITH PROPOFOL  N/A 04/14/2022   Procedure: COLONOSCOPY WITH PROPOFOL ;  Surgeon: Cindie Carlin POUR, DO;  [facility];  Service: Endoscopy;  Laterality: N/A;   ESOPHAGOGASTRODUODENOSCOPY N/A 02/05/2017   Procedure: ESOPHAGOGASTRODUODENOSCOPY (EGD);  Surgeon: Shaaron Lamar HERO, MD;  [facility];  Service: Endoscopy;  Laterality: N/A;  730 - moved to 12:00    ESOPHAGOGASTRODUODENOSCOPY N/A 07/04/2020   Procedure: ESOPHAGOGASTRODUODENOSCOPY (EGD);  Surgeon: Shaaron Lamar HERO, MD;  [facility];  Service: Endoscopy;  Laterality: N/A;  12:45pm   ESOPHAGOGASTRODUODENOSCOPY (EGD) WITH PROPOFOL  N/A 01/23/2022   3 columns of grade 1-2 varices, portal gastropathy, normal duodenum. Begin Nadolol  20 mg daily.    GIVENS CAPSULE STUDY N/A 07/04/2020   Procedure: GIVENS CAPSULE STUDY;  Surgeon: Shaaron Lamar HERO, MD;  [facility];  Service: Endoscopy;  Laterality: N/A;  Elected not to place a capsule today   GIVENS CAPSULE STUDY N/A 04/12/2022   Procedure: GIVENS CAPSULE STUDY;  Surgeon: Eartha Angelia Sieving, MD;  [facility];  Service: Gastroenterology;  Laterality: N/A;   HOT HEMOSTASIS  03/26/2022   Procedure: HOT HEMOSTASIS (ARGON PLASMA COAGULATION/BICAP);  Surgeon: Shaaron Lamar HERO, MD;  [facility];  Service: Endoscopy;;   HOT HEMOSTASIS  04/14/2022   Procedure: HOT HEMOSTASIS (ARGON PLASMA COAGULATION/BICAP);  Surgeon: Cindie Carlin POUR, DO;  [facility];  Service: Endoscopy;;   IR BONE MARROW BIOPSY & ASPIRATION  04/18/2024   POLYPECTOMY  02/05/2017   Procedure: POLYPECTOMY;  Surgeon: Shaaron Lamar HERO, MD;  [facility];  Service: Endoscopy;;  colon      Social History:     Social History   Tobacco Use   Smoking status: Former    Current packs/day: 0.00    Average packs/day: 2.0 packs/day for 53.0 years (106.0 ttl pk-yrs)    Types: Cigarettes    Start date: 05/25/1956    Quit date: 05/25/2009    Years since quitting: 15.1   Smokeless tobacco: Current    Types: Chew  Substance Use Topics   Alcohol use: Not Currently        Family History :     Family History  Problem Relation Age of Onset   Prostate cancer Brother    Congestive Heart Failure Mother    Heart failure Brother    Colon cancer Neg Hx      Home Medications:   Prior to Admission medications   Medication Sig Start Date End Date Taking? Authorizing Provider  cholecalciferol (VITAMIN D3) 25 MCG (1000 UNIT) tablet Take 1,000 Units by mouth daily.    [provider]  clotrimazole-betamethasone (LOTRISONE) cream Apply topically 2 (two) times daily. 04/24/23   [provider]  [employer] IRON 325 (65 Fe) MG tablet Take 325 mg by mouth 2 (two) times  daily. 06/17/22   [provider]  diltiazem  (TIADYLT  ER) 360 MG 24 hr capsule TAKE 1 CAPSULE BY MOUTH EVERY DAY 04/12/24   Debera Jayson MATSU, MD  furosemide (LASIX) 20 MG tablet Take 20-40 mg by mouth daily as needed. 06/11/23   [provider]  gabapentin  (NEURONTIN ) 100 MG capsule Take 1 capsule (100 mg total) by mouth at bedtime. 06/29/24   Geofm Delon BRAVO, NP  glimepiride (AMARYL) 4 MG tablet Take 4 mg by mouth daily. 04/26/24   [provider]  Anselm Oil 500 MG CAPS Take 500 mg by mouth daily.    [provider]  losartan (COZAAR) 100 MG tablet Take 100 mg by mouth daily. 06/17/22   [provider]  metFORMIN (GLUCOPHAGE) 500 MG tablet Take 1,000 mg by mouth 2 (two) times daily. Patient taking differently: Take 1,000 mg by mouth 2 (two) times daily. Taking 2 at night and 1 in the morning 12/25/23   [provider]  milk thistle 175 MG tablet Take 175 mg by mouth daily.    [provider]  Multiple Vitamin (MULTIVITAMIN WITH MINERALS) TABS tablet Take 1 tablet by mouth daily.    [provider]  nadolol  (CORGARD ) 20 MG tablet Take 1 tablet (20 mg total) by mouth daily. 06/21/24   Kennedy Charmaine CROME, NP  pantoprazole  (PROTONIX ) 40 MG tablet TAKE 1 TABLET BY MOUTH EVERY DAY BEFORE BREAKFAST 05/30/24   Rourk, Lamar HERO, MD  predniSONE  (DELTASONE ) 20 MG tablet Take 1 tablet (20 mg total) by mouth daily with breakfast. 02/05/24   Geofm Delon BRAVO, NP  simvastatin  (ZOCOR ) 40 MG tablet Take 40 mg by mouth every evening.     [provider]  tamsulosin (FLOMAX) 0.4 MG CAPS capsule Take 1 capsule (0.4 mg total) by mouth daily. 06/29/24   Geofm Delon BRAVO, NP     Allergies:     Allergies  Allergen Reactions   Rabbit Protein Swelling    Face swelled up   Hydrocodone-Acetaminophen  Rash   Other Swelling and Rash    Dial soap   Ivp Dye [Iodinated Contrast Media] Other (See Comments)    Possible allergy... patient can not  confirm or deny contrast allergy, needs pre meds pre Dr. Grayson [facility] 01/27/2023)     Physical Exam:   Vitals  Blood pressure (!) 129/58, pulse (!) 56, temperature 97.6 F (36.4 C), temperature source Oral, resp. rate 15, height 5' 11 (1.803 m), weight 81.7 kg, SpO2 97%.   1. General frail elderly male, laying in bed, no apparent distress  2. Normal affect and insight, Not Suicidal or Homicidal, Awake Alert, Oriented X 3.  3. No F.N deficits, ALL C.Nerves Intact, Strength 5/5 all 4 extremities, Sensation intact all 4 extremities, Plantars down going.  4. Ears and Eyes appear Normal, Conjunctivae clear, PERRLA. Moist Oral Mucosa.  5. Supple Neck, No JVD, No cervical lymphadenopathy appriciated, No Carotid Bruits.+1 edema  6. Symmetrical Chest wall movement, Good air movement bilaterally, CTAB,   7. RRR, No Gallops, Rubs or Murmurs, No Parasternal Heave.  8. Positive Bowel Sounds, Abdomen Soft, No tenderness, No organomegaly appriciated,No rebound -guarding or rigidity.  9.  No Cyanosis, Normal Skin Turgor, No Skin Rash or Bruise.  10. Good muscle tone,  joints appear normal , no effusions, Normal ROM.     Data Review:    CBC Recent Labs  Lab 07/09/24 1540  WBC 3.7*  HGB 8.4*  HCT 25.2*  PLT 76*  MCV 101.6*  MCH 33.9  MCHC 33.3  RDW 14.8  LYMPHSABS 0.6*  MONOABS 0.8  EOSABS 0.1  BASOSABS 0.0   ------------------------------------------------------------------------------------------------------------------  Chemistries  Recent Labs  Lab 07/09/24 1540  NA 134*  K 5.2*  CL 101  CO2 20*  GLUCOSE 280*  BUN 26*  CREATININE 1.56*  CALCIUM  8.7*  AST 26  ALT 19  ALKPHOS 78  BILITOT 0.6   ------------------------------------------------------------------------------------------------------------------ estimated  creatinine clearance is 41.6 mL/min (A) (by C-G formula based on SCr of 1.56 mg/dL  (H)). ------------------------------------------------------------------------------------------------------------------ Recent Labs    07/09/24 1540  TSH 3.870    Coagulation profile No results for input(s): INR, PROTIME in the last 168 hours. ------------------------------------------------------------------------------------------------------------------- Recent Labs    07/09/24 1540  DDIMER 0.55*   -------------------------------------------------------------------------------------------------------------------  Cardiac Enzymes No results for input(s): CKMB, TROPONINI, MYOGLOBIN in the last 168 hours.  Invalid input(s): CK ------------------------------------------------------------------------------------------------------------------ No results found for: BNP   ---------------------------------------------------------------------------------------------------------------  Urinalysis    Component Value Date/Time   COLORURINE YELLOW 07/09/2024 1652   APPEARANCEUR CLEAR 07/09/2024 1652   LABSPEC 1.013 07/09/2024 1652   PHURINE 6.0 07/09/2024 1652   GLUCOSEU 150 (A) 07/09/2024 1652   HGBUR NEGATIVE 07/09/2024 1652   BILIRUBINUR NEGATIVE 07/09/2024 1652   KETONESUR NEGATIVE 07/09/2024 1652   PROTEINUR 100 (A) 07/09/2024 1652   NITRITE NEGATIVE 07/09/2024 1652   LEUKOCYTESUR NEGATIVE 07/09/2024 1652    ----------------------------------------------------------------------------------------------------------------   Imaging Results:    DG Chest Portable 1 View Result Date: 07/09/2024 CLINICAL DATA:  Syncopal episode. EXAM: PORTABLE CHEST 1 VIEW COMPARISON:  March 13, 2022 FINDINGS: The cardiac silhouette is mildly enlarged. Low lung volumes are noted. No acute infiltrate, pleural effusion or pneumothorax is identified. Radiopaque surgical clips are seen overlying the right upper quadrant. The visualized skeletal structures are unremarkable. IMPRESSION:  No active disease. Electronically Signed   By: Suzen Dials M.D.   On: 07/09/2024 16:48   CT Head Wo Contrast Result Date: 07/09/2024 CLINICAL DATA:  Status post trauma. EXAM: CT HEAD WITHOUT CONTRAST TECHNIQUE: Contiguous axial images were obtained from the base of the skull through the vertex without intravenous contrast. RADIATION DOSE REDUCTION: This exam was performed according to the departmental dose-optimization program which includes automated exposure control, adjustment of the mA and/or kV according to patient size and/or use of iterative reconstruction technique. COMPARISON:  June 19, 2023 FINDINGS: Brain: No evidence of acute infarction, hemorrhage, hydrocephalus, extra-axial collection or mass lesion/mass effect. Vascular: No hyperdense vessel or unexpected calcification. Skull: Normal. Negative for fracture or focal lesion. Sinuses/Orbits: No acute finding. Other: None. IMPRESSION: No acute intracranial pathology. Electronically Signed   By: Suzen Dials M.D.   On: 07/09/2024 16:46    EKG:  Vent. rate 56 BPM PR interval 341 ms QRS duration 116 ms QT/QTcB 478/462 ms P-R-T axes -61 -5 22 Sinus or ectopic atrial rhythm Prolonged PR interval Nonspecific intraventricular conduction delay    Assessment & Plan:    Principal Problem:   Syncope Active Problems:   PAF (paroxysmal atrial fibrillation) (HCC)   Thrombocytopenia (HCC)   Controlled type 2 diabetes mellitus without complication, without long-term current use of insulin  (HCC)   Essential hypertension, benign   Esophageal varices (HCC)   Iron deficiency anemia due to chronic blood loss   Liver cirrhosis secondary to NASH (HCC)   Gastroesophageal reflux disease   Mixed hyperlipidemia   Other pancytopenia (HCC)   MDS (myelodysplastic syndrome) (HCC)    Syncope - Most likely due to orthostasis or bradycardia. - Check orthostasis. - He is with first-degree AV block, heart rate in the 50s when he presents,  his EKG with nonspecific intraventricular conduction delay, monitor on telemetry, he is on both beta-blockers and calcium  channel blocker. - I will hold his Cardizem  CD.  This likely need to be held or decreased on discharge. - Continue with nadolol  for now given esophageal varices. - Check MRI brain. - Check 2D echo. - Cycle troponins. -  D-dimer within normal range for his age so no concern for VTE - Continue with gentle hydration. - TSH within normal level - Will check random cortisol level given he presents with hypothermia - Patient EKG appears with significant first-degree AV block with significantly prolonged PR, but this appears to be at baseline, continue to monitor on telemetry   Chronic iron deficiency anemia Macrocytic anemia Pancytopenia Leukopenia Macrocytic anemia Thrombocytopenia -Patient followed closely by hematology -Receiving IV iron transfusion as an outpatient. - Recent bone marrow biopsy for pancytopenia, nonrevealing. - No evidence of active GI bleed, hemoglobin at baseline, platelets at baseline, patient workup significant for normal folic acid  and high B12 level.   Mixed hyperlipidemia Continue statin  AKI on CKD stage IIIa -Hold losartan, avoid nephrotoxic medications, continue with IV fluids  Hyperkalemia -Hold losartan   Liver cirrhosis secondary to NASH (HCC) Continue with nadolol    Essential hypertension - Patient on nadolol , losartan and Cardizem  CD -I will continue with nadolol  given esophageal varices. - Hold Cardizem  CD as bradycardia possibly contributing to his syncope. - Hold losartan in the setting of AKI and hyperkalemia  Type 2 diabetes mellitus without complication, without long-term current use of insulin  (HCC) A1c-hold oral agents and keep on insulin  sliding scale during hospital stay    PAF (paroxysmal atrial fibrillation) (HCC) Currently in normal sinus rhythm Rate controlled Given concern for bradycardia causing syncope  will hold diltiazem  -Not anymore on apixaban  given recurrent history of GI bleed due to AVM requiring multiple transfusions per his primary cardiologist, was recently seen this week          DVT Prophylaxis  SCDs   AM Labs Ordered, also please review Full Orders  Family Communication: Admission, patients condition and plan of care including tests being ordered have been discussed with the patient and wife and daughter  who indicate understanding and agree with the plan and Code Status.  Code Status full code  Likely DC to home  Consults called: None  Admission status: Observation  Time spent in minutes : 70 minutes   Brayton Lye M.D on 07/09/2024 at 6:32 PM   [facility] - Office  [phone]

## 2024-07-09 NOTE — ED Notes (Signed)
 Due to the pt's temperature being 97.6 oral. Bear hugger was discontinued. Pt states 97.6 is his normal temperature. Plus the color in his face has returned.

## 2024-07-09 NOTE — ED Notes (Signed)
 Due to the pt's temperature being 94.9 rectal. Bear hugger was applied.

## 2024-07-10 ENCOUNTER — Observation Stay (HOSPITAL_BASED_OUTPATIENT_CLINIC_OR_DEPARTMENT_OTHER)

## 2024-07-10 ENCOUNTER — Other Ambulatory Visit (HOSPITAL_COMMUNITY): Payer: Self-pay | Admitting: *Deleted

## 2024-07-10 DIAGNOSIS — E785 Hyperlipidemia, unspecified: Secondary | ICD-10-CM

## 2024-07-10 DIAGNOSIS — K7469 Other cirrhosis of liver: Secondary | ICD-10-CM

## 2024-07-10 DIAGNOSIS — I10 Essential (primary) hypertension: Secondary | ICD-10-CM

## 2024-07-10 DIAGNOSIS — N179 Acute kidney failure, unspecified: Secondary | ICD-10-CM

## 2024-07-10 DIAGNOSIS — K219 Gastro-esophageal reflux disease without esophagitis: Secondary | ICD-10-CM

## 2024-07-10 DIAGNOSIS — D469 Myelodysplastic syndrome, unspecified: Secondary | ICD-10-CM

## 2024-07-10 DIAGNOSIS — K7581 Nonalcoholic steatohepatitis (NASH): Secondary | ICD-10-CM | POA: Diagnosis not present

## 2024-07-10 DIAGNOSIS — I480 Paroxysmal atrial fibrillation: Secondary | ICD-10-CM | POA: Diagnosis not present

## 2024-07-10 DIAGNOSIS — R55 Syncope and collapse: Secondary | ICD-10-CM

## 2024-07-10 DIAGNOSIS — D500 Iron deficiency anemia secondary to blood loss (chronic): Secondary | ICD-10-CM

## 2024-07-10 DIAGNOSIS — E1169 Type 2 diabetes mellitus with other specified complication: Secondary | ICD-10-CM

## 2024-07-10 LAB — BASIC METABOLIC PANEL WITH GFR
Anion gap: 10 (ref 5–15)
BUN: 20 mg/dL (ref 8–23)
CO2: 22 mmol/L (ref 22–32)
Calcium: 8.7 mg/dL — ABNORMAL LOW (ref 8.9–10.3)
Chloride: 106 mmol/L (ref 98–111)
Creatinine, Ser: 1.17 mg/dL (ref 0.61–1.24)
GFR, Estimated: >60 mL/min (ref >60)
Glucose, Bld: 145 mg/dL — ABNORMAL HIGH (ref 70–99)
Potassium: 4.3 mmol/L (ref 3.5–5.1)
Sodium: 138 mmol/L (ref 135–145)

## 2024-07-10 LAB — ECHOCARDIOGRAM COMPLETE
Area-P 1/2: 3.12 cm2
Height: 71 in
S' Lateral: 2.4 cm
Weight: 3012.37 [oz_av]

## 2024-07-10 LAB — GLUCOSE, CAPILLARY
Glucose-Capillary: 130 mg/dL — ABNORMAL HIGH (ref 70–99)
Glucose-Capillary: 190 mg/dL — ABNORMAL HIGH (ref 70–99)
Glucose-Capillary: 146 mg/dL — ABNORMAL HIGH (ref 70–99)
Glucose-Capillary: 144 mg/dL — ABNORMAL HIGH (ref 70–99)

## 2024-07-10 LAB — CORTISOL: Cortisol, Plasma: 25.1 ug/dL

## 2024-07-10 LAB — CBC
HCT: 21.7 % — ABNORMAL LOW (ref 39.0–52.0)
Hemoglobin: 7.1 g/dL — ABNORMAL LOW (ref 13.0–17.0)
MCH: 33 pg (ref 26.0–34.0)
MCHC: 32.7 g/dL (ref 30.0–36.0)
MCV: 100.9 fL — ABNORMAL HIGH (ref 80.0–100.0)
Platelets: 54 K/uL — ABNORMAL LOW (ref 150–400)
RBC: 2.15 MIL/uL — ABNORMAL LOW (ref 4.22–5.81)
RDW: 14.6 % (ref 11.5–15.5)
WBC: 2.4 K/uL — ABNORMAL LOW (ref 4.0–10.5)
nRBC: 0 % (ref 0.0–0.2)

## 2024-07-10 LAB — HEMOGLOBIN A1C
Hgb A1c MFr Bld: 4.8 % (ref 4.8–5.6)
Mean Plasma Glucose: 91.06 mg/dL

## 2024-07-10 MED ORDER — LOSARTAN POTASSIUM 50 MG PO TABS
50.0000 mg | ORAL_TABLET | Freq: Every day | ORAL | Status: DC
  Administered 2024-07-10 – 2024-07-11 (×2): 50 mg via ORAL
  Filled 2024-07-10 (×2): qty 1

## 2024-07-10 NOTE — Plan of Care (Signed)

## 2024-07-10 NOTE — Assessment & Plan Note (Signed)
 No signs of decompensation of liver disease. Chronic portal hypertension

## 2024-07-10 NOTE — Progress Notes (Signed)
*  PRELIMINARY RESULTS* Echocardiogram 2D Echocardiogram has been performed.  Teresa Aida PARAS 07/10/2024, 9:51 AM

## 2024-07-10 NOTE — Plan of Care (Signed)
   Problem: Education: Goal: Knowledge of General Education information will improve Description: Including pain rating scale, medication(s)/side effects and non-pharmacologic comfort measures Outcome: Progressing   Problem: Clinical Measurements: Goal: Diagnostic test results will improve Outcome: Progressing   Problem: Activity: Goal: Risk for activity intolerance will decrease Outcome: Progressing

## 2024-07-10 NOTE — Evaluation (Signed)
 Physical Therapy Evaluation Patient Details Name: Derek Drake MRN: [id number] DOB: 1946-01-30 Today's Date: 07/10/2024  History of Present Illness  Derek Drake  is a 78 y.o. male, past medical history of paroxysmal A-fib, not on anticoagulation due to recurrent AVM GI bleed, type 2 diabetes mellitus, GERD, essential hypertension, NASH cirrhosis with esophageal varices on nadolol .  - Patient presents to ED secondary to syncopal event, history of provide from the wife, patient walking back from the garage to the kitchen, he reports dyspnea, lightheadedness, fell initially on his knees then fell face down on the floor, he had total loss of consciousness for 5 minutes, she felt him gasping for air and having short of breath, instructed to perform CPR by 911, patient has been reporting dyspnea, fatigue, over the last couple days.  Been following with cardiology Dr. Debera, most recently seen yesterday, as well as following with hematology clinic for iron deficiency anemia and pancytopenia, anticoagulation due to recurrent GI bleed.  - In ED patient was noted to be bradycardic, with first-degree AV block, prolonged PR, initial troponin is negative, labs significant for worsening creatinine 1.56, from baseline of 1.3, potassium at 5.2, blood glucose at 280, low platelet count at 76K at baseline, hemoglobin 8.4 at baseline, MCV 101 at baseline, white blood cell count of 3.7 at baseline, CT head with no acute findings, negative UA, hypothermic responded to warming blanket, TSH within normal limit, D-dimer reassuring at 0.55, hide hospitalist consulted to admit   Clinical Impression  Patient agreeable to PT evaluation. Patient reports at baseline he is independent with ADLs, and a community ambulator without AD. On this date, patient is supervision/modified independent with all bed mobility, functional transfer, toileting/pericare (in standing), and ambulation without AD. Of note, pt has one instance of transient  dizziness with second sit>stand transfer, BP seated: 150/92 and standing 146/71. No other instances throughout session. Pt mildly unsteady during gait but no overt LOB, reports near baseline. Pt demo good strength throughout. Returned to bed at end of session with call button within reach. Patient does not present with urgent need for skilled physical therapy at this time. Patient discharged to care of nursing for ambulation daily as tolerated for length of stay.       If plan is discharge home, recommend the following: Assist for transportation;Help with stairs or ramp for entrance   Can travel by private vehicle        Equipment Recommendations None recommended by PT  Recommendations for Other Services       Functional Status Assessment Patient has had a recent decline in their functional status and demonstrates the ability to make significant improvements in function in a reasonable and predictable amount of time.     Precautions / Restrictions Precautions Precautions: Fall Recall of Precautions/Restrictions: Intact Restrictions Weight Bearing Restrictions Per Provider Order: No      Mobility  Bed Mobility Overal bed mobility: Needs Assistance Bed Mobility: Supine to Sit, Sit to Supine     Supine to sit: Supervision, Modified independent (Device/Increase time) Sit to supine: Supervision, Modified independent (Device/Increase time)   General bed mobility comments: Supervision for safety but per clinical judgement, pt Independent. HOB flat, mild use of rails    Transfers Overall transfer level: Needs assistance Equipment used: None Transfers: Sit to/from Stand Sit to Stand: Modified independent (Device/Increase time), Supervision           General transfer comment: initial stand slightly impulsive secondary to pt reporting urgency with urination, uses  bed rails and wall for support. Second STS pt reporting tranisent dizziness, BP taken seated 150/92 --> standing  146/71    Ambulation/Gait Ambulation/Gait assistance: Supervision, Modified independent (Device/Increase time) Gait Distance (Feet): 125 Feet Assistive device: None Gait Pattern/deviations: Step-through pattern, Drifts right/left Gait velocity: WFL     General Gait Details: Pt ambulates in room and hallway without AD, CGA initially for safety as pt slightly unsteady with initial steps but improves with further ambulation. prog to supervision/mod Independent. Pt drifts R/L at times but very mild. Pt reports no other dizziness during session or SOB. Reports feeling near baseline.  Stairs            Wheelchair Mobility     Tilt Bed    Modified Rankin (Stroke Patients Only)       Balance Overall balance assessment: Mild deficits observed, not formally tested         Pertinent Vitals/Pain Pain Assessment Pain Assessment: No/denies pain    Home Living Family/patient expects to be discharged to:: Private residence Living Arrangements: Spouse/significant other Available Help at Discharge: Family;Available 24 hours/day Type of Home: House         Home Layout: One level;Laundry or work area in [employer] Equipment: [place] - single point;Grab bars - tub/shower Additional Comments: Pt reports he doesnt currently use any AD besides walking sticks if he hikes but thats been a while. 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 Reports his wife has a walking stick and/or cane    Prior Function Prior Level of Function : Independent/Modified Independent;Driving     Mobility Comments: Community ambulator without AD, drives, reports no other falls ADLs Comments: Independent     Extremity/Trunk Assessment   Upper Extremity Assessment Upper Extremity Assessment: Overall WFL for tasks assessed (Shoulder ROM WNL, MMT 4/5)    Lower Extremity Assessment Lower Extremity Assessment: Overall WFL for tasks assessed (ankle DF MMT 5/5 bilaterally, hip flexion MMT 4/5  bilaterally)    Cervical / Trunk Assessment Cervical / Trunk Assessment: Normal  Communication   Communication Communication: No apparent difficulties    Cognition Arousal: Alert Behavior During Therapy: WFL for tasks assessed/performed   PT - Cognitive impairments: No apparent impairments   Following commands: Intact       Cueing Cueing Techniques: Verbal cues, Tactile cues, Visual cues     General Comments      Exercises     Assessment/Plan    PT Assessment Patient does not need any further PT services  PT Problem List         PT Treatment Interventions      PT Goals (Current goals can be found in the Care Plan section)  Acute Rehab PT Goals Patient Stated Goal: return home PT Goal Formulation: With patient Time For Goal Achievement: 07/15/24 Potential to Achieve Goals: Good    Frequency       Co-evaluation               AM-PAC PT 6 Clicks Mobility  Outcome Measure Help needed turning from your back to your side while in a flat bed without using bedrails?: None Help needed moving from lying on your back to sitting on the side of a flat bed without using bedrails?: None Help needed moving to and from a bed to a chair (including a wheelchair)?: None Help needed standing up from a chair using your arms (e.g., wheelchair or bedside chair)?: None Help needed to walk in hospital room?: None Help needed  climbing 3-5 steps with a railing? : None 6 Click Score: 24    End of Session   Activity Tolerance: Patient tolerated treatment well Patient left: in bed;with call bell/phone within reach Nurse Communication: Mobility status PT Visit Diagnosis: Dizziness and giddiness (R42)    Time: 9063-9043 PT Time Calculation (min) (ACUTE ONLY): 20 min   Charges:   PT Evaluation $PT Eval Low Complexity: 1 Low   PT General Charges $$ ACUTE PT VISIT: 1 Visit        11:36 AM, 07/10/24 Rosaria Settler, PT, DPT [place] with [facility]

## 2024-07-10 NOTE — Care Management Obs Status (Signed)
 MEDICARE OBSERVATION STATUS NOTIFICATION   Patient Details  Name: Derek Drake MRN: [id number] Date of Birth: 01/14/1946   Medicare Observation Status Notification Given:  Yes    Hoy DELENA Bigness, LCSW 07/10/2024, 12:03 PM

## 2024-07-10 NOTE — Assessment & Plan Note (Signed)
 Continue pantoprazole .

## 2024-07-10 NOTE — Assessment & Plan Note (Signed)
 Continue rate control with nadolol .  Patient not on anticoagulation due to history of GI bleeding due to AVM.

## 2024-07-10 NOTE — Assessment & Plan Note (Signed)
 Pancytopenia, follow up as outpatient.

## 2024-07-10 NOTE — Assessment & Plan Note (Addendum)
 Suspected to be related to bradycardia.  Echocardiogram with preserved LV systolic function EF 60 to 65%, mild LVH, EA fusion, RV systolic function preserved, no significant valvular disease, normal size LA and RA.   Diltiazem  on hold with good toleration Telemetry with sinus rhythm with improvement in AV conduction Plan to continue telemetry monitoring for 24 hrs./ Check EKG today Keep K at 4 and Mg at 2

## 2024-07-10 NOTE — Assessment & Plan Note (Signed)
 Cell count down to 7,1 with no signs of bleeding.  Likely hemo concentrated on admission  Follow cell count in am.

## 2024-07-10 NOTE — Assessment & Plan Note (Signed)
 Hyperkalemia and hyponatremia.   Improved renal function to serum cr at 1,17 with K at 4,3 and serum bicarbonate at 22 Na 138

## 2024-07-10 NOTE — Progress Notes (Signed)
  Transition of Care [facility]) Screening Note   Patient Details  Name: AYDIAN DIMMICK Date of Birth: October 13, 1945   Transition of Care [facility]) CM/SW Contact:    Hoy DELENA Bigness, LCSW Phone Number: 07/10/2024, 12:03 PM    Transition of Care Department [facility]) has reviewed patient and no TOC needs have been identified at this time. We will continue to monitor patient advancement through interdisciplinary progression rounds. If new patient transition needs arise, please place a TOC consult.    07/10/24 1203  TOC Brief Assessment  Insurance and Status Reviewed  Patient has primary care physician Yes  Home environment has been reviewed Home w/ spouse  Prior level of function: Independent  Prior/Current Home Services No current home services  Social Drivers of Health Review SDOH reviewed no interventions necessary  Readmission risk has been reviewed Yes  Transition of care needs no transition of care needs at this time

## 2024-07-10 NOTE — Progress Notes (Signed)
 Progress Note   Patient: Derek Drake FMW:[id number] DOB: February 20, 1946 DOA: 07/09/2024     0 DOS: the patient was seen and examined on 07/10/2024   Brief hospital course: Derek Drake was admitted to the hospital with the working diagnosis of syncope.   78 yo male with the past medical history of paroxysmal atrial fibrillation, T2DM, NASH with cirrhosis and portal hypertension, GERD and hypertension who presented after a syncope episode.  Patient had a syncope while ambulating, event preceded by dyspnea and lightheadedness. He initially fell on his knees, than fell forward hitting the floor. Apparently he recovered his consciousness after 5 minutes. During that time he had agonal breathing, and her wife performed CPR.  On his initial physical examination his blood pressure was 129/58, HR 56, RR 15 and 02 saturation 97% Lungs with no wheezing or rhonchi, heart with S1 and S2 present and regular, abdomen with no distention and no lower extremity edema. Neurologically he was not focal.   Na 134, K 5.2 Cl 101 bicarbonate 20 glucose 280 bun 26 cr 1,56  High sensitive troponin < 15  Wbc 3,7 hgb 8,4 plt 76  Urine analysis SG 1,013, protein 100, leukocytes negative, negative hgb   Chest radiograph with hypoinflation, positive cardiomegaly with no effusions or infiltrates.   EKG 56 bpm, left axis deviation, left anterior fascicular block, qtc 462, sinus rhythm with 1st degree AV block, no significant ST segment or T wave changes.   CT head no acute intracranial pathology Brain MRI with no acute intracranial abnormality.   Diltiazem  has been stopped with improvement in bradycardia and resolution of 1st degree AV block.   Assessment and Plan: * Syncope Suspected to be related to bradycardia.  Echocardiogram with preserved LV systolic function EF 60 to 65%, mild LVH, EA fusion, RV systolic function preserved, no significant valvular disease, normal size LA and RA.   Diltiazem  on hold with good  toleration Telemetry with sinus rhythm with improvement in AV conduction Plan to continue telemetry monitoring for 24 hrs./ Check EKG today Keep K at 4 and Mg at 2  PAF (paroxysmal atrial fibrillation) (HCC) Continue rate control with nadolol .  Patient not on anticoagulation due to history of GI bleeding due to AVM.   Essential hypertension, benign Continue blood pressure control with nadolol  and resume losartan at 50 mg for now.    AKI (acute kidney injury) Hyperkalemia and hyponatremia.   Improved renal function to serum cr at 1,17 with K at 4,3 and serum bicarbonate at 22 Na 138   Liver cirrhosis secondary to NASH (HCC) No signs of decompensation of liver disease. Chronic portal hypertension   Type 2 diabetes mellitus with hyperlipidemia (HCC) Continue glucose cover and monitoring with insulin  sliding scale Continue statin   Gastroesophageal reflux disease Continue pantoprazole    MDS (myelodysplastic syndrome) (HCC) Pancytopenia, follow up as outpatient.   Iron deficiency anemia due to chronic blood loss Cell count down to 7,1 with no signs of bleeding.  Likely hemo concentrated on admission  Follow cell count in am.       Subjective: Patient is feeling better, no chest pain and no dyspnea, no palpitations   Physical Exam: Vitals:   07/09/24 2255 07/10/24 0334 07/10/24 0730 07/10/24 1035  BP: (!) 137/47 134/68 (!) 141/68 (!) 145/65  Pulse: 72 81 80 75  Resp: 14 16 18 18   Temp: 97.9 F (36.6 C) 97.7 F (36.5 C) 98 F (36.7 C)   TempSrc: Oral Oral Oral   SpO2:  98% 97% 99%   Weight:      Height:       Neurology awake and alert ENT with mild pallor with no icterus Cardiovascular with S1 and S2 present and regular with no gallops, rubs or murmurs Respiratory with no rales or rhonchi, no wheezing Abdomen with no distention  No lower extremity edema  Data Reviewed:    Family Communication: I spoke with patient's wife at the bedside, we talked in detail  about patient's condition, plan of care and prognosis and all questions were addressed.   Disposition: Status is: Observation The patient remains OBS appropriate and will d/c before 2 midnights.  Planned Discharge Destination: Home     Author: Elidia Toribio Furnace, MD 07/10/2024 1:42 PM  For on call review [email or web address].

## 2024-07-10 NOTE — Assessment & Plan Note (Signed)
 Continue glucose cover and monitoring with insulin  sliding scale.   Continue statin

## 2024-07-10 NOTE — Assessment & Plan Note (Signed)
 Continue blood pressure control with nadolol  and resume losartan at 50 mg for now.

## 2024-07-10 NOTE — Hospital Course (Addendum)
 Mr. Arps was admitted to the hospital with the working diagnosis of syncope.   78 yo male with the past medical history of paroxysmal atrial fibrillation, T2DM, NASH with cirrhosis and portal hypertension, GERD and hypertension who presented after a syncope episode.  Patient had a syncope while ambulating, event preceded by dyspnea and lightheadedness. He initially fell on his knees, than fell forward hitting the floor. Apparently he recovered his consciousness after 5 minutes. During that time he had agonal breathing, and her wife performed CPR.  On his initial physical examination his blood pressure was 129/58, HR 56, RR 15 and 02 saturation 97% Lungs with no wheezing or rhonchi, heart with S1 and S2 present and regular, abdomen with no distention and no lower extremity edema. Neurologically he was not focal.   Na 134, K 5.2 Cl 101 bicarbonate 20 glucose 280 bun 26 cr 1,56  High sensitive troponin < 15  Wbc 3,7 hgb 8,4 plt 76  Urine analysis SG 1,013, protein 100, leukocytes negative, negative hgb   Chest radiograph with hypoinflation, positive cardiomegaly with no effusions or infiltrates.   EKG 56 bpm, left axis deviation, left anterior fascicular block, qtc 462, sinus rhythm with 1st degree AV block, no significant ST segment or T wave changes.   CT head no acute intracranial pathology Brain MRI with no acute intracranial abnormality.   Diltiazem  has been stopped with improvement in bradycardia and resolution of 1st degree AV block.

## 2024-07-11 DIAGNOSIS — R55 Syncope and collapse: Secondary | ICD-10-CM | POA: Diagnosis not present

## 2024-07-11 DIAGNOSIS — I480 Paroxysmal atrial fibrillation: Secondary | ICD-10-CM | POA: Diagnosis not present

## 2024-07-11 DIAGNOSIS — I10 Essential (primary) hypertension: Secondary | ICD-10-CM | POA: Diagnosis not present

## 2024-07-11 DIAGNOSIS — K219 Gastro-esophageal reflux disease without esophagitis: Secondary | ICD-10-CM | POA: Diagnosis not present

## 2024-07-11 LAB — BASIC METABOLIC PANEL WITH GFR
Anion gap: 10 (ref 5–15)
BUN: 16 mg/dL (ref 8–23)
CO2: 25 mmol/L (ref 22–32)
Calcium: 8.9 mg/dL (ref 8.9–10.3)
Chloride: 106 mmol/L (ref 98–111)
Creatinine, Ser: 1.08 mg/dL (ref 0.61–1.24)
GFR, Estimated: >60 mL/min (ref >60)
Glucose, Bld: 146 mg/dL — ABNORMAL HIGH (ref 70–99)
Potassium: 4.3 mmol/L (ref 3.5–5.1)
Sodium: 140 mmol/L (ref 135–145)

## 2024-07-11 LAB — GLUCOSE, CAPILLARY
Glucose-Capillary: 151 mg/dL — ABNORMAL HIGH (ref 70–99)
Glucose-Capillary: 209 mg/dL — ABNORMAL HIGH (ref 70–99)

## 2024-07-11 LAB — CBC
HCT: 24.7 % — ABNORMAL LOW (ref 39.0–52.0)
Hemoglobin: 8 g/dL — ABNORMAL LOW (ref 13.0–17.0)
MCH: 32.8 pg (ref 26.0–34.0)
MCHC: 32.4 g/dL (ref 30.0–36.0)
MCV: 101.2 fL — ABNORMAL HIGH (ref 80.0–100.0)
Platelets: 56 K/uL — ABNORMAL LOW (ref 150–400)
RBC: 2.44 MIL/uL — ABNORMAL LOW (ref 4.22–5.81)
RDW: 14.6 % (ref 11.5–15.5)
WBC: 2.5 K/uL — ABNORMAL LOW (ref 4.0–10.5)
nRBC: 0 % (ref 0.0–0.2)

## 2024-07-11 LAB — MAGNESIUM: Magnesium: 1.9 mg/dL (ref 1.7–2.4)

## 2024-07-11 MED ORDER — FUROSEMIDE 20 MG PO TABS: 20.0000 mg | ORAL_TABLET | Freq: Every day | ORAL | Status: AC | PRN

## 2024-07-11 MED ORDER — METFORMIN HCL 500 MG PO TABS: 500.0000 mg | ORAL_TABLET | Freq: Two times a day (BID) | ORAL | Status: DC

## 2024-07-11 NOTE — Progress Notes (Signed)
Discharge instructions reviewed with patient and wife. Both verbalized understanding of instructions, patient discharged home with wife in stable condition.

## 2024-07-11 NOTE — Discharge Summary (Signed)
 Physician Discharge Summary   Patient: Derek Drake MRN: [id number] DOB: 1945/11/12  Admit date:     07/09/2024  Discharge date: 07/11/24  Discharge Physician: Eric Nunnery   PCP: Patient, No Pcp Per   Recommendations at discharge:  Repeat basic metabolic panel to follow electrolytes and renal function Make sure patient follow-up with cardiology service as instructed Repeat CBC to follow hemoglobin trend/stability. Continue to follow CBG fluctuation and adjust hypoglycemic regimen as required. Reassess blood pressure and adjust antihypertensive regimen as needed.  Discharge Diagnoses: Principal Problem:   Syncope and collapse Active Problems:   PAF (paroxysmal atrial fibrillation) (HCC)   Essential hypertension, benign   AKI (acute kidney injury)   Liver cirrhosis secondary to NASH (HCC)   Type 2 diabetes mellitus with hyperlipidemia (HCC)   Gastroesophageal reflux disease   MDS (myelodysplastic syndrome) (HCC)   Iron deficiency anemia due to chronic blood loss  Brief Hospital admission narrative: As per H&P written by Dr. Sherlon on 07/09/2024 Derek Drake  is a 78 y.o. male, past medical history of paroxysmal A-fib, not on anticoagulation due to recurrent AVM GI bleed, type 2 diabetes mellitus, GERD, essential hypertension, NASH cirrhosis with esophageal varices on nadolol . - Patient presents to ED secondary to syncopal event, history of provide from the wife, patient walking back from the garage to the kitchen, he reports dyspnea, lightheadedness, fell initially on his knees then fell face down on the floor, he had total loss of consciousness for 5 minutes, she felt him gasping for air and having short of breath, instructed to perform CPR by 911, patient has been reporting dyspnea, fatigue, over the last couple days.  Been following with cardiology Dr. Debera, most recently seen yesterday, as well as following with hematology clinic for iron deficiency anemia and pancytopenia,  anticoagulation due to recurrent GI bleed. - In ED patient was noted to be bradycardic, with first-degree AV block, prolonged PR, initial troponin is negative, labs significant for worsening creatinine 1.56, from baseline of 1.3, potassium at 5.2, blood glucose at 280, low platelet count at 76K at baseline, hemoglobin 8.4 at baseline, MCV 101 at baseline, white blood cell count of 3.7 at baseline, CT head with no acute findings, negative UA, hypothermic responded to warming blanket, TSH within normal limit, D-dimer reassuring at 0.55, TSH hospitalist consulted to admit for further evaluation and management.  Assessment and Plan:  Syncope -Suspected to be related to bradycardia.  -Patient with underlying history of paroxysmal atrial fibrillation. -Echocardiogram with preserved LV systolic function EF 60 to 65%, mild LVH, EA fusion, RV systolic function preserved, no significant valvular disease, normal size LA and RA.  -Telemetry demonstrating first-degree AV block. - Excellent control to patient's rate and also blood pressure while only using nadolol . - Cardizem  has been discontinued - Case discussed with cardiology service and they will follow patient up after discharge with involvement of EP if required. - Continue to closely follow electrolytes and maintain potassium as close to 4 and magnesium close to 2 as possible.   PAF (paroxysmal atrial fibrillation) (HCC) -Continue rate control with nadolol .  -Patient not on anticoagulation due to history of GI bleeding due to AVM.    Essential hypertension, benign -Stable and well-controlled with the use of manual - Heart healthy/low-sodium diet discussed with patient. - Continue to follow vital signs and further adjust antihypertensive regimen as required.     AKI (acute kidney injury); with Hyperkalemia and hyponatremia.  -Improvement appreciated with resuscitation.  Patient advised to maintain  adequate hydration and to continue following heart  healthy/low-sodium diet.   - As needed Lasix as previously recommended by GI encouraged.  Liver cirrhosis secondary to NASH (HCC) -No signs of decompensation of liver disease. -Chronic portal hypertension  -Continue treatment with nadolol  and PPI - Continue patient follow-up with GI service. -Heart healthy/low-sodium diet discussed with patient.   Type 2 diabetes mellitus with hyperlipidemia (HCC) -Resume home hypoglycemic regimen - Continue statin.   Gastroesophageal reflux disease -Continue pantoprazole     MDS (myelodysplastic syndrome) (HCC) -Stable - Continue to follow complete blood count levels.   Iron deficiency anemia due to chronic blood loss -Continue to follow hemoglobin trend - Continue iron supplementation.   History of chronic diastolic heart failure - Stable and compensated - Continue to follow low-sodium diet and volume status - Patient euvolemic at discharge.  Consultants: None Procedures performed: See below for x-ray reports. Disposition: Home Diet recommendation: Heart healthy/low-sodium diet.  DISCHARGE MEDICATION: Allergies as of 07/11/2024       Reactions   Rabbit Protein Swelling   Face swelled up   Hydrocodone-acetaminophen  Rash   Other Swelling, Rash   Dial soap   Ivp Dye [iodinated Contrast Media] Other (See Comments)   Possible allergy... patient can not confirm or deny contrast allergy, needs pre meds pre Dr. Grayson (jennifer Wonda 01/27/2023)        Medication List     STOP taking these medications    Tiadylt  ER 360 MG 24 hr capsule Generic drug: diltiazem        TAKE these medications    cholecalciferol 25 MCG (1000 UNIT) tablet Commonly known as: VITAMIN D3 Take 1,000 Units by mouth daily.   [employer] Iron 325 (65 Fe) MG tablet Generic drug: ferrous sulfate  Take 325 mg by mouth 2 (two) times daily.   furosemide 20 MG tablet Commonly known as: LASIX Take 1 tablet (20 mg total) by mouth daily as needed. What changed: how  much to take   gabapentin  100 MG capsule Commonly known as: NEURONTIN  Take 1 capsule (100 mg total) by mouth at bedtime.   glimepiride 4 MG tablet Commonly known as: AMARYL Take 4 mg by mouth daily.   Krill Oil 500 MG Caps Take 500 mg by mouth daily.   losartan 100 MG tablet Commonly known as: COZAAR Take 100 mg by mouth daily.   metFORMIN 500 MG tablet Commonly known as: GLUCOPHAGE Take 1-2 tablets (500-1,000 mg total) by mouth 2 (two) times daily. Taking 2 at night and 1 in the morning   milk thistle 175 MG tablet Take 175 mg by mouth daily.   multivitamin with minerals Tabs tablet Take 1 tablet by mouth daily.   nadolol  20 MG tablet Commonly known as: CORGARD  Take 1 tablet (20 mg total) by mouth daily.   pantoprazole  40 MG tablet Commonly known as: PROTONIX  TAKE 1 TABLET BY MOUTH EVERY DAY BEFORE BREAKFAST What changed: See the new instructions.   simvastatin  40 MG tablet Commonly known as: ZOCOR  Take 40 mg by mouth every evening.   tamsulosin 0.4 MG Caps capsule Commonly known as: FLOMAX Take 1 capsule (0.4 mg total) by mouth daily.        Discharge Exam: Filed Weights   07/09/24 1535 07/09/24 2000  Weight: 81.7 kg 85.4 kg   General exam: Alert, awake, following commands appropriately and in no acute distress. Respiratory system: Clear to auscultation. Respiratory effort normal.  Good saturation on room air. Cardiovascular system:RRR. No rubs or gallops; no JVD. Gastrointestinal  system: Abdomen is nondistended, soft and nontender. No organomegaly or masses felt. Normal bowel sounds heard. Central nervous system: Moving 4 limbs spontaneously.  No focal neurological deficits. Extremities: No cyanosis or clubbing. Skin: No petechiae. Psychiatry: Judgement and insight appear normal. Mood & affect appropriate.    Condition at discharge: Stable and improved.  The results of significant diagnostics from this hospitalization (including imaging,  microbiology, ancillary and laboratory) are listed below for reference.   Imaging Studies: ECHOCARDIOGRAM COMPLETE Result Date: 07/10/2024    ECHOCARDIOGRAM REPORT   Patient Name:   Derek Drake Date of Exam: 07/10/2024 Medical Rec #:  [id number]     Height:       71.0 in Accession #:    [id number]    Weight:       188.3 lb Date of Birth:  09-01-1946      BSA:          2.055 m Patient Age:    78 years      BP:           139/68 mmHg Patient Gender: M             HR:           81 bpm. Exam Location:  [facility] Procedure: 2D Echo, Cardiac Doppler and Color Doppler (Both Spectral and Color            Flow Doppler were utilized during procedure). Indications:    Syncope R55  History:        Patient has prior history of Echocardiogram examinations, most                 recent 01/25/2021. Arrythmias:Atrial Fibrillation,                 Signs/Symptoms:Syncope; Risk Factors:Hypertension, Diabetes and                 Dyslipidemia.  Sonographer:    Aida Pizza RCS Referring Phys: [phone] DAWOOD S ELGERGAWY IMPRESSIONS  1. Left ventricular ejection fraction, by estimation, is 60 to 65%. The left ventricle has normal function. The left ventricle has no regional wall motion abnormalities. There is mild concentric left ventricular hypertrophy. Indeterminate diastolic filling due to E-A fusion.  2. Right ventricular systolic function is normal. The right ventricular size is normal. There is normal pulmonary artery systolic pressure. The estimated right ventricular systolic pressure is 31.5 mmHg.  3. The mitral valve is grossly normal. Mild mitral valve regurgitation. No evidence of mitral stenosis.  4. The aortic valve is tricuspid. Aortic valve regurgitation is not visualized. No aortic stenosis is present.  5. The inferior vena cava is normal in size with greater than 50% respiratory variability, suggesting right atrial pressure of 3 mmHg. Comparison(s): No significant change from prior study. FINDINGS  Left Ventricle: Left  ventricular ejection fraction, by estimation, is 60 to 65%. The left ventricle has normal function. The left ventricle has no regional wall motion abnormalities. The left ventricular internal cavity size was normal in size. There is  mild concentric left ventricular hypertrophy. Indeterminate diastolic filling due to E-A fusion. Right Ventricle: The right ventricular size is normal. No increase in right ventricular wall thickness. Right ventricular systolic function is normal. There is normal pulmonary artery systolic pressure. The tricuspid regurgitant velocity is 2.67 m/s, and  with an assumed right atrial pressure of 3 mmHg, the estimated right ventricular systolic pressure is 31.5 mmHg. Left Atrium: Left atrial size was normal in size. Right  Atrium: Right atrial size was normal in size. Pericardium: There is no evidence of pericardial effusion. Mitral Valve: The mitral valve is grossly normal. Mild mitral valve regurgitation. No evidence of mitral valve stenosis. Tricuspid Valve: The tricuspid valve is grossly normal. Tricuspid valve regurgitation is mild . No evidence of tricuspid stenosis. Aortic Valve: The aortic valve is tricuspid. Aortic valve regurgitation is not visualized. No aortic stenosis is present. Pulmonic Valve: The pulmonic valve was grossly normal. Pulmonic valve regurgitation is not visualized. No evidence of pulmonic stenosis. Aorta: The aortic root is normal in size and structure. Venous: The inferior vena cava is normal in size with greater than 50% respiratory variability, suggesting right atrial pressure of 3 mmHg. IAS/Shunts: The atrial septum is grossly normal.  LEFT VENTRICLE PLAX 2D LVIDd:         4.70 cm   Diastology LVIDs:         2.40 cm   LV e' lateral:   8.08 cm/s LV PW:         1.20 cm   LV E/e' lateral: 13.9 LV IVS:        1.10 cm LVOT diam:     2.20 cm LV SV:         103 LV SV Index:   50 LVOT Area:     3.80 cm  RIGHT VENTRICLE RV S prime:     15.90 cm/s TAPSE (M-mode): 3.2 cm  LEFT ATRIUM           Index        RIGHT ATRIUM           Index LA diam:      3.90 cm 1.90 cm/m   RA Area:     22.70 cm LA Vol (A2C): 74.7 ml 36.35 ml/m  RA Volume:   71.50 ml  34.79 ml/m LA Vol (A4C): 70.2 ml 34.16 ml/m  AORTIC VALVE LVOT Vmax:   116.00 cm/s LVOT Vmean:  85.600 cm/s LVOT VTI:    0.270 m  AORTA Ao Root diam: 3.30 cm MITRAL VALVE                TRICUSPID VALVE MV Area (PHT): 3.12 cm     TR Peak grad:   28.5 mmHg MV Decel Time: 243 msec     TR Vmax:        267.00 cm/s MV E velocity: 112.00 cm/s MV A velocity: 134.00 cm/s  SHUNTS MV E/A ratio:  0.84         Systemic VTI:  0.27 m                             Systemic Diam: 2.20 cm Darryle Decent MD Electronically signed by Darryle Decent MD Signature Date/Time: 07/10/2024/12:08:00 PM    Final    MR BRAIN WO CONTRAST Result Date: 07/09/2024 EXAM: MRI BRAIN WITHOUT CONTRAST 07/09/2024 07:08:25 PM TECHNIQUE: Multiplanar multisequence MRI of the head/brain was performed without the administration of intravenous contrast. COMPARISON: CT head 07/09/2024. CLINICAL HISTORY: AMS, Confusion. Pt BIB EMS from a syncopal episode. FINDINGS: BRAIN AND VENTRICLES: No acute infarct. No intracranial hemorrhage. No mass. No midline shift. No hydrocephalus. The sella is unremarkable. Normal flow voids. ORBITS: No acute abnormality. SINUSES AND MASTOIDS: No acute abnormality. BONES AND SOFT TISSUES: Normal marrow signal. No acute soft tissue abnormality. IMPRESSION: 1. No acute intracranial abnormality. Electronically signed by: Gilmore Molt MD 07/09/2024 07:19 PM EDT RP Workstation: [id number]  DG Chest Portable 1 View Result Date: 07/09/2024 CLINICAL DATA:  Syncopal episode. EXAM: PORTABLE CHEST 1 VIEW COMPARISON:  March 13, 2022 FINDINGS: The cardiac silhouette is mildly enlarged. Low lung volumes are noted. No acute infiltrate, pleural effusion or pneumothorax is identified. Radiopaque surgical clips are seen overlying the right upper quadrant. The  visualized skeletal structures are unremarkable. IMPRESSION: No active disease. Electronically Signed   By: Suzen Dials M.D.   On: 07/09/2024 16:48   CT Head Wo Contrast Result Date: 07/09/2024 CLINICAL DATA:  Status post trauma. EXAM: CT HEAD WITHOUT CONTRAST TECHNIQUE: Contiguous axial images were obtained from the base of the skull through the vertex without intravenous contrast. RADIATION DOSE REDUCTION: This exam was performed according to the departmental dose-optimization program which includes automated exposure control, adjustment of the mA and/or kV according to patient size and/or use of iterative reconstruction technique. COMPARISON:  June 19, 2023 FINDINGS: Brain: No evidence of acute infarction, hemorrhage, hydrocephalus, extra-axial collection or mass lesion/mass effect. Vascular: No hyperdense vessel or unexpected calcification. Skull: Normal. Negative for fracture or focal lesion. Sinuses/Orbits: No acute finding. Other: None. IMPRESSION: No acute intracranial pathology. Electronically Signed   By: Suzen Dials M.D.   On: 07/09/2024 16:46    Microbiology: Results for orders placed or performed during the hospital encounter of 07/03/20  SARS CORONAVIRUS 2 ([name] 6-24 HRS) Nasopharyngeal Nasopharyngeal Swab     Status: None   Collection Time: 07/03/20  9:35 AM   Specimen: Nasopharyngeal Swab  Result Value Ref Range Status   SARS Coronavirus 2 NEGATIVE NEGATIVE Final    Comment: (NOTE) SARS-CoV-2 target nucleic acids are NOT DETECTED.  The SARS-CoV-2 RNA is generally detectable in upper and lower respiratory specimens during the acute phase of infection. Negative results do not preclude SARS-CoV-2 infection, do not rule out co-infections with other pathogens, and should not be used as the sole basis for treatment or other patient management decisions. Negative results must be combined with clinical observations, patient history, and epidemiological information. The  expected result is Negative.  Fact Sheet for Patients: hairslick.no  Fact Sheet for Healthcare Providers: quierodirigir.com  This test is not yet approved or cleared by the [place]  FDA and  has been authorized for detection and/or diagnosis of SARS-CoV-2 by FDA under an Emergency Use Authorization (EUA). This EUA will remain  in effect (meaning this test can be used) for the duration of the COVID-19 declaration under Se ction 564(b)(1) of the Act, 21 U.S.C. section 360bbb-3(b)(1), unless the authorization is terminated or revoked sooner.  Performed at [facility], [address] [place], [place] [address]     Labs: CBC: Recent Labs  Lab 07/09/24 1540 07/10/24 0440 07/11/24 0517  WBC 3.7* 2.4* 2.5*  NEUTROABS 2.2  --   --   HGB 8.4* 7.1* 8.0*  HCT 25.2* 21.7* 24.7*  MCV 101.6* 100.9* 101.2*  PLT 76* 54* 56*   Basic Metabolic Panel: Recent Labs  Lab 07/09/24 1540 07/10/24 0440 07/11/24 0517  NA 134* 138 140  K 5.2* 4.3 4.3  CL 101 106 106  CO2 20* 22 25  GLUCOSE 280* 145* 146*  BUN 26* 20 16  CREATININE 1.56* 1.17 1.08  CALCIUM  8.7* 8.7* 8.9  MG  --   --  1.9   Liver Function Tests: Recent Labs  Lab 07/09/24 1540  AST 26  ALT 19  ALKPHOS 78  BILITOT 0.6  PROT 6.0*  ALBUMIN 4.2   CBG: Recent Labs  Lab  07/10/24 1107 07/10/24 1613 07/10/24 2030 07/11/24 0743 07/11/24 1111  GLUCAP 190* 146* 144* 151* 209*    Discharge time spent:  35 minutes.  Signed: Eric Nunnery, MD [facility] 07/11/2024

## 2024-07-11 NOTE — Progress Notes (Signed)
 OT Cancellation Note  Patient Details Name: Derek Drake MRN: [id number] DOB: 05/11/1946   Cancelled Treatment:    Reason Eval/Treat Not Completed: OT screened, no needs identified, will sign off. Per physical therapy evaluation pt is at level of Mod I and does not appear to be in need of OT services. Pt will be removed from the OT list.   JAYSON PERSON OT, MOT   Jayson Person 07/11/2024, 7:33 AM

## 2024-07-11 NOTE — Plan of Care (Addendum)
 Pt is alert and oriented x 4. Steady gait. Up with assist due to syncope dx. Denies pain. Vitals stable. No prns given  Problem: Education: Goal: Knowledge of General Education information will improve Description: Including pain rating scale, medication(s)/side effects and non-pharmacologic comfort measures Outcome: Progressing   Problem: Health Behavior/Discharge Planning: Goal: Ability to manage health-related needs will improve Outcome: Progressing   Problem: Clinical Measurements: Goal: Ability to maintain clinical measurements within normal limits will improve Outcome: Progressing Goal: Will remain free from infection Outcome: Progressing Goal: Diagnostic test results will improve Outcome: Progressing Goal: Respiratory complications will improve Outcome: Progressing Goal: Cardiovascular complication will be avoided Outcome: Progressing   Problem: Activity: Goal: Risk for activity intolerance will decrease Outcome: Progressing   Problem: Nutrition: Goal: Adequate nutrition will be maintained Outcome: Progressing   Problem: Coping: Goal: Level of anxiety will decrease Outcome: Progressing   Problem: Elimination: Goal: Will not experience complications related to bowel motility Outcome: Progressing Goal: Will not experience complications related to urinary retention Outcome: Progressing   Problem: Pain Managment: Goal: General experience of comfort will improve and/or be controlled Outcome: Progressing   Problem: Safety: Goal: Ability to remain free from injury will improve Outcome: Progressing   Problem: Skin Integrity: Goal: Risk for impaired skin integrity will decrease Outcome: Progressing   Problem: Education: Goal: Ability to describe self-care measures that may prevent or decrease complications (Diabetes Survival Skills Education) will improve Outcome: Progressing Goal: Individualized Educational Video(s) Outcome: Progressing   Problem:  Coping: Goal: Ability to adjust to condition or change in health will improve Outcome: Progressing   Problem: Fluid Volume: Goal: Ability to maintain a balanced intake and output will improve Outcome: Progressing   Problem: Health Behavior/Discharge Planning: Goal: Ability to identify and utilize available resources and services will improve Outcome: Progressing Goal: Ability to manage health-related needs will improve Outcome: Progressing   Problem: Metabolic: Goal: Ability to maintain appropriate glucose levels will improve Outcome: Progressing   Problem: Nutritional: Goal: Maintenance of adequate nutrition will improve Outcome: Progressing Goal: Progress toward achieving an optimal weight will improve Outcome: Progressing   Problem: Skin Integrity: Goal: Risk for impaired skin integrity will decrease Outcome: Progressing   Problem: Tissue Perfusion: Goal: Adequacy of tissue perfusion will improve Outcome: Progressing

## 2024-07-11 NOTE — Progress Notes (Signed)
 Mobility Specialist Progress Note:    07/11/24 0910  Mobility  Activity Ambulated with assistance  Level of Assistance Independent  Assistive Device None  Distance Ambulated (ft) 540 ft  Range of Motion/Exercises Active;All extremities  Activity Response Tolerated well  Mobility Referral Yes  Mobility visit 1 Mobility  Mobility Specialist Start Time (ACUTE ONLY) 0910  Mobility Specialist Stop Time (ACUTE ONLY) 0930  Mobility Specialist Time Calculation (min) (ACUTE ONLY) 20 min   Pt received in bed, wife in room. Agreeable to mobility, independently able to stand and ambulate with no AD. Tolerated well, denied lightheadedness and dizziness. Returned supine, all needs met.   Aivan Fillingim Mobility Specialist Please contact via Special Educational Needs Teacher or  Rehab office at [phone]

## 2024-07-12 ENCOUNTER — Inpatient Hospital Stay

## 2024-07-12 VITALS — BP 145/71 | HR 70 | Temp 96.3°F | Resp 20

## 2024-07-12 DIAGNOSIS — D469 Myelodysplastic syndrome, unspecified: Secondary | ICD-10-CM

## 2024-07-12 DIAGNOSIS — D500 Iron deficiency anemia secondary to blood loss (chronic): Secondary | ICD-10-CM | POA: Diagnosis not present

## 2024-07-12 LAB — CBC WITH DIFFERENTIAL/PLATELET
Abs Immature Granulocytes: 0.03 K/uL (ref 0.00–0.07)
Basophils Absolute: 0 K/uL (ref 0.0–0.1)
Basophils Relative: 0 %
Eosinophils Absolute: 0.1 K/uL (ref 0.0–0.5)
Eosinophils Relative: 2 %
HCT: 27.1 % — ABNORMAL LOW (ref 39.0–52.0)
Hemoglobin: 8.9 g/dL — ABNORMAL LOW (ref 13.0–17.0)
Immature Granulocytes: 1 %
Lymphocytes Relative: 17 %
Lymphs Abs: 0.6 K/uL — ABNORMAL LOW (ref 0.7–4.0)
MCH: 33.5 pg (ref 26.0–34.0)
MCHC: 32.8 g/dL (ref 30.0–36.0)
MCV: 101.9 fL — ABNORMAL HIGH (ref 80.0–100.0)
Monocytes Absolute: 0.8 K/uL (ref 0.1–1.0)
Monocytes Relative: 26 %
Neutro Abs: 1.7 K/uL (ref 1.7–7.7)
Neutrophils Relative %: 54 %
Platelets: 65 K/uL — ABNORMAL LOW (ref 150–400)
RBC: 2.66 MIL/uL — ABNORMAL LOW (ref 4.22–5.81)
RDW: 14.5 % (ref 11.5–15.5)
WBC: 3.2 K/uL — ABNORMAL LOW (ref 4.0–10.5)
nRBC: 0 % (ref 0.0–0.2)

## 2024-07-12 MED ORDER — LUSPATERCEPT-AAMT 75 MG ~~LOC~~ SOLR
1.0000 mg/kg | Freq: Once | SUBCUTANEOUS | Status: AC
  Administered 2024-07-12: 80 mg via SUBCUTANEOUS
  Filled 2024-07-12 (×2): qty 1.6

## 2024-07-12 NOTE — Progress Notes (Signed)
Patient presents today for Reblozyl injection. Hemoglobin reviewed prior to administration. VSS tolerated without incident or complaint. See MAR for details. Patient stable during and after injection. Patient discharged in satisfactory condition with no s/s of distress noted.

## 2024-07-12 NOTE — Patient Instructions (Signed)
 [facility] - A DEPT OF [facility]  Discharge Instructions: Thank you for choosing [facility] to provide your oncology and hematology care.  If you have a lab appointment with the Cancer Center - please note that after April 8th, 2024, all labs will be drawn in the cancer center.  You do not have to check in or register with the main entrance as you have in the past but will complete your check-in in the cancer center.  Wear comfortable clothing and clothing appropriate for easy access to any Portacath or PICC line.   We strive to give you quality time with your provider. You may need to reschedule your appointment if you arrive late (15 or more minutes).  Arriving late affects you and other patients whose appointments are after yours.  Also, if you miss three or more appointments without notifying the office, you may be dismissed from the clinic at the provider's discretion.      For prescription refill requests, have your pharmacy contact our office and allow 72 hours for refills to be completed.    Today you received the following Reblozyl . Per Delon Hope NP start taking a baby aspirin .    To help prevent nausea and vomiting after your treatment, we encourage you to take your nausea medication as directed.  BELOW ARE SYMPTOMS THAT SHOULD BE REPORTED IMMEDIATELY: *FEVER GREATER THAN 100.4 F (38 C) OR HIGHER *CHILLS OR SWEATING *NAUSEA AND VOMITING THAT IS NOT CONTROLLED WITH YOUR NAUSEA MEDICATION *UNUSUAL SHORTNESS OF BREATH *UNUSUAL BRUISING OR BLEEDING *URINARY PROBLEMS (pain or burning when urinating, or frequent urination) *BOWEL PROBLEMS (unusual diarrhea, constipation, pain near the anus) TENDERNESS IN MOUTH AND THROAT WITH OR WITHOUT PRESENCE OF ULCERS (sore throat, sores in mouth, or a toothache) UNUSUAL RASH, SWELLING OR PAIN  UNUSUAL VAGINAL DISCHARGE OR ITCHING   Items with * indicate a potential emergency and should be  followed up as soon as possible or go to the Emergency Department if any problems should occur.  Please show the CHEMOTHERAPY ALERT CARD or IMMUNOTHERAPY ALERT CARD at check-in to the Emergency Department and triage nurse.  Should you have questions after your visit or need to cancel or reschedule your appointment, please contact [facility] [place] - A DEPT OF [facility] [phone]  and follow the prompts.  Office hours are 8:00 a.m. to 4:30 p.m. Monday - Friday. Please note that voicemails left after 4:00 p.m. may not be returned until the following business day.  We are closed weekends and major holidays. You have access to a nurse at all times for urgent questions. Please call the main number to the clinic [phone] and follow the prompts.  For any non-urgent questions, you may also contact your provider using MyChart. We now offer e-Visits for anyone 69 and older to request care online for non-urgent symptoms. For details visit [email or web address].   Also download the MyChart app! Go to the app store, search MyChart, open the app, select [facility], and log in with your MyChart username and password.

## 2024-07-13 ENCOUNTER — Inpatient Hospital Stay

## 2024-07-24 ENCOUNTER — Other Ambulatory Visit: Payer: Self-pay

## 2024-07-28 ENCOUNTER — Other Ambulatory Visit: Payer: Self-pay | Admitting: Oncology

## 2024-07-28 ENCOUNTER — Ambulatory Visit: Payer: Self-pay | Admitting: Gastroenterology

## 2024-07-28 ENCOUNTER — Ambulatory Visit (HOSPITAL_COMMUNITY)
Admission: RE | Admit: 2024-07-28 | Discharge: 2024-07-28 | Disposition: A | Discharge status: Home / Self Care | Source: Ambulatory Visit | Attending: Gastroenterology | Admitting: Gastroenterology

## 2024-07-28 DIAGNOSIS — K7581 Nonalcoholic steatohepatitis (NASH): Secondary | ICD-10-CM | POA: Insufficient documentation

## 2024-07-28 DIAGNOSIS — K7469 Other cirrhosis of liver: Secondary | ICD-10-CM | POA: Diagnosis present

## 2024-07-28 DIAGNOSIS — D469 Myelodysplastic syndrome, unspecified: Secondary | ICD-10-CM

## 2024-08-02 ENCOUNTER — Other Ambulatory Visit: Payer: Self-pay | Admitting: Internal Medicine

## 2024-08-02 ENCOUNTER — Ambulatory Visit (INDEPENDENT_AMBULATORY_CARE_PROVIDER_SITE_OTHER): Admitting: Internal Medicine

## 2024-08-02 ENCOUNTER — Encounter: Payer: Self-pay | Admitting: Internal Medicine

## 2024-08-02 VITALS — BP 132/70 | HR 73 | Temp 98.0°F | Ht 71.0 in | Wt 183.4 lb

## 2024-08-02 DIAGNOSIS — Z7984 Long term (current) use of oral hypoglycemic drugs: Secondary | ICD-10-CM

## 2024-08-02 DIAGNOSIS — R55 Syncope and collapse: Secondary | ICD-10-CM

## 2024-08-02 DIAGNOSIS — E7849 Other hyperlipidemia: Secondary | ICD-10-CM

## 2024-08-02 DIAGNOSIS — K7469 Other cirrhosis of liver: Secondary | ICD-10-CM

## 2024-08-02 DIAGNOSIS — E785 Hyperlipidemia, unspecified: Secondary | ICD-10-CM | POA: Diagnosis not present

## 2024-08-02 DIAGNOSIS — I480 Paroxysmal atrial fibrillation: Secondary | ICD-10-CM | POA: Diagnosis not present

## 2024-08-02 DIAGNOSIS — D469 Myelodysplastic syndrome, unspecified: Secondary | ICD-10-CM

## 2024-08-02 DIAGNOSIS — N401 Enlarged prostate with lower urinary tract symptoms: Secondary | ICD-10-CM

## 2024-08-02 DIAGNOSIS — R0609 Other forms of dyspnea: Secondary | ICD-10-CM

## 2024-08-02 DIAGNOSIS — E1169 Type 2 diabetes mellitus with other specified complication: Secondary | ICD-10-CM | POA: Diagnosis not present

## 2024-08-02 DIAGNOSIS — D61818 Other pancytopenia: Secondary | ICD-10-CM

## 2024-08-02 DIAGNOSIS — N183 Chronic kidney disease, stage 3 unspecified: Secondary | ICD-10-CM

## 2024-08-02 DIAGNOSIS — K7581 Nonalcoholic steatohepatitis (NASH): Secondary | ICD-10-CM

## 2024-08-02 HISTORY — DX: Other forms of dyspnea: R06.09

## 2024-08-02 LAB — CBC WITH DIFFERENTIAL/PLATELET
Basophils Absolute: 0 K/uL (ref 0.0–0.1)
Basophils Relative: 0.4 % (ref 0.0–3.0)
Eosinophils Absolute: 0.1 K/uL (ref 0.0–0.7)
Eosinophils Relative: 2.9 % (ref 0.0–5.0)
HCT: 37 % — ABNORMAL LOW (ref 39.0–52.0)
Hemoglobin: 12.4 g/dL — ABNORMAL LOW (ref 13.0–17.0)
Lymphocytes Relative: 18.5 % (ref 12.0–46.0)
Lymphs Abs: 0.8 K/uL (ref 0.7–4.0)
MCHC: 33.5 g/dL (ref 30.0–36.0)
MCV: 95.6 fl (ref 78.0–100.0)
Monocytes Absolute: 0.9 K/uL (ref 0.1–1.0)
Monocytes Relative: 20.3 % — ABNORMAL HIGH (ref 3.0–12.0)
Neutro Abs: 2.5 K/uL (ref 1.4–7.7)
Neutrophils Relative %: 57.9 % (ref 43.0–77.0)
Platelets: 89 K/uL — ABNORMAL LOW (ref 150.0–400.0)
RBC: 3.87 Mil/uL — ABNORMAL LOW (ref 4.22–5.81)
RDW: 14.8 % (ref 11.5–15.5)
WBC: 4.3 K/uL (ref 4.0–10.5)

## 2024-08-02 LAB — COMPREHENSIVE METABOLIC PANEL WITH GFR
ALT: 21 U/L (ref 0–53)
AST: 28 U/L (ref 0–37)
Albumin: 4.7 g/dL (ref 3.5–5.2)
Alkaline Phosphatase: 92 U/L (ref 39–117)
BUN: 15 mg/dL (ref 6–23)
CO2: 28 meq/L (ref 19–32)
Calcium: 10 mg/dL (ref 8.4–10.5)
Chloride: 101 meq/L (ref 96–112)
Creatinine, Ser: 1.09 mg/dL (ref 0.40–1.50)
GFR: 64.88 mL/min (ref >60.00)
Glucose, Bld: 191 mg/dL — ABNORMAL HIGH (ref 70–99)
Potassium: 4.5 meq/L (ref 3.5–5.1)
Sodium: 137 meq/L (ref 135–145)
Total Bilirubin: 0.7 mg/dL (ref 0.2–1.2)
Total Protein: 6.7 g/dL (ref 6.0–8.3)

## 2024-08-02 LAB — LIPID PANEL
Cholesterol: 115 mg/dL (ref 0–200)
HDL: 35.5 mg/dL — ABNORMAL LOW (ref >39.00)
LDL Cholesterol: 57 mg/dL (ref 0–99)
NonHDL: 79.12
Total CHOL/HDL Ratio: 3
Triglycerides: 113 mg/dL (ref 0.0–149.0)
VLDL: 22.6 mg/dL (ref 0.0–40.0)

## 2024-08-02 LAB — MICROALBUMIN / CREATININE URINE RATIO
Creatinine,U: 23.4 mg/dL
Microalb Creat Ratio: 58.2 mg/g — ABNORMAL HIGH (ref 0.0–30.0)
Microalb, Ur: 1.4 mg/dL (ref 0.0–1.9)

## 2024-08-02 LAB — LDL CHOLESTEROL, DIRECT: Direct LDL: 72 mg/dL

## 2024-08-02 MED ORDER — TAMSULOSIN HCL 0.4 MG PO CAPS: 0.4000 mg | ORAL_CAPSULE | Freq: Every day | ORAL | 0 refills | Status: DC

## 2024-08-02 MED ORDER — OZEMPIC (0.25 OR 0.5 MG/DOSE) 2 MG/3ML ~~LOC~~ SOPN: 0.2500 mg | PEN_INJECTOR | SUBCUTANEOUS | 5 refills | Status: DC

## 2024-08-02 MED ORDER — DEXCOM G7 SENSOR MISC: 1.0000 | Freq: Every day | 1 refills | Status: DC

## 2024-08-02 NOTE — Assessment & Plan Note (Signed)
 Agree with cardiology assessment from hospital; most likely due to diltiazem  adverse event/side effect(s).  He is all better and should not recur after discontinuation diltiazem .

## 2024-08-02 NOTE — Patient Instructions (Addendum)
 [email or web address]  VISIT SUMMARY: Today, we discussed your recent episode of fainting and difficulty breathing, which was likely caused by your blood pressure medication. We also reviewed your diabetes management, heart condition, blood counts, prostate health, liver condition, kidney function, and cholesterol levels. Adjustments were made to your medications to improve your overall health and reduce risks.  YOUR PLAN: -PAROXYSMAL ATRIAL FIBRILLATION: Paroxysmal atrial fibrillation is a type of irregular heartbeat that comes and goes. Your recent fainting episode was likely due to your blood pressure medication causing a slow heart rate. We discussed the potential need for a pacemaker or loop recorder, and you should follow up with your cardiologist on Friday. Aspirin  has been discontinued due to your low platelet count and bleeding risk.  -TYPE 2 DIABETES MELLITUS WITH HYPOGLYCEMIA RISK: Type 2 diabetes is a condition where your body does not use insulin  properly, leading to high blood sugar levels. Your recent low blood sugar episodes are likely due to glimepiride, which has been discontinued. We adjusted your metformin dose and started continuous glucose monitoring with Dexcom G7 to better manage your blood sugar levels.  -MYELODYSPLASTIC SYNDROME WITH PANCYTOPENIA: Myelodysplastic syndrome is a condition where your bone marrow does not produce enough healthy blood cells. This leads to low blood counts, including platelets. Aspirin  therapy has been deferred due to the risk of bleeding, and we will continue to coordinate with your hematology team for management.  -BENIGN PROSTATIC HYPERPLASIA WITH LOWER URINARY TRACT SYMPTOMS: Benign prostatic hyperplasia is an enlarged prostate that can cause urinary symptoms. Tamsulosin was not effective for you, so we discussed discontinuing it if it does not help with nighttime urination.  -CHRONIC LIVER DISEASE: Chronic  liver disease is a long-term condition affecting your liver. Your recent liver scan shows that your condition is stable. We will continue to follow up with your liver specialist as scheduled.  -CHRONIC KIDNEY DISEASE, UNSPECIFIED: Chronic kidney disease is a condition where your kidneys do not function properly. We discussed adjusting your medications to avoid further kidney damage and will continue to monitor your kidney function.  -OTHER HYPERLIPIDEMIA: Hyperlipidemia is having high levels of fats in your blood. You have been taking Zocor  for this condition. We ordered a cholesterol profile to assess your current lipid levels and will reassess the need for Zocor  based on the results.  INSTRUCTIONS: Please follow up with your cardiologist on Friday to discuss the potential need for a pacemaker or loop recorder. Continue using the Dexcom G7 for continuous glucose monitoring and adjust your diet and medications based on the readings. Discontinue tamsulosin if it is not effective in reducing nighttime urination. Follow up with your liver specialist and hematology team as scheduled. We will re view your cholesterol profile results and adjust your medications as needed.  Welcome aboard!   Today's visit was a valuable first step in understanding your health and starting your personalized care journey. We discussed your medical history and medications in detail. Given the extensive information, we prioritized addressing your most pressing concerns.  We understood those concerns to be:  New Pt (Pt is present to est care with pcp)   Building a Complete Picture  To create the most effective care plan possible, we may need additional information from previous providers. We encouraged you to gather any relevant medical records for your next visit. This will help us  build a more complete picture and develop a personalized plan together. In the meantime, we'll address your immediate concerns and provide  resources to help you  manage all of your medical issues.  We encourage you to use MyChart to review these efforts, and to help us  find and correct any omissions or errors in your medical chart.  Managing Your Health Over Time  Managing every aspect of your health in a single visit isn't always feasible, but that's okay.  We addressed your most pressing concerns today and charted a course for future care. Acute conditions or preventive care measures may require further attention.  We encourage you to schedule a follow-up visit at your earliest convenience to discuss any unresolved issues.  We strongly encourage participation in annual preventive care visits to help us  develop a more thorough understanding of your health and to help you maintain optimal wellness - please inquire about scheduling your next one with us  at your earliest convenience.  Your Satisfaction Matters  It was a pleasure seeing you today!  Your health and satisfaction will always be my top priorities. If you believe your experience today was worthy of a 5-star rating, I'd be grateful for your feedback!  Bernardino KANDICE Cone, MD   Next Steps  Schedule Follow-Up:  We recommend a follow-up appointment in Return in about 2 months (around 10/02/2024). If your condition worsens before then, please call us  or seek emergency care. Preventive Care:  Don't forget to schedule your annual preventive care visit!  This important checkup is typically covered by insurance and helps identify potential health issues early.  Typically its 100% insurance covered with no co-pay and helps to get surveillance labwork paid for through your insurance provider.  Sometimes it even lowers your insurance premiums to participate. Medical Information Release:  For any relevant medical information we don't have, please sign a release form so we can obtain it for your records. Lab & X-ray Appointments:  Scheduled any incomplete lab tests today or call us  to schedule.   X-Rays can be done without an appointment at [facility] at [facility] ([address]), M-F 8:30am-noon or 1pm-5pm.  Just tell them you're there for X-rays ordered by Dr. Cone.  We'll receive the results and contact you by phone or MyChart to discuss next steps.  Bring to Your Next Appointment  Medications: Please bring all your medication bottles to your next appointment to ensure we have an accurate record of your prescriptions. Health Diaries: If you're monitoring any health conditions at home, keeping a diary of your readings can be very helpful for discussions at your next appointment.  Reviewing Your Records  Please Review this early draft of your clinical notes below and the final encounter summary tomorrow on MyChart after its been completed.   Syncope and collapse -     CBC with Differential/Platelet -     Comprehensive metabolic panel with GFR -     Dexcom G7 Sensor; 1 Act by Does not apply route daily.  Dispense: 9 each; Refill: 1 -     Lipid panel  Dyspnea on exertion -     Dexcom G7 Sensor; 1 Act by Does not apply route daily.  Dispense: 9 each; Refill: 1  Long term current use of oral hypoglycemic drug -     Dexcom G7 Sensor; 1 Act by Does not apply route daily.  Dispense: 9 each; Refill: 1  PAF (paroxysmal atrial fibrillation) (HCC) -     Lipid panel  Type 2 diabetes mellitus with hyperlipidemia (HCC) -     Ozempic (0.25 or 0.5 MG/DOSE); Inject 0.25 mg into the skin once  a week. Stop glimepiride if taking.  Dispense: 3 mL; Refill: 5 -     LDL cholesterol, direct  Other pancytopenia (HCC)  MDS (myelodysplastic syndrome) (HCC)  BPH with lower urinary tract symptoms without urinary obstruction -     Tamsulosin HCl; Take 1 capsule (0.4 mg total) by mouth at bedtime. If doesn't reduce waking up to urinate, stop taking.  Dispense: 60 capsule; Refill: 0  Other hyperlipidemia -     Lipid panel     Getting Answers and Following Up  Simple Questions  & Concerns: For quick questions or basic follow-up after your visit, reach us  at [phone] or MyChart messaging. Complex Concerns: If your concern is more complex, scheduling an appointment might be best. Discuss this with the staff to find the most suitable option. Lab & Imaging Results: We'll contact you directly if results are abnormal or you don't use MyChart. Most normal results will be on MyChart within 2-3 business days, with a review message from Dr. Jesus. Haven't heard back in 2 weeks? Need results sooner? Contact us  at [phone]. Referrals: Our referral coordinator will manage specialist referrals. The specialist's office should contact you within 2 weeks to schedule an appointment. Call us  if you haven't heard from them after 2 weeks.  Staying Connected  MyChart: Activate your MyChart for the fastest way to access results and message us . See the last page of this paperwork for instructions.  Billing  X-ray & Lab Orders: These are billed by separate companies. Contact the invoicing company directly for questions or concerns. Visit Charges: Discuss any billing inquiries with our administrative services team.  Feedback & Satisfaction  Share Your Experience: We strive for your satisfaction! If you have any complaints, please let Dr. Jesus know directly or contact our Practice Administrators, Manuelita Rubin or [employer], by asking at the front desk.  Scheduling Tips  Shorter Wait Times: 8 am and 1 pm appointments often have the quickest wait times. Longer Appointments: If you need more time during your visit, talk to the front desk. Due to insurance regulations, multiple back-to-back appointments might be necessary.

## 2024-08-02 NOTE — Assessment & Plan Note (Addendum)
 Recent syncope is likely due to diltiazem -induced bradycardia. He occasionally experiences palpitations, but he always knows when it happens and it is a benign condition known as PVC (premature ventricular contraction). A potential need for a pacemaker or loop recorder was discussed. Aspirin  therapy was considered but deferred due to low platelet count and bleeding risk. Continue follow-up with the cardiologist on Friday. Consider a loop recorder or pacemaker if indicated by the cardiologist. Aspirin  has been discontinued due to low platelet count and bleeding risk, and recent low hemoglobin and history recurrent arteriovenous malformation(s) (AVM) bleeding

## 2024-08-02 NOTE — Assessment & Plan Note (Signed)
 Recent hypoglycemia is likely due to glimepiride. A1c is 4.8, indicating tight glycemic control. The metformin dose was adjusted to reduce hypoglycemia risk. Continuous glucose monitoring is recommended to assess glucose levels and guide medication adjustments. Glimepiride has been discontinued. Continue metformin at the current dose. Initiated continuous glucose monitoring with Dexcom G7. Reassess glucose levels and medication regimen based on monitoring results.

## 2024-08-02 NOTE — Assessment & Plan Note (Signed)
 A recent liver scan shows a stable condition. Coordination with the liver specialist is ongoing. Continue follow-up with the liver specialist as scheduled.

## 2024-08-02 NOTE — Assessment & Plan Note (Signed)
 This chronic condition presents with a low platelet count. Aspirin  therapy is deferred due to bleeding risk. Coordination with the hematology team is ongoing. Continue coordination with the hematology team for management.

## 2024-08-02 NOTE — Progress Notes (Signed)
 [facility]  Phone: [phone]  - Medical Office Visit -  Visit Date: 08/02/2024 Patient: Derek Drake   DOB: 04/04/1946   78 y.o. Male  MRN: [id number] Patient Care Team: Jesus Bernardino MATSU, MD as PCP - General (Internal Medicine) Debera Jayson MATSU, MD as PCP - Cardiology (Cardiology) Debera Jayson MATSU, MD as Consulting Physician (Cardiology) Shaaron Lamar HERO, MD as Consulting Physician (Gastroenterology) Today's Health Care Provider: Bernardino MATSU Jesus, MD  ===========================================   Chief Complaint / Reason for Visit: New Pt (Pt is present to est care with pcp) No special concern(s) but did have syncope hospital associated with shortness of breath last month, all better now.  Background: 78 y.o. male who has PAF (paroxysmal atrial fibrillation) (HCC); Thrombocytopenia; Type 2 diabetes mellitus with hyperlipidemia (HCC); Essential hypertension, benign; Esophageal varices (HCC); Iron deficiency anemia due to chronic blood loss; Liver cirrhosis secondary to NASH (HCC); Gastroesophageal reflux disease; Acute on chronic blood loss anemia; Mixed hyperlipidemia; Other pancytopenia (HCC); MDS (myelodysplastic syndrome) (HCC); Syncope and collapse; Dyspnea on exertion; Long term current use of oral hypoglycemic drug; Hepatitis; and Splenomegaly on their problem list.  Discussed the use of AI scribe software for clinical note transcription with the patient, who gave verbal consent to proceed. History of Present Illness 78 year old male with hypertension and diabetes who presents with a recent episode of syncope and dyspnea. He is accompanied by his wife.  Approximately three weeks ago, he experienced a syncopal episode characterized by difficulty breathing before losing consciousness. His wife performed chest compressions until emergency services arrived. He reports that at the hospital, he was told his blood pressure medication might have contributed to the  episode. Since adjusting his medication, he feels significantly better and has not experienced further episodes. He monitors his blood pressure regularly and notes it has been stable since the medication adjustment.  He has a history of diabetes and is currently taking metformin and glimepiride. He experiences episodes of hypoglycemia, particularly between meals, and has adjusted his medication regimen independently in the past. His recent A1c was noted to be very low.  He has a history of paroxysmal atrial fibrillation and has experienced occasional palpitations. He is not currently on blood thinners due to a history of gastrointestinal bleeding, which occurred approximately two to three years ago. He is taking aspirin , although there is some concern about his low platelet count.  He has a history of liver inflammation and myelodysplastic syndrome, which has been monitored by a hematologist. He takes iron supplements twice daily to manage his blood counts.  He experiences occasional shortness of breath on exertion, which has improved since the adjustment of his blood pressure medication. 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  He has a history of alcohol use but states he has not consumed alcohol in a significant amount for many years.   Problem overviews updated today: Problem  Dyspnea On Exertion  Long Term Current Use of Oral Hypoglycemic Drug  Mds (Myelodysplastic Syndrome) (Hcc)   His myeloid NGS is positive for ASXL1 and SRSF2 pathogenic variants. 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   [employer] IRON 325 (65 Fe) MG tablet Take 325 mg by mouth 2 (two) times daily.   furosemide (LASIX) 20 MG tablet Take 1 tablet (20 mg total) by mouth daily as needed.   gabapentin  (NEURONTIN ) 100 MG capsule Take 1 capsule (100 mg total) by mouth at bedtime.   glimepiride (AMARYL) 4 MG tablet Take 4 mg by mouth daily.   Krill Oil 500 MG CAPS Take 500 mg by mouth daily.   losartan (COZAAR) 100 MG tablet Take 100 mg by mouth daily.   metFORMIN (GLUCOPHAGE) 500 MG tablet Take 1-2 tablets (500-1,000 mg total) by mouth 2 (two) times daily. Taking 2 at night and 1 in the morning   milk thistle 175 MG tablet Take 175 mg by mouth daily.   Multiple Vitamin (MULTIVITAMIN WITH MINERALS) TABS tablet Take 1 tablet by mouth daily.   nadolol  (CORGARD ) 20 MG tablet Take 1 tablet (20 mg total) by mouth daily.   simvastatin  (ZOCOR ) 40 MG tablet Take 40 mg by mouth every evening.    pantoprazole  (PROTONIX ) 40 MG tablet TAKE 1 TABLET BY MOUTH EVERY DAY BEFORE BREAKFAST (Patient taking differently: Take 40 mg by mouth daily before breakfast.)   No current facility-administered medications on file prior to visit.   Medications Discontinued During This Encounter  Medication Reason   tamsulosin (FLOMAX) 0.4 MG CAPS capsule Reorder   Current Meds  Medication Sig   cholecalciferol (VITAMIN D3) 25 MCG (1000 UNIT) tablet Take 1,000 Units by mouth daily.   Continuous Glucose Sensor (DEXCOM G7 SENSOR) MISC 1 Act by Does not apply route daily.   [employer] IRON 325 (65 Fe) MG tablet Take 325 mg by mouth 2 (two) times daily.   furosemide (LASIX) 20 MG tablet Take 1 tablet (20 mg total) by mouth daily as needed.   gabapentin  (NEURONTIN ) 100 MG capsule Take 1 capsule (100 mg total) by mouth at bedtime.   glimepiride (AMARYL) 4 MG tablet Take 4 mg by mouth  daily.   Krill Oil 500 MG CAPS Take 500 mg by mouth daily.   losartan (COZAAR) 100 MG tablet Take 100 mg by mouth daily.   metFORMIN (GLUCOPHAGE) 500 MG tablet Take 1-2 tablets (500-1,000 mg total) by mouth 2 (two) times daily. Taking 2 at night and 1 in the morning   milk thistle 175 MG tablet Take 175 mg by mouth daily.   Multiple Vitamin (MULTIVITAMIN WITH MINERALS) TABS tablet Take 1 tablet by mouth daily.   nadolol  (CORGARD ) 20 MG tablet Take 1 tablet (20 mg total) by mouth daily.   Semaglutide,0.25 or 0.5MG /DOS, (OZEMPIC, 0.25 OR 0.5 MG/DOSE,) 2 MG/3ML SOPN Inject 0.25 mg into the skin once a week. Stop glimepiride if taking.   simvastatin  (ZOCOR ) 40 MG tablet Take 40 mg by mouth every evening.    [DISCONTINUED] tamsulosin (FLOMAX) 0.4 MG CAPS capsule Take 1 capsule (0.4 mg total) by mouth daily.    Allergies:  Rabbit protein, Hydrocodone-acetaminophen , Other, and Ivp dye [iodinated contrast media] Past Medical History:  has a past medical history of Allergy, Anemia, Blood transfusion without reported diagnosis, Cirrhosis (HCC), Essential hypertension, Gastrointestinal hemorrhage, GERD (gastroesophageal reflux disease), Heme + stool (03/22/2020), History of alcohol abuse (12/07/2015), Hypercholesteremia, PAF (paroxysmal atrial fibrillation) (HCC) (05/04/2014), and Type 2 diabetes mellitus (HCC). Past Surgical History:   has  a past surgical history that includes Cholecystectomy; Colonoscopy (N/A, 05/13/2013); Esophagogastroduodenoscopy (N/A, 02/05/2017); Colonoscopy (N/A, 02/05/2017); biopsy (02/05/2017); polypectomy (02/05/2017); Esophagogastroduodenoscopy (N/A, 07/04/2020); Givens capsule study (N/A, 07/04/2020); Esophagogastroduodenoscopy (egd) with propofol  (N/A, 01/23/2022); Colonoscopy with propofol  (N/A, 03/26/2022); Hot hemostasis (03/26/2022); Colonoscopy with propofol  (N/A, 04/14/2022); Hot hemostasis (04/14/2022); Givens capsule study (N/A, 04/12/2022); and IR BONE MARROW BIOPSY & ASPIRATION  (04/18/2024). Social History:   reports that he quit smoking about 15 years ago. His smoking use included cigarettes. He started smoking about 68 years ago. He has a 106 pack-year smoking history. His smokeless tobacco use includes chew. He reports that he does not currently use alcohol. He reports that he does not use drugs. Family History:  family history includes Congestive Heart Failure in his mother; Heart failure in his brother; Prostate cancer in his brother. Depression Screen and Health Maintenance:    08/02/2024    8:57 AM 06/29/2024   10:35 AM 05/04/2024   12:55 PM 04/05/2024   10:52 AM  PHQ 2/9 Scores  PHQ - 2 Score 0 0 0 0   Health Maintenance  Topic Date Due   FOOT EXAM  Never done   OPHTHALMOLOGY EXAM  Never done   Diabetic kidney evaluation - Urine ACR  Never done   Medicare Annual Wellness (AWV)  01/10/2020   COVID-19 Vaccine (3 - 2025-26 season) 08/18/2024 (Originally 05/16/2024)   HEMOGLOBIN A1C  01/08/2025   Diabetic kidney evaluation - eGFR measurement  07/11/2025   DTaP/Tdap/Td (2 - Td or Tdap) 07/17/2029   Pneumococcal Vaccine: 50+ Years  Completed   Influenza Vaccine  Completed   Hepatitis C Screening  Completed   Zoster Vaccines- Shingrix  Completed   Meningococcal B Vaccine  Aged Out   Colonoscopy  Discontinued   Immunization History  Administered Date(s) Administered   Fluad Quad(high Dose 65+) 05/21/2022   Fluad Trivalent(High Dose 65+) 05/25/2017, 06/07/2018   Fluzone Influenza virus vaccine,trivalent (IIV3), split virus 05/28/2012, 05/27/2013, 06/02/2014   INFLUENZA, HIGH DOSE SEASONAL PF 05/28/2015, 06/12/2016, 05/18/2019, 05/19/2023, 05/17/2024   Janssen (J&J) SARS-COV-2 Vaccination 01/20/2020   Moderna Sars-Covid-2 Vaccination 08/04/2020   PNEUMOCOCCAL CONJUGATE-20 05/17/2024   Pneumococcal-Unspecified 07/16/2012   Tdap 07/18/2019   Zoster Recombinant(Shingrix) 03/04/2018, 07/15/2018     Objective   Physical ExamBP 132/70   Pulse 73   Temp 98  F (36.7 C) (Temporal)   Ht 5' 11 (1.803 m)   Wt 183 lb 6.4 oz (83.2 kg)   SpO2 98%   BMI 25.58 kg/m  Wt Readings from Last 10 Encounters:  08/02/24 183 lb 6.4 oz (83.2 kg)  07/09/24 188 lb 4.4 oz (85.4 kg)  07/08/24 180 lb (81.6 kg)  06/29/24 180 lb (81.6 kg)  06/21/24 186 lb 3.2 oz (84.5 kg)  05/04/24 185 lb 3 oz (84 kg)  04/18/24 182 lb (82.6 kg)  04/05/24 183 lb 3.2 oz (83.1 kg)  03/31/24 183 lb 10.3 oz (83.3 kg)  02/05/24 185 lb 14.4 oz (84.3 kg)  Vital signs reviewed.  Nursing notes reviewed. Weight trend reviewed. General Appearance:  Well developed, well nourished, well-groomed, healthy-appearing male with Body mass index is 25.58 kg/m. No acute distress appreciable.   Skin: Clear and well-hydrated. Pulmonary:  Normal work of breathing at rest, no respiratory distress apparent. SpO2: 98 %  Musculoskeletal: He demonstrates smooth and coordinated movements throughout all major joints.All extremities are intact.  Neurological:  Awake, alert, oriented, and engaged.  No obvious focal neurological deficits or cognitive impairments.  Sensorium seems unclouded.  Psychiatric:  Appropriate mood, pleasant and cooperative demeanor, cheerful and engaged during the exam  Present with his wife   Reviewed Results & Data Results LABS PLT: 65 x 10^9/L (06/2024) HbA1c: 4.8% (06/2024)   No results found for any visits on 08/02/24.  Appointment on 07/12/2024  Component Date Value   WBC 07/12/2024 3.2 (L)    RBC 07/12/2024 2.66 (L)    Hemoglobin 07/12/2024 8.9 (L)    HCT 07/12/2024 27.1 (L)    MCV 07/12/2024 101.9 (H)    MCH 07/12/2024 33.5    MCHC 07/12/2024 32.8    RDW 07/12/2024 14.5    Platelets 07/12/2024 65 (L)    nRBC 07/12/2024 0.0    Neutrophils Relative % 07/12/2024 54    Neutro Abs 07/12/2024 1.7    Lymphocytes Relative 07/12/2024 17    Lymphs Abs 07/12/2024 0.6 (L)    Monocytes Relative 07/12/2024 26    Monocytes Absolute 07/12/2024 0.8    Eosinophils Relative  07/12/2024 2    Eosinophils Absolute 07/12/2024 0.1    Basophils Relative 07/12/2024 0    Basophils Absolute 07/12/2024 0.0    Immature Granulocytes 07/12/2024 1    Abs Immature Granulocytes 07/12/2024 0.03    Large Granular Lymphocyt* 07/12/2024 PRESENT    Reactive, Benign Lymphoc* 07/12/2024 PRESENT    Basophilic Stippling 07/12/2024 PRESENT    Ovalocytes 07/12/2024 PRESENT   Admission on 07/09/2024, Discharged on 07/11/2024  Component Date Value   WBC 07/09/2024 3.7 (L)    RBC 07/09/2024 2.48 (L)    Hemoglobin 07/09/2024 8.4 (L)    HCT 07/09/2024 25.2 (L)    MCV 07/09/2024 101.6 (H)    MCH 07/09/2024 33.9    MCHC 07/09/2024 33.3    RDW 07/09/2024 14.8    Platelets 07/09/2024 76 (L)    nRBC 07/09/2024 0.0    Neutrophils Relative % 07/09/2024 58    Neutro Abs 07/09/2024 2.2    Lymphocytes Relative 07/09/2024 17    Lymphs Abs 07/09/2024 0.6 (L)    Monocytes Relative 07/09/2024 21    Monocytes Absolute 07/09/2024 0.8    Eosinophils Relative 07/09/2024 2    Eosinophils Absolute 07/09/2024 0.1    Basophils Relative 07/09/2024 0    Basophils Absolute 07/09/2024 0.0    WBC Morphology 07/09/2024 MORPHOLOGY UNREMARKABLE    Smear Review 07/09/2024 See Note    Immature Granulocytes 07/09/2024 2    Abs Immature Granulocytes 07/09/2024 0.06    Ovalocytes 07/09/2024 PRESENT    Sodium 07/09/2024 134 (L)    Potassium 07/09/2024 5.2 (H)    Chloride 07/09/2024 101    CO2 07/09/2024 20 (L)    Glucose, Bld 07/09/2024 280 (H)    BUN 07/09/2024 26 (H)    Creatinine, Ser 07/09/2024 1.56 (H)    Calcium  07/09/2024 8.7 (L)    Total Protein 07/09/2024 6.0 (L)    Albumin 07/09/2024 4.2    AST 07/09/2024 26    ALT 07/09/2024 19    Alkaline Phosphatase 07/09/2024 78    Total Bilirubin 07/09/2024 0.6    GFR, Estimated 07/09/2024 45 (L)    Anion gap 07/09/2024 13    Color, Urine 07/09/2024 YELLOW    APPearance 07/09/2024 CLEAR    Specific Gravity, Urine 07/09/2024 1.013    pH 07/09/2024 6.0     Glucose, UA 07/09/2024 150 (A)    Hgb urine dipstick 07/09/2024 NEGATIVE    Bilirubin Urine 07/09/2024 NEGATIVE    Ketones, ur 07/09/2024 NEGATIVE    Protein, ur 07/09/2024 100 (A)  Nitrite 07/09/2024 NEGATIVE    Leukocytes,Ua 07/09/2024 NEGATIVE    RBC / HPF 07/09/2024 0-5    WBC, UA 07/09/2024 0-5    Bacteria, UA 07/09/2024 NONE SEEN    Squamous Epithelial / HPF 07/09/2024 0-5    ABO/RH(D) 07/09/2024 O POS    Antibody Screen 07/09/2024 NEG    Sample Expiration 07/09/2024                     Value:07/12/2024,2359 Performed at [facility], [address], [place], [place] [address]    Troponin T High Sensitiv* 07/09/2024 <15    TSH 07/09/2024 3.870    D-Dimer, Quant 07/09/2024 0.55 (H)    Lactic Acid, Venous 07/09/2024 1.4    Lactic Acid, Venous 07/09/2024 1.3    Troponin T High Sensitiv* 07/09/2024 <15    Cortisol, Plasma 07/09/2024 25.1    Weight 07/10/2024 3,012.37    Height 07/10/2024 71    BP 07/10/2024 141/68    Area-P 1/2 07/10/2024 3.12    S' Lateral 07/10/2024 2.40    Est EF 07/10/2024 60 - 65%    Sodium 07/10/2024 138    Potassium 07/10/2024 4.3    Chloride 07/10/2024 106    CO2 07/10/2024 22    Glucose, Bld 07/10/2024 145 (H)    BUN 07/10/2024 20    Creatinine, Ser 07/10/2024 1.17    Calcium  07/10/2024 8.7 (L)    GFR, Estimated 07/10/2024 >60    Anion gap 07/10/2024 10    WBC 07/10/2024 2.4 (L)    RBC 07/10/2024 2.15 (L)    Hemoglobin 07/10/2024 7.1 (L)    HCT 07/10/2024 21.7 (L)    MCV 07/10/2024 100.9 (H)    MCH 07/10/2024 33.0    MCHC 07/10/2024 32.7    RDW 07/10/2024 14.6    Platelets 07/10/2024 54 (L)    nRBC 07/10/2024 0.0    Hgb A1c MFr Bld 07/10/2024 4.8    Mean Plasma Glucose 07/10/2024 91.06    Glucose-Capillary 07/09/2024 204 (H)    Glucose-Capillary 07/10/2024 130 (H)    Glucose-Capillary 07/10/2024 190 (H)    Glucose-Capillary 07/10/2024 146 (H)    Sodium 07/11/2024 140    Potassium 07/11/2024 4.3    Chloride 07/11/2024 106     CO2 07/11/2024 25    Glucose, Bld 07/11/2024 146 (H)    BUN 07/11/2024 16    Creatinine, Ser 07/11/2024 1.08    Calcium  07/11/2024 8.9    GFR, Estimated 07/11/2024 >60    Anion gap 07/11/2024 10    WBC 07/11/2024 2.5 (L)    RBC 07/11/2024 2.44 (L)    Hemoglobin 07/11/2024 8.0 (L)    HCT 07/11/2024 24.7 (L)    MCV 07/11/2024 101.2 (H)    MCH 07/11/2024 32.8    MCHC 07/11/2024 32.4    RDW 07/11/2024 14.6    Platelets 07/11/2024 56 (L)    nRBC 07/11/2024 0.0    Magnesium 07/11/2024 1.9    Glucose-Capillary 07/10/2024 144 (H)    Comment 1 07/10/2024 Notify RN    Comment 2 07/10/2024 Document in Chart    Glucose-Capillary 07/11/2024 151 (H)    Comment 1 07/11/2024 Document in Chart    Glucose-Capillary 07/11/2024 209 (H)    Comment 1 07/11/2024 Notify RN    Comment 2 07/11/2024 Document in Chart   Appointment on 06/29/2024  Component Date Value   WBC 06/29/2024 3.3 (L)    RBC 06/29/2024 2.66 (L)    Hemoglobin 06/29/2024 8.9 (L)  HCT 06/29/2024 26.9 (L)    MCV 06/29/2024 101.1 (H)    MCH 06/29/2024 33.5    MCHC 06/29/2024 33.1    RDW 06/29/2024 14.7    Platelets 06/29/2024 70 (L)    nRBC 06/29/2024 0.0    Neutrophils Relative % 06/29/2024 56    Neutro Abs 06/29/2024 1.8    Lymphocytes Relative 06/29/2024 17    Lymphs Abs 06/29/2024 0.5 (L)    Monocytes Relative 06/29/2024 24    Monocytes Absolute 06/29/2024 0.8    Eosinophils Relative 06/29/2024 2    Eosinophils Absolute 06/29/2024 0.1    Basophils Relative 06/29/2024 0    Basophils Absolute 06/29/2024 0.0    WBC Morphology 06/29/2024 MORPHOLOGY UNREMARKABLE    RBC Morphology 06/29/2024 MORPHOLOGY UNREMARKABLE    Immature Granulocytes 06/29/2024 1    Abs Immature Granulocytes 06/29/2024 0.04    Sodium 06/29/2024 136    Potassium 06/29/2024 5.1    Chloride 06/29/2024 102    CO2 06/29/2024 25    Glucose, Bld 06/29/2024 200 (H)    BUN 06/29/2024 19    Creatinine, Ser 06/29/2024 1.33 (H)    Calcium  06/29/2024 9.6     Total Protein 06/29/2024 6.8    Albumin 06/29/2024 4.6    AST 06/29/2024 29    ALT 06/29/2024 21    Alkaline Phosphatase 06/29/2024 86    Total Bilirubin 06/29/2024 0.5    GFR, Estimated 06/29/2024 55 (L)    Anion gap 06/29/2024 10    Ferritin 06/29/2024 45    Iron 06/29/2024 171    TIBC 06/29/2024 431    Saturation Ratios 06/29/2024 40 (H)    UIBC 06/29/2024 260    LDH 06/29/2024 198 (H)    Erythropoietin 06/29/2024 62.2 (H)   Orders Only on 06/21/2024  Component Date Value   WBC 06/21/2024 3.2 (L)    RBC 06/21/2024 2.72 (LL)    Hemoglobin 06/21/2024 8.9 (L)    Hematocrit 06/21/2024 27.6 (L)    MCV 06/21/2024 102 (H)    MCH 06/21/2024 32.7    MCHC 06/21/2024 32.2    RDW 06/21/2024 13.5    Platelets 06/21/2024 79 (LL)    Neutrophils 06/21/2024 49    Lymphs 06/21/2024 23    Monocytes 06/21/2024 25    Eos 06/21/2024 3    Basos 06/21/2024 0    Neutrophils Absolute 06/21/2024 1.5    Lymphocytes Absolute 06/21/2024 0.7    Monocytes Absolute 06/21/2024 0.8    EOS (ABSOLUTE) 06/21/2024 0.1    Basophils Absolute 06/21/2024 0.0    Immature Granulocytes 06/21/2024 0    Immature Grans (Abs) 06/21/2024 0.0    Hematology Comments: 06/21/2024 Note:    Glucose 06/21/2024 90    BUN 06/21/2024 19    Creatinine, Ser 06/21/2024 1.28 (H)    eGFR 06/21/2024 57 (L)    BUN/Creatinine Ratio 06/21/2024 15    Sodium 06/21/2024 140    Potassium 06/21/2024 4.7    Chloride 06/21/2024 105    CO2 06/21/2024 22    Calcium  06/21/2024 9.7    Total Protein 06/21/2024 6.3    Albumin 06/21/2024 4.4    Globulin, Total 06/21/2024 1.9    Bilirubin Total 06/21/2024 0.5    Alkaline Phosphatase 06/21/2024 84    AST 06/21/2024 26    ALT 06/21/2024 17    INR 06/21/2024 1.0    Prothrombin Time 06/21/2024 10.9    AFP, Serum, Tumor Marker 06/21/2024 <1.8    specimen status report 06/21/2024 Comment   Appointment on 05/04/2024  Component Date Value   Ferritin 05/04/2024 76    [facility] test code  05/04/2024 [id number]    [facility] test name 05/04/2024 myeloid ngs    Misc [facility] result 05/04/2024 COMMENT    WBC 05/04/2024 2.6 (L)    RBC 05/04/2024 2.73 (L)    Hemoglobin 05/04/2024 9.1 (L)    HCT 05/04/2024 27.4 (L)    MCV 05/04/2024 100.4 (H)    MCH 05/04/2024 33.3    MCHC 05/04/2024 33.2    RDW 05/04/2024 14.5    Platelets 05/04/2024 61 (L)    nRBC 05/04/2024 0.0    Neutrophils Relative % 05/04/2024 54    Neutro Abs 05/04/2024 1.4 (L)    Lymphocytes Relative 05/04/2024 21    Lymphs Abs 05/04/2024 0.6 (L)    Monocytes Relative 05/04/2024 22    Monocytes Absolute 05/04/2024 0.6    Eosinophils Relative 05/04/2024 2    Eosinophils Absolute 05/04/2024 0.1    Basophils Relative 05/04/2024 0    Basophils Absolute 05/04/2024 0.0    WBC Morphology 05/04/2024 MORPHOLOGY UNREMARKABLE    RBC Morphology 05/04/2024 MORPHOLOGY UNREMARKABLE    Smear Review 05/04/2024 See Note    Immature Granulocytes 05/04/2024 1    Abs Immature Granulocytes 05/04/2024 0.02    Iron 05/04/2024 82    TIBC 05/04/2024 352    Saturation Ratios 05/04/2024 23    UIBC 05/04/2024 270   Hospital Outpatient Visit on 04/18/2024  Component Date Value   WBC 04/18/2024 2.5 (L)    RBC 04/18/2024 2.54 (L)    Hemoglobin 04/18/2024 8.4 (L)    HCT 04/18/2024 25.8 (L)    MCV 04/18/2024 101.6 (H)    MCH 04/18/2024 33.1    MCHC 04/18/2024 32.6    RDW 04/18/2024 15.0    Platelets 04/18/2024 60 (L)    nRBC 04/18/2024 0.0    Neutrophils Relative % 04/18/2024 57    Neutro Abs 04/18/2024 1.4 (L)    Lymphocytes Relative 04/18/2024 18    Lymphs Abs 04/18/2024 0.5 (L)    Monocytes Relative 04/18/2024 21    Monocytes Absolute 04/18/2024 0.5    Eosinophils Relative 04/18/2024 2    Eosinophils Absolute 04/18/2024 0.0    Basophils Relative 04/18/2024 0    Basophils Absolute 04/18/2024 0.0    Immature Granulocytes 04/18/2024 2    Abs Immature Granulocytes 04/18/2024 0.06    Abs Granulocyte 04/18/2024 1.4 (L)    SURGICAL  PATHOLOGY 04/18/2024                     Value:Surgical Pathology CASE: [id number] PATIENT: Derek Drake Bone Marrow Report     Clinical History: Pancytopenia     DIAGNOSIS:  BONE MARROW, ASPIRATE, CLOT, CORE: - Hypercellular bone marrow (60%) with trilineage hematopoiesis.  See comment.  PERIPHERAL BLOOD: - Pancytopenia  COMMENT: - The patient's history of pancytopenia is noted.  Morphologic evaluation reveals a hypercellular bone marrow with trilineage hematopoiesis.  While mild dyspoiesis is noted, it does not reach the threshold dysplasia.  Blasts are not increased.  Flow cytometric analysis did not identify abnormal immature population, or a clonal B or T-cell population.  A myeloid NGS panel may be requested if clinically indicated.  Correlation with pending cytogenetics is recommended for further assessment.  MICROSCOPIC DESCRIPTION:  PERIPHERAL BLOOD SMEAR: Platelets: Decreased, rare giant platelets present Erythroid: Macrocytosis Leukocytes: Leukopenia, rare hypogranular and hypolobat  ed neutrophils  BONE MARROW ASPIRATE: Cellular Erythroid precursors: Erythroid precursors show a full sequence of maturation with rare binucleate forms Granulocytic precursors: Granulocytic precursors show a full sequence of maturation with rare hypolobated/hypogranular neutrophils Megakaryocytes: Typical in number and morphology Lymphocytes/plasma cells: Not increased  TOUCH PREPARATIONS: Cellular, confirmatory of aspirate findings  CLOT AND BIOPSY: The bone marrow clot reveals hypercellular bone marrow (60%) with occasional lymphoid aggregates comprised of small mature lymphocytes.  The bone marrow biopsy reveals hypercellular bone marrow (60%) with trilineage hematopoiesis.  No significant features of dysplasia are identified in any of the lineages.  Blasts do not appear increased.  The findings are confirmatory of the aspirate and touch  prep impression.  SPECIAL STAINS: CD34: CD34 highlights vasculature and blasts, less than 5% of cellularity CD117: CD117 hig                         hlights immature mononuclear cells MPO: MPO highlights myeloid precursors E-cadherin: E-cadherin highlights erythroid precursors CD3: CD3 highlights T lymphocytes in the lymphoid aggregates CD20: CD20 highlights B lymphocytes in the lymphoid aggregates (less than CD3)  IRON STAIN: Iron stains are performed on a bone marrow aspirate or touch imprint smear and section of clot. The controls stained appropriately.       Storage Iron: Scant      Ring Sideroblasts: Not identified  ADDITIONAL DATA/TESTING: Cytogenetics   CELL COUNT DATA:  Bone Marrow count performed on 500 cells shows: Blasts:   1%   Myeloid:  43% Promyelocytes: 3%   Erythroid:     47% Myelocytes:    11%  Lymphocytes:   5% Metamyelocytes:     5%   Plasma cells:  1% Bands:    4% Neutrophils:   17%  M:E ratio:     0.91 Eosinophils:   2% Basophils:     0% Monocytes:     4%  Lab Data: CBC performed on 04/18/2024 shows: WBC: 2.5 k/uL  Neutrophils:   63%  Myelocytes:      1% Hgb: 8.4 g/dL  Lymphocytes:                            18% HCT: 25.8 %    Monocytes:     16% MCV: 101.6 fL  Eosinophils:   1% RDW: 15 % Basophils:     1% PLT: 60 k/uL    GROSS DESCRIPTION:  A. Aspirate smear.  B. Received in B-plus labeled with the patient's name and DOB is a 1.5 x 1.0 x 0.2 cm blood clot, submitted in toto in cassette B1.  C. Received in B-plus labeled with the patient's name and DOB is a 1.2 x 0.8 x 0.2 cm aggregate of tan-brown, trabeculated, firm bone, submitted in toto in cassette C1 after decalcification in Immunocal.  (LEF 04/18/2024)   Final Diagnosis performed by Ilsa Pottier, MD.   Electronically signed 04/21/2024 Technical and / or Professional components performed at [facility], [address], [place], [place] [address].   Immunohistochemistry Technical component (if applicable) was performed at [facility]. [address], [address], [place], [place] [address].   IMMUNOHISTOCHEMISTRY DISCLAIMER (if applicable): Some of these immunohistochemical                          stains may have been developed and the performance characteristics determine by [facility]. Some  may not have been cleared or approved by the U.S. Food and Drug Administration. The FDA has determined that such clearance or approval is not necessary. This test is used for clinical purposes. It should not be regarded as investigational or for research. This laboratory is certified under the Clinical Laboratory Improvement Amendments of 1988 (CLIA-88) as qualified to perform high complexity clinical laboratory testing.  The controls stained appropriately.   IHC stains are performed on formalin fixed, paraffin embedded tissue using a 3,3diaminobenzidine (DAB) chromogen and Leica Bond Autostainer System. The staining intensity of the nucleus is score manually and is reported as the percentage of tumor cell nuclei demonstrating specific nuclear staining. The specimens are fixed in 10% Neutral Formalin for at least 6 hours and up to 72hrs. These tests are validat                         ed on decalcified tissue. Results should be interpreted with caution given the possibility of false negative results on decalcified specimens. Antibody Clones are as follows ER-clone 49F, PR-clone 16, Ki67- clone MM1. Some of these immunohistochemical stains may have been developed and the performance characteristics determined by [facility].    Glucose-Capillary 04/18/2024 138 (H)    SURGICAL PATHOLOGY 04/18/2024                     Value:Surgical Pathology CASE: [id number] PATIENT: Rakan Feldstein Flow Pathology Report     Clinical history: pancytopenia     DIAGNOSIS:  - No abnormal immature population  identified - No abnormal B or T-cell population identified   GATING AND PHENOTYPIC ANALYSIS:  Gated population: Flow cytometric immunophenotyping is performed using antibodies to the antigens listed in the table below. Electronic gates are placed around a cell cluster displaying light scatter properties corresponding to: lymphocytes and blasts  Abnormal Cells in gated population: N/A  Phenotype of Abnormal Cells: N/A                      Lymphoid Antigens       Myeloid Antigens Miscellaneous CD2  tested    CD10 tested    CD11b     tested    CD45 tested CD3  tested    CD19 tested    CD11c     ND   HLA-Dr    tested CD4  tested    CD20 tested    CD13 tested    CD34 tested CD5  tested    CD22 ND   CD14 tested    CD38 tested CD7  tested    CD79b     ND   CD15 tested    CD138     ND CD8  te                         sted    CD103     ND   CD16 tested    TdT  ND CD25 ND   CD200     tested    CD33 tested    CD123     tested TCRab     ND   sKappa    tested    CD64 tested    CD41 ND TCRgd     tested    sLambda   tested    CD117     tested    CD61 ND CD56 tested    cKappa  ND   MPO  ND   CD71 ND CD57 ND   cLambda   ND             CD235a    ND        GROSS DESCRIPTION:  Reference Bone Marrow case [id number].   Final Diagnosis performed by Ilsa Pottier, MD.   Electronically signed 04/21/2024 Technical and / or Professional components performed at [facility], [address], [place], [place] [address].  The above tests were developed and their performance characteristics determined by the [facility] system for the physical and immunophenotypic characterization of cell populations. They have not been cleared by the U.S. Food and Drug administration. The  FDA has determined that such clearance or approval is not necessary. This test is used for clinical purpo                         ses. It should not be  regarded as investigational or for research    Appointment on 03/24/2024  Component Date Value   Sodium 03/24/2024 138    Potassium 03/24/2024 4.8    Chloride 03/24/2024 105    CO2 03/24/2024 21 (L)    Glucose, Bld 03/24/2024 190 (H)    BUN 03/24/2024 21    Creatinine, Ser 03/24/2024 1.32 (H)    Calcium  03/24/2024 9.4    Total Protein 03/24/2024 6.2 (L)    Albumin 03/24/2024 3.7    AST 03/24/2024 28    ALT 03/24/2024 21    Alkaline Phosphatase 03/24/2024 82    Total Bilirubin 03/24/2024 0.6    GFR, Estimated 03/24/2024 55 (L)    Anion gap 03/24/2024 12    WBC 03/24/2024 3.0 (L)    RBC 03/24/2024 2.66 (L)    Hemoglobin 03/24/2024 8.5 (L)    HCT 03/24/2024 26.4 (L)    MCV 03/24/2024 99.2    MCH 03/24/2024 32.0    MCHC 03/24/2024 32.2    RDW 03/24/2024 14.6    Platelets 03/24/2024 DCLMP    nRBC 03/24/2024 0.0    Neutrophils Relative % 03/24/2024 55    Neutro Abs 03/24/2024 1.6 (L)    Lymphocytes Relative 03/24/2024 17    Lymphs Abs 03/24/2024 0.5 (L)    Monocytes Relative 03/24/2024 25    Monocytes Absolute 03/24/2024 0.8    Eosinophils Relative 03/24/2024 2    Eosinophils Absolute 03/24/2024 0.1    Basophils Relative 03/24/2024 0    Basophils Absolute 03/24/2024 0.0    WBC Morphology 03/24/2024 MORPHOLOGY UNREMARKABLE    RBC Morphology 03/24/2024 MORPHOLOGY UNREMARKABLE    Smear Review 03/24/2024 PLATELETS APPEAR DECREASED    Immature Granulocytes 03/24/2024 1    Abs Immature Granulocytes 03/24/2024 0.04   Clinical Support on 02/05/2024  Component Date Value   SURGICAL PATHOLOGY 02/05/2024                     Value:Surgical Pathology CASE: [id number] PATIENT: Derek Drake Flow Pathology Report     Clinical history: Anemia due to bone marrow failure, unspecified bone marrow failure type     DIAGNOSIS:  - No abnormal B or T-cell population identified  GATING AND PHENOTYPIC ANALYSIS:  Gated population: Flow cytometric immunophenotyping is performed using antibodies to the antigens listed in the  table below. Electronic gates are placed around a cell cluster displaying light scatter properties corresponding to: lymphocytes  Abnormal Cells in gated population: N/A  Phenotype of Abnormal Cells: N/A  Lymphoid Antigens       Myeloid Antigens Miscellaneous CD2  tested    CD10 tested    CD11b     ND   CD45 tested CD3  tested    CD19 tested    CD11c     ND   HLA-Dr    ND CD4  tested    CD20 tested    CD13 ND   CD34 tested CD5  tested    CD22 ND   CD14 ND   CD38 tested CD7  tested    CD79b     ND   CD15 ND   CD138     ND CD8  tested    CD103     ND                            CD16 ND   TdT  ND CD25 ND   CD200     tested    CD33 ND   CD123     ND TCRab     ND   sKappa    tested    CD64 ND   CD41 ND TCRgd     tested    sLambda   tested    CD117     ND   CD61 ND CD56 tested    cKappa    ND   MPO  ND   CD71 ND CD57 ND   cLambda   ND        CD235aND       GROSS DESCRIPTION:  One lavender top tube submitted from [facility] for lymphoma testing.    Final Diagnosis performed by Ilsa Pottier, MD.   Electronically signed 02/09/2024 Technical and / or Professional components performed at [facility], [address] [place], [place] [address].  The above tests were developed and their performance characteristics determined by the [facility] system for the physical and immunophenotypic characterization of cell populations. They have not been cleared by the U.S. Food and Drug administration. The  FDA has determined that such clearance or approval is not necessary. This test is used for clinical purposes. It should not                          be  regarded as investigational or for research   Office Visit on 02/05/2024  Component Date Value   Retic Ct Pct 02/05/2024 2.3    RBC. 02/05/2024 2.89 (L)    Retic Count, Absolute 02/05/2024 67.3    Immature Retic Fract 02/05/2024 13.2    Folate 02/05/2024 31.4    Vitamin B-12 02/05/2024  1,326 (H)    Flow Cytometry 02/05/2024 SEE SEPARATE REPORT   Appointment on 01/29/2024  Component Date Value   Iron 01/29/2024 114    TIBC 01/29/2024 417    Saturation Ratios 01/29/2024 27    UIBC 01/29/2024 303    Ferritin 01/29/2024 28    Sodium 01/29/2024 135    Potassium 01/29/2024 4.6    Chloride 01/29/2024 103    CO2 01/29/2024 24    Glucose, Bld 01/29/2024 275 (H)    BUN 01/29/2024 19    Creatinine, Ser 01/29/2024 1.15    Calcium  01/29/2024 9.3    Total Protein 01/29/2024 6.4 (L)    Albumin 01/29/2024 4.0    AST 01/29/2024 28    ALT 01/29/2024 21    Alkaline Phosphatase 01/29/2024 67  Total Bilirubin 01/29/2024 0.9    GFR, Estimated 01/29/2024 >60    Anion gap 01/29/2024 8    WBC 01/29/2024 2.5 (L)    RBC 01/29/2024 2.95 (L)    Hemoglobin 01/29/2024 9.7 (L)    HCT 01/29/2024 28.0 (L)    MCV 01/29/2024 94.9    MCH 01/29/2024 32.9    MCHC 01/29/2024 34.6    RDW 01/29/2024 14.3    Platelets 01/29/2024 58 (L)    nRBC 01/29/2024 0.0    Neutrophils Relative % 01/29/2024 54    Neutro Abs 01/29/2024 1.4 (L)    Lymphocytes Relative 01/29/2024 19    Lymphs Abs 01/29/2024 0.5 (L)    Monocytes Relative 01/29/2024 24    Monocytes Absolute 01/29/2024 0.6    Eosinophils Relative 01/29/2024 2    Eosinophils Absolute 01/29/2024 0.0    Basophils Relative 01/29/2024 0    Basophils Absolute 01/29/2024 0.0    WBC Morphology 01/29/2024 MORPHOLOGY UNREMARKABLE    RBC Morphology 01/29/2024 MORPHOLOGY UNREMARKABLE    Smear Review 01/29/2024 PLATELET COUNT CONFIRMED BY SMEAR    Immature Granulocytes 01/29/2024 1    Abs Immature Granulocytes 01/29/2024 0.03   There may be more visits with results that are not included.   No image results found.   US  ABDOMEN LIMITED RUQ (LIVER/GB) Result Date: 07/28/2024 CLINICAL DATA:  Cirrhosis EXAM: ULTRASOUND ABDOMEN LIMITED RIGHT UPPER QUADRANT COMPARISON:  01/26/2024 FINDINGS: Gallbladder: Status Post Cholecystectomy Common bile duct:  Diameter: 7 mm Liver: Parenchymal echogenicity: Mildly coarsened Contours: Nodular Lesions: None Portal vein: Patent.  Hepatopetal flow Other: Recanalized periumbilical vein again seen consistent with portal hypertension. IMPRESSION: Cirrhotic liver morphology without focal hepatic lesion. Electronically Signed   By: Aliene Lloyd M.D.   On: 07/28/2024 09:04   ECHOCARDIOGRAM COMPLETE Result Date: 07/10/2024    ECHOCARDIOGRAM REPORT   Patient Name:   Derek Drake Date of Exam: 07/10/2024 Medical Rec #:  [id number]     Height:       71.0 in Accession #:    [id number]    Weight:       188.3 lb Date of Birth:  March 29, 1946      BSA:          2.055 m Patient Age:    78 years      BP:           139/68 mmHg Patient Gender: M             HR:           81 bpm. Exam Location:  [facility] Procedure: 2D Echo, Cardiac Doppler and Color Doppler (Both Spectral and Color            Flow Doppler were utilized during procedure). Indications:    Syncope R55  History:        Patient has prior history of Echocardiogram examinations, most                 recent 01/25/2021. Arrythmias:Atrial Fibrillation,                 Signs/Symptoms:Syncope; Risk Factors:Hypertension, Diabetes and                 Dyslipidemia.  Sonographer:    Aida Pizza RCS Referring Phys: [phone] DAWOOD S ELGERGAWY IMPRESSIONS  1. Left ventricular ejection fraction, by estimation, is 60 to 65%. The left ventricle has normal function. The left ventricle has no regional wall motion abnormalities. There is mild concentric left ventricular hypertrophy.  Indeterminate diastolic filling due to E-A fusion.  2. Right ventricular systolic function is normal. The right ventricular size is normal. There is normal pulmonary artery systolic pressure. The estimated right ventricular systolic pressure is 31.5 mmHg.  3. The mitral valve is grossly normal. Mild mitral valve regurgitation. No evidence of mitral stenosis.  4. The aortic valve is tricuspid. Aortic valve regurgitation is  not visualized. No aortic stenosis is present.  5. The inferior vena cava is normal in size with greater than 50% respiratory variability, suggesting right atrial pressure of 3 mmHg. Comparison(s): No significant change from prior study. FINDINGS  Left Ventricle: Left ventricular ejection fraction, by estimation, is 60 to 65%. The left ventricle has normal function. The left ventricle has no regional wall motion abnormalities. The left ventricular internal cavity size was normal in size. There is  mild concentric left ventricular hypertrophy. Indeterminate diastolic filling due to E-A fusion. Right Ventricle: The right ventricular size is normal. No increase in right ventricular wall thickness. Right ventricular systolic function is normal. There is normal pulmonary artery systolic pressure. The tricuspid regurgitant velocity is 2.67 m/s, and  with an assumed right atrial pressure of 3 mmHg, the estimated right ventricular systolic pressure is 31.5 mmHg. Left Atrium: Left atrial size was normal in size. Right Atrium: Right atrial size was normal in size. Pericardium: There is no evidence of pericardial effusion. Mitral Valve: The mitral valve is grossly normal. Mild mitral valve regurgitation. No evidence of mitral valve stenosis. Tricuspid Valve: The tricuspid valve is grossly normal. Tricuspid valve regurgitation is mild . No evidence of tricuspid stenosis. Aortic Valve: The aortic valve is tricuspid. Aortic valve regurgitation is not visualized. No aortic stenosis is present. Pulmonic Valve: The pulmonic valve was grossly normal. Pulmonic valve regurgitation is not visualized. No evidence of pulmonic stenosis. Aorta: The aortic root is normal in size and structure. Venous: The inferior vena cava is normal in size with greater than 50% respiratory variability, suggesting right atrial pressure of 3 mmHg. IAS/Shunts: The atrial septum is grossly normal.  LEFT VENTRICLE PLAX 2D LVIDd:         4.70 cm   Diastology  LVIDs:         2.40 cm   LV e' lateral:   8.08 cm/s LV PW:         1.20 cm   LV E/e' lateral: 13.9 LV IVS:        1.10 cm LVOT diam:     2.20 cm LV SV:         103 LV SV Index:   50 LVOT Area:     3.80 cm  RIGHT VENTRICLE RV S prime:     15.90 cm/s TAPSE (M-mode): 3.2 cm LEFT ATRIUM           Index        RIGHT ATRIUM           Index LA diam:      3.90 cm 1.90 cm/m   RA Area:     22.70 cm LA Vol (A2C): 74.7 ml 36.35 ml/m  RA Volume:   71.50 ml  34.79 ml/m LA Vol (A4C): 70.2 ml 34.16 ml/m  AORTIC VALVE LVOT Vmax:   116.00 cm/s LVOT Vmean:  85.600 cm/s LVOT VTI:    0.270 m  AORTA Ao Root diam: 3.30 cm MITRAL VALVE                TRICUSPID VALVE MV Area (PHT): 3.12 cm  TR Peak grad:   28.5 mmHg MV Decel Time: 243 msec     TR Vmax:        267.00 cm/s MV E velocity: 112.00 cm/s MV A velocity: 134.00 cm/s  SHUNTS MV E/A ratio:  0.84         Systemic VTI:  0.27 m                             Systemic Diam: 2.20 cm Darryle Decent MD Electronically signed by Darryle Decent MD Signature Date/Time: 07/10/2024/12:08:00 PM    Final    MR BRAIN WO CONTRAST Result Date: 07/09/2024 EXAM: MRI BRAIN WITHOUT CONTRAST 07/09/2024 07:08:25 PM TECHNIQUE: Multiplanar multisequence MRI of the head/brain was performed without the administration of intravenous contrast. COMPARISON: CT head 07/09/2024. CLINICAL HISTORY: AMS, Confusion. Pt BIB EMS from a syncopal episode. FINDINGS: BRAIN AND VENTRICLES: No acute infarct. No intracranial hemorrhage. No mass. No midline shift. No hydrocephalus. The sella is unremarkable. Normal flow voids. ORBITS: No acute abnormality. SINUSES AND MASTOIDS: No acute abnormality. BONES AND SOFT TISSUES: Normal marrow signal. No acute soft tissue abnormality. IMPRESSION: 1. No acute intracranial abnormality. Electronically signed by: Gilmore Molt MD 07/09/2024 07:19 PM EDT RP Workstation: [id number]   DG Chest Portable 1 View Result Date: 07/09/2024 CLINICAL DATA:  Syncopal episode. EXAM: PORTABLE  CHEST 1 VIEW COMPARISON:  March 13, 2022 FINDINGS: The cardiac silhouette is mildly enlarged. Low lung volumes are noted. No acute infiltrate, pleural effusion or pneumothorax is identified. Radiopaque surgical clips are seen overlying the right upper quadrant. The visualized skeletal structures are unremarkable. IMPRESSION: No active disease. Electronically Signed   By: Suzen Dials M.D.   On: 07/09/2024 16:48   CT Head Wo Contrast Result Date: 07/09/2024 CLINICAL DATA:  Status post trauma. EXAM: CT HEAD WITHOUT CONTRAST TECHNIQUE: Contiguous axial images were obtained from the base of the skull through the vertex without intravenous contrast. RADIATION DOSE REDUCTION: This exam was performed according to the departmental dose-optimization program which includes automated exposure control, adjustment of the mA and/or kV according to patient size and/or use of iterative reconstruction technique. COMPARISON:  June 19, 2023 FINDINGS: Brain: No evidence of acute infarction, hemorrhage, hydrocephalus, extra-axial collection or mass lesion/mass effect. Vascular: No hyperdense vessel or unexpected calcification. Skull: Normal. Negative for fracture or focal lesion. Sinuses/Orbits: No acute finding. Other: None. IMPRESSION: No acute intracranial pathology. Electronically Signed   By: Suzen Dials M.D.   On: 07/09/2024 16:46    US  ABDOMEN LIMITED RUQ (LIVER/GB) Result Date: 07/28/2024 CLINICAL DATA:  Cirrhosis EXAM: ULTRASOUND ABDOMEN LIMITED RIGHT UPPER QUADRANT COMPARISON:  01/26/2024 FINDINGS: Gallbladder: Status Post Cholecystectomy Common bile duct: Diameter: 7 mm Liver: Parenchymal echogenicity: Mildly coarsened Contours: Nodular Lesions: None Portal vein: Patent.  Hepatopetal flow Other: Recanalized periumbilical vein again seen consistent with portal hypertension. IMPRESSION: Cirrhotic liver morphology without focal hepatic lesion. Electronically Signed   By: Aliene Lloyd M.D.   On: 07/28/2024  09:04      ASSESSMENT & PLAN   Assessment & Plan Syncope and collapse Dyspnea on exertion Agree with cardiology assessment from hospital; most likely due to diltiazem  adverse event/side effect(s).  He is all better and should not recur after discontinuation diltiazem . Long term current use of oral hypoglycemic drug We discussed G7 continuous glucose monitor and I gave him sample receiver and prescription to assess if he is going low due to syncope AND Hemoglobin A1c  Lab Results  Component Value Date   HGBA1C 4.8 07/10/2024    PAF (paroxysmal atrial fibrillation) (HCC) Recent syncope is likely due to diltiazem -induced bradycardia. He occasionally experiences palpitations, but he always knows when it happens and it is a benign condition known as PVC (premature ventricular contraction). A potential need for a pacemaker or loop recorder was discussed. Aspirin  therapy was considered but deferred due to low platelet count and bleeding risk. Continue follow-up with the cardiologist on Friday. Consider a loop recorder or pacemaker if indicated by the cardiologist. Aspirin  has been discontinued due to low platelet count and bleeding risk, and recent low hemoglobin and history recurrent arteriovenous malformation(s) (AVM) bleeding Type 2 diabetes mellitus with hyperlipidemia (HCC) Recent hypoglycemia is likely due to glimepiride. A1c is 4.8, indicating tight glycemic control. The metformin dose was adjusted to reduce hypoglycemia risk. Continuous glucose monitoring is recommended to assess glucose levels and guide medication adjustments. Glimepiride has been discontinued. Continue metformin at the current dose. Initiated continuous glucose monitoring with Dexcom G7. Reassess glucose levels and medication regimen based on monitoring results. Other pancytopenia (HCC) MDS (myelodysplastic syndrome) (HCC) This chronic condition presents with a low platelet count. Aspirin  therapy is deferred due to bleeding  risk. Coordination with the hematology team is ongoing. Continue coordination with the hematology team for management. BPH with lower urinary tract symptoms without urinary obstruction Tamsulosin was previously used but not effective. There was a discussion on timing of administration and potential discontinuation if not beneficial. Discontinue tamsulosin if not effective in reducing nocturia.  Other hyperlipidemia He has long-term use of Zocor . There was a discussion on potential medication adjustments pending cholesterol profile results. A cholesterol profile was ordered to assess current lipid levels. Reassess the need for Zocor  based on cholesterol results.  Stage 3 chronic kidney disease, unspecified whether stage 3a or 3b CKD (HCC) He has borderline kidney function. There was a discussion on medication adjustments to avoid nephrotoxicity. Continue to monitor kidney function and adjust medications as needed.  Liver cirrhosis secondary to NASH [facility]) A recent liver scan shows a stable condition. Coordination with the liver specialist is ongoing. Continue follow-up with the liver specialist as scheduled.  ORDER ASSOCIATIONS  #   DIAGNOSIS / CONDITION ICD-10 ENCOUNTER ORDER     ICD-10-CM   1. Syncope and collapse  R55 CBC with Differential/Platelet    Comp Met (CMET)    Continuous Glucose Sensor (DEXCOM G7 SENSOR) MISC    Lipid panel    2. Dyspnea on exertion  R06.09 Continuous Glucose Sensor (DEXCOM G7 SENSOR) MISC    3. Long term current use of oral hypoglycemic drug  Z79.84 Continuous Glucose Sensor (DEXCOM G7 SENSOR) MISC    4. PAF (paroxysmal atrial fibrillation) (HCC)  I48.0 Lipid panel    5. Type 2 diabetes mellitus with hyperlipidemia (HCC)  E11.69 Semaglutide,0.25 or 0.5MG /DOS, (OZEMPIC, 0.25 OR 0.5 MG/DOSE,) 2 MG/3ML SOPN   E78.5 LDL cholesterol, direct    Microalbumin / creatinine urine ratio    6. Other pancytopenia (HCC)  D61.818     7. MDS (myelodysplastic syndrome)  (HCC)  D46.9     8. BPH with lower urinary tract symptoms without urinary obstruction  N40.1 tamsulosin (FLOMAX) 0.4 MG CAPS capsule    9. Other hyperlipidemia  E78.49 Lipid panel    10. Stage 3 chronic kidney disease, unspecified whether stage 3a or 3b CKD (HCC)  N18.30 Urinalysis w microscopic + reflex cultur    11. Liver cirrhosis secondary to NASH (HCC)  K75.81  K74.69      Diagnoses and all orders for this visit: Syncope and collapse -     CBC with Differential/Platelet -     Comp Met (CMET) -     Continuous Glucose Sensor (DEXCOM G7 SENSOR) MISC; 1 Act by Does not apply route daily. -     Lipid panel Dyspnea on exertion -     Continuous Glucose Sensor (DEXCOM G7 SENSOR) MISC; 1 Act by Does not apply route daily. Long term current use of oral hypoglycemic drug -     Continuous Glucose Sensor (DEXCOM G7 SENSOR) MISC; 1 Act by Does not apply route daily. PAF (paroxysmal atrial fibrillation) (HCC) -     Lipid panel Type 2 diabetes mellitus with hyperlipidemia (HCC) -     Semaglutide,0.25 or 0.5MG /DOS, (OZEMPIC, 0.25 OR 0.5 MG/DOSE,) 2 MG/3ML SOPN; Inject 0.25 mg into the skin once a week. Stop glimepiride if taking. -     LDL cholesterol, direct -     Microalbumin / creatinine urine ratio Other pancytopenia (HCC) MDS (myelodysplastic syndrome) (HCC) BPH with lower urinary tract symptoms without urinary obstruction -     tamsulosin (FLOMAX) 0.4 MG CAPS capsule; Take 1 capsule (0.4 mg total) by mouth at bedtime. If doesn't reduce waking up to urinate, stop taking. Other hyperlipidemia -     Lipid panel Stage 3 chronic kidney disease, unspecified whether stage 3a or 3b CKD (HCC) -     Urinalysis w microscopic + reflex cultur Liver cirrhosis secondary to NASH (HCC)  Recommended follow up: Return in about 2 months (around 10/02/2024). Future Appointments  Date Time Provider Department Center  08/03/2024  9:45 AM [facility] None  08/03/2024 10:45 AM [facility] NURSE  [facility] None  08/05/2024  3:30 PM Strader, Laymon HERO, PA-C [facility] [place] [facility]  08/24/2024  9:15 AM [facility] LAB [facility] None  08/24/2024 10:15 AM [facility] NURSE [facility] None  09/14/2024  9:30 AM [facility] LAB [facility] None  09/14/2024 10:45 AM [facility] NURSE [facility] None  10/04/2024  8:20 AM Jesus Bernardino MATSU, MD [facility]  10/05/2024 10:30 AM [facility] LAB [facility] None  10/05/2024 11:30 AM Geofm Delon BRAVO, NP [facility] None  10/05/2024 11:45 AM [facility] NURSE [facility] None        Additional notes: This document was synthesized by artificial intelligence (Abridge) using HIPAA-compliant recording of the clinical interaction;   We discussed the use of AI scribe software for clinical note transcription with the patient, who gave verbal consent to proceed.    Additional Info: This encounter employed state-of-the-art, real-time, collaborative documentation. The patient actively reviewed and assisted in updating their electronic medical record on a shared screen, ensuring transparency and facilitating joint problem-solving for the problem list, overview, and plan. This approach promotes accurate, informed care. The treatment plan was discussed and reviewed in detail, including medication safety, potential side effects, and all patient questions. We confirmed understanding and comfort with the plan. Follow-up instructions were established, including contacting the office for any concerns, returning if symptoms worsen, persist, or new symptoms develop, and precautions for potential emergency department visits.  Initial Appointment Goals:  This initial visit focused on establishing a foundation for the patient's care. We collaboratively reviewed his medical history and medications in detail, updating the chart as shown in the encounter. Given the extensive information, we prioritized addressing his most pressing concerns, which he reported were: New Pt (Pt is present to est care with  pcp)  While the complexity of the patient's medical  picture may necessitate further evaluation in subsequent visits, we were able to develop a preliminary care plan together. To expedite a comprehensive plan at the next visit, we encouraged the patient to gather relevant medical records from previous providers. This collaborative approach will ensure a more complete understanding of the patient's health and inform the development of a personalized care plan. We look forward to continuing the conversation and working together with the patient on achieving his health goals.   Collaborative Documentation:  Today's encounter utilized real-time, dynamic patient engagement.  Patients actively participate by directly reviewing and assisting in updating their medical records through a shared screen. This transparency empowers patients to visually confirm chart updates made by the healthcare provider.  This collaborative approach facilitates problem management as we jointly update the problem list, problem overview, and assessment/plan. Ultimately, this process enhances chart accuracy and completeness, fostering shared decision-making, patient education, and informed consent for tests and treatments.  Collaborative Treatment Planning:  Treatment plans were discussed and reviewed in detail.  Explained medication safety and potential side effects.  Encouraged participation and answered all patient questions, confirming understanding and comfort with the plan. Encouraged patient to contact our office if they have any questions or concerns. Agreed on patient returning to office if symptoms worsen, persist, or new symptoms develop.  ----------------------------------------------------- Bernardino KANDICE Cone, MD  08/02/2024 3:40 PM  [facility]:  [phone]

## 2024-08-02 NOTE — Assessment & Plan Note (Addendum)
 We discussed G7 continuous glucose monitor and I gave him sample receiver and prescription to assess if he is going low due to syncope AND Hemoglobin A1c  Lab Results  Component Value Date   HGBA1C 4.8 07/10/2024

## 2024-08-03 ENCOUNTER — Inpatient Hospital Stay

## 2024-08-03 ENCOUNTER — Inpatient Hospital Stay: Attending: Hematology

## 2024-08-03 VITALS — BP 146/81 | HR 77 | Temp 96.8°F | Resp 20

## 2024-08-03 DIAGNOSIS — D500 Iron deficiency anemia secondary to blood loss (chronic): Secondary | ICD-10-CM | POA: Insufficient documentation

## 2024-08-03 DIAGNOSIS — D61818 Other pancytopenia: Secondary | ICD-10-CM | POA: Insufficient documentation

## 2024-08-03 DIAGNOSIS — K922 Gastrointestinal hemorrhage, unspecified: Secondary | ICD-10-CM | POA: Insufficient documentation

## 2024-08-03 DIAGNOSIS — D469 Myelodysplastic syndrome, unspecified: Secondary | ICD-10-CM

## 2024-08-03 LAB — CBC WITH DIFFERENTIAL/PLATELET
Abs Immature Granulocytes: 0.06 K/uL (ref 0.00–0.07)
Basophils Absolute: 0 K/uL (ref 0.0–0.1)
Basophils Relative: 1 %
Eosinophils Absolute: 0.1 K/uL (ref 0.0–0.5)
Eosinophils Relative: 3 %
HCT: 33.5 % — ABNORMAL LOW (ref 39.0–52.0)
Hemoglobin: 11.1 g/dL — ABNORMAL LOW (ref 13.0–17.0)
Immature Granulocytes: 2 %
Lymphocytes Relative: 19 %
Lymphs Abs: 0.7 K/uL (ref 0.7–4.0)
MCH: 32.6 pg (ref 26.0–34.0)
MCHC: 33.1 g/dL (ref 30.0–36.0)
MCV: 98.2 fL (ref 80.0–100.0)
Monocytes Absolute: 0.9 K/uL (ref 0.1–1.0)
Monocytes Relative: 24 %
Neutro Abs: 1.9 K/uL (ref 1.7–7.7)
Neutrophils Relative %: 51 %
Platelets: 75 K/uL — ABNORMAL LOW (ref 150–400)
RBC: 3.41 MIL/uL — ABNORMAL LOW (ref 4.22–5.81)
RDW: 13.9 % (ref 11.5–15.5)
WBC: 3.6 K/uL — ABNORMAL LOW (ref 4.0–10.5)
nRBC: 0 % (ref 0.0–0.2)

## 2024-08-03 LAB — URINALYSIS W MICROSCOPIC + REFLEX CULTURE
Bacteria, UA: NONE SEEN /HPF
Bilirubin Urine: NEGATIVE
Glucose, UA: NEGATIVE
Hgb urine dipstick: NEGATIVE
Hyaline Cast: NONE SEEN /LPF
Ketones, ur: NEGATIVE
Leukocyte Esterase: NEGATIVE
Nitrites, Initial: NEGATIVE
Protein, ur: NEGATIVE
RBC / HPF: NONE SEEN /HPF (ref 0–2)
Specific Gravity, Urine: 1.006 (ref 1.001–1.035)
Squamous Epithelial / HPF: NONE SEEN /HPF (ref <=5)
WBC, UA: NONE SEEN /HPF (ref 0–5)
pH: 6 (ref 5.0–8.0)

## 2024-08-03 LAB — NO CULTURE INDICATED

## 2024-08-03 MED ORDER — LUSPATERCEPT-AAMT 75 MG ~~LOC~~ SOLR
1.0000 mg/kg | Freq: Once | SUBCUTANEOUS | Status: AC
  Administered 2024-08-03: 85 mg via SUBCUTANEOUS
  Filled 2024-08-03 (×2): qty 1.7

## 2024-08-03 NOTE — Progress Notes (Signed)
 Patient's Hgb 11.1 and blood pressure stable. Patient tolerated  Luspatercept  injection with no complaints voiced.  Site clean and dry with no bruising or swelling noted at site.  See MAR for details.  Band aid applied.  Patient stable during and after injection.  Vss with discharge and left in satisfactory condition with no s/s of distress noted. All follow ups as scheduled.   Derek Drake

## 2024-08-03 NOTE — Patient Instructions (Signed)
 Luspatercept Injection What is this medication? LUSPATERCEPT (lus PAT er sept) treats low levels of red blood cells (anemia) in the body in people with beta thalassemia or myelodysplastic syndromes. It works by helping the body make more red blood cells. This medicine may be used for other purposes; ask your health care provider or pharmacist if you have questions. COMMON BRAND NAME(S): REBLOZYL What should I tell my care team before I take this medication? They need to know if you have any of these conditions: Have had your spleen removed High blood pressure History of blood clots Tobacco use An unusual or allergic reaction to luspatercept, other medications, foods, dyes, or preservatives Pregnant or trying to get pregnant Breastfeeding How should I use this medication? This medication is injected under the skin. It is given by your care team in a hospital or clinic setting. Talk to your care team about the use of the medication in children. It is not approved for use in children. Overdosage: If you think you have taken too much of this medicine contact a poison control center or emergency room at once. NOTE: This medicine is only for you. Do not share this medicine with others. What if I miss a dose? Keep appointments for follow-up doses. It is important not to miss your dose. Call your care team if you are unable to keep an appointment. What may interact with this medication? Interactions are not expected. This list may not describe all possible interactions. Give your health care provider a list of all the medicines, herbs, non-prescription drugs, or dietary supplements you use. Also tell them if you smoke, drink alcohol, or use illegal drugs. Some items may interact with your medicine. What should I watch for while using this medication? Your condition will be monitored carefully while you are receiving this medication. You may need blood work done while you are taking this  medication. Talk to your care team if you may be pregnant. Serious birth defects can occur if you take this medication during pregnancy. Contraception is recommended while taking this medication. Your care team can help you find the option that works for you. Talk to your care team before breastfeeding. Changes to your treatment plan may be needed. What side effects may I notice from receiving this medication? Side effects that you should report to your care team as soon as possible: Allergic reactions--skin rash, itching, hives, swelling of the face, lips, tongue, or throat Blood clot--pain, swelling, or warmth in the leg, shortness of breath, chest pain Increase in blood pressure Severe back pain, numbness or weakness of the hands, arms, legs, or feet, loss of coordination, loss of bowel or bladder control Side effects that usually do not require medical attention (report these to your care team if they continue or are bothersome): Bone pain Dizziness Fatigue Headache Joint pain Muscle pain Stomach pain This list may not describe all possible side effects. Call your doctor for medical advice about side effects. You may report side effects to FDA at 1-800-FDA-1088. Where should I keep my medication? This medication is given in a hospital or clinic. It will not be stored at home. NOTE: This sheet is a summary. It may not cover all possible information. If you have questions about this medicine, talk to your doctor, pharmacist, or health care provider.  2024 Elsevier/Gold Standard (2023-01-30 00:00:00)

## 2024-08-04 ENCOUNTER — Ambulatory Visit: Payer: Self-pay | Admitting: Internal Medicine

## 2024-08-05 ENCOUNTER — Ambulatory Visit: Attending: Student | Admitting: Student

## 2024-08-05 ENCOUNTER — Encounter: Payer: Self-pay | Admitting: Student

## 2024-08-05 VITALS — BP 136/82 | HR 70 | Ht 71.0 in | Wt 181.4 lb

## 2024-08-05 DIAGNOSIS — R55 Syncope and collapse: Secondary | ICD-10-CM | POA: Insufficient documentation

## 2024-08-05 DIAGNOSIS — E782 Mixed hyperlipidemia: Secondary | ICD-10-CM | POA: Diagnosis present

## 2024-08-05 DIAGNOSIS — I10 Essential (primary) hypertension: Secondary | ICD-10-CM | POA: Diagnosis not present

## 2024-08-05 DIAGNOSIS — I480 Paroxysmal atrial fibrillation: Secondary | ICD-10-CM | POA: Insufficient documentation

## 2024-08-05 NOTE — Patient Instructions (Signed)
 Medication Instructions:   Your physician recommends that you continue on your current medications as directed. Please refer to the Current Medication list given to you today.   Labwork: None today  Testing/Procedures: None today  Follow-Up: 4-5 months Dr.[facility] or Brittany Strader,PA-C  Any Other Special Instructions Will Be Listed Below (If Applicable).  If you need a refill on your cardiac medications before your next appointment, please call your pharmacy.

## 2024-08-05 NOTE — Telephone Encounter (Signed)
 read by Christopher LELON Rams at 8:07PM on 08/04/2024.

## 2024-08-05 NOTE — Progress Notes (Unsigned)
 Cardiology Office Note    Date:  08/07/2024  ID:  Marquette, Blodgett 02/12/1946, MRN [id number] Cardiologist: Jayson Sierras, MD Cardiology APP:  Johnson Laymon HERO, PA-C { :  History of Present Illness:    Derek Drake is a 78 y.o. male with past medical history of paroxysmal atrial fibrillation, HTN, HLD, Type II DM, pancytopenia/iron-deficiency anemia and NASH cirrhosis with esophageal varices who presents to the office today for hospital follow-up.  He was examined by Dr. Sierras on 07/08/2024 and denied any recent palpitations or chest pain. Did report having intermittent dyspnea on exertion which was felt to be due to his anemia. Anticoagulation had previously been discontinued in the setting of recurrent GI bleeding and AVM's. He had also previously declined Watchman evaluation. He was continued on Cardizem  CD 360 mg daily, Lasix  20 mg as needed, Losartan  100 mg daily, Nadolol  20 mg daily and Simvastatin  40 mg daily.  In the interim, he was admitted to [facility] from 10/25 - 07/11/2024 for evaluation of a syncopal event which occurred while he was walking from his garage to the kitchen and he developed shortness of breath and lightheadedness and fell face down on the floor. It was felt that his episode was possibly due to bradycardia as he did have a significant first-degree AV block. He did not have any significant arrhythmias on telemetry. Echocardiogram during admission showed a preserved EF of 60 to 65% with mild LVH, normal RV function and mild MR. Given his AV block, Cardizem  was discontinued at the time of discharge.   In talking with the patient and his wife today, he reports overall doing well from a cardiac perspective since his recent hospitalization. He reports significant improvement in his energy level and breathing since Cardizem  CD was stopped during admission. Blood pressure has overall been well-controlled when checked at home. He denies any recent chest pain,  orthopnea, PND or pitting edema.  Studies Reviewed:   EKG: EKG is not ordered today. EKG from 07/10/2024 is reviewed and shows NSR, HR 72 with 1st degree AV block and LVH with repol abnormalities.   Echocardiogram: 06/2024 IMPRESSIONS     1. Left ventricular ejection fraction, by estimation, is 60 to 65%. The  left ventricle has normal function. The left ventricle has no regional  wall motion abnormalities. There is mild concentric left ventricular  hypertrophy. Indeterminate diastolic  filling due to E-A fusion.   2. Right ventricular systolic function is normal. The right ventricular  size is normal. There is normal pulmonary artery systolic pressure. The  estimated right ventricular systolic pressure is 31.5 mmHg.   3. The mitral valve is grossly normal. Mild mitral valve regurgitation.  No evidence of mitral stenosis.   4. The aortic valve is tricuspid. Aortic valve regurgitation is not  visualized. No aortic stenosis is present.   5. The inferior vena cava is normal in size with greater than 50%  respiratory variability, suggesting right atrial pressure of 3 mmHg.   Comparison(s): No significant change from prior study.    Risk Assessment/Calculations:   CHA2DS2-VASc Score = 4  This indicates a 4.8% annual risk of stroke. The patient's score is based upon: CHF History: 0 HTN History: 1 Diabetes History: 1 Stroke History: 0 Vascular Disease History: 0 Age Score: 2 Gender Score: 0    Physical Exam:   VS:  BP 136/82   Pulse 70   Ht 5' 11 (1.803 m)   Wt 181 lb 6.4 oz (82.3 kg)  SpO2 96%   BMI 25.30 kg/m    Wt Readings from Last 3 Encounters:  08/05/24 181 lb 6.4 oz (82.3 kg)  08/02/24 183 lb 6.4 oz (83.2 kg)  07/09/24 188 lb 4.4 oz (85.4 kg)     GEN: Well nourished, well developed male appearing in no acute distress NECK: No JVD; No carotid bruits CARDIAC: RRR, no murmurs, rubs, gallops RESPIRATORY:  Clear to auscultation without rales, wheezing or  rhonchi  ABDOMEN: Appears non-distended. No obvious abdominal masses. EXTREMITIES: No clubbing or cyanosis. No pitting edema.  Distal pedal pulses are 2+ bilaterally.   Assessment and Plan:   1. Paroxysmal atrial fibrillation (HCC) - He has a history of known paroxysmal atrial fibrillation and has been on Nadolol  for this given his history of NASH with esophageal varices. Continue Nadolol  20mg  daily. Will remain off Cardizem  for now.  - While his CHA2DS2-VASc score is 4, he is no longer on anticoagulation given history of GIB, AVM's and esophageal varices. Watchman device previously discussed by review of notes and he declined.   2. Essential hypertension - BP is slightly elevated at 136/82 during today's visit and he reports it has been well-controlled when checked at home. He was encouraged to continue to follow this. For now, will continue current medical therapy with Losartan  100 mg daily and Nadolol  20 mg daily. If BP remains above goal, can add Amlodipine. Would need to reduce Simvastatin  dose or switch to a different statin if adding Amlodipine.   3. Mixed hyperlipidemia - FLP earlier this month showed total cholesterol 115, triglycerides 113, HDL 35 and LDL 57. Continue current medical therapy with Simvastatin  40 mg daily.  4. Syncope, unspecified syncope type - Isolated episode in the setting of more significant 1st degree AV block. No recurrence since his admission and he reports overall improvement in his energy level and dyspnea. No indication for further testing at this time. If he were to have presyncope or recurrent syncope, would place a Zio patch to assess for significant arrhythmias.   Signed, Laymon CHRISTELLA Qua, PA-C

## 2024-08-07 ENCOUNTER — Other Ambulatory Visit: Payer: Self-pay

## 2024-08-07 ENCOUNTER — Encounter: Payer: Self-pay | Admitting: Student

## 2024-08-15 ENCOUNTER — Other Ambulatory Visit: Payer: Self-pay | Admitting: Internal Medicine

## 2024-08-15 ENCOUNTER — Encounter: Payer: Self-pay | Admitting: Internal Medicine

## 2024-08-15 DIAGNOSIS — Z7984 Long term (current) use of oral hypoglycemic drugs: Secondary | ICD-10-CM

## 2024-08-15 DIAGNOSIS — R55 Syncope and collapse: Secondary | ICD-10-CM

## 2024-08-15 DIAGNOSIS — R0609 Other forms of dyspnea: Secondary | ICD-10-CM

## 2024-08-18 MED ORDER — DEXCOM G7 SENSOR MISC: 1.0000 | 11 refills | Status: AC

## 2024-08-18 NOTE — Addendum Note (Signed)
 Addended by: Kaniel Kiang G on: 08/18/2024 09:10 PM   Modules accepted: Orders

## 2024-08-24 ENCOUNTER — Inpatient Hospital Stay

## 2024-08-24 ENCOUNTER — Telehealth: Payer: Self-pay | Admitting: Internal Medicine

## 2024-08-24 ENCOUNTER — Inpatient Hospital Stay: Attending: Hematology

## 2024-08-24 DIAGNOSIS — D61818 Other pancytopenia: Secondary | ICD-10-CM

## 2024-08-24 DIAGNOSIS — D500 Iron deficiency anemia secondary to blood loss (chronic): Secondary | ICD-10-CM | POA: Insufficient documentation

## 2024-08-24 DIAGNOSIS — D469 Myelodysplastic syndrome, unspecified: Secondary | ICD-10-CM

## 2024-08-24 DIAGNOSIS — K922 Gastrointestinal hemorrhage, unspecified: Secondary | ICD-10-CM | POA: Insufficient documentation

## 2024-08-24 LAB — COMPREHENSIVE METABOLIC PANEL WITH GFR
ALT: 21 U/L (ref 0–44)
AST: 32 U/L (ref 15–41)
Albumin: 4.5 g/dL (ref 3.5–5.0)
Alkaline Phosphatase: 87 U/L (ref 38–126)
Anion gap: 14 (ref 5–15)
BUN: 18 mg/dL (ref 8–23)
CO2: 22 mmol/L (ref 22–32)
Calcium: 9.8 mg/dL (ref 8.9–10.3)
Chloride: 98 mmol/L (ref 98–111)
Creatinine, Ser: 1.18 mg/dL (ref 0.61–1.24)
GFR, Estimated: >60 mL/min (ref >60)
Glucose, Bld: 255 mg/dL — ABNORMAL HIGH (ref 70–99)
Potassium: 5.1 mmol/L (ref 3.5–5.1)
Sodium: 134 mmol/L — ABNORMAL LOW (ref 135–145)
Total Bilirubin: 0.6 mg/dL (ref 0.0–1.2)
Total Protein: 6.6 g/dL (ref 6.5–8.1)

## 2024-08-24 LAB — CBC WITH DIFFERENTIAL/PLATELET
Abs Immature Granulocytes: 0.07 K/uL (ref 0.00–0.07)
Basophils Absolute: 0 K/uL (ref 0.0–0.1)
Basophils Relative: 1 %
Eosinophils Absolute: 0.1 K/uL (ref 0.0–0.5)
Eosinophils Relative: 3 %
HCT: 37.9 % — ABNORMAL LOW (ref 39.0–52.0)
Hemoglobin: 13 g/dL (ref 13.0–17.0)
Immature Granulocytes: 2 %
Lymphocytes Relative: 21 %
Lymphs Abs: 0.9 K/uL (ref 0.7–4.0)
MCH: 32.1 pg (ref 26.0–34.0)
MCHC: 34.3 g/dL (ref 30.0–36.0)
MCV: 93.6 fL (ref 80.0–100.0)
Monocytes Absolute: 0.9 K/uL (ref 0.1–1.0)
Monocytes Relative: 22 %
Neutro Abs: 2.2 K/uL (ref 1.7–7.7)
Neutrophils Relative %: 51 %
Platelets: 73 K/uL — ABNORMAL LOW (ref 150–400)
RBC: 4.05 MIL/uL — ABNORMAL LOW (ref 4.22–5.81)
RDW: 13.2 % (ref 11.5–15.5)
WBC: 4.2 K/uL (ref 4.0–10.5)
nRBC: 0 % (ref 0.0–0.2)

## 2024-08-24 NOTE — Progress Notes (Signed)
 No injection needed today hemoglobin 13.0, patient discharged in satisfactory condition.

## 2024-08-24 NOTE — Telephone Encounter (Signed)
 Spoke w/patient to schedule AWV and he has some questions regarding his Metformin  and Glipizide meds.  Could someone give him a call back regarding these medications.  Thank you Darice FORBES [place] [facility] AWV TEAM Direct Dial [phone]

## 2024-08-29 ENCOUNTER — Encounter: Payer: Self-pay | Admitting: Internal Medicine

## 2024-08-29 DIAGNOSIS — E1140 Type 2 diabetes mellitus with diabetic neuropathy, unspecified: Secondary | ICD-10-CM

## 2024-08-29 MED ORDER — GABAPENTIN 100 MG PO CAPS: 100.0000 mg | ORAL_CAPSULE | Freq: Every day | ORAL | 4 refills | Status: AC

## 2024-08-31 NOTE — Telephone Encounter (Signed)
 Please see previous message

## 2024-08-31 NOTE — Telephone Encounter (Signed)
 Spoke with pt about Metformin  pt stated he started back on this for blood sugar he has stop the ozempic  makes him sick on his stomach.

## 2024-08-31 NOTE — Addendum Note (Signed)
 Addended by: Katina Remick G on: 08/31/2024 05:54 PM   Modules accepted: Orders

## 2024-09-05 ENCOUNTER — Encounter: Payer: Self-pay | Admitting: Internal Medicine

## 2024-09-05 DIAGNOSIS — I10 Essential (primary) hypertension: Secondary | ICD-10-CM

## 2024-09-05 MED ORDER — LOSARTAN POTASSIUM 100 MG PO TABS: 100.0000 mg | ORAL_TABLET | Freq: Every day | ORAL | 4 refills | Status: AC

## 2024-09-07 ENCOUNTER — Other Ambulatory Visit: Payer: Self-pay | Admitting: Oncology

## 2024-09-07 DIAGNOSIS — D469 Myelodysplastic syndrome, unspecified: Secondary | ICD-10-CM

## 2024-09-12 ENCOUNTER — Encounter: Payer: Self-pay | Admitting: *Deleted

## 2024-09-14 ENCOUNTER — Inpatient Hospital Stay

## 2024-09-14 DIAGNOSIS — D500 Iron deficiency anemia secondary to blood loss (chronic): Secondary | ICD-10-CM | POA: Diagnosis not present

## 2024-09-14 DIAGNOSIS — D469 Myelodysplastic syndrome, unspecified: Secondary | ICD-10-CM

## 2024-09-14 LAB — CBC WITH DIFFERENTIAL/PLATELET
Abs Immature Granulocytes: 0.04 K/uL (ref 0.00–0.07)
Basophils Absolute: 0 K/uL (ref 0.0–0.1)
Basophils Relative: 1 %
Eosinophils Absolute: 0.1 K/uL (ref 0.0–0.5)
Eosinophils Relative: 3 %
HCT: 37.3 % — ABNORMAL LOW (ref 39.0–52.0)
Hemoglobin: 12.7 g/dL — ABNORMAL LOW (ref 13.0–17.0)
Immature Granulocytes: 1 %
Lymphocytes Relative: 20 %
Lymphs Abs: 0.7 K/uL (ref 0.7–4.0)
MCH: 31.4 pg (ref 26.0–34.0)
MCHC: 34 g/dL (ref 30.0–36.0)
MCV: 92.1 fL (ref 80.0–100.0)
Monocytes Absolute: 0.9 K/uL (ref 0.1–1.0)
Monocytes Relative: 25 %
Neutro Abs: 1.8 K/uL (ref 1.7–7.7)
Neutrophils Relative %: 50 %
Platelets: 66 K/uL — ABNORMAL LOW (ref 150–400)
RBC: 4.05 MIL/uL — ABNORMAL LOW (ref 4.22–5.81)
RDW: 13.5 % (ref 11.5–15.5)
WBC: 3.6 K/uL — ABNORMAL LOW (ref 4.0–10.5)
nRBC: 0 % (ref 0.0–0.2)

## 2024-09-14 NOTE — Progress Notes (Signed)
 No injection needed today per parameters Hemoglobin 12.7.

## 2024-09-20 ENCOUNTER — Ambulatory Visit (INDEPENDENT_AMBULATORY_CARE_PROVIDER_SITE_OTHER)

## 2024-09-20 VITALS — BP 143/75 | HR 82 | Ht 71.0 in | Wt 180.0 lb

## 2024-09-20 DIAGNOSIS — Z0000 Encounter for general adult medical examination without abnormal findings: Secondary | ICD-10-CM | POA: Diagnosis not present

## 2024-09-20 NOTE — Progress Notes (Signed)
 " No voiced or noted concerns at this time. Chief Complaint  Patient presents with   Medicare Wellness     Subjective:   Derek Drake is a 79 y.o. male who presents for a Medicare Annual Wellness Visit.  Visit info / Clinical Intake: Medicare Wellness Visit Type:: Initial Annual Wellness Visit Persons participating in visit and providing information:: patient Medicare Wellness Visit Mode:: Telephone If telephone:: video error Since this visit was completed virtually, some vitals may be partially provided or unavailable. Missing vitals are due to the limitations of the virtual format.: Documented vitals are patient reported If Telephone or Video please confirm:: I connected with patient using audio/video enable telemedicine. I verified patient identity with two identifiers, discussed telehealth limitations, and patient agreed to proceed. Patient Location:: home Provider Location:: office Interpreter Needed?: No Pre-visit prep was completed: yes AWV questionnaire completed by patient prior to visit?: no Living arrangements:: lives with spouse/significant other Patient's Overall Health Status Rating: good Typical amount of pain: none Does pain affect daily life?: no Are you currently prescribed opioids?: no  Dietary Habits and Nutritional Risks How many meals a day?: 3 Eats fruit and vegetables daily?: yes Most meals are obtained by: having others provide food In the last 2 weeks, have you had any of the following?: none Diabetic:: (!) yes Any non-healing wounds?: no How often do you check your BS?: 0 (per patient every once in a while) Would you like to be referred to a Nutritionist or for Diabetic Management? : no  Functional Status Activities of Daily Living (to include ambulation/medication): Independent Ambulation: Independent Medication Administration: Independent Home Management (perform basic housework or laundry): Independent Manage your own finances?: yes Primary  transportation is: driving Concerns about vision?: no *vision screening is required for WTM* Concerns about hearing?: no  Fall Screening Falls in the past year?: 0 Number of falls in past year: 0 Was there an injury with Fall?: 0 Fall Risk Category Calculator: 0 Patient Fall Risk Level: Low Fall Risk  Fall Risk Patient at Risk for Falls Due to: No Fall Risks Fall risk Follow up: Falls evaluation completed; Education provided; Falls prevention discussed  Home and Transportation Safety: All rugs have non-skid backing?: yes All stairs or steps have railings?: yes Grab bars in the bathtub or shower?: yes Have non-skid surface in bathtub or shower?: yes Good home lighting?: yes Regular seat belt use?: yes Hospital stays in the last year:: (!) yes How many hospital stays:: 1 Reason: syncope and collapse  Cognitive Assessment Difficulty concentrating, remembering, or making decisions? : no Will 6CIT or Mini Cog be Completed: yes What year is it?: 0 points What month is it?: 0 points Give patient an address phrase to remember (5 components): [address] [facility] [place] About what time is it?: 0 points Count backwards from 20 to 1: 0 points Say the months of the year in reverse: 0 points Repeat the address phrase from earlier: 0 points 6 CIT Score: 0 points  Advance Directives (For Healthcare) Does Patient Have a Medical Advance Directive?: No Does patient want to make changes to medical advance directive?: No - Patient declined Would patient like information on creating a medical advance directive?: Yes (MAU/Ambulatory/Procedural Areas - Information given)  Reviewed/Updated  Reviewed/Updated: Reviewed All (Medical, Surgical, Family, Medications, Allergies, Care Teams, Patient Goals)    Allergies (verified) Rabbit protein, Hydrocodone-acetaminophen , Other, and Ivp dye [iodinated contrast media]   Current Medications (verified) Outpatient Encounter Medications as of 09/20/2024   Medication Sig  cholecalciferol  (VITAMIN D3) 25 MCG (1000 UNIT) tablet Take 1,000 Units by mouth daily.   [employer] IRON 325 (65 Fe) MG tablet Take 325 mg by mouth 2 (two) times daily.   furosemide  (LASIX ) 20 MG tablet Take 1 tablet (20 mg total) by mouth daily as needed.   gabapentin  (NEURONTIN ) 100 MG capsule Take 1-2 capsules (100-200 mg total) by mouth at bedtime.   glimepiride (AMARYL) 4 MG tablet Take 4 mg by mouth daily.   Krill Oil 500 MG CAPS Take 500 mg by mouth daily.   losartan  (COZAAR ) 100 MG tablet Take 1 tablet (100 mg total) by mouth daily.   metFORMIN  (GLUCOPHAGE ) 500 MG tablet Take 1-2 tablets (500-1,000 mg total) by mouth 2 (two) times daily. Taking 2 at night and 1 in the morning   milk thistle 175 MG tablet Take 175 mg by mouth daily.   Multiple Vitamin (MULTIVITAMIN WITH MINERALS) TABS tablet Take 1 tablet by mouth daily.   nadolol  (CORGARD ) 20 MG tablet Take 1 tablet (20 mg total) by mouth daily.   pantoprazole  (PROTONIX ) 40 MG tablet TAKE 1 TABLET BY MOUTH EVERY DAY BEFORE BREAKFAST   simvastatin  (ZOCOR ) 40 MG tablet Take 40 mg by mouth every evening.    tamsulosin  (FLOMAX ) 0.4 MG CAPS capsule Take 1 capsule (0.4 mg total) by mouth at bedtime. If doesn't reduce waking up to urinate, stop taking.   Continuous Glucose Sensor (DEXCOM G7 SENSOR) MISC 1 each by Does not apply route every 14 (fourteen) days.   No facility-administered encounter medications on file as of 09/20/2024.    History: Past Medical History:  Diagnosis Date   Allergy    Anemia    Blood transfusion without reported diagnosis    Cirrhosis (HCC)    Immune to Hep A. Received Hep B vaccination in 2022.   Essential hypertension    Gastrointestinal hemorrhage    GERD (gastroesophageal reflux disease)    Heme + stool 03/22/2020   History of alcohol abuse 12/07/2015   Hypercholesteremia    PAF (paroxysmal atrial fibrillation) (HCC) 05/04/2014   Type 2 diabetes mellitus [facility])    Past Surgical History:   Procedure Laterality Date   BIOPSY  02/05/2017   Procedure: BIOPSY;  Surgeon: Shaaron Lamar HERO, MD;  [facility];  Service: Endoscopy;;  gastric colon   CHOLECYSTECTOMY     COLONOSCOPY N/A 05/13/2013   Procedure: COLONOSCOPY;  Surgeon: Lamar HERO Shaaron, MD;  [facility];  Service: Endoscopy;  Laterality: N/A;  8:30 AM   COLONOSCOPY N/A 02/05/2017   Procedure: COLONOSCOPY;  Surgeon: Shaaron Lamar HERO, MD;  [facility];  Service: Endoscopy;  Laterality: N/A;   COLONOSCOPY WITH PROPOFOL  N/A 03/26/2022   Procedure: COLONOSCOPY WITH PROPOFOL ;  Surgeon: Shaaron Lamar HERO, MD;  [facility];  Service: Endoscopy;  Laterality: N/A;  7:30am, moved to 7/12 @ 2:45   COLONOSCOPY WITH PROPOFOL  N/A 04/14/2022   Procedure: COLONOSCOPY WITH PROPOFOL ;  Surgeon: Cindie Carlin POUR, DO;  [facility];  Service: Endoscopy;  Laterality: N/A;   ESOPHAGOGASTRODUODENOSCOPY N/A 02/05/2017   Procedure: ESOPHAGOGASTRODUODENOSCOPY (EGD);  Surgeon: Shaaron Lamar HERO, MD;  [facility];  Service: Endoscopy;  Laterality: N/A;  730 - moved to 12:00    ESOPHAGOGASTRODUODENOSCOPY N/A 07/04/2020   Procedure: ESOPHAGOGASTRODUODENOSCOPY (EGD);  Surgeon: Shaaron Lamar HERO, MD;  [facility];  Service: Endoscopy;  Laterality: N/A;  12:45pm   ESOPHAGOGASTRODUODENOSCOPY (EGD) WITH PROPOFOL  N/A 01/23/2022   3  columns of grade 1-2 varices, portal gastropathy, normal duodenum. Begin Nadolol  20 mg daily.   GIVENS CAPSULE STUDY N/A 07/04/2020   Procedure: GIVENS CAPSULE STUDY;  Surgeon: Shaaron Lamar HERO, MD;  [facility];  Service: Endoscopy;  Laterality: N/A;  Elected not to place a capsule today   GIVENS CAPSULE STUDY N/A 04/12/2022   Procedure: GIVENS CAPSULE STUDY;  Surgeon: Eartha Angelia Sieving, MD;  [facility];  Service: Gastroenterology;  Laterality: N/A;   HOT HEMOSTASIS  03/26/2022   Procedure: HOT HEMOSTASIS (ARGON PLASMA  COAGULATION/BICAP);  Surgeon: Shaaron Lamar HERO, MD;  [facility];  Service: Endoscopy;;   HOT HEMOSTASIS  04/14/2022   Procedure: HOT HEMOSTASIS (ARGON PLASMA COAGULATION/BICAP);  Surgeon: Cindie Carlin POUR, DO;  [facility];  Service: Endoscopy;;   IR BONE MARROW BIOPSY & ASPIRATION  04/18/2024   POLYPECTOMY  02/05/2017   Procedure: POLYPECTOMY;  Surgeon: Shaaron Lamar HERO, MD;  [facility];  Service: Endoscopy;;  colon   Family History  Problem Relation Age of Onset   Prostate cancer Brother    Congestive Heart Failure Mother    Heart failure Brother    Colon cancer Neg Hx    Social History   Occupational History   Not on file  Tobacco Use   Smoking status: Former    Current packs/day: 0.00    Average packs/day: 2.0 packs/day for 53.0 years (106.0 ttl pk-yrs)    Types: Cigarettes    Start date: 05/25/1956    Quit date: 05/25/2009    Years since quitting: 15.3   Smokeless tobacco: Current    Types: Chew  Vaping Use   Vaping status: Never Used  Substance and Sexual Activity   Alcohol use: Not Currently   Drug use: No   Sexual activity: Yes   Tobacco Counseling Ready to quit: No Counseling given: Yes  SDOH Screenings   Food Insecurity: No Food Insecurity (09/20/2024)  Housing: Low Risk (09/20/2024)  Transportation Needs: No Transportation Needs (09/20/2024)  Utilities: Not At Risk (09/20/2024)  Depression (PHQ2-9): Low Risk (09/20/2024)  Financial Resource Strain: Low Risk (05/07/2022)   [facility]  Physical Activity: Sufficiently Active (09/20/2024)  Social Connections: Socially Integrated (09/20/2024)  Stress: No Stress Concern Present (09/20/2024)  Tobacco Use: High Risk (09/20/2024)  Health Literacy: Adequate Health Literacy (09/20/2024)   See flowsheets for full screening details  Depression Screen PHQ 2 & 9 Depression Scale- Over the past 2 weeks, how often have you been bothered by any of the following problems? Little interest  or pleasure in doing things: 0 Feeling down, depressed, or hopeless (PHQ Adolescent also includes...irritable): 0 PHQ-2 Total Score: 0 Trouble falling or staying asleep, or sleeping too much: 0 Feeling tired or having little energy: 0 Poor appetite or overeating (PHQ Adolescent also includes...weight loss): 0 Feeling bad about yourself - or that you are a failure or have let yourself or your family down: 0 Trouble concentrating on things, such as reading the newspaper or watching television (PHQ Adolescent also includes...like school work): 0 Moving or speaking so slowly that other people could have noticed. 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  Hearing/Vision screen Hearing Screening - Comments:: Patient denies any hearing difficulties.   Vision Screening - Comments:: Patient wears reading glasses only. Up to date with yearly exams.  Thresa Louder in [place] Immunizations and Health Maintenance Health Maintenance  Topic Date Due   FOOT EXAM  Never done   OPHTHALMOLOGY EXAM  Never done   Medicare Annual Wellness (AWV)  01/10/2020   COVID-19 Vaccine (3 - 2025-26 season) 05/16/2024   HEMOGLOBIN A1C  01/08/2025   Diabetic kidney evaluation - Urine ACR  08/02/2025   Diabetic kidney evaluation - eGFR measurement  08/24/2025   DTaP/Tdap/Td (2 - Td or Tdap) 07/17/2029   Pneumococcal Vaccine: 50+  Years  Completed   Influenza Vaccine  Completed   Hepatitis C Screening  Completed   Zoster Vaccines- Shingrix  Completed   Meningococcal B Vaccine  Aged Out   Colonoscopy  Discontinued        Assessment/Plan:  This is a routine wellness examination for Treyce.  Patient Care Team: Jesus Bernardino MATSU, MD as PCP - General (Internal Medicine) Debera Jayson MATSU, MD as Consulting Physician (Cardiology) Shaaron Lamar HERO, MD as Consulting Physician (Gastroenterology) Johnson, Laymon HERO, PA-C as Physician Assistant (Cardiology) Louder Thresa, [place] (Optometry) Davonna Siad, MD as Consulting Physician (Oncology) Kennedy Charmaine CROME, NP as Nurse Practitioner (Gastroenterology) Geofm Delon BRAVO, NP as Nurse Practitioner (Nurse Practitioner)  I have personally reviewed and noted the following in the patients chart:   Medical and social history Use of alcohol, tobacco or illicit drugs  Current medications and supplements including opioid prescriptions. Functional ability and status Nutritional status Physical activity Advanced directives List of other physicians Hospitalizations, surgeries, and ER visits in previous 12 months Vitals Screenings to include cognitive, depression, and falls Referrals and appointments  No orders of the defined types were placed in this encounter.  In addition, I have reviewed and discussed with patient certain preventive protocols, quality metrics, and best practice recommendations. A written personalized care plan for preventive services as well as general preventive health recommendations were provided to patient.   Katesha Eichel, CMA   09/20/2024   Return September 21, 2025 at 10:00 am, for In office Medicare Well Visit w  Wellness Nurse.  After Visit Summary: (MyChart) Due to this being a telephonic visit, the after visit summary with patients personalized plan was offered to patient via MyChart    "

## 2024-09-20 NOTE — Patient Instructions (Signed)
 Mr. Blubaugh,  Thank you for taking the time for your Medicare Wellness Visit. I appreciate your continued commitment to your health goals. Please review the care plan we discussed, and feel free to reach out if I can assist you further.  Please note that Annual Wellness Visits do not include a physical exam. Some assessments may be limited, especially if the visit was conducted virtually. If needed, we may recommend an in-person follow-up with your provider.  Ongoing Care Seeing your primary care provider every 3 to 6 months helps us  monitor your health and provide consistent, personalized care.   1 year follow up for Medicare well visit: September 21, 2025 at 10:00 am with medicare wellness nurse in office  Call your eye doctor to schedule your yearly eye exam.  A yearly diabetic foot exam is recommended for all patients who are diabetic. Please remove your socks and shoes when you see Dr. Jesus next so he can take a look.   Recommended Screenings:  Health Maintenance  Topic Date Due   Complete foot exam   Never done   Eye exam for diabetics  Never done   Medicare Annual Wellness Visit  01/10/2020   COVID-19 Vaccine (3 - 2025-26 season) 05/16/2024   Hemoglobin A1C  01/08/2025   Yearly kidney health urinalysis for diabetes  08/02/2025   Yearly kidney function blood test for diabetes  08/24/2025   DTaP/Tdap/Td vaccine (2 - Td or Tdap) 07/17/2029   Pneumococcal Vaccine for age over 17  Completed   Flu Shot  Completed   Hepatitis C Screening  Completed   Zoster (Shingles) Vaccine  Completed   Meningitis B Vaccine  Aged Out   Colon Cancer Screening  Discontinued       09/20/2024    8:55 AM  Advanced Directives  Does Patient Have a Medical Advance Directive? No  Would patient like information on creating a medical advance directive? Yes (MAU/Ambulatory/Procedural Areas - Information given)    Vision: Annual vision screenings are recommended for early detection of glaucoma, cataracts,  and diabetic retinopathy. These exams can also reveal signs of chronic conditions such as diabetes and high blood pressure.  Dental: Annual dental screenings help detect early signs of oral cancer, gum disease, and other conditions linked to overall health, including heart disease and diabetes.  Please see the attached documents for additional preventive care recommendations.

## 2024-09-21 ENCOUNTER — Other Ambulatory Visit: Payer: Self-pay

## 2024-10-04 ENCOUNTER — Ambulatory Visit: Admitting: Internal Medicine

## 2024-10-04 ENCOUNTER — Encounter: Payer: Self-pay | Admitting: Internal Medicine

## 2024-10-04 VITALS — BP 138/78 | HR 62 | Temp 98.0°F | Ht 71.0 in | Wt 182.2 lb

## 2024-10-04 DIAGNOSIS — E1169 Type 2 diabetes mellitus with other specified complication: Secondary | ICD-10-CM | POA: Diagnosis not present

## 2024-10-04 DIAGNOSIS — Z7984 Long term (current) use of oral hypoglycemic drugs: Secondary | ICD-10-CM | POA: Diagnosis not present

## 2024-10-04 DIAGNOSIS — E785 Hyperlipidemia, unspecified: Secondary | ICD-10-CM

## 2024-10-04 DIAGNOSIS — K7469 Other cirrhosis of liver: Secondary | ICD-10-CM

## 2024-10-04 DIAGNOSIS — K7581 Nonalcoholic steatohepatitis (NASH): Secondary | ICD-10-CM

## 2024-10-04 DIAGNOSIS — B351 Tinea unguium: Secondary | ICD-10-CM

## 2024-10-04 DIAGNOSIS — I480 Paroxysmal atrial fibrillation: Secondary | ICD-10-CM

## 2024-10-04 LAB — POCT GLYCOSYLATED HEMOGLOBIN (HGB A1C): Hemoglobin A1C: 7.7 % — AB (ref 4.0–5.6)

## 2024-10-04 MED ORDER — CICLOPIROX 8 % EX SOLN: Freq: Every day | CUTANEOUS | 0 refills | Status: AC

## 2024-10-04 MED ORDER — TRULICITY 0.75 MG/0.5ML ~~LOC~~ SOAJ: 0.7500 mg | SUBCUTANEOUS | 1 refills | Status: AC

## 2024-10-04 MED ORDER — GLIMEPIRIDE 4 MG PO TABS: 2.0000 mg | ORAL_TABLET | Freq: Every day | ORAL | 3 refills | Status: AC

## 2024-10-04 MED ORDER — METFORMIN HCL 500 MG PO TABS: 500.0000 mg | ORAL_TABLET | Freq: Two times a day (BID) | ORAL | 2 refills | Status: AC

## 2024-10-04 NOTE — Assessment & Plan Note (Signed)
 He has a history of syncope and is currently not on anticoagulation due to bleeding risk and history heavy bleeding. He reports occasional irregular heartbeats but no recent syncope. No syncopal symptom(s) since discontinued diltiazem 

## 2024-10-04 NOTE — Progress Notes (Signed)
 ==============================  [facility]: [id number]   -- Medical Office Visit --  Patient: Derek Drake      Age: 79 y.o.       Sex:  male  Date:   10/04/2024 Today's Healthcare Provider: Bernardino KANDICE Cone, MD  ==============================   Chief Complaint: Diabetes  Discussed the use of AI scribe software for clinical note transcription with the patient, who gave verbal consent to proceed. History of Present Illness  79 year old male who presents for a follow-up visit regarding diabetes management.  He has been managing his diabetes with two metformin  at night and one glimepiride  in the morning, a regimen he adjusted himself. This adjustment has allowed him to eat better. His blood sugar readings at home have been variable, with recent readings of 200 mg/dL and 865 mg/dL. He notes improvement in blood sugar levels since taking two metformin  in the morning and two at night. No recent episodes of hypoglycemia have occurred.  He has a history of anemia and feels much better after receiving shots at the hospital. His blood count was critically low at 6.5 g/dL, but since treatment, he feels great and no longer experiences symptoms of anemia such as lightheadedness or shortness of breath. He is scheduled for a blood count check tomorrow.  He has a history of liver issues, including hepatitis and jaundice, which he believes contributed to a significant bleeding episode in July 2023. He abstains from alcohol due to liver issues. He recalls being told that he had multiple bleeding sites in his colon, possibly related to liver issues.  He has a history of atrial fibrillation but reports that it has not been a significant issue recently. He is not on blood thinners due to his bleeding history. He used to take diltiazem , which was discontinued as it was thought to contribute to episodes of passing out.  He reports a fungal infection on his toenails and  heels, which he manages with antifungal cream. He has a history of nerve damage in his feet, for which he takes gabapentin . He reports feeling sensations in his feet. He prefers to manage the fungal infection with cream rather than seeing a foot specialist.  No chest pain, shortness of breath, or recent episodes of passing out. He feels great overall.  Lab Results  Component Value Date   HGB 12.7 (L) 09/14/2024   HGB 13.0 08/24/2024   HGB 11.1 (L) 08/03/2024   HGB 12.4 (L) 08/02/2024   HGB 8.9 (L) 07/12/2024   HGB 8.0 (L) 07/11/2024   HGB 7.1 (L) 07/10/2024   HGB 8.4 (L) 07/09/2024   HGB 8.9 (L) 06/29/2024   HGB 8.9 (L) 06/21/2024   HGB 9.1 (L) 05/04/2024   HGB 8.4 (L) 04/18/2024   HGB 8.5 (L) 03/24/2024   HGB 9.7 (L) 01/29/2024   HGB 9.4 (L) 10/02/2023   HGB 9.0 (L) 04/29/2023   HGB 8.8 (L) 04/15/2023   HGB 9.5 (L) 02/20/2023   HGB 8.9 (L) 02/04/2023   HGB 9.4 (L) 09/22/2022   HGB 9.5 (L) 07/18/2022   HGB 8.2 (L) 05/20/2022   HGB 8.3 (L) 04/14/2022   HGB 8.5 (L) 04/13/2022   HGB 7.0 (L) 04/12/2022   HGB 7.1 (L) 04/11/2022   HGB 6.5 (L) 04/10/2022   HGB 7.5 (L) 03/31/2022   HGB 7.2 (L) 03/27/2022   HGB 8.4 (L) 03/26/2022   HGB 7.7 (L) 03/20/2022   HGB 6.7 (LL) 03/13/2022   HGB 6.6 (  L) 03/11/2022   HGB 7.3 (L) 02/25/2022   HGB 7.4 (L) 02/20/2022   HGB 7.9 (L) 02/06/2022   HGB 8.2 (L) 01/21/2022   HGB 13.1 08/03/2020   HGB 10.5 (L) 07/04/2020   HGB 11.8 (L) 09/22/2019   HGB 13.3 08/24/2015   HGB 14.1 02/09/2015   HGB 14.5 08/22/2014   HGB 13.7 05/05/2014   HGB 15.4 05/04/2014   Lab Results  Component Value Date   BUN 18 08/24/2024   BUN 15 08/02/2024   BUN 16 07/11/2024   BUN 20 07/10/2024   BUN 26 (H) 07/09/2024   BUN 19 06/29/2024   BUN 19 06/21/2024   BUN 21 03/24/2024   BUN 19 01/29/2024   BUN 20 12/15/2023   BUN 18 10/02/2023   BUN 18 02/04/2023   BUN 10 04/14/2022   BUN 13 04/12/2022   BUN 17 04/11/2022   BUN 20 03/24/2022   BUN 17 03/13/2022    BUN 14 01/21/2022   BUN 10 08/03/2020   BUN 17 09/22/2019   BUN 13 08/24/2015   BUN 14 02/09/2015   BUN 12 08/22/2014   BUN 11 05/05/2014   BUN 12 05/04/2014   CREATININE 1.18 08/24/2024   CREATININE 1.09 08/02/2024   CREATININE 1.08 07/11/2024   CREATININE 1.17 07/10/2024   CREATININE 1.56 (H) 07/09/2024   CREATININE 1.33 (H) 06/29/2024   CREATININE 1.28 (H) 06/21/2024   CREATININE 1.32 (H) 03/24/2024   CREATININE 1.15 01/29/2024   CREATININE 1.19 12/15/2023   CREATININE 1.29 (H) 10/02/2023   CREATININE 1.04 02/04/2023   CREATININE 1.10 02/03/2023   CREATININE 0.98 04/14/2022   CREATININE 0.94 04/12/2022   CREATININE 1.08 04/11/2022   CREATININE 1.04 03/24/2022   CREATININE 1.03 03/13/2022   CREATININE 1.00 01/21/2022   CREATININE 0.99 08/03/2020   CREATININE 1.17 09/22/2019   CREATININE 0.88 08/24/2015   CREATININE 0.81 02/09/2015   CREATININE 0.93 08/22/2014   CREATININE 0.86 05/05/2014   CREATININE 0.87 05/04/2014   GFR 64.88 08/02/2024   EGFR 57 (L) 06/21/2024   EGFR 63 12/15/2023   MICROALBUR 1.4 08/02/2024   HGBA1C 7.7 (A) 10/04/2024   HGBA1C 4.8 07/10/2024   HGBA1C 5.2 04/13/2022   TOTALPROTELP 6.2 02/20/2023   TOTALPROTELP 6.1 02/20/2023   ALBUMINELP 3.6 02/20/2023   A1GS 0.2 02/20/2023   A2GS 0.8 02/20/2023   BETS 1.0 02/20/2023   GAMS 0.5 02/20/2023   MSPIKE Not Observed 02/20/2023   SPEI Comment 02/20/2023    Background Reviewed: Problem List: has PAF (paroxysmal atrial fibrillation) (HCC); Thrombocytopenia; Type 2 diabetes mellitus with hyperlipidemia (HCC); Essential hypertension, benign; Esophageal varices (HCC); Iron deficiency anemia due to chronic blood loss; Liver cirrhosis secondary to NASH (HCC); Gastroesophageal reflux disease; Mixed hyperlipidemia; Other pancytopenia (HCC); MDS (myelodysplastic syndrome) (HCC); Long term current use of oral hypoglycemic drug; and Splenomegaly on their problem list. Past Medical History:  has a past  medical history of Acute on chronic blood loss anemia (04/11/2022), Allergy, Anemia, Blood transfusion without reported diagnosis, Cirrhosis (HCC), Dyspnea on exertion (08/02/2024), Essential hypertension, Gastrointestinal hemorrhage, GERD (gastroesophageal reflux disease), Heme + stool (03/22/2020), Hepatitis (12/07/2015), History of alcohol abuse (12/07/2015), Hypercholesteremia, PAF (paroxysmal atrial fibrillation) (HCC) (05/04/2014), Syncope and collapse (07/09/2024), and Type 2 diabetes mellitus (HCC). Past Surgical History:   has a past surgical history that includes Cholecystectomy; Colonoscopy (N/A, 05/13/2013); Esophagogastroduodenoscopy (N/A, 02/05/2017); Colonoscopy (N/A, 02/05/2017); biopsy (02/05/2017); polypectomy (02/05/2017); Esophagogastroduodenoscopy (N/A, 07/04/2020); Givens capsule study (N/A, 07/04/2020); Esophagogastroduodenoscopy (egd) with propofol  (N/A, 01/23/2022); Colonoscopy with propofol  (N/A, 03/26/2022);  Hot hemostasis (03/26/2022); Colonoscopy with propofol  (N/A, 04/14/2022); Hot hemostasis (04/14/2022); Givens capsule study (N/A, 04/12/2022); and IR BONE MARROW BIOPSY & ASPIRATION (04/18/2024). Social History:   reports that he quit smoking about 15 years ago. His smoking use included cigarettes. He started smoking about 68 years ago. He has a 106 pack-year smoking history. His smokeless tobacco use includes chew. He reports that he does not currently use alcohol. He reports that he does not use drugs. Family History:  family history includes Congestive Heart Failure in his mother; Heart failure in his brother; Prostate cancer in his brother. Allergies:  is allergic to rabbit protein, hydrocodone-acetaminophen , other, and ivp dye [iodinated contrast media].   Medication Reconciliation: Current Outpatient Medications on File Prior to Visit  Medication Sig   cholecalciferol  (VITAMIN D3) 25 MCG (1000 UNIT) tablet Take 1,000 Units by mouth daily.   Continuous Glucose Sensor (DEXCOM G7  SENSOR) MISC 1 each by Does not apply route every 14 (fourteen) days.   [employer] IRON 325 (65 Fe) MG tablet Take 325 mg by mouth 2 (two) times daily.   gabapentin  (NEURONTIN ) 100 MG capsule Take 1-2 capsules (100-200 mg total) by mouth at bedtime.   Krill Oil 500 MG CAPS Take 500 mg by mouth daily.   losartan  (COZAAR ) 100 MG tablet Take 1 tablet (100 mg total) by mouth daily.   milk thistle 175 MG tablet Take 175 mg by mouth daily.   Multiple Vitamin (MULTIVITAMIN WITH MINERALS) TABS tablet Take 1 tablet by mouth daily.   nadolol  (CORGARD ) 20 MG tablet Take 1 tablet (20 mg total) by mouth daily.   pantoprazole  (PROTONIX ) 40 MG tablet TAKE 1 TABLET BY MOUTH EVERY DAY BEFORE BREAKFAST   simvastatin  (ZOCOR ) 40 MG tablet Take 40 mg by mouth every evening.    tamsulosin  (FLOMAX ) 0.4 MG CAPS capsule Take 1 capsule (0.4 mg total) by mouth at bedtime. If doesn't reduce waking up to urinate, stop taking.   furosemide  (LASIX ) 20 MG tablet Take 1 tablet (20 mg total) by mouth daily as needed. (Patient not taking: Reported on 10/04/2024)   No current facility-administered medications on file prior to visit.   Medications Discontinued During This Encounter  Medication Reason   glimepiride  (AMARYL ) 4 MG tablet    metFORMIN  (GLUCOPHAGE ) 500 MG tablet Reorder     Physical Exam:    10/04/2024    8:05 AM 09/20/2024    8:50 AM 08/05/2024    3:52 PM  Vitals with BMI  Height 5' 11 5' 11   Weight 182 lbs 3 oz 180 lbs   BMI 25.42 25.12   Systolic 138 143 863  Diastolic 78 75 82  Pulse 62 82   Vital signs reviewed.  Nursing notes reviewed. Weight trend reviewed. Physical Activity: Sufficiently Active (09/20/2024)   Exercise Vital Sign    Days of Exercise per Week: 7 days    Minutes of Exercise per Session: 30 min   General Appearance:  No acute distress appreciable.   Well-groomed, healthy-appearing male.  Well proportioned with no abnormal fat distribution.  Good muscle tone. Pulmonary:  Normal work of  breathing at rest, no respiratory distress apparent. SpO2: 98 %  Musculoskeletal: All extremities are intact.  Neurological:  Awake, alert, oriented, and engaged.  No obvious focal neurological deficits or cognitive impairments.  Sensorium seems unclouded.   Speech is clear and coherent with logical content. Psychiatric:  Appropriate mood, pleasant and cooperative demeanor, thoughtful and engaged during the exam   Verbalized to  patient: Physical Exam EXTREMITIES: Good blood flow to feet, skin in great condition. Moderate fungal infection on heel and toenails. NEUROLOGICAL: Sensation intact with slight nerve damage.   Lab Results  Component Value Date   HGBA1C 7.7 (A) 10/04/2024   HGBA1C 4.8 07/10/2024   HGBA1C 5.2 04/13/2022   Title   Diabetic Foot Exam - detailed Is there a history of foot ulcer?: No Is there a foot ulcer now?: No Is there swelling?: No Is there elevated skin temperature?: No Is there abnormal foot shape?: No Is there a claw toe deformity?: No Are the toenails long?: Yes Are the toenails thick?: Yes Are the toenails ingrown?: Yes Is the skin thin, fragile, shiny and hairless?: Yes Normal Range of Motion?: Yes Is there foot or ankle muscle weakness?: No Do you have pain in calf while walking?: No Are the shoes appropriate in style and fit?: No Can the patient see the bottom of their feet?: No Right Posterior Tibialis: Present Left posterior Tibialis: Present   Right Dorsalis Pedis: Present Left Dorsalis Pedis: Present     Semmes-Weinstein Monofilament Test + means has sensation and - means no sensation  R Foot Test Control: Pos L Foot Test Control: Pos   R Site 1-Great Toe: Pos L Site 1-Great Toe: Pos   R Site 4: Pos L Site 4: Pos   R site 5: Pos L Site 5: Pos  R Site 6: Neg L Site 6: Neg     Image components are not supported.   Image components are not supported. Image components are not supported.  T[place] Comments Diabetic Foot  Exam: Appearance - no lesions, ulcers or calluses Skin - no sigificant pallor or erythema Monofilament testing - sensitive bilaterally in following locations:  Right - Great toe, medial, central, lateral ball and posterior foot intact  Left - Great toe, medial, central, lateral ball intact  and posterior foot absent Pulses - +2 distally bilaterally   Extensyive onychomycotic change nails and flaking heels    Results Labs Hemoglobin (08/2024): Within normal limits, stable from 6.5 on 07/2024 Hemoglobin A1c (10/04/2024): 7.7 increased from 5.2 and 4.8 on prior studies     09/20/2024    9:06 AM 08/02/2024    8:57 AM 06/29/2024   10:35 AM 05/04/2024   12:55 PM  PHQ 2/9 Scores  PHQ - 2 Score 0 0 0 0  PHQ- 9 Score 0      Lab Results  Component Value Date   HGBA1C 7.7 (A) 10/04/2024   HGBA1C 4.8 07/10/2024   HGBA1C 5.2 04/13/2022   Office Visit on 10/04/2024  Component Date Value Ref Range Status   Hemoglobin A1C 10/04/2024 7.7 (A)  4.0 - 5.6 % Final  Appointment on 09/14/2024  Component Date Value Ref Range Status   WBC 09/14/2024 3.6 (L)  4.0 - 10.5 K/uL Final   RBC 09/14/2024 4.05 (L)  4.22 - 5.81 MIL/uL Final   Hemoglobin 09/14/2024 12.7 (L)  13.0 - 17.0 g/dL Final   HCT 87/68/7974 37.3 (L)  39.0 - 52.0 % Final   MCV 09/14/2024 92.1  80.0 - 100.0 fL Final   MCH 09/14/2024 31.4  26.0 - 34.0 pg Final   MCHC 09/14/2024 34.0  30.0 - 36.0 g/dL Final   RDW 87/68/7974 13.5  11.5 - 15.5 % Final   Platelets 09/14/2024 66 (L)  150 - 400 K/uL Final   nRBC 09/14/2024 0.0  0.0 - 0.2 % Final   Neutrophils Relative % 09/14/2024 50  %  Final   Neutro Abs 09/14/2024 1.8  1.7 - 7.7 K/uL Final   Lymphocytes Relative 09/14/2024 20  % Final   Lymphs Abs 09/14/2024 0.7  0.7 - 4.0 K/uL Final   Monocytes Relative 09/14/2024 25  % Final   Monocytes Absolute 09/14/2024 0.9  0.1 - 1.0 K/uL Final   Eosinophils Relative 09/14/2024 3  % Final   Eosinophils Absolute 09/14/2024 0.1  0.0 - 0.5 K/uL  Final   Basophils Relative 09/14/2024 1  % Final   Basophils Absolute 09/14/2024 0.0  0.0 - 0.1 K/uL Final   WBC Morphology 09/14/2024 MORPHOLOGY UNREMARKABLE   Final   RBC Morphology 09/14/2024 MORPHOLOGY UNREMARKABLE   Final   Immature Granulocytes 09/14/2024 1  % Final   Abs Immature Granulocytes 09/14/2024 0.04  0.00 - 0.07 K/uL Final  Appointment on 08/24/2024  Component Date Value Ref Range Status   WBC 08/24/2024 4.2  4.0 - 10.5 K/uL Final   RBC 08/24/2024 4.05 (L)  4.22 - 5.81 MIL/uL Final   Hemoglobin 08/24/2024 13.0  13.0 - 17.0 g/dL Final   HCT 87/89/7974 37.9 (L)  39.0 - 52.0 % Final   MCV 08/24/2024 93.6  80.0 - 100.0 fL Final   MCH 08/24/2024 32.1  26.0 - 34.0 pg Final   MCHC 08/24/2024 34.3  30.0 - 36.0 g/dL Final   RDW 87/89/7974 13.2  11.5 - 15.5 % Final   Platelets 08/24/2024 73 (L)  150 - 400 K/uL Final   nRBC 08/24/2024 0.0  0.0 - 0.2 % Final   Neutrophils Relative % 08/24/2024 51  % Final   Neutro Abs 08/24/2024 2.2  1.7 - 7.7 K/uL Final   Lymphocytes Relative 08/24/2024 21  % Final   Lymphs Abs 08/24/2024 0.9  0.7 - 4.0 K/uL Final   Monocytes Relative 08/24/2024 22  % Final   Monocytes Absolute 08/24/2024 0.9  0.1 - 1.0 K/uL Final   Eosinophils Relative 08/24/2024 3  % Final   Eosinophils Absolute 08/24/2024 0.1  0.0 - 0.5 K/uL Final   Basophils Relative 08/24/2024 1  % Final   Basophils Absolute 08/24/2024 0.0  0.0 - 0.1 K/uL Final   WBC Morphology 08/24/2024 MORPHOLOGY UNREMARKABLE   Final   RBC Morphology 08/24/2024 MORPHOLOGY UNREMARKABLE   Final   Immature Granulocytes 08/24/2024 2  % Final   Abs Immature Granulocytes 08/24/2024 0.07  0.00 - 0.07 K/uL Final   Sodium 08/24/2024 134 (L)  135 - 145 mmol/L Final   Potassium 08/24/2024 5.1  3.5 - 5.1 mmol/L Final   Chloride 08/24/2024 98  98 - 111 mmol/L Final   CO2 08/24/2024 22  22 - 32 mmol/L Final   Glucose, Bld 08/24/2024 255 (H)  70 - 99 mg/dL Final   BUN 87/89/7974 18  8 - 23 mg/dL Final    Creatinine, Ser 08/24/2024 1.18  0.61 - 1.24 mg/dL Final   Calcium  08/24/2024 9.8  8.9 - 10.3 mg/dL Final   Total Protein 87/89/7974 6.6  6.5 - 8.1 g/dL Final   Albumin 87/89/7974 4.5  3.5 - 5.0 g/dL Final   AST 87/89/7974 32  15 - 41 U/L Final   ALT 08/24/2024 21  0 - 44 U/L Final   Alkaline Phosphatase 08/24/2024 87  38 - 126 U/L Final   Total Bilirubin 08/24/2024 0.6  0.0 - 1.2 mg/dL Final   GFR, Estimated 08/24/2024 >60  >60 mL/min Final   Anion gap 08/24/2024 14  5 - 15 Final  Appointment on 08/03/2024  Component Date Value Ref Range Status   WBC 08/03/2024 3.6 (L)  4.0 - 10.5 K/uL Final   RBC 08/03/2024 3.41 (L)  4.22 - 5.81 MIL/uL Final   Hemoglobin 08/03/2024 11.1 (L)  13.0 - 17.0 g/dL Final   HCT 88/80/7974 33.5 (L)  39.0 - 52.0 % Final   MCV 08/03/2024 98.2  80.0 - 100.0 fL Final   MCH 08/03/2024 32.6  26.0 - 34.0 pg Final   MCHC 08/03/2024 33.1  30.0 - 36.0 g/dL Final   RDW 88/80/7974 13.9  11.5 - 15.5 % Final   Platelets 08/03/2024 75 (L)  150 - 400 K/uL Final   nRBC 08/03/2024 0.0  0.0 - 0.2 % Final   Neutrophils Relative % 08/03/2024 51  % Final   Neutro Abs 08/03/2024 1.9  1.7 - 7.7 K/uL Final   Lymphocytes Relative 08/03/2024 19  % Final   Lymphs Abs 08/03/2024 0.7  0.7 - 4.0 K/uL Final   Monocytes Relative 08/03/2024 24  % Final   Monocytes Absolute 08/03/2024 0.9  0.1 - 1.0 K/uL Final   Eosinophils Relative 08/03/2024 3  % Final   Eosinophils Absolute 08/03/2024 0.1  0.0 - 0.5 K/uL Final   Basophils Relative 08/03/2024 1  % Final   Basophils Absolute 08/03/2024 0.0  0.0 - 0.1 K/uL Final   WBC Morphology 08/03/2024 MORPHOLOGY UNREMARKABLE   Final   Immature Granulocytes 08/03/2024 2  % Final   Abs Immature Granulocytes 08/03/2024 0.06  0.00 - 0.07 K/uL Final   Ovalocytes 08/03/2024 PRESENT   Final  Office Visit on 08/02/2024  Component Date Value Ref Range Status   WBC 08/02/2024 4.3  4.0 - 10.5 K/uL Final   RBC 08/02/2024 3.87 (L)  4.22 - 5.81 Mil/uL Final    Hemoglobin 08/02/2024 12.4 (L)  13.0 - 17.0 g/dL Final   HCT 88/81/7974 37.0 (L)  39.0 - 52.0 % Final   MCV 08/02/2024 95.6  78.0 - 100.0 fl Final   MCHC 08/02/2024 33.5  30.0 - 36.0 g/dL Final   RDW 88/81/7974 14.8  11.5 - 15.5 % Final   Platelets 08/02/2024 89.0 (L)  150.0 - 400.0 K/uL Final   Neutrophils Relative % 08/02/2024 57.9  43.0 - 77.0 % Final   Lymphocytes Relative 08/02/2024 18.5  12.0 - 46.0 % Final   Monocytes Relative 08/02/2024 20.3 Repeated and verified X2. (H)  3.0 - 12.0 % Final   Eosinophils Relative 08/02/2024 2.9  0.0 - 5.0 % Final   Basophils Relative 08/02/2024 0.4  0.0 - 3.0 % Final   Neutro Abs 08/02/2024 2.5  1.4 - 7.7 K/uL Final   Lymphs Abs 08/02/2024 0.8  0.7 - 4.0 K/uL Final   Monocytes Absolute 08/02/2024 0.9  0.1 - 1.0 K/uL Final   Eosinophils Absolute 08/02/2024 0.1  0.0 - 0.7 K/uL Final   Basophils Absolute 08/02/2024 0.0  0.0 - 0.1 K/uL Final   Sodium 08/02/2024 137  135 - 145 mEq/L Final   Potassium 08/02/2024 4.5  3.5 - 5.1 mEq/L Final   Chloride 08/02/2024 101  96 - 112 mEq/L Final   CO2 08/02/2024 28  19 - 32 mEq/L Final   Glucose, Bld 08/02/2024 191 (H)  70 - 99 mg/dL Final   BUN 88/81/7974 15  6 - 23 mg/dL Final   Creatinine, Ser 08/02/2024 1.09  0.40 - 1.50 mg/dL Final   Total Bilirubin 08/02/2024 0.7  0.2 - 1.2 mg/dL Final   Alkaline Phosphatase 08/02/2024 92  39 - 117  U/L Final   AST 08/02/2024 28  0 - 37 U/L Final   ALT 08/02/2024 21  0 - 53 U/L Final   Total Protein 08/02/2024 6.7  6.0 - 8.3 g/dL Final   Albumin 88/81/7974 4.7  3.5 - 5.2 g/dL Final   GFR 88/81/7974 64.88  >60.00 mL/min Final   Calcium  08/02/2024 10.0  8.4 - 10.5 mg/dL Final   Cholesterol 88/81/7974 115  0 - 200 mg/dL Final   Triglycerides 88/81/7974 113.0  0.0 - 149.0 mg/dL Final   HDL 88/81/7974 35.50 (L)  >60.99 mg/dL Final   VLDL 88/81/7974 22.6  0.0 - 40.0 mg/dL Final   LDL Cholesterol 08/02/2024 57  0 - 99 mg/dL Final   Total CHOL/HDL Ratio 08/02/2024 3   Final    NonHDL 08/02/2024 79.12   Final   Direct LDL 08/02/2024 72.0  mg/dL Final   Microalb, Ur 88/81/7974 1.4  0.0 - 1.9 mg/dL Final   Creatinine,U 88/81/7974 23.4  mg/dL Final   Microalb Creat Ratio 08/02/2024 58.2 (H)  0.0 - 30.0 mg/g Final   Color, Urine 08/02/2024 YELLOW  YELLOW Final   APPearance 08/02/2024 CLEAR  CLEAR Final   Specific Gravity, Urine 08/02/2024 1.006  1.001 - 1.035 Final   pH 08/02/2024 6.0  5.0 - 8.0 Final   Glucose, UA 08/02/2024 NEGATIVE  NEGATIVE Final   Bilirubin Urine 08/02/2024 NEGATIVE  NEGATIVE Final   Ketones, ur 08/02/2024 NEGATIVE  NEGATIVE Final   Hgb urine dipstick 08/02/2024 NEGATIVE  NEGATIVE Final   Protein, ur 08/02/2024 NEGATIVE  NEGATIVE Final   Nitrites, Initial 08/02/2024 NEGATIVE  NEGATIVE Final   Leukocyte Esterase 08/02/2024 NEGATIVE  NEGATIVE Final   WBC, UA 08/02/2024 NONE SEEN  0 - 5 /HPF Final   RBC / HPF 08/02/2024 NONE SEEN  0 - 2 /HPF Final   Squamous Epithelial / HPF 08/02/2024 NONE SEEN  < OR = 5 /HPF Final   Bacteria, UA 08/02/2024 NONE SEEN  NONE SEEN /HPF Final   Hyaline Cast 08/02/2024 NONE SEEN  NONE SEEN /LPF Final   Note 08/02/2024    Final   Reflexve Urine Culture 08/02/2024    Final  Appointment on 07/12/2024  Component Date Value Ref Range Status   WBC 07/12/2024 3.2 (L)  4.0 - 10.5 K/uL Final   RBC 07/12/2024 2.66 (L)  4.22 - 5.81 MIL/uL Final   Hemoglobin 07/12/2024 8.9 (L)  13.0 - 17.0 g/dL Final   HCT 89/71/7974 27.1 (L)  39.0 - 52.0 % Final   MCV 07/12/2024 101.9 (H)  80.0 - 100.0 fL Final   MCH 07/12/2024 33.5  26.0 - 34.0 pg Final   MCHC 07/12/2024 32.8  30.0 - 36.0 g/dL Final   RDW 89/71/7974 14.5  11.5 - 15.5 % Final   Platelets 07/12/2024 65 (L)  150 - 400 K/uL Final   nRBC 07/12/2024 0.0  0.0 - 0.2 % Final   Neutrophils Relative % 07/12/2024 54  % Final   Neutro Abs 07/12/2024 1.7  1.7 - 7.7 K/uL Final   Lymphocytes Relative 07/12/2024 17  % Final   Lymphs Abs 07/12/2024 0.6 (L)  0.7 - 4.0 K/uL Final    Monocytes Relative 07/12/2024 26  % Final   Monocytes Absolute 07/12/2024 0.8  0.1 - 1.0 K/uL Final   Eosinophils Relative 07/12/2024 2  % Final   Eosinophils Absolute 07/12/2024 0.1  0.0 - 0.5 K/uL Final   Basophils Relative 07/12/2024 0  % Final   Basophils Absolute 07/12/2024  0.0  0.0 - 0.1 K/uL Final   Immature Granulocytes 07/12/2024 1  % Final   Abs Immature Granulocytes 07/12/2024 0.03  0.00 - 0.07 K/uL Final   Large Granular Lymphocytes 07/12/2024 PRESENT   Final   Reactive, Benign Lymphocytes 07/12/2024 PRESENT   Final   Basophilic Stippling 07/12/2024 PRESENT   Final   Ovalocytes 07/12/2024 PRESENT   Final  Admission on 07/09/2024, Discharged on 07/11/2024  Component Date Value Ref Range Status   WBC 07/09/2024 3.7 (L)  4.0 - 10.5 K/uL Final   RBC 07/09/2024 2.48 (L)  4.22 - 5.81 MIL/uL Final   Hemoglobin 07/09/2024 8.4 (L)  13.0 - 17.0 g/dL Final   HCT 89/74/7974 25.2 (L)  39.0 - 52.0 % Final   MCV 07/09/2024 101.6 (H)  80.0 - 100.0 fL Final   MCH 07/09/2024 33.9  26.0 - 34.0 pg Final   MCHC 07/09/2024 33.3  30.0 - 36.0 g/dL Final   RDW 89/74/7974 14.8  11.5 - 15.5 % Final   Platelets 07/09/2024 76 (L)  150 - 400 K/uL Final   nRBC 07/09/2024 0.0  0.0 - 0.2 % Final   Neutrophils Relative % 07/09/2024 58  % Final   Neutro Abs 07/09/2024 2.2  1.7 - 7.7 K/uL Final   Lymphocytes Relative 07/09/2024 17  % Final   Lymphs Abs 07/09/2024 0.6 (L)  0.7 - 4.0 K/uL Final   Monocytes Relative 07/09/2024 21  % Final   Monocytes Absolute 07/09/2024 0.8  0.1 - 1.0 K/uL Final   Eosinophils Relative 07/09/2024 2  % Final   Eosinophils Absolute 07/09/2024 0.1  0.0 - 0.5 K/uL Final   Basophils Relative 07/09/2024 0  % Final   Basophils Absolute 07/09/2024 0.0  0.0 - 0.1 K/uL Final   WBC Morphology 07/09/2024 MORPHOLOGY UNREMARKABLE   Final   Smear Review 07/09/2024 See Note   Final   Immature Granulocytes 07/09/2024 2  % Final   Abs Immature Granulocytes 07/09/2024 0.06  0.00 - 0.07 K/uL  Final   Ovalocytes 07/09/2024 PRESENT   Final   Sodium 07/09/2024 134 (L)  135 - 145 mmol/L Final   Potassium 07/09/2024 5.2 (H)  3.5 - 5.1 mmol/L Final   Chloride 07/09/2024 101  98 - 111 mmol/L Final   CO2 07/09/2024 20 (L)  22 - 32 mmol/L Final   Glucose, Bld 07/09/2024 280 (H)  70 - 99 mg/dL Final   BUN 89/74/7974 26 (H)  8 - 23 mg/dL Final   Creatinine, Ser 07/09/2024 1.56 (H)  0.61 - 1.24 mg/dL Final   Calcium  07/09/2024 8.7 (L)  8.9 - 10.3 mg/dL Final   Total Protein 89/74/7974 6.0 (L)  6.5 - 8.1 g/dL Final   Albumin 89/74/7974 4.2  3.5 - 5.0 g/dL Final   AST 89/74/7974 26  15 - 41 U/L Final   ALT 07/09/2024 19  0 - 44 U/L Final   Alkaline Phosphatase 07/09/2024 78  38 - 126 U/L Final   Total Bilirubin 07/09/2024 0.6  0.0 - 1.2 mg/dL Final   GFR, Estimated 07/09/2024 45 (L)  >60 mL/min Final   Anion gap 07/09/2024 13  5 - 15 Final   Color, Urine 07/09/2024 YELLOW  YELLOW Final   APPearance 07/09/2024 CLEAR  CLEAR Final   Specific Gravity, Urine 07/09/2024 1.013  1.005 - 1.030 Final   pH 07/09/2024 6.0  5.0 - 8.0 Final   Glucose, UA 07/09/2024 150 (A)  NEGATIVE mg/dL Final   Hgb urine dipstick 07/09/2024 NEGATIVE  NEGATIVE Final   Bilirubin Urine 07/09/2024 NEGATIVE  NEGATIVE Final   Ketones, ur 07/09/2024 NEGATIVE  NEGATIVE mg/dL Final   Protein, ur 89/74/7974 100 (A)  NEGATIVE mg/dL Final   Nitrite 89/74/7974 NEGATIVE  NEGATIVE Final   Leukocytes,Ua 07/09/2024 NEGATIVE  NEGATIVE Final   RBC / HPF 07/09/2024 0-5  0 - 5 RBC/hpf Final   WBC, UA 07/09/2024 0-5  0 - 5 WBC/hpf Final   Bacteria, UA 07/09/2024 NONE SEEN  NONE SEEN Final   Squamous Epithelial / HPF 07/09/2024 0-5  0 - 5 /HPF Final   ABO/RH(D) 07/09/2024 O POS   Final   Antibody Screen 07/09/2024 NEG   Final   Sample Expiration 07/09/2024    Final                   Value:07/12/2024,2359 Performed at [facility], [address] [place], [place] [address]    Troponin T High Sensitivity 07/09/2024 <15  0 - 19 ng/L  Final   TSH 07/09/2024 3.870  0.350 - 4.500 uIU/mL Final   D-Dimer, Quant 07/09/2024 0.55 (H)  0.00 - 0.50 ug/mL-FEU Final   Lactic Acid, Venous 07/09/2024 1.4  0.5 - 1.9 mmol/L Final   Lactic Acid, Venous 07/09/2024 1.3  0.5 - 1.9 mmol/L Final   Troponin T High Sensitivity 07/09/2024 <15  0 - 19 ng/L Final   Cortisol, Plasma 07/09/2024 25.1  ug/dL Final   Weight 89/73/7974 3,012.37  oz Final   Height 07/10/2024 71  in Final   BP 07/10/2024 141/68  mmHg Final   Area-P 1/2 07/10/2024 3.12  cm2 Final   S' Lateral 07/10/2024 2.40  cm Final   Est EF 07/10/2024 60 - 65%   Final   Sodium 07/10/2024 138  135 - 145 mmol/L Final   Potassium 07/10/2024 4.3  3.5 - 5.1 mmol/L Final   Chloride 07/10/2024 106  98 - 111 mmol/L Final   CO2 07/10/2024 22  22 - 32 mmol/L Final   Glucose, Bld 07/10/2024 145 (H)  70 - 99 mg/dL Final   BUN 89/73/7974 20  8 - 23 mg/dL Final   Creatinine, Ser 07/10/2024 1.17  0.61 - 1.24 mg/dL Final   Calcium  07/10/2024 8.7 (L)  8.9 - 10.3 mg/dL Final   GFR, Estimated 07/10/2024 >60  >60 mL/min Final   Anion gap 07/10/2024 10  5 - 15 Final   WBC 07/10/2024 2.4 (L)  4.0 - 10.5 K/uL Final   RBC 07/10/2024 2.15 (L)  4.22 - 5.81 MIL/uL Final   Hemoglobin 07/10/2024 7.1 (L)  13.0 - 17.0 g/dL Final   HCT 89/73/7974 21.7 (L)  39.0 - 52.0 % Final   MCV 07/10/2024 100.9 (H)  80.0 - 100.0 fL Final   MCH 07/10/2024 33.0  26.0 - 34.0 pg Final   MCHC 07/10/2024 32.7  30.0 - 36.0 g/dL Final   RDW 89/73/7974 14.6  11.5 - 15.5 % Final   Platelets 07/10/2024 54 (L)  150 - 400 K/uL Final   nRBC 07/10/2024 0.0  0.0 - 0.2 % Final   Hgb A1c MFr Bld 07/10/2024 4.8  4.8 - 5.6 % Final   Mean Plasma Glucose 07/10/2024 91.06  mg/dL Final   Glucose-Capillary 07/09/2024 204 (H)  70 - 99 mg/dL Final   Glucose-Capillary 07/10/2024 130 (H)  70 - 99 mg/dL Final   Glucose-Capillary 07/10/2024 190 (H)  70 - 99 mg/dL Final   Glucose-Capillary 07/10/2024 146 (H)  70 - 99 mg/dL Final  Sodium 07/11/2024  140  135 - 145 mmol/L Final   Potassium 07/11/2024 4.3  3.5 - 5.1 mmol/L Final   Chloride 07/11/2024 106  98 - 111 mmol/L Final   CO2 07/11/2024 25  22 - 32 mmol/L Final   Glucose, Bld 07/11/2024 146 (H)  70 - 99 mg/dL Final   BUN 89/72/7974 16  8 - 23 mg/dL Final   Creatinine, Ser 07/11/2024 1.08  0.61 - 1.24 mg/dL Final   Calcium  07/11/2024 8.9  8.9 - 10.3 mg/dL Final   GFR, Estimated 07/11/2024 >60  >60 mL/min Final   Anion gap 07/11/2024 10  5 - 15 Final   WBC 07/11/2024 2.5 (L)  4.0 - 10.5 K/uL Final   RBC 07/11/2024 2.44 (L)  4.22 - 5.81 MIL/uL Final   Hemoglobin 07/11/2024 8.0 (L)  13.0 - 17.0 g/dL Final   HCT 89/72/7974 24.7 (L)  39.0 - 52.0 % Final   MCV 07/11/2024 101.2 (H)  80.0 - 100.0 fL Final   MCH 07/11/2024 32.8  26.0 - 34.0 pg Final   MCHC 07/11/2024 32.4  30.0 - 36.0 g/dL Final   RDW 89/72/7974 14.6  11.5 - 15.5 % Final   Platelets 07/11/2024 56 (L)  150 - 400 K/uL Final   nRBC 07/11/2024 0.0  0.0 - 0.2 % Final   Magnesium 07/11/2024 1.9  1.7 - 2.4 mg/dL Final   Glucose-Capillary 07/10/2024 144 (H)  70 - 99 mg/dL Final   Comment 1 89/73/7974 Notify RN   Final   Comment 2 07/10/2024 Document in Chart   Final   Glucose-Capillary 07/11/2024 151 (H)  70 - 99 mg/dL Final   Comment 1 89/72/7974 Document in Chart   Final   Glucose-Capillary 07/11/2024 209 (H)  70 - 99 mg/dL Final   Comment 1 89/72/7974 Notify RN   Final   Comment 2 07/11/2024 Document in Chart   Final  Appointment on 06/29/2024  Component Date Value Ref Range Status   WBC 06/29/2024 3.3 (L)  4.0 - 10.5 K/uL Final   RBC 06/29/2024 2.66 (L)  4.22 - 5.81 MIL/uL Final   Hemoglobin 06/29/2024 8.9 (L)  13.0 - 17.0 g/dL Final   HCT 89/84/7974 26.9 (L)  39.0 - 52.0 % Final   MCV 06/29/2024 101.1 (H)  80.0 - 100.0 fL Final   MCH 06/29/2024 33.5  26.0 - 34.0 pg Final   MCHC 06/29/2024 33.1  30.0 - 36.0 g/dL Final   RDW 89/84/7974 14.7  11.5 - 15.5 % Final   Platelets 06/29/2024 70 (L)  150 - 400 K/uL Final    nRBC 06/29/2024 0.0  0.0 - 0.2 % Final   Neutrophils Relative % 06/29/2024 56  % Final   Neutro Abs 06/29/2024 1.8  1.7 - 7.7 K/uL Final   Lymphocytes Relative 06/29/2024 17  % Final   Lymphs Abs 06/29/2024 0.5 (L)  0.7 - 4.0 K/uL Final   Monocytes Relative 06/29/2024 24  % Final   Monocytes Absolute 06/29/2024 0.8  0.1 - 1.0 K/uL Final   Eosinophils Relative 06/29/2024 2  % Final   Eosinophils Absolute 06/29/2024 0.1  0.0 - 0.5 K/uL Final   Basophils Relative 06/29/2024 0  % Final   Basophils Absolute 06/29/2024 0.0  0.0 - 0.1 K/uL Final   WBC Morphology 06/29/2024 MORPHOLOGY UNREMARKABLE   Final   RBC Morphology 06/29/2024 MORPHOLOGY UNREMARKABLE   Final   Immature Granulocytes 06/29/2024 1  % Final   Abs Immature Granulocytes 06/29/2024 0.04  0.00 -  0.07 K/uL Final   Sodium 06/29/2024 136  135 - 145 mmol/L Final   Potassium 06/29/2024 5.1  3.5 - 5.1 mmol/L Final   Chloride 06/29/2024 102  98 - 111 mmol/L Final   CO2 06/29/2024 25  22 - 32 mmol/L Final   Glucose, Bld 06/29/2024 200 (H)  70 - 99 mg/dL Final   BUN 89/84/7974 19  8 - 23 mg/dL Final   Creatinine, Ser 06/29/2024 1.33 (H)  0.61 - 1.24 mg/dL Final   Calcium  06/29/2024 9.6  8.9 - 10.3 mg/dL Final   Total Protein 89/84/7974 6.8  6.5 - 8.1 g/dL Final   Albumin 89/84/7974 4.6  3.5 - 5.0 g/dL Final   AST 89/84/7974 29  15 - 41 U/L Final   ALT 06/29/2024 21  0 - 44 U/L Final   Alkaline Phosphatase 06/29/2024 86  38 - 126 U/L Final   Total Bilirubin 06/29/2024 0.5  0.0 - 1.2 mg/dL Final   GFR, Estimated 06/29/2024 55 (L)  >60 mL/min Final   Anion gap 06/29/2024 10  5 - 15 Final   Ferritin 06/29/2024 45  24 - 336 ng/mL Final   Iron 06/29/2024 171  45 - 182 ug/dL Final   TIBC 89/84/7974 431  250 - 450 ug/dL Final   Saturation Ratios 06/29/2024 40 (H)  17.9 - 39.5 % Final   UIBC 06/29/2024 260  ug/dL Final   LDH 89/84/7974 198 (H)  98 - 192 U/L Final   Erythropoietin  06/29/2024 62.2 (H)  2.6 - 18.5 mIU/mL Final  Orders Only on  06/21/2024  Component Date Value Ref Range Status   WBC 06/21/2024 3.2 (L)  3.4 - 10.8 x10E3/uL Final   RBC 06/21/2024 2.72 (LL)  4.14 - 5.80 x10E6/uL Final   Hemoglobin 06/21/2024 8.9 (L)  13.0 - 17.7 g/dL Final   Hematocrit 89/92/7974 27.6 (L)  37.5 - 51.0 % Final   MCV 06/21/2024 102 (H)  79 - 97 fL Final   MCH 06/21/2024 32.7  26.6 - 33.0 pg Final   MCHC 06/21/2024 32.2  31.5 - 35.7 g/dL Final   RDW 89/92/7974 13.5  11.6 - 15.4 % Final   Platelets 06/21/2024 79 (LL)  150 - 450 x10E3/uL Final   Neutrophils 06/21/2024 49  Not Estab. % Final   Lymphs 06/21/2024 23  Not Estab. % Final   Monocytes 06/21/2024 25  Not Estab. % Final   Eos 06/21/2024 3  Not Estab. % Final   Basos 06/21/2024 0  Not Estab. % Final   Neutrophils Absolute 06/21/2024 1.5  1.4 - 7.0 x10E3/uL Final   Lymphocytes Absolute 06/21/2024 0.7  0.7 - 3.1 x10E3/uL Final   Monocytes Absolute 06/21/2024 0.8  0.1 - 0.9 x10E3/uL Final   EOS (ABSOLUTE) 06/21/2024 0.1  0.0 - 0.4 x10E3/uL Final   Basophils Absolute 06/21/2024 0.0  0.0 - 0.2 x10E3/uL Final   Immature Granulocytes 06/21/2024 0  Not Estab. % Final   Immature Grans (Abs) 06/21/2024 0.0  0.0 - 0.1 x10E3/uL Final   Hematology Comments: 06/21/2024 Note:   Final   Glucose 06/21/2024 90  70 - 99 mg/dL Final   BUN 89/92/7974 19  8 - 27 mg/dL Final   Creatinine, Ser 06/21/2024 1.28 (H)  0.76 - 1.27 mg/dL Final   eGFR 89/92/7974 57 (L)  >59 mL/min/1.73 Final   BUN/Creatinine Ratio 06/21/2024 15  10 - 24 Final   Sodium 06/21/2024 140  134 - 144 mmol/L Final   Potassium 06/21/2024 4.7  3.5 -  5.2 mmol/L Final   Chloride 06/21/2024 105  96 - 106 mmol/L Final   CO2 06/21/2024 22  20 - 29 mmol/L Final   Calcium  06/21/2024 9.7  8.6 - 10.2 mg/dL Final   Total Protein 89/92/7974 6.3  6.0 - 8.5 g/dL Final   Albumin 89/92/7974 4.4  3.8 - 4.8 g/dL Final   Globulin, Total 06/21/2024 1.9  1.5 - 4.5 g/dL Final   Bilirubin Total 06/21/2024 0.5  0.0 - 1.2 mg/dL Final   Alkaline  Phosphatase 06/21/2024 84  47 - 123 IU/L Final   AST 06/21/2024 26  0 - 40 IU/L Final   ALT 06/21/2024 17  0 - 44 IU/L Final   INR 06/21/2024 1.0  0.9 - 1.2 Final   Prothrombin Time 06/21/2024 10.9  9.1 - 12.0 sec Final   AFP, Serum, Tumor Marker 06/21/2024 <1.8  0.0 - 8.4 ng/mL Final   specimen status report 06/21/2024 Comment   Final  Appointment on 05/04/2024  Component Date Value Ref Range Status   Ferritin 05/04/2024 76  24 - 336 ng/mL Final   [facility] test code 05/04/2024 [id number]   Final   [facility] test name 05/04/2024 myeloid ngs   Final   Misc [facility] result 05/04/2024 COMMENT   Final   WBC 05/04/2024 2.6 (L)  4.0 - 10.5 K/uL Final   RBC 05/04/2024 2.73 (L)  4.22 - 5.81 MIL/uL Final   Hemoglobin 05/04/2024 9.1 (L)  13.0 - 17.0 g/dL Final   HCT 91/79/7974 27.4 (L)  39.0 - 52.0 % Final   MCV 05/04/2024 100.4 (H)  80.0 - 100.0 fL Final   MCH 05/04/2024 33.3  26.0 - 34.0 pg Final   MCHC 05/04/2024 33.2  30.0 - 36.0 g/dL Final   RDW 91/79/7974 14.5  11.5 - 15.5 % Final   Platelets 05/04/2024 61 (L)  150 - 400 K/uL Final   nRBC 05/04/2024 0.0  0.0 - 0.2 % Final   Neutrophils Relative % 05/04/2024 54  % Final   Neutro Abs 05/04/2024 1.4 (L)  1.7 - 7.7 K/uL Final   Lymphocytes Relative 05/04/2024 21  % Final   Lymphs Abs 05/04/2024 0.6 (L)  0.7 - 4.0 K/uL Final   Monocytes Relative 05/04/2024 22  % Final   Monocytes Absolute 05/04/2024 0.6  0.1 - 1.0 K/uL Final   Eosinophils Relative 05/04/2024 2  % Final   Eosinophils Absolute 05/04/2024 0.1  0.0 - 0.5 K/uL Final   Basophils Relative 05/04/2024 0  % Final   Basophils Absolute 05/04/2024 0.0  0.0 - 0.1 K/uL Final   WBC Morphology 05/04/2024 MORPHOLOGY UNREMARKABLE   Final   RBC Morphology 05/04/2024 MORPHOLOGY UNREMARKABLE   Final   Smear Review 05/04/2024 See Note   Final   Immature Granulocytes 05/04/2024 1  % Final   Abs Immature Granulocytes 05/04/2024 0.02  0.00 - 0.07 K/uL Final   Iron 05/04/2024 82  45 - 182 ug/dL Final    TIBC 91/79/7974 352  250 - 450 ug/dL Final   Saturation Ratios 05/04/2024 23  17.9 - 39.5 % Final   UIBC 05/04/2024 270  ug/dL Final  There may be more visits with results that are not included.  No image results found. US  ABDOMEN LIMITED RUQ (LIVER/GB) Result Date: 07/28/2024 CLINICAL DATA:  Cirrhosis EXAM: ULTRASOUND ABDOMEN LIMITED RIGHT UPPER QUADRANT COMPARISON:  01/26/2024 FINDINGS: Gallbladder: Status Post Cholecystectomy Common bile duct: Diameter: 7 mm Liver: Parenchymal echogenicity: Mildly coarsened Contours: Nodular Lesions: None Portal vein: Patent.  Hepatopetal  flow Other: Recanalized periumbilical vein again seen consistent with portal hypertension. IMPRESSION: Cirrhotic liver morphology without focal hepatic lesion. Electronically Signed   By: Aliene Lloyd M.D.   On: 07/28/2024 09:04   ECHOCARDIOGRAM COMPLETE Result Date: 07/10/2024    ECHOCARDIOGRAM REPORT   Patient Name:   NOVAH GOZA Date of Exam: 07/10/2024 Medical Rec #:  [id number]     Height:       71.0 in Accession #:    [id number]    Weight:       188.3 lb Date of Birth:  [birth year]      BSA:          2.055 m Patient Age:    78 years      BP:           139/68 mmHg Patient Gender: M             HR:           81 bpm. Exam Location:  [facility] Procedure: 2D Echo, Cardiac Doppler and Color Doppler (Both Spectral and Color            Flow Doppler were utilized during procedure). Indications:    Syncope R55  History:        Patient has prior history of Echocardiogram examinations, most                 recent 01/25/2021. Arrythmias:Atrial Fibrillation,                 Signs/Symptoms:Syncope; Risk Factors:Hypertension, Diabetes and                 Dyslipidemia.  Sonographer:    Aida Pizza RCS Referring Phys: [id number] DAWOOD S ELGERGAWY IMPRESSIONS  1. Left ventricular ejection fraction, by estimation, is 60 to 65%. The left ventricle has normal function. The left ventricle has no regional wall motion abnormalities. There is mild concentric  left ventricular hypertrophy. Indeterminate diastolic filling due to E-A fusion.  2. Right ventricular systolic function is normal. The right ventricular size is normal. There is normal pulmonary artery systolic pressure. The estimated right ventricular systolic pressure is 31.5 mmHg.  3. The mitral valve is grossly normal. Mild mitral valve regurgitation. No evidence of mitral stenosis.  4. The aortic valve is tricuspid. Aortic valve regurgitation is not visualized. No aortic stenosis is present.  5. The inferior vena cava is normal in size with greater than 50% respiratory variability, suggesting right atrial pressure of 3 mmHg. Comparison(s): No significant change from prior study. FINDINGS  Left Ventricle: Left ventricular ejection fraction, by estimation, is 60 to 65%. The left ventricle has normal function. The left ventricle has no regional wall motion abnormalities. The left ventricular internal cavity size was normal in size. There is  mild concentric left ventricular hypertrophy. Indeterminate diastolic filling due to E-A fusion. Right Ventricle: The right ventricular size is normal. No increase in right ventricular wall thickness. Right ventricular systolic function is normal. There is normal pulmonary artery systolic pressure. The tricuspid regurgitant velocity is 2.67 m/s, and  with an assumed right atrial pressure of 3 mmHg, the estimated right ventricular systolic pressure is 31.5 mmHg. Left Atrium: Left atrial size was normal in size. Right Atrium: Right atrial size was normal in size. Pericardium: There is no evidence of pericardial effusion. Mitral Valve: The mitral valve is grossly normal. Mild mitral valve regurgitation. No evidence of mitral valve stenosis. Tricuspid Valve: The tricuspid valve is grossly normal. Tricuspid valve regurgitation  is mild . No evidence of tricuspid stenosis. Aortic Valve: The aortic valve is tricuspid. Aortic valve regurgitation is not visualized. No aortic stenosis  is present. Pulmonic Valve: The pulmonic valve was grossly normal. Pulmonic valve regurgitation is not visualized. No evidence of pulmonic stenosis. Aorta: The aortic root is normal in size and structure. Venous: The inferior vena cava is normal in size with greater than 50% respiratory variability, suggesting right atrial pressure of 3 mmHg. IAS/Shunts: The atrial septum is grossly normal.  LEFT VENTRICLE PLAX 2D LVIDd:         4.70 cm   Diastology LVIDs:         2.40 cm   LV e' lateral:   8.08 cm/s LV PW:         1.20 cm   LV E/e' lateral: 13.9 LV IVS:        1.10 cm LVOT diam:     2.20 cm LV SV:         103 LV SV Index:   50 LVOT Area:     3.80 cm  RIGHT VENTRICLE RV S prime:     15.90 cm/s TAPSE (M-mode): 3.2 cm LEFT ATRIUM           Index        RIGHT ATRIUM           Index LA diam:      3.90 cm 1.90 cm/m   RA Area:     22.70 cm LA Vol (A2C): 74.7 ml 36.35 ml/m  RA Volume:   71.50 ml  34.79 ml/m LA Vol (A4C): 70.2 ml 34.16 ml/m  AORTIC VALVE LVOT Vmax:   116.00 cm/s LVOT Vmean:  85.600 cm/s LVOT VTI:    0.270 m  AORTA Ao Root diam: 3.30 cm MITRAL VALVE                TRICUSPID VALVE MV Area (PHT): 3.12 cm     TR Peak grad:   28.5 mmHg MV Decel Time: 243 msec     TR Vmax:        267.00 cm/s MV E velocity: 112.00 cm/s MV A velocity: 134.00 cm/s  SHUNTS MV E/A ratio:  0.84         Systemic VTI:  0.27 m                             Systemic Diam: 2.20 cm Darryle Decent MD Electronically signed by Darryle Decent MD Signature Date/Time: 07/10/2024/12:08:00 PM    Final    MR BRAIN WO CONTRAST Result Date: 07/09/2024 EXAM: MRI BRAIN WITHOUT CONTRAST 07/09/2024 07:08:25 PM TECHNIQUE: Multiplanar multisequence MRI of the head/brain was performed without the administration of intravenous contrast. COMPARISON: CT head 07/09/2024. CLINICAL HISTORY: AMS, Confusion. Pt BIB EMS from a syncopal episode. FINDINGS: BRAIN AND VENTRICLES: No acute infarct. No intracranial hemorrhage. No mass. No midline shift. No  hydrocephalus. The sella is unremarkable. Normal flow voids. ORBITS: No acute abnormality. SINUSES AND MASTOIDS: No acute abnormality. BONES AND SOFT TISSUES: Normal marrow signal. No acute soft tissue abnormality. IMPRESSION: 1. No acute intracranial abnormality. Electronically signed by: Gilmore Molt MD 07/09/2024 07:19 PM EDT RP Workstation: [id number]   DG Chest Portable 1 View Result Date: 07/09/2024 CLINICAL DATA:  Syncopal episode. EXAM: PORTABLE CHEST 1 VIEW COMPARISON:  March 13, 2022 FINDINGS: The cardiac silhouette is mildly enlarged. Low lung volumes are noted. No acute infiltrate, pleural effusion or pneumothorax  is identified. Radiopaque surgical clips are seen overlying the right upper quadrant. The visualized skeletal structures are unremarkable. IMPRESSION: No active disease. Electronically Signed   By: Suzen Dials M.D.   On: 07/09/2024 16:48   CT Head Wo Contrast Result Date: 07/09/2024 CLINICAL DATA:  Status post trauma. EXAM: CT HEAD WITHOUT CONTRAST TECHNIQUE: Contiguous axial images were obtained from the base of the skull through the vertex without intravenous contrast. RADIATION DOSE REDUCTION: This exam was performed according to the departmental dose-optimization program which includes automated exposure control, adjustment of the mA and/or kV according to patient size and/or use of iterative reconstruction technique. COMPARISON:  June 19, 2023 FINDINGS: Brain: No evidence of acute infarction, hemorrhage, hydrocephalus, extra-axial collection or mass lesion/mass effect. Vascular: No hyperdense vessel or unexpected calcification. Skull: Normal. Negative for fracture or focal lesion. Sinuses/Orbits: No acute finding. Other: None. IMPRESSION: No acute intracranial pathology. Electronically Signed   By: Suzen Dials M.D.   On: 07/09/2024 16:46       ASSESSMENT & PLAN   Assessment & Plan Liver cirrhosis secondary to NASH [facility]) He has liver cirrhosis secondary to  NASH with a history of gastrointestinal bleeding, though current liver function appears stable. He should avoid alcohol and acetaminophen  due to liver sensitivity. We discussed the impact of elevated A1c on liver health and the potential benefits of Trulicity  for liver protection. Avoid alcohol and acetaminophen , monitor for signs of gastrointestinal bleeding, and consider Trulicity  for liver protection if affordable. Type 2 diabetes mellitus with hyperlipidemia (HCC) His recent A1c is 7.7, indicating suboptimal control compared to previous levels of 4.8 and 5.2. He is currently on metformin  and glimepiride , with occasional blood glucose readings up to 200 mg/dL. We discussed Trulicity  for liver protection and cardiovascular benefits, but he prefers to avoid injectables due to cost and past adverse effects with Ozempic . It is crucial to maintain A1c under 7 to prevent liver complications. Continue metformin  [phone] mg orally twice daily and adjust glimepiride  to 2 mg in the morning and 2 mg at night. Trulicity  is prescribed as an option, but he may choose not to use it due to cost. A follow-up is scheduled in 3 months to reassess blood glucose control. PAF (paroxysmal atrial fibrillation) (HCC) He has a history of syncope and is currently not on anticoagulation due to bleeding risk and history heavy bleeding. He reports occasional irregular heartbeats but no recent syncope. No syncopal symptom(s) since discontinued diltiazem  Onychomycosis He has moderate onychomycosis with fungal infection on toenails and heel. Oral antifungal treatment is not recommended due to his liver condition. He prefers topical treatment over referral to a foot specialist due to a previous negative experience. Prescribe antifungal cream for topical application and advise wearing breathable footwear to prevent fungal growth.  ORDER ASSOCIATIONS  #   DIAGNOSIS / CONDITION ICD-10 ENCOUNTER ORDER     ICD-10-CM   1. Liver cirrhosis  secondary to NASH (HCC)  K75.81 Dulaglutide  (TRULICITY ) 0.75 MG/0.5ML SOAJ   K74.69     2. Type 2 diabetes mellitus with hyperlipidemia (HCC)  E11.69 metFORMIN  (GLUCOPHAGE ) 500 MG tablet   E78.5 POCT HgB A1C    glimepiride  (AMARYL ) 4 MG tablet    Dulaglutide  (TRULICITY ) 0.75 MG/0.5ML SOAJ    3. PAF (paroxysmal atrial fibrillation) (HCC)  I48.0     4. Onychomycosis  B35.1 ciclopirox  (PENLAC ) 8 % solution           Orders Placed in Encounter:   Lab Orders  POCT HgB A1C    Meds ordered this encounter  Medications   metFORMIN  (GLUCOPHAGE ) 500 MG tablet    Sig: Take 1-2 tablets (500-1,000 mg total) by mouth 2 (two) times daily. Taking 2 at night and 1 in the morning    Dispense:  360 tablet    Refill:  2   glimepiride  (AMARYL ) 4 MG tablet    Sig: Take 0.5-1 tablets (2-4 mg total) by mouth daily.    Dispense:  90 tablet    Refill:  3   Dulaglutide  (TRULICITY ) 0.75 MG/0.5ML SOAJ    Sig: Inject 0.75 mg into the skin once a week. Only take if affordable, and metformin /glimepiride  not controlling sugar.    Dispense:  2 mL    Refill:  1   ciclopirox  (PENLAC ) 8 % solution    Sig: Apply topically at bedtime. Apply over nail and surrounding skin. Apply daily over previous coat. After seven (7) days, may remove with alcohol and continue cycle.    Dispense:  6.6 mL    Refill:  0   recording duration: 33 minutes.     This document was synthesized by artificial intelligence ([employer]) using HIPAA-compliant recording of the clinical interaction;   We discussed the use of AI scribe software for clinical note transcription with the patient, who gave verbal consent to proceed. additional Info: This encounter employed state-of-the-art, real-time, collaborative documentation. The patient actively reviewed and assisted in updating their electronic medical record on a shared screen, ensuring transparency and facilitating joint problem-solving for the problem list, overview, and plan. This  approach promotes accurate, informed care. The treatment plan was discussed and reviewed in detail, including medication safety, potential side effects, and all patient questions. We confirmed understanding and comfort with the plan. Follow-up instructions were established, including contacting the office for any concerns, returning if symptoms worsen, persist, or new symptoms develop, and precautions for potential emergency department visits.

## 2024-10-04 NOTE — Assessment & Plan Note (Signed)
 His recent A1c is 7.7, indicating suboptimal control compared to previous levels of 4.8 and 5.2. He is currently on metformin  and glimepiride , with occasional blood glucose readings up to 200 mg/dL. We discussed Trulicity  for liver protection and cardiovascular benefits, but he prefers to avoid injectables due to cost and past adverse effects with Ozempic . It is crucial to maintain A1c under 7 to prevent liver complications. Continue metformin  [phone] mg orally twice daily and adjust glimepiride  to 2 mg in the morning and 2 mg at night. Trulicity  is prescribed as an option, but he may choose not to use it due to cost. A follow-up is scheduled in 3 months to reassess blood glucose control.

## 2024-10-04 NOTE — Patient Instructions (Addendum)
 It was a pleasure seeing you today! Your health and satisfaction are our top priorities.  Derek Cone, MD  VISIT SUMMARY: During your follow-up visit, we discussed the management of your diabetes, liver cirrhosis, atrial fibrillation, and fungal infection. You reported feeling better overall, with improvements in your blood sugar levels and anemia symptoms. We reviewed your current medications and made some adjustments to optimize your treatment plan.  YOUR PLAN: -TYPE 2 DIABETES MELLITUS: Type 2 diabetes is a condition where your body does not use insulin  properly, leading to high blood sugar levels. Your recent A1c level is 7.7, indicating that your blood sugar control needs improvement. Continue taking metformin  [phone] mg twice daily and adjust glimepiride  to 2 mg in the morning and 2 mg at night. We discussed Trulicity  for additional benefits, but it is optional due to cost. We will reassess your blood sugar control in 3 months.  -LIVER CIRRHOSIS SECONDARY TO NASH: Liver cirrhosis is severe scarring of the liver caused by long-term liver damage, in your case due to non-alcoholic steatohepatitis (NASH). Your liver function appears stable, but it is important to avoid alcohol and acetaminophen . Elevated A1c can impact liver health, so maintaining good blood sugar control is crucial. Consider Trulicity  for liver protection if it is affordable for you.  -PAROXYSMAL ATRIAL FIBRILLATION: Paroxysmal atrial fibrillation is a condition where you experience occasional irregular heartbeats. You are not on blood thinners due to the risk of bleeding. Continue monitoring for any irregular heartbeats and report any new symptoms.  -ONYCHOMYCOSIS: Onychomycosis is a fungal infection of the toenails. Due to your liver condition, oral antifungal treatments are not recommended. Continue using the prescribed antifungal cream and wear breathable footwear to prevent fungal growth.  INSTRUCTIONS: Follow up in 3  months to reassess blood glucose control. Continue monitoring your blood sugar levels at home and report any significant changes. Avoid alcohol and acetaminophen , and monitor for signs of gastrointestinal bleeding. Continue using antifungal cream for your toenail infection and wear breathable footwear.  Your Providers PCP: [name] MATSU, MD,  [phone]) Referring Provider: Cone Derek MATSU, MD,  [phone]) Care Team Provider: Debera Jayson MATSU, MD,  [phone]) Care Team Provider: Shaaron Lamar HERO, MD,  [phone]) Care Team Provider: Johnson Laymon HERO DEVONNA,  [phone]) Care Team Provider: Vicci Mcardle, [place],  [phone]) Care Team Provider: Davonna Siad, MD,  [phone]) Care Team Provider: Kennedy Charmaine CROME, NP,  [phone]) Care Team Provider: Geofm Delon BRAVO, NP,  [phone])  NEXT STEPS: [x]  Early Intervention: Schedule sooner appointment, call our on-call services, or go to emergency room if there is any significant Increase in pain or discomfort New or worsening symptoms Sudden or severe changes in your health [x]  Flexible Follow-Up: We recommend a No follow-ups on file. for optimal routine care. This allows for progress monitoring and treatment adjustments. [x]  Preventive Care: Schedule your annual preventive care visit! It's typically covered by insurance and helps identify potential health issues early. [x]  Lab & X-ray Appointments: Incomplete tests scheduled today, or call to schedule. X-rays: [facility] (M-F, 8:30am-noon or 1pm-5pm). [x]  Medical Information Release: Sign a release form at front desk to obtain relevant medical information we don't have.  MAKING THE MOST OF OUR FOCUSED 20 MINUTE APPOINTMENTS: [x]   Clearly state your top concerns at the beginning of the visit to focus our discussion [x]   If you anticipate you will need more time, please inform the front desk during scheduling - we can book multiple  appointments in the same  week. [x]   If you have transportation problems- use our convenient video appointments or ask about transportation support. [x]   We can get down to business faster if you use MyChart to update information before the visit and submit non-urgent questions before your visit. Thank you for taking the time to provide details through MyChart.  Let our nurse know and she can import this information into your encounter documents.  Arrival and Wait Times: [x]   Arriving on time ensures that everyone receives prompt attention. [x]   Early morning (8a) and afternoon (1p) appointments tend to have shortest wait times. [x]   Unfortunately, we cannot delay appointments for late arrivals or hold slots during phone calls.  Getting Answers and Following Up [x]   Simple Questions & Concerns: For quick questions or basic follow-up after your visit, reach us  at [phone] or MyChart messaging. [x]   Complex Concerns: If your concern is more complex, scheduling an appointment might be best. Discuss this with the staff to find the most suitable option. [x]   Lab & Imaging Results: We'll contact you directly if results are abnormal or you don't use MyChart. Most normal results will be on MyChart within 2-3 business days, with a review message from Dr. Jesus. Haven't heard back in 2 weeks? Need results sooner? Contact us  at [phone]. [x]   Referrals: Our referral coordinator will manage specialist referrals. The specialist's office should contact you within 2 weeks to schedule an appointment. Call us  if you haven't heard from them after 2 weeks.  Staying Connected [x]   MyChart: Activate your MyChart for the fastest way to access results and message us . See the last page of this paperwork for instructions on how to activate.  Bring to Your Next Appointment [x]   Medications: Please bring all your medication bottles to your next appointment to ensure we have an accurate record of your  prescriptions. [x]   Health Diaries: If you're monitoring any health conditions at home, keeping a diary of your readings can be very helpful for discussions at your next appointment.  Billing [x]   X-ray & Lab Orders: These are billed by separate companies. Contact the invoicing company directly for questions or concerns. [x]   Visit Charges: Discuss any billing inquiries with our administrative services team.  Your Satisfaction Matters [x]   Share Your Experience: We strive for your satisfaction! If you have any complaints, or preferably compliments, please let Dr. Jesus know directly or contact our Practice Administrators, Manuelita Rubin or [employer], by asking at the front desk.   Reviewing Your Records [x]   Review this early draft of your clinical encounter notes below and the final encounter summary tomorrow on MyChart after its been completed.  All orders placed so far are visible here: Liver cirrhosis secondary to NASH [facility]) Assessment & Plan: He has liver cirrhosis secondary to NASH with a history of gastrointestinal bleeding, though current liver function appears stable. He should avoid alcohol and acetaminophen  due to liver sensitivity. We discussed the impact of elevated A1c on liver health and the potential benefits of Trulicity  for liver protection. Avoid alcohol and acetaminophen , monitor for signs of gastrointestinal bleeding, and consider Trulicity  for liver protection if affordable.  Orders: -     Trulicity ; Inject 0.75 mg into the skin once a week. Only take if affordable, and metformin /glimepiride  not controlling sugar.  Dispense: 2 mL; Refill: 1  Type 2 diabetes mellitus with hyperlipidemia (HCC) Assessment & Plan: His recent A1c is 7.7, indicating suboptimal control compared to previous levels of 4.8 and 5.2.  He is currently on metformin  and glimepiride , with occasional blood glucose readings up to 200 mg/dL. We discussed Trulicity  for liver protection and cardiovascular  benefits, but he prefers to avoid injectables due to cost and past adverse effects with Ozempic . It is crucial to maintain A1c under 7 to prevent liver complications. Continue metformin  [phone] mg orally twice daily and adjust glimepiride  to 2 mg in the morning and 2 mg at night. Trulicity  is prescribed as an option, but he may choose not to use it due to cost. A follow-up is scheduled in 3 months to reassess blood glucose control.  Orders: -     metFORMIN  HCl; Take 1-2 tablets (500-1,000 mg total) by mouth 2 (two) times daily. Taking 2 at night and 1 in the morning  Dispense: 360 tablet; Refill: 2 -     POCT glycosylated hemoglobin (Hb A1C) -     Glimepiride ; Take 0.5-1 tablets (2-4 mg total) by mouth daily.  Dispense: 90 tablet; Refill: 3 -     Trulicity ; Inject 0.75 mg into the skin once a week. Only take if affordable, and metformin /glimepiride  not controlling sugar.  Dispense: 2 mL; Refill: 1  PAF (paroxysmal atrial fibrillation) (HCC) Assessment & Plan: He has a history of syncope and is currently not on anticoagulation due to bleeding risk and history heavy bleeding. He reports occasional irregular heartbeats but no recent syncope. No syncopal symptom(s) since discontinued diltiazem    Onychomycosis -     Ciclopirox ; Apply topically at bedtime. Apply over nail and surrounding skin. Apply daily over previous coat. After seven (7) days, may remove with alcohol and continue cycle.  Dispense: 6.6 mL; Refill: 0

## 2024-10-04 NOTE — Assessment & Plan Note (Signed)
 He has liver cirrhosis secondary to NASH with a history of gastrointestinal bleeding, though current liver function appears stable. He should avoid alcohol and acetaminophen  due to liver sensitivity. We discussed the impact of elevated A1c on liver health and the potential benefits of Trulicity  for liver protection. Avoid alcohol and acetaminophen , monitor for signs of gastrointestinal bleeding, and consider Trulicity  for liver protection if affordable.

## 2024-10-05 ENCOUNTER — Inpatient Hospital Stay

## 2024-10-05 ENCOUNTER — Inpatient Hospital Stay: Attending: Hematology | Admitting: Oncology

## 2024-10-05 VITALS — BP 137/72 | HR 70 | Temp 97.8°F | Resp 18 | Wt 182.0 lb

## 2024-10-05 DIAGNOSIS — D469 Myelodysplastic syndrome, unspecified: Secondary | ICD-10-CM | POA: Diagnosis not present

## 2024-10-05 DIAGNOSIS — D500 Iron deficiency anemia secondary to blood loss (chronic): Secondary | ICD-10-CM | POA: Insufficient documentation

## 2024-10-05 DIAGNOSIS — K922 Gastrointestinal hemorrhage, unspecified: Secondary | ICD-10-CM | POA: Diagnosis present

## 2024-10-05 LAB — CBC WITH DIFFERENTIAL/PLATELET
Abs Immature Granulocytes: 0.04 K/uL (ref 0.00–0.07)
Basophils Absolute: 0 K/uL (ref 0.0–0.1)
Basophils Relative: 0 %
Eosinophils Absolute: 0.1 K/uL (ref 0.0–0.5)
Eosinophils Relative: 3 %
HCT: 34.1 % — ABNORMAL LOW (ref 39.0–52.0)
Hemoglobin: 11.4 g/dL — ABNORMAL LOW (ref 13.0–17.0)
Immature Granulocytes: 1 %
Lymphocytes Relative: 20 %
Lymphs Abs: 0.6 K/uL — ABNORMAL LOW (ref 0.7–4.0)
MCH: 30.7 pg (ref 26.0–34.0)
MCHC: 33.4 g/dL (ref 30.0–36.0)
MCV: 91.9 fL (ref 80.0–100.0)
Monocytes Absolute: 0.7 K/uL (ref 0.1–1.0)
Monocytes Relative: 25 %
Neutro Abs: 1.5 K/uL — ABNORMAL LOW (ref 1.7–7.7)
Neutrophils Relative %: 51 %
Platelets: 59 K/uL — ABNORMAL LOW (ref 150–400)
RBC: 3.71 MIL/uL — ABNORMAL LOW (ref 4.22–5.81)
RDW: 13.8 % (ref 11.5–15.5)
WBC: 2.9 K/uL — ABNORMAL LOW (ref 4.0–10.5)
nRBC: 0 % (ref 0.0–0.2)

## 2024-10-05 MED ORDER — LUSPATERCEPT-AAMT 75 MG ~~LOC~~ SOLR
1.0000 mg/kg | Freq: Once | SUBCUTANEOUS | Status: AC
  Administered 2024-10-05: 85 mg via SUBCUTANEOUS
  Filled 2024-10-05: qty 0.5

## 2024-10-05 NOTE — Progress Notes (Signed)
 Ok to proceed with Reblozyl  today per Randall Hope, NP. Pt will remain on Reblozyl  1mg /kg dose but extending tx interval to q 6 weeks for next appt. Tx date updated for next cycle.  Jianna Drabik, PharmD, MBA

## 2024-10-05 NOTE — Assessment & Plan Note (Signed)
-  No evidence of active bleed per patient, although suspect slow GI bleed from known AVMs.  He is followed closely by GI. -He was given 1 g of Monoferric  on 03/27/2023, 05/15/2023, 07/22/2023, 10/14/2023 and 04/05/2024. -He is currently taking iron supplements twice daily with good tolerance. -Will recheck iron levels in 6 weeks at his next visit.

## 2024-10-05 NOTE — Progress Notes (Signed)
 Patient presents today for Rebolyzl injection. Hemoglobin reviewed prior to administration. VSS tolerated without incident or complaint. See MAR for details. Patient stable during and after injection. Patient discharged in satisfactory condition with no s/s of distress noted.

## 2024-10-05 NOTE — Patient Instructions (Signed)
 [facility] [place] - A DEPT OF [place]  Discharge Instructions: Thank you for choosing [facility] to provide your oncology and hematology care.  If you have a lab appointment with the Cancer Center - please note that after April 8th, 2024, all labs will be drawn in the cancer center.  You do not have to check in or register with the main entrance as you have in the past but will complete your check-in in the cancer center.  Wear comfortable clothing and clothing appropriate for easy access to any Portacath or PICC line.   We strive to give you quality time with your provider. You may need to reschedule your appointment if you arrive late (15 or more minutes).  Arriving late affects you and other patients whose appointments are after yours.  Also, if you miss three or more appointments without notifying the office, you may be dismissed from the clinic at the providers discretion.      For prescription refill requests, have your pharmacy contact our office and allow 72 hours for refills to be completed.    Today you received the following Rebolyzl return as scheduled.    To help prevent nausea and vomiting after your treatment, we encourage you to take your nausea medication as directed.  BELOW ARE SYMPTOMS THAT SHOULD BE REPORTED IMMEDIATELY: *FEVER GREATER THAN 100.4 F (38 C) OR HIGHER *CHILLS OR SWEATING *NAUSEA AND VOMITING THAT IS NOT CONTROLLED WITH YOUR NAUSEA MEDICATION *UNUSUAL SHORTNESS OF BREATH *UNUSUAL BRUISING OR BLEEDING *URINARY PROBLEMS (pain or burning when urinating, or frequent urination) *BOWEL PROBLEMS (unusual diarrhea, constipation, pain near the anus) TENDERNESS IN MOUTH AND THROAT WITH OR WITHOUT PRESENCE OF ULCERS (sore throat, sores in mouth, or a toothache) UNUSUAL RASH, SWELLING OR PAIN  UNUSUAL VAGINAL DISCHARGE OR ITCHING   Items with * indicate a potential emergency and should be followed up as soon as possible or  go to the Emergency Department if any problems should occur.  Please show the CHEMOTHERAPY ALERT CARD or IMMUNOTHERAPY ALERT CARD at check-in to the Emergency Department and triage nurse.  Should you have questions after your visit or need to cancel or reschedule your appointment, please contact [facility] [place] - A DEPT OF [facility] [phone]  and follow the prompts.  Office hours are 8:00 a.m. to 4:30 p.m. Monday - Friday. Please note that voicemails left after 4:00 p.m. may not be returned until the following business day.  We are closed weekends and major holidays. You have access to a nurse at all times for urgent questions. Please call the main number to the clinic [phone] and follow the prompts.  For any non-urgent questions, you may also contact your provider using MyChart. We now offer e-Visits for anyone 75 and older to request care online for non-urgent symptoms. For details visit [email or web address].   Also download the MyChart app! Go to the app store, search MyChart, open the app, select , and log in with your MyChart username and password.

## 2024-10-05 NOTE — Progress Notes (Signed)
 "  [facility] OFFICE PROGRESS NOTE  Derek Bernardino MATSU, Derek Drake  ASSESSMENT & PLAN:    Assessment & Plan MDS (myelodysplastic syndrome) (HCC) Flow cytometry did not reveal any abnormal B or T-cell population.  Folate level was WNL and B12 was elevated.  Reticulocyte count is normal.   Bone marrow biopsy Sept 2025:  - No abnormal immature population identified  - No abnormal B or T-cell population identified    The patient's history of pancytopenia is noted.  Morphologic  evaluation reveals a hypercellular bone marrow with trilineage  hematopoiesis.  While mild dyspoiesis is noted, it does not reach the  threshold dysplasia.  Blasts are not increased.  Flow cytometric  analysis did not identify abnormal immature population, or a clonal B or  T-cell population.  A myeloid NGS panel may be requested if clinically  indicated.  Correlation with pending cytogenetics is recommended for  further assessment.     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 -EPO level elevated at 62.2 and LDH elevated. -He was started on luspatercept  every 3 weeks with significant improvement of his hemoglobin.  He actually did not need the last 2 doses because hemoglobin was greater than 11.5. -Most recent labs from 10/05/2024 shows hemoglobin of 11.4, platelets 59,000 white blood cell count 2.9.  ANC 1.5. -Last received luspatercept  on 08/03/2024. -Proceed with luspatercept  today. -Recommend increasing length of time between labs and possible injection to every 6 weeks instead of every 3 weeks. -Patient and wife are in agreement. -Return to clinic in 6 weeks with labs plus or minus luspatercept  based on results. -We will see him back in 12 weeks. Iron deficiency anemia due to chronic blood loss -No evidence of active bleed per patient, although suspect slow GI bleed from known AVMs.  He is followed closely by  GI. -He was given 1 g of Monoferric  on 03/27/2023, 05/15/2023, 07/22/2023, 10/14/2023 and 04/05/2024. -He is currently taking iron supplements twice daily with good tolerance. -Will recheck iron levels in 6 weeks at his next visit.  Orders Placed This Encounter  Procedures   Erythropoietin     Standing Status:   Future    Expected Date:   11/16/2024    Expiration Date:   02/14/2025   Lactate dehydrogenase    Standing Status:   Future    Expected Date:   11/16/2024    Expiration Date:   02/14/2025   Iron and TIBC ([facility] ONLY)    Standing Status:   Future    Expected Date:   11/16/2024    Expiration Date:   02/14/2025   Ferritin    Standing Status:   Future    Expected Date:   11/16/2024    Expiration Date:   02/14/2025   Comprehensive metabolic panel    Standing Status:   Future    Expected Date:   11/16/2024    Expiration Date:   02/14/2025   CBC with Differential    Standing Status:   Future    Expected Date:   11/16/2024    Expiration Date:   02/14/2025    INTERVAL HISTORY: Patient returns for follow-up after starting luspatercept  for MDS.  Patient was hospitalized from 07/09/2024 through 07/11/2024 for syncope and collapse.  Syncope thought to be due to bradycardia.  He also has history of paroxysmal atrial fibrillation.  He is followed by cardiology.  He is  not on anticoagulation due to history of GI bleeding due to AVM.  In the hospital, he was given IV fluids.   Since his hospitalization he was seen by cardiology on 08/05/2024 and appeared to be doing well.  No additional testing needed.  He denies any recurrent syncopal episodes.  Patient does have history of colonic AVMs and esophageal varices currently on nadolol  for prophylaxis.  Plan is to repeat endoscopy in 1 year.  He denies any visible blood in his stools.  Reports feeling much better since starting luspatercept .  Appetite 75% energy levels are 100%.  Denies any pain.   Reports tolerating luspatercept  well.  We  reviewed CBC, CMP, ferritin and iron panel.   SUMMARY OF HEMATOLOGIC HISTORY: Oncology History  MDS (myelodysplastic syndrome) (HCC)  07/06/2024 Initial Diagnosis   MDS (myelodysplastic syndrome) (HCC)   07/12/2024 -  Chemotherapy   Patient is on Treatment Plan : MYELODYSPLASIA Luspatercept  q21d        CBC    Component Value Date/Time   WBC 2.9 (L) 10/05/2024 1008   RBC 3.71 (L) 10/05/2024 1008   HGB 11.4 (L) 10/05/2024 1008   HGB 8.9 (L) 06/21/2024 1218   HCT 34.1 (L) 10/05/2024 1008   HCT 27.6 (L) 06/21/2024 1218   PLT 59 (L) 10/05/2024 1008   PLT 79 (LL) 06/21/2024 1218   MCV 91.9 10/05/2024 1008   MCV 102 (H) 06/21/2024 1218   MCH 30.7 10/05/2024 1008   MCHC 33.4 10/05/2024 1008   RDW 13.8 10/05/2024 1008   RDW 13.5 06/21/2024 1218   LYMPHSABS 0.6 (L) 10/05/2024 1008   LYMPHSABS 0.7 06/21/2024 1218   MONOABS 0.7 10/05/2024 1008   EOSABS 0.1 10/05/2024 1008   EOSABS 0.1 06/21/2024 1218   BASOSABS 0.0 10/05/2024 1008   BASOSABS 0.0 06/21/2024 1218       Latest Ref Rng & Units 08/24/2024    9:01 AM 08/02/2024   10:30 AM 07/11/2024    5:17 AM  CMP  Glucose 70 - 99 mg/dL 744  808  853   BUN 8 - 23 mg/dL 18  15  16    Creatinine 0.61 - 1.24 mg/dL 8.81  8.90  8.91   Sodium 135 - 145 mmol/L 134  137  140   Potassium 3.5 - 5.1 mmol/L 5.1  4.5  4.3   Chloride 98 - 111 mmol/L 98  101  106   CO2 22 - 32 mmol/L 22  28  25    Calcium  8.9 - 10.3 mg/dL 9.8  89.9  8.9   Total Protein 6.5 - 8.1 g/dL 6.6  6.7    Total Bilirubin 0.0 - 1.2 mg/dL 0.6  0.7    Alkaline Phos 38 - 126 U/L 87  92    AST 15 - 41 U/L 32  28    ALT 0 - 44 U/L 21  21       Lab Results  Component Value Date   FERRITIN 45 06/29/2024   VITAMINB12 1,326 (H) 02/05/2024    Vitals:   10/05/24 1128  BP: 137/72  Pulse: 70  Resp: 18  Temp: 97.8 F (36.6 C)  SpO2: 100%    Review of System:  Review of Systems  Constitutional:  Negative for malaise/fatigue and weight loss.  Genitourinary:   Positive for frequency.    Physical Exam: Physical Exam Constitutional:      Appearance: Normal appearance.  HENT:     Head: Normocephalic and atraumatic.  Eyes:     Pupils:  Pupils are equal, round, and reactive to light.  Cardiovascular:     Rate and Rhythm: Normal rate and regular rhythm.     Heart sounds: Normal heart sounds. No murmur heard. Pulmonary:     Effort: Pulmonary effort is normal.     Breath sounds: Normal breath sounds. No wheezing.  Abdominal:     General: Bowel sounds are normal. There is no distension.     Palpations: Abdomen is soft.     Tenderness: There is no abdominal tenderness.  Musculoskeletal:        General: Normal range of motion.     Cervical back: Normal range of motion.  Skin:    General: Skin is warm and dry.     Findings: No rash.  Neurological:     Mental Status: He is alert and oriented to person, place, and time.     Gait: Gait is intact.  Psychiatric:        Mood and Affect: Mood and affect normal.        Cognition and Memory: Memory normal.        Judgment: Judgment normal.      I spent 20 minutes dedicated to the care of this patient (face-to-face and non-face-to-face) on the date of the encounter to include what is described in the assessment and plan.,  Delon Hope, NP 10/05/2024 12:58 PM "

## 2024-10-05 NOTE — Assessment & Plan Note (Addendum)
 Flow cytometry did not reveal any abnormal B or T-cell population.  Folate level was WNL and B12 was elevated.  Reticulocyte count is normal.   Bone marrow biopsy Sept 2025:  - No abnormal immature population identified  - No abnormal B or T-cell population identified    The patient's history of pancytopenia is noted.  Morphologic  evaluation reveals a hypercellular bone marrow with trilineage  hematopoiesis.  While mild dyspoiesis is noted, it does not reach the  threshold dysplasia.  Blasts are not increased.  Flow cytometric  analysis did not identify abnormal immature population, or a clonal B or  T-cell population.  A myeloid NGS panel may be requested if clinically  indicated.  Correlation with pending cytogenetics is recommended for  further assessment.     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 -EPO level elevated at 62.2 and LDH elevated. -He was started on luspatercept  every 3 weeks with significant improvement of his hemoglobin.  He actually did not need the last 2 doses because hemoglobin was greater than 11.5. -Most recent labs from 10/05/2024 shows hemoglobin of 11.4, platelets 59,000 white blood cell count 2.9.  ANC 1.5. -Last received luspatercept  on 08/03/2024. -Proceed with luspatercept  today. -Recommend increasing length of time between labs and possible injection to every 6 weeks instead of every 3 weeks. -Patient and wife are in agreement. -Return to clinic in 6 weeks with labs plus or minus luspatercept  based on results. -We will see him back in 12 weeks.

## 2024-10-16 ENCOUNTER — Other Ambulatory Visit: Payer: Self-pay | Admitting: Internal Medicine

## 2024-10-16 DIAGNOSIS — N401 Enlarged prostate with lower urinary tract symptoms: Secondary | ICD-10-CM

## 2024-10-26 ENCOUNTER — Inpatient Hospital Stay

## 2024-11-16 ENCOUNTER — Inpatient Hospital Stay: Attending: Hematology | Admitting: Oncology

## 2024-11-16 ENCOUNTER — Inpatient Hospital Stay

## 2024-12-28 ENCOUNTER — Inpatient Hospital Stay

## 2024-12-28 ENCOUNTER — Inpatient Hospital Stay: Admitting: Oncology

## 2025-01-02 ENCOUNTER — Ambulatory Visit: Admitting: Internal Medicine

## 2025-01-12 ENCOUNTER — Ambulatory Visit: Admitting: Student

## 2025-09-21 ENCOUNTER — Ambulatory Visit
